# Patient Record
Sex: Female | Born: 1970 | Race: White | Hispanic: No | Marital: Single | Smoking: Never smoker
Health system: Southern US, Community
[De-identification: ages and names within clinical notes are randomized; demographics above are authoritative.]

## PROBLEM LIST (undated history)

## (undated) DIAGNOSIS — K5090 Crohn's disease, unspecified, without complications: Secondary | ICD-10-CM

## (undated) DIAGNOSIS — F329 Major depressive disorder, single episode, unspecified: Secondary | ICD-10-CM

## (undated) DIAGNOSIS — N179 Acute kidney failure, unspecified: Secondary | ICD-10-CM

## (undated) DIAGNOSIS — S8290XA Unspecified fracture of unspecified lower leg, initial encounter for closed fracture: Secondary | ICD-10-CM

## (undated) DIAGNOSIS — E039 Hypothyroidism, unspecified: Secondary | ICD-10-CM

## (undated) DIAGNOSIS — F209 Schizophrenia, unspecified: Secondary | ICD-10-CM

## (undated) DIAGNOSIS — S0101XA Laceration without foreign body of scalp, initial encounter: Secondary | ICD-10-CM

## (undated) DIAGNOSIS — I10 Essential (primary) hypertension: Secondary | ICD-10-CM

## (undated) DIAGNOSIS — E559 Vitamin D deficiency, unspecified: Secondary | ICD-10-CM

## (undated) DIAGNOSIS — E7220 Disorder of urea cycle metabolism, unspecified: Secondary | ICD-10-CM

## (undated) DIAGNOSIS — S069X9A Unspecified intracranial injury with loss of consciousness of unspecified duration, initial encounter: Secondary | ICD-10-CM

## (undated) DIAGNOSIS — E785 Hyperlipidemia, unspecified: Secondary | ICD-10-CM

## (undated) DIAGNOSIS — E28319 Asymptomatic premature menopause: Secondary | ICD-10-CM

## (undated) DIAGNOSIS — E079 Disorder of thyroid, unspecified: Secondary | ICD-10-CM

## (undated) DIAGNOSIS — Z1239 Encounter for other screening for malignant neoplasm of breast: Secondary | ICD-10-CM

## (undated) DIAGNOSIS — F203 Undifferentiated schizophrenia: Secondary | ICD-10-CM

## (undated) DIAGNOSIS — S2239XA Fracture of one rib, unspecified side, initial encounter for closed fracture: Secondary | ICD-10-CM

## (undated) DIAGNOSIS — F32A Depression, unspecified: Secondary | ICD-10-CM

## (undated) DIAGNOSIS — I959 Hypotension, unspecified: Secondary | ICD-10-CM

## (undated) DIAGNOSIS — G9340 Encephalopathy, unspecified: Secondary | ICD-10-CM

## (undated) DIAGNOSIS — L03119 Cellulitis of unspecified part of limb: Secondary | ICD-10-CM

## (undated) DIAGNOSIS — S069XAA Unspecified intracranial injury with loss of consciousness status unknown, initial encounter: Secondary | ICD-10-CM

## (undated) DIAGNOSIS — I9589 Other hypotension: Secondary | ICD-10-CM

## (undated) DIAGNOSIS — N6459 Other signs and symptoms in breast: Secondary | ICD-10-CM

## (undated) DIAGNOSIS — E669 Obesity, unspecified: Secondary | ICD-10-CM

## (undated) DIAGNOSIS — L0390 Cellulitis, unspecified: Secondary | ICD-10-CM

## (undated) DIAGNOSIS — H5789 Other specified disorders of eye and adnexa: Secondary | ICD-10-CM

## (undated) DIAGNOSIS — R928 Other abnormal and inconclusive findings on diagnostic imaging of breast: Secondary | ICD-10-CM

## (undated) DIAGNOSIS — R609 Edema, unspecified: Secondary | ICD-10-CM

## (undated) DIAGNOSIS — K219 Gastro-esophageal reflux disease without esophagitis: Secondary | ICD-10-CM

## (undated) DIAGNOSIS — K449 Diaphragmatic hernia without obstruction or gangrene: Secondary | ICD-10-CM

## (undated) DIAGNOSIS — E119 Type 2 diabetes mellitus without complications: Secondary | ICD-10-CM

## (undated) HISTORY — DX: Crohn's disease, unspecified, without complications: K50.90

## (undated) HISTORY — DX: Disorder of thyroid, unspecified: E07.9

## (undated) HISTORY — PX: TUMOR EXCISION: SHX421

## (undated) HISTORY — DX: Other abnormal and inconclusive findings on diagnostic imaging of breast: R92.8

## (undated) HISTORY — DX: Cellulitis, unspecified: L03.90

## (undated) HISTORY — DX: Depression, unspecified: F32.A

## (undated) HISTORY — DX: Encounter for other screening for malignant neoplasm of breast: Z12.39

## (undated) HISTORY — DX: Obesity, unspecified: E66.9

## (undated) HISTORY — DX: Acute kidney failure, unspecified: N17.9

## (undated) HISTORY — DX: Unspecified fracture of unspecified lower leg, initial encounter for closed fracture: S82.90XA

## (undated) HISTORY — DX: Encephalopathy, unspecified: G93.40

## (undated) HISTORY — DX: Gastro-esophageal reflux disease without esophagitis: K21.9

## (undated) HISTORY — DX: Diaphragmatic hernia without obstruction or gangrene: K44.9

## (undated) HISTORY — DX: Fracture of one rib, unspecified side, initial encounter for closed fracture: S22.39XA

## (undated) HISTORY — DX: Type 2 diabetes mellitus without complications: E11.9

## (undated) HISTORY — DX: Essential (primary) hypertension: I10

## (undated) HISTORY — DX: Edema, unspecified: R60.9

## (undated) HISTORY — DX: Disorder of urea cycle metabolism, unspecified: E72.20

## (undated) HISTORY — DX: Hyperlipidemia, unspecified: E78.5

## (undated) HISTORY — DX: Undifferentiated schizophrenia: F20.3

## (undated) HISTORY — DX: Laceration without foreign body of scalp, initial encounter: S01.01XA

## (undated) HISTORY — DX: Hypotension, unspecified: I95.9

## (undated) HISTORY — DX: Major depressive disorder, single episode, unspecified: F32.9

## (undated) HISTORY — DX: Other signs and symptoms in breast: N64.59

## (undated) HISTORY — DX: Other specified disorders of eye and adnexa: H57.89

## (undated) HISTORY — DX: Vitamin D deficiency, unspecified: E55.9

---
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
## 2011-05-11 ENCOUNTER — Ambulatory Visit: Payer: Medicare Other | Attending: Family Medicine | Admitting: Sleep Medicine

## 2011-05-11 DIAGNOSIS — Z6841 Body Mass Index (BMI) 40.0 and over, adult: Secondary | ICD-10-CM | POA: Insufficient documentation

## 2011-05-11 DIAGNOSIS — G4733 Obstructive sleep apnea (adult) (pediatric): Secondary | ICD-10-CM | POA: Insufficient documentation

## 2011-05-11 DIAGNOSIS — G4700 Insomnia, unspecified: Secondary | ICD-10-CM

## 2011-05-14 NOTE — Procedures (Signed)
NAMESHADELL, BRENN               ACCOUNT NO.:  [id number]  MEDICAL RECORD NO.:  [id number]          PATIENT TYPE:  OUT  LOCATION:  SLEEP LAB                     FACILITY:  [facility]  PHYSICIAN:  Anavey Coombes A. Gerilyn Pilgrim, M.D. DATE OF BIRTH:  [birth year]  DATE OF STUDY:  05/11/2011                           NOCTURNAL POLYSOMNOGRAM  REFERRING PHYSICIAN:  MARILYN WADE  CSN number [id number].  INDICATION FOR STUDY:  A 40 year old who presents with hypersomnia, obesity, and snoring.  Study has been done to evaluate for obstructive sleep apnea syndrome.  EPWORTH SLEEPINESS SCORE: 1. BMI 51.  MEDICATIONS:  Asacol, Celexa, Clozapine, Cymbalta, flax seed oil, levothyroxine, metformin, TriCor, simvastatin, spironolactone, Flagyl, potassium, vitamin B12 injections, furosemide, vitamin D, Atrovent, and ibuprofen.  SLEEP ARCHITECTURE:  The total recording time is 435 minutes, sleep efficiency 94% sleep latency 30  minutes.  REM latency 114 minutes. Stage N1 4.8%, N2 73.7%, N3 1.2%.  REM sleep 20%.  RESPIRATORY DATA:  Baseline oxygen saturation 96, lowest saturation 69 during REM sleep.  The diagnostic AH I is 12 and RDI 12 with the events occurring almost exclusively during REM sleep.  The REM AHI is 51.  CARDIAC DATA:  Average heart rate 84 with no significant dysrhythmias observed.  MOVEMENT-PARASOMNIA:  PLM index 3.2.  IMPRESSIONS-RECOMMENDATIONS:  Moderate obstructive sleep apnea syndrome.  Although the patient's index is in the mild to moderate range.  She has severe desaturations down to 69 and should be setup for formal CPAP titration study.  Thanks for this referral.     Jedediah Noda A. Gerilyn Pilgrim, M.D.    KAD/MEDQ  D:  05/13/2011 18:05:36  T:  05/14/2011 04:46:09  Job:  [id number]

## 2011-06-20 ENCOUNTER — Other Ambulatory Visit (HOSPITAL_COMMUNITY)
Admission: RE | Admit: 2011-06-20 | Discharge: 2011-06-20 | Disposition: A | Discharge status: Home / Self Care | Payer: Medicare Other | Source: Ambulatory Visit | Attending: Obstetrics & Gynecology | Admitting: Obstetrics & Gynecology

## 2011-06-20 DIAGNOSIS — Z124 Encounter for screening for malignant neoplasm of cervix: Secondary | ICD-10-CM | POA: Insufficient documentation

## 2011-11-01 DIAGNOSIS — E669 Obesity, unspecified: Secondary | ICD-10-CM

## 2011-11-01 DIAGNOSIS — R928 Other abnormal and inconclusive findings on diagnostic imaging of breast: Secondary | ICD-10-CM

## 2011-11-01 DIAGNOSIS — K5090 Crohn's disease, unspecified, without complications: Secondary | ICD-10-CM

## 2011-11-01 DIAGNOSIS — K449 Diaphragmatic hernia without obstruction or gangrene: Secondary | ICD-10-CM

## 2011-11-01 DIAGNOSIS — N6459 Other signs and symptoms in breast: Secondary | ICD-10-CM

## 2011-11-01 DIAGNOSIS — Z1239 Encounter for other screening for malignant neoplasm of breast: Secondary | ICD-10-CM

## 2011-11-01 HISTORY — DX: Crohn's disease, unspecified, without complications: K50.90

## 2011-11-01 HISTORY — PX: EYE SURGERY: SHX253

## 2011-11-01 HISTORY — DX: Diaphragmatic hernia without obstruction or gangrene: K44.9

## 2011-11-01 HISTORY — DX: Obesity, unspecified: E66.9

## 2011-11-01 HISTORY — DX: Other signs and symptoms in breast: N64.59

## 2011-11-01 HISTORY — DX: Encounter for other screening for malignant neoplasm of breast: Z12.39

## 2011-11-01 HISTORY — DX: Other abnormal and inconclusive findings on diagnostic imaging of breast: R92.8

## 2012-01-12 ENCOUNTER — Ambulatory Visit: Payer: Self-pay | Admitting: Ophthalmology

## 2012-01-12 LAB — HEMOGLOBIN: HGB: 12.6 g/dL (ref 12.0–16.0)

## 2012-01-23 ENCOUNTER — Ambulatory Visit: Payer: Self-pay | Admitting: Ophthalmology

## 2012-03-06 ENCOUNTER — Ambulatory Visit: Payer: Self-pay | Admitting: Geriatric Medicine

## 2012-03-07 ENCOUNTER — Other Ambulatory Visit: Payer: Self-pay | Admitting: Gastroenterology

## 2012-03-07 LAB — CLOSTRIDIUM DIFFICILE BY PCR

## 2012-03-09 ENCOUNTER — Ambulatory Visit: Payer: Self-pay | Admitting: Ophthalmology

## 2012-03-14 ENCOUNTER — Ambulatory Visit: Payer: Self-pay | Admitting: Gastroenterology

## 2012-03-15 ENCOUNTER — Inpatient Hospital Stay: Payer: Self-pay | Admitting: Internal Medicine

## 2012-03-15 LAB — CBC WITH DIFFERENTIAL/PLATELET
Basophil #: 0.2 10*3/uL — ABNORMAL HIGH (ref 0.0–0.1)
Basophil %: 0.8 %
Eosinophil #: 0 10*3/uL (ref 0.0–0.7)
HCT: 37.3 % (ref 35.0–47.0)
HGB: 11.2 g/dL — ABNORMAL LOW (ref 12.0–16.0)
Lymphocyte #: 6.1 10*3/uL — ABNORMAL HIGH (ref 1.0–3.6)
MCH: 26.9 pg (ref 26.0–34.0)
MCHC: 30.1 g/dL — ABNORMAL LOW (ref 32.0–36.0)
MCV: 89 fL (ref 80–100)
Monocyte #: 1.3 x10 3/mm — ABNORMAL HIGH (ref 0.2–0.9)
Monocyte %: 6.7 %
Neutrophil #: 11.8 10*3/uL — ABNORMAL HIGH (ref 1.4–6.5)
Platelet: 551 10*3/uL — ABNORMAL HIGH (ref 150–440)
RBC: 4.17 10*6/uL (ref 3.80–5.20)
RDW: 17 % — ABNORMAL HIGH (ref 11.5–14.5)
WBC: 19.3 10*3/uL — ABNORMAL HIGH (ref 3.6–11.0)

## 2012-03-15 LAB — COMPREHENSIVE METABOLIC PANEL
Albumin: 2.5 g/dL — ABNORMAL LOW (ref 3.4–5.0)
Anion Gap: 10 (ref 7–16)
BUN: 3 mg/dL — ABNORMAL LOW (ref 7–18)
EGFR (Non-African Amer.): 55 — ABNORMAL LOW
Glucose: 107 mg/dL — ABNORMAL HIGH (ref 65–99)
Osmolality: 276 (ref 275–301)
Potassium: 3.6 mmol/L (ref 3.5–5.1)
SGOT(AST): 35 U/L (ref 15–37)

## 2012-03-15 LAB — HEMOGLOBIN A1C: Hemoglobin A1C: 6.5 % — ABNORMAL HIGH (ref 4.2–6.3)

## 2012-03-15 LAB — TSH: Thyroid Stimulating Horm: 4.67 u[IU]/mL — ABNORMAL HIGH

## 2012-03-16 LAB — CBC WITH DIFFERENTIAL/PLATELET
Basophil %: 0.5 %
Eosinophil %: 0 %
HCT: 36.3 % (ref 35.0–47.0)
Lymphocyte #: 2.5 10*3/uL (ref 1.0–3.6)
MCH: 27.2 pg (ref 26.0–34.0)
MCHC: 30.4 g/dL — ABNORMAL LOW (ref 32.0–36.0)
Monocyte #: 0.2 x10 3/mm (ref 0.2–0.9)
Neutrophil #: 10.3 10*3/uL — ABNORMAL HIGH (ref 1.4–6.5)
Neutrophil %: 79 %
RBC: 4.04 10*6/uL (ref 3.80–5.20)
RDW: 16.8 % — ABNORMAL HIGH (ref 11.5–14.5)
WBC: 13 10*3/uL — ABNORMAL HIGH (ref 3.6–11.0)

## 2012-03-16 LAB — URINALYSIS, COMPLETE
Bilirubin,UR: NEGATIVE
Blood: NEGATIVE
Glucose,UR: NEGATIVE mg/dL (ref 0–75)
Ketone: NEGATIVE
Leukocyte Esterase: NEGATIVE
Nitrite: NEGATIVE
RBC,UR: NONE SEEN /HPF (ref 0–5)
Specific Gravity: 1.002 (ref 1.003–1.030)
Squamous Epithelial: 1
WBC UR: 1 /HPF (ref 0–5)

## 2012-03-16 LAB — BASIC METABOLIC PANEL
Anion Gap: 8 (ref 7–16)
BUN: 3 mg/dL — ABNORMAL LOW (ref 7–18)
Calcium, Total: 7.5 mg/dL — ABNORMAL LOW (ref 8.5–10.1)
Co2: 28 mmol/L (ref 21–32)
EGFR (African American): >60
Glucose: 136 mg/dL — ABNORMAL HIGH (ref 65–99)
Osmolality: 280 (ref 275–301)
Potassium: 3.7 mmol/L (ref 3.5–5.1)

## 2012-03-16 LAB — MAGNESIUM: Magnesium: 1.8 mg/dL

## 2012-03-17 LAB — BASIC METABOLIC PANEL
Anion Gap: 10 (ref 7–16)
Calcium, Total: 7.2 mg/dL — ABNORMAL LOW (ref 8.5–10.1)
Chloride: 112 mmol/L — ABNORMAL HIGH (ref 98–107)
Co2: 26 mmol/L (ref 21–32)
Creatinine: 0.86 mg/dL (ref 0.60–1.30)
EGFR (African American): >60
EGFR (Non-African Amer.): >60
Glucose: 154 mg/dL — ABNORMAL HIGH (ref 65–99)
Potassium: 3.5 mmol/L (ref 3.5–5.1)
Sodium: 148 mmol/L — ABNORMAL HIGH (ref 136–145)

## 2012-03-17 LAB — CBC WITH DIFFERENTIAL/PLATELET
Basophil %: 0.1 %
HCT: 32.6 % — ABNORMAL LOW (ref 35.0–47.0)
Lymphocyte #: 2.6 10*3/uL (ref 1.0–3.6)
Lymphocyte %: 18.5 %
MCH: 27.9 pg (ref 26.0–34.0)
MCHC: 31.5 g/dL — ABNORMAL LOW (ref 32.0–36.0)
MCV: 89 fL (ref 80–100)
Monocyte %: 3.7 %
Neutrophil #: 10.9 10*3/uL — ABNORMAL HIGH (ref 1.4–6.5)
Neutrophil %: 77.7 %
Platelet: 543 10*3/uL — ABNORMAL HIGH (ref 150–440)
RBC: 3.67 10*6/uL — ABNORMAL LOW (ref 3.80–5.20)
WBC: 14.1 10*3/uL — ABNORMAL HIGH (ref 3.6–11.0)

## 2012-03-18 LAB — CBC WITH DIFFERENTIAL/PLATELET
Basophil #: 0 10*3/uL (ref 0.0–0.1)
Basophil %: 0.1 %
Eosinophil #: 0 10*3/uL (ref 0.0–0.7)
Eosinophil %: 0 %
HGB: 10.8 g/dL — ABNORMAL LOW (ref 12.0–16.0)
Lymphocyte #: 3.4 10*3/uL (ref 1.0–3.6)
Lymphocyte %: 21.7 %
MCH: 28.5 pg (ref 26.0–34.0)
MCHC: 31.9 g/dL — ABNORMAL LOW (ref 32.0–36.0)
Monocyte #: 0.5 x10 3/mm (ref 0.2–0.9)
Monocyte %: 3.4 %
Neutrophil %: 74.8 %
RDW: 17 % — ABNORMAL HIGH (ref 11.5–14.5)
WBC: 15.5 10*3/uL — ABNORMAL HIGH (ref 3.6–11.0)

## 2012-03-19 LAB — BASIC METABOLIC PANEL
BUN: 13 mg/dL (ref 7–18)
Calcium, Total: 7.5 mg/dL — ABNORMAL LOW (ref 8.5–10.1)
Chloride: 112 mmol/L — ABNORMAL HIGH (ref 98–107)
Co2: 26 mmol/L (ref 21–32)
Creatinine: 0.99 mg/dL (ref 0.60–1.30)
EGFR (African American): >60
EGFR (Non-African Amer.): >60

## 2012-03-19 LAB — PATHOLOGY REPORT

## 2012-03-20 LAB — CBC WITH DIFFERENTIAL/PLATELET
Basophil %: 0 %
Eosinophil #: 0 10*3/uL (ref 0.0–0.7)
Eosinophil %: 0 %
HCT: 38 % (ref 35.0–47.0)
HGB: 11.7 g/dL — ABNORMAL LOW (ref 12.0–16.0)
Lymphocyte #: 5.2 10*3/uL — ABNORMAL HIGH (ref 1.0–3.6)
Lymphocyte %: 30.3 %
MCH: 27.5 pg (ref 26.0–34.0)
MCHC: 30.7 g/dL — ABNORMAL LOW (ref 32.0–36.0)
MCV: 90 fL (ref 80–100)
Monocyte %: 8.1 %
Neutrophil #: 10.5 10*3/uL — ABNORMAL HIGH (ref 1.4–6.5)
Platelet: 549 10*3/uL — ABNORMAL HIGH (ref 150–440)
RBC: 4.24 10*6/uL (ref 3.80–5.20)
WBC: 17 10*3/uL — ABNORMAL HIGH (ref 3.6–11.0)

## 2012-03-20 LAB — BASIC METABOLIC PANEL
BUN: 17 mg/dL (ref 7–18)
Chloride: 108 mmol/L — ABNORMAL HIGH (ref 98–107)
EGFR (Non-African Amer.): >60
Potassium: 3.9 mmol/L (ref 3.5–5.1)
Sodium: 144 mmol/L (ref 136–145)

## 2012-03-28 ENCOUNTER — Ambulatory Visit: Payer: Self-pay | Admitting: Geriatric Medicine

## 2012-05-24 ENCOUNTER — Ambulatory Visit: Payer: Self-pay | Admitting: Ophthalmology

## 2012-06-04 ENCOUNTER — Ambulatory Visit: Payer: Self-pay | Admitting: Ophthalmology

## 2012-07-27 ENCOUNTER — Inpatient Hospital Stay: Payer: Self-pay

## 2012-07-27 LAB — URINALYSIS, COMPLETE
Bilirubin,UR: NEGATIVE
Blood: NEGATIVE
Granular Cast: 179
Hyaline Cast: 39
Nitrite: NEGATIVE
Ph: 5 (ref 4.5–8.0)
Protein: 30
Specific Gravity: 1.025 (ref 1.003–1.030)
Squamous Epithelial: 8
WBC UR: 10 /HPF (ref 0–5)

## 2012-07-27 LAB — CBC
HCT: 42.8 % (ref 35.0–47.0)
HGB: 13.4 g/dL (ref 12.0–16.0)
MCV: 95 fL (ref 80–100)
RBC: 4.51 10*6/uL (ref 3.80–5.20)

## 2012-07-27 LAB — PRO B NATRIURETIC PEPTIDE: B-Type Natriuretic Peptide: 389 pg/mL — ABNORMAL HIGH (ref 0–125)

## 2012-07-27 LAB — COMPREHENSIVE METABOLIC PANEL
Anion Gap: 17 — ABNORMAL HIGH (ref 7–16)
Bilirubin,Total: 0.5 mg/dL (ref 0.2–1.0)
Calcium, Total: 9.5 mg/dL (ref 8.5–10.1)
Chloride: 104 mmol/L (ref 98–107)
Co2: 21 mmol/L (ref 21–32)
Creatinine: 1.63 mg/dL — ABNORMAL HIGH (ref 0.60–1.30)
EGFR (African American): 45 — ABNORMAL LOW
Osmolality: 282 (ref 275–301)
SGOT(AST): 34 U/L (ref 15–37)
SGPT (ALT): 33 U/L (ref 12–78)
Sodium: 142 mmol/L (ref 136–145)

## 2012-07-27 LAB — ACETAMINOPHEN LEVEL: Acetaminophen: <2 ug/mL

## 2012-07-27 LAB — DRUG SCREEN, URINE
Benzodiazepine, Ur Scrn: NEGATIVE (ref ?–200)
Cocaine Metabolite,Ur ~~LOC~~: NEGATIVE (ref ?–300)
Phencyclidine (PCP) Ur S: NEGATIVE (ref ?–25)
Tricyclic, Ur Screen: NEGATIVE (ref ?–1000)

## 2012-07-27 LAB — TROPONIN I: Troponin-I: <0.02 ng/mL

## 2012-07-27 LAB — ETHANOL: Ethanol: <3 mg/dL

## 2012-07-27 LAB — SALICYLATE LEVEL: Salicylates, Serum: <1.7 mg/dL

## 2012-07-28 LAB — COMPREHENSIVE METABOLIC PANEL
Alkaline Phosphatase: 56 U/L (ref 50–136)
Anion Gap: 13 (ref 7–16)
Calcium, Total: 8.5 mg/dL (ref 8.5–10.1)
Co2: 26 mmol/L (ref 21–32)
EGFR (Non-African Amer.): 47 — ABNORMAL LOW
Osmolality: 281 (ref 275–301)
Potassium: 3.6 mmol/L (ref 3.5–5.1)
SGPT (ALT): 25 U/L (ref 12–78)
Sodium: 142 mmol/L (ref 136–145)

## 2012-07-28 LAB — CBC WITH DIFFERENTIAL/PLATELET
Eosinophil #: 0.1 10*3/uL (ref 0.0–0.7)
Eosinophil %: 0.8 %
Lymphocyte #: 6.9 10*3/uL — ABNORMAL HIGH (ref 1.0–3.6)
Lymphocyte %: 47.2 %
MCH: 30.7 pg (ref 26.0–34.0)
MCV: 95 fL (ref 80–100)
Monocyte #: 1.4 x10 3/mm — ABNORMAL HIGH (ref 0.2–0.9)
Neutrophil #: 6.1 10*3/uL (ref 1.4–6.5)
Platelet: 416 10*3/uL (ref 150–440)
RDW: 14.4 % (ref 11.5–14.5)
WBC: 14.5 10*3/uL — ABNORMAL HIGH (ref 3.6–11.0)

## 2012-07-28 LAB — TROPONIN I: Troponin-I: <0.02 ng/mL

## 2012-07-29 LAB — BASIC METABOLIC PANEL
Chloride: 102 mmol/L (ref 98–107)
Co2: 27 mmol/L (ref 21–32)
Creatinine: 1.07 mg/dL (ref 0.60–1.30)
Potassium: 3.7 mmol/L (ref 3.5–5.1)
Sodium: 142 mmol/L (ref 136–145)

## 2012-07-29 LAB — CBC WITH DIFFERENTIAL/PLATELET
Eosinophil #: 0.1 10*3/uL (ref 0.0–0.7)
Eosinophil %: 0.6 %
MCH: 30.8 pg (ref 26.0–34.0)
MCHC: 32.4 g/dL (ref 32.0–36.0)
MCV: 95 fL (ref 80–100)
Neutrophil #: 6.1 10*3/uL (ref 1.4–6.5)
Neutrophil %: 51.2 %
Platelet: 403 10*3/uL (ref 150–440)
RDW: 14.3 % (ref 11.5–14.5)

## 2012-07-30 LAB — CBC WITH DIFFERENTIAL/PLATELET
Basophil #: 0.1 10*3/uL (ref 0.0–0.1)
Basophil %: 0.6 %
Eosinophil #: 0.1 10*3/uL (ref 0.0–0.7)
HCT: 36.6 % (ref 35.0–47.0)
HGB: 11.7 g/dL — ABNORMAL LOW (ref 12.0–16.0)
Lymphocyte #: 4.8 10*3/uL — ABNORMAL HIGH (ref 1.0–3.6)
Lymphocyte %: 45.5 %
MCH: 30.4 pg (ref 26.0–34.0)
MCHC: 32 g/dL (ref 32.0–36.0)
Neutrophil #: 4.6 10*3/uL (ref 1.4–6.5)
Neutrophil %: 43.7 %
RDW: 14.1 % (ref 11.5–14.5)

## 2012-07-30 LAB — BASIC METABOLIC PANEL
Anion Gap: 15 (ref 7–16)
BUN: 4 mg/dL — ABNORMAL LOW (ref 7–18)
Calcium, Total: 9 mg/dL (ref 8.5–10.1)
Co2: 26 mmol/L (ref 21–32)
Creatinine: 1.11 mg/dL (ref 0.60–1.30)
EGFR (African American): >60
Potassium: 3.6 mmol/L (ref 3.5–5.1)
Sodium: 140 mmol/L (ref 136–145)

## 2012-07-31 LAB — CBC WITH DIFFERENTIAL/PLATELET
Basophil #: 0.1 10*3/uL (ref 0.0–0.1)
Basophil %: 0.8 %
Eosinophil #: 0.1 10*3/uL (ref 0.0–0.7)
HCT: 34.8 % — ABNORMAL LOW (ref 35.0–47.0)
Lymphocyte #: 4.4 10*3/uL — ABNORMAL HIGH (ref 1.0–3.6)
MCH: 31.8 pg (ref 26.0–34.0)
MCHC: 33.6 g/dL (ref 32.0–36.0)
MCV: 95 fL (ref 80–100)
Monocyte #: 0.9 x10 3/mm (ref 0.2–0.9)
Monocyte %: 8.6 %
Neutrophil #: 5.3 10*3/uL (ref 1.4–6.5)
RDW: 13.6 % (ref 11.5–14.5)
WBC: 10.9 10*3/uL (ref 3.6–11.0)

## 2012-07-31 LAB — BASIC METABOLIC PANEL
BUN: 3 mg/dL — ABNORMAL LOW (ref 7–18)
Calcium, Total: 9.2 mg/dL (ref 8.5–10.1)
Chloride: 102 mmol/L (ref 98–107)
Creatinine: 0.98 mg/dL (ref 0.60–1.30)
EGFR (African American): >60
EGFR (Non-African Amer.): >60
Glucose: 73 mg/dL (ref 65–99)
Potassium: 3.5 mmol/L (ref 3.5–5.1)

## 2012-08-01 LAB — WOUND AEROBIC CULTURE

## 2012-08-02 LAB — CULTURE, BLOOD (SINGLE)

## 2012-08-04 LAB — BASIC METABOLIC PANEL
Anion Gap: 13 (ref 7–16)
BUN: 3 mg/dL — ABNORMAL LOW (ref 7–18)
Calcium, Total: 8.7 mg/dL (ref 8.5–10.1)
Chloride: 101 mmol/L (ref 98–107)
Co2: 28 mmol/L (ref 21–32)
Creatinine: 1.09 mg/dL (ref 0.60–1.30)
EGFR (African American): >60
EGFR (Non-African Amer.): >60
Glucose: 83 mg/dL (ref 65–99)
Osmolality: 279 (ref 275–301)
Potassium: 3.2 mmol/L — ABNORMAL LOW (ref 3.5–5.1)
Sodium: 142 mmol/L (ref 136–145)

## 2012-08-05 LAB — CREATININE, SERUM
Creatinine: 1.15 mg/dL (ref 0.60–1.30)
EGFR (African American): >60
EGFR (Non-African Amer.): 59 — ABNORMAL LOW

## 2012-08-06 LAB — BASIC METABOLIC PANEL
Anion Gap: 9 (ref 7–16)
BUN: 2 mg/dL — ABNORMAL LOW (ref 7–18)
Calcium, Total: 9.8 mg/dL (ref 8.5–10.1)
Chloride: 102 mmol/L (ref 98–107)
Co2: 31 mmol/L (ref 21–32)
EGFR (African American): >60
EGFR (Non-African Amer.): >60
Osmolality: 280 (ref 275–301)
Potassium: 3.3 mmol/L — ABNORMAL LOW (ref 3.5–5.1)

## 2012-08-07 LAB — DIFFERENTIAL
Lymphocytes: 33 %
Variant Lymphocyte - H1-Rlymph: 34 %

## 2012-08-07 LAB — WBC: WBC: 14.4 10*3/uL — ABNORMAL HIGH (ref 3.6–11.0)

## 2012-08-08 LAB — CBC WITH DIFFERENTIAL/PLATELET
Basophil #: 0.3 10*3/uL — ABNORMAL HIGH (ref 0.0–0.1)
Basophil %: 2.7 %
Eosinophil #: 0.4 10*3/uL (ref 0.0–0.7)
Eosinophil %: 3.1 %
HCT: 34.9 % — ABNORMAL LOW (ref 35.0–47.0)
HGB: 11.5 g/dL — ABNORMAL LOW (ref 12.0–16.0)
MCH: 30.8 pg (ref 26.0–34.0)
MCHC: 32.8 g/dL (ref 32.0–36.0)
MCV: 94 fL (ref 80–100)
Monocyte #: 1.1 x10 3/mm — ABNORMAL HIGH (ref 0.2–0.9)
Neutrophil %: 40.2 %
RBC: 3.73 10*6/uL — ABNORMAL LOW (ref 3.80–5.20)
RDW: 14.1 % (ref 11.5–14.5)

## 2012-08-08 LAB — BASIC METABOLIC PANEL
Anion Gap: 7 (ref 7–16)
Calcium, Total: 9.4 mg/dL (ref 8.5–10.1)
Chloride: 100 mmol/L (ref 98–107)
Co2: 34 mmol/L — ABNORMAL HIGH (ref 21–32)
Creatinine: 0.97 mg/dL (ref 0.60–1.30)
EGFR (African American): >60
EGFR (Non-African Amer.): >60
Glucose: 101 mg/dL — ABNORMAL HIGH (ref 65–99)
Osmolality: 278 (ref 275–301)
Potassium: 3.5 mmol/L (ref 3.5–5.1)
Sodium: 141 mmol/L (ref 136–145)

## 2012-08-12 LAB — CBC WITH DIFFERENTIAL/PLATELET
Eosinophil %: 1.5 %
HCT: 34.9 % — ABNORMAL LOW (ref 35.0–47.0)
HGB: 11.1 g/dL — ABNORMAL LOW (ref 12.0–16.0)
Lymphocyte #: 5.7 10*3/uL — ABNORMAL HIGH (ref 1.0–3.6)
Lymphocyte %: 45.7 %
MCH: 29.6 pg (ref 26.0–34.0)
MCV: 93 fL (ref 80–100)
Monocyte %: 11.2 %
Neutrophil #: 5 10*3/uL (ref 1.4–6.5)
Platelet: 489 10*3/uL — ABNORMAL HIGH (ref 150–440)
RBC: 3.75 10*6/uL — ABNORMAL LOW (ref 3.80–5.20)
WBC: 12.4 10*3/uL — ABNORMAL HIGH (ref 3.6–11.0)

## 2012-08-12 LAB — BASIC METABOLIC PANEL
Anion Gap: 10 (ref 7–16)
Calcium, Total: 9.3 mg/dL (ref 8.5–10.1)
Chloride: 98 mmol/L (ref 98–107)
Co2: 32 mmol/L (ref 21–32)
EGFR (African American): >60
Glucose: 92 mg/dL (ref 65–99)
Potassium: 3.1 mmol/L — ABNORMAL LOW (ref 3.5–5.1)
Sodium: 140 mmol/L (ref 136–145)

## 2012-08-13 LAB — BASIC METABOLIC PANEL
Anion Gap: 10 (ref 7–16)
BUN: 3 mg/dL — ABNORMAL LOW (ref 7–18)
Chloride: 102 mmol/L (ref 98–107)
Creatinine: 0.82 mg/dL (ref 0.60–1.30)
EGFR (African American): >60
Glucose: 109 mg/dL — ABNORMAL HIGH (ref 65–99)
Osmolality: 282 (ref 275–301)

## 2012-08-23 ENCOUNTER — Other Ambulatory Visit: Payer: Self-pay | Admitting: Geriatric Medicine

## 2012-08-23 LAB — CBC WITH DIFFERENTIAL/PLATELET
Basophil #: 0.2 10*3/uL — ABNORMAL HIGH (ref 0.0–0.1)
Basophil %: 1.2 %
Eosinophil %: 1.5 %
HGB: 11.3 g/dL — ABNORMAL LOW (ref 12.0–16.0)
Lymphocyte #: 6.4 10*3/uL — ABNORMAL HIGH (ref 1.0–3.6)
Lymphocyte %: 48.2 %
MCH: 29.5 pg (ref 26.0–34.0)
MCV: 92 fL (ref 80–100)
Monocyte #: 1.3 x10 3/mm — ABNORMAL HIGH (ref 0.2–0.9)
Neutrophil %: 39.6 %
RBC: 3.82 10*6/uL (ref 3.80–5.20)

## 2012-09-18 ENCOUNTER — Ambulatory Visit: Payer: Self-pay | Admitting: General Surgery

## 2012-10-25 ENCOUNTER — Other Ambulatory Visit: Payer: Self-pay | Admitting: Geriatric Medicine

## 2012-10-25 LAB — CBC WITH DIFFERENTIAL/PLATELET
Basophil #: 0.3 10*3/uL — ABNORMAL HIGH (ref 0.0–0.1)
Eosinophil #: 0.2 10*3/uL (ref 0.0–0.7)
HCT: 42.3 % (ref 35.0–47.0)
HGB: 13.4 g/dL (ref 12.0–16.0)
Lymphocyte #: 7.2 10*3/uL — ABNORMAL HIGH (ref 1.0–3.6)
Lymphocyte %: 52.7 %
MCV: 89 fL (ref 80–100)
Monocyte %: 6.8 %
Neutrophil #: 5 10*3/uL (ref 1.4–6.5)
Neutrophil %: 36.4 %
Platelet: 370 10*3/uL (ref 150–440)
RDW: 16.3 % — ABNORMAL HIGH (ref 11.5–14.5)

## 2012-11-22 ENCOUNTER — Other Ambulatory Visit: Payer: Self-pay | Admitting: Geriatric Medicine

## 2012-11-22 LAB — CBC WITH DIFFERENTIAL/PLATELET
Comment - H1-Com2: NORMAL
Eosinophil: 1 %
MCH: 29.9 pg (ref 26.0–34.0)
MCHC: 33.1 g/dL (ref 32.0–36.0)
Monocytes: 8 %
RBC: 4.7 10*6/uL (ref 3.80–5.20)
RDW: 16 % — ABNORMAL HIGH (ref 11.5–14.5)
Variant Lymphocyte - H1-Rlymph: 27 %

## 2012-11-23 ENCOUNTER — Ambulatory Visit: Payer: Self-pay | Admitting: Hematology and Oncology

## 2012-11-23 LAB — COMPREHENSIVE METABOLIC PANEL
Alkaline Phosphatase: 186 U/L — ABNORMAL HIGH (ref 50–136)
BUN: 14 mg/dL (ref 7–18)
Bilirubin,Total: 0.5 mg/dL (ref 0.2–1.0)
Co2: 33 mmol/L — ABNORMAL HIGH (ref 21–32)
Creatinine: 1.16 mg/dL (ref 0.60–1.30)
EGFR (Non-African Amer.): 58 — ABNORMAL LOW
Glucose: 138 mg/dL — ABNORMAL HIGH (ref 65–99)
SGOT(AST): 35 U/L (ref 15–37)
Sodium: 138 mmol/L (ref 136–145)

## 2012-11-23 LAB — CBC CANCER CENTER
Basophil %: 0.9 %
HCT: 42.4 % (ref 35.0–47.0)
MCH: 29.5 pg (ref 26.0–34.0)
MCHC: 33.1 g/dL (ref 32.0–36.0)
Platelet: 343 x10 3/mm (ref 150–440)
RDW: 16 % — ABNORMAL HIGH (ref 11.5–14.5)

## 2012-12-01 ENCOUNTER — Ambulatory Visit: Payer: Self-pay | Admitting: Hematology and Oncology

## 2012-12-11 ENCOUNTER — Ambulatory Visit: Payer: Self-pay | Admitting: Gastroenterology

## 2012-12-12 LAB — PATHOLOGY REPORT

## 2012-12-29 ENCOUNTER — Ambulatory Visit: Payer: Self-pay | Admitting: Hematology and Oncology

## 2013-01-23 ENCOUNTER — Ambulatory Visit: Payer: Self-pay | Admitting: Hematology and Oncology

## 2013-01-23 LAB — CBC CANCER CENTER
Basophil #: 0.2 x10 3/mm — ABNORMAL HIGH (ref 0.0–0.1)
Basophil %: 1.3 %
Eosinophil #: 0.2 x10 3/mm (ref 0.0–0.7)
HCT: 41.1 % (ref 35.0–47.0)
MCH: 30.5 pg (ref 26.0–34.0)
MCV: 91 fL (ref 80–100)
Monocyte %: 7.8 %
Neutrophil #: 6 x10 3/mm (ref 1.4–6.5)
Neutrophil %: 39.8 %
RBC: 4.5 10*6/uL (ref 3.80–5.20)

## 2013-01-24 ENCOUNTER — Encounter: Payer: Self-pay | Admitting: General Surgery

## 2013-01-29 ENCOUNTER — Ambulatory Visit: Payer: Self-pay | Admitting: Hematology and Oncology

## 2013-02-25 ENCOUNTER — Encounter: Payer: Self-pay | Admitting: *Deleted

## 2013-02-25 DIAGNOSIS — N6459 Other signs and symptoms in breast: Secondary | ICD-10-CM | POA: Insufficient documentation

## 2013-03-19 ENCOUNTER — Ambulatory Visit: Payer: Self-pay | Admitting: General Surgery

## 2013-03-20 ENCOUNTER — Encounter: Payer: Self-pay | Admitting: General Surgery

## 2013-03-28 ENCOUNTER — Ambulatory Visit: Payer: Self-pay | Admitting: General Surgery

## 2013-05-07 ENCOUNTER — Ambulatory Visit: Payer: Self-pay | Admitting: General Surgery

## 2013-05-23 ENCOUNTER — Ambulatory Visit (INDEPENDENT_AMBULATORY_CARE_PROVIDER_SITE_OTHER): Payer: Medicare Other | Admitting: General Surgery

## 2013-05-23 ENCOUNTER — Encounter: Payer: Self-pay | Admitting: General Surgery

## 2013-05-23 ENCOUNTER — Other Ambulatory Visit: Payer: Self-pay

## 2013-05-23 VITALS — BP 138/70 | HR 74 | Resp 14 | Ht 66.0 in | Wt 288.0 lb

## 2013-05-23 DIAGNOSIS — R928 Other abnormal and inconclusive findings on diagnostic imaging of breast: Secondary | ICD-10-CM | POA: Insufficient documentation

## 2013-05-23 NOTE — Patient Instructions (Addendum)
Patient to return in 6 months with right breast diagnostic mammogram.  

## 2013-05-23 NOTE — Progress Notes (Signed)
aPatient ID: [name], female   DOB: [birth year], 42 y.o.   MRN: [id number]  Chief Complaint  Patient presents with  . Follow-up    mammogram    HPI [name] is a 42 y.o. female here today following up from her mammogram 03/23/13 cat 4. The patient gets regular mammograms done and does self breast checks every now and then. She denies any new problems with her breasts at this time.  The patient was evaluated in 2013 in regards to an abnormal right breast mammogram. November 2013 films suggested a modest increase in size. HPI  Past Medical History  Diagnosis Date  . Crohn's disease 2013  . Depression   . Diabetes mellitus without complication     non insulin dependent  . Hiatal hernia 2013  . Thyroid disease     hypothyroid  . Broken leg     age 31  . Edema   . Abnormal mammogram, unspecified 2013    Prev. cytology,hypercellular smears without evidence of malignant cells. The cytopathologist questioned if samples truly representative. Care taken during sampling and is felt to be representative.  . Breast screening, unspecified 2013  . Other sign and symptom in breast 2013    Right bst US,lower outer quadrant,A single 0.3x0.4x0.6cm hypoechoic mass with slightly lobulated borders with adjacent 0.3x0.4x0.5cm mass was noted. The 1st was 5cm from nipple, 2nd at 8 cm from the nipple. The previous lesion aspirate was at 3 0'clock position. These lesions are thought to account for the mammographic abnormality. Minimal interval change on [place].   Marland Kitchen Obesity, unspecified 2013  . Rib fracture     age 72  . Hyperlipidemia   . GERD (gastroesophageal reflux disease)   . 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 Hypertension   . Vitamin D deficiency   . Regional enteritis   . Cellulitis     Past Surgical History  Procedure Laterality Date  . Tumor excision Right     age of 41, tumor removed from [place]  . Eye surgery Left 2013    cataract surgery    Family History   Problem Relation Age of Onset  . Cancer Mother 30    colon  . Cancer Paternal Aunt     Social History History  Substance Use Topics  . Smoking status: Never Smoker   . Smokeless tobacco: Not on file  . Alcohol Use: No    No Known Allergies  Current Outpatient Prescriptions  Medication Sig Dispense Refill  . albuterol (VENTOLIN HFA) 108 (90 BASE) MCG/ACT inhaler Inhale 1 puff into the lungs every 6 (six) hours as needed for wheezing.       . citalopram (CELEXA) 40 MG tablet Take 40 mg by mouth daily.      . clotrimazole (LOTRIMIN) 1 % cream Apply 1 application topically as directed.      . cloZAPine (CLOZARIL) 100 MG tablet Take 150 mg by mouth at bedtime. 1.5 tablets      . cloZAPine (CLOZARIL) 100 MG tablet Take 150 mg by mouth daily. Take each morning.      . diphenhydrAMINE (BENADRYL) 25 MG tablet Take 25 mg by mouth at bedtime.      . furosemide (LASIX) 20 MG tablet Take 40 mg by mouth daily.       . Mesalamine (ASACOL HD PO) Take 1 tablet by mouth 3 (three) times daily with meals.      . metFORMIN (GLUCOPHAGE) 1000  MG tablet Take 1,000 mg by mouth daily.       . pantoprazole (PROTONIX) 40 MG tablet Take 40 mg by mouth daily. Each morning.      Marland Kitchen POTASSIUM BICARBONATE PO Take 20 mEq by mouth 2 (two) times daily.       . risperiDONE (RISPERDAL) 1 MG tablet Take 1 mg by mouth 2 (two) times daily.      . simvastatin (ZOCOR) 40 MG tablet Take 40 mg by mouth at bedtime.      Marland Kitchen spironolactone (ALDACTONE) 50 MG tablet Take 50 mg by mouth daily.      . vitamin B-12 (CYANOCOBALAMIN) 1000 MCG tablet Take 1,000 mcg by mouth daily.      Marland Kitchen adalimumab (HUMIRA PEN) 40 MG/0.8ML injection Inject 40 mg into the skin as directed.      . ergocalciferol (DRISDOL) 50000 UNITS capsule Take 50,000 Units by mouth every 30 (thirty) days.      . fenofibrate (TRICOR) 145 MG tablet Take 145 mg by mouth daily.      Marland Kitchen ketoconazole (NIZORAL) 2 % cream Apply 1 application topically as directed.      Marland Kitchen  levothyroxine (SYNTHROID, LEVOTHROID) 75 MCG tablet Take 75 mcg by mouth daily before breakfast.      . medroxyPROGESTERone (PROVERA) 10 MG tablet Take 10 mg by mouth daily.      . Mesalamine (DELZICOL) 400 MG CPDR Take 400 mg by mouth 2 (two) times daily.      . Multiple Vitamins-Minerals (THEREMS M PO) Take 1 tablet by mouth daily.      . Omega-3 Fatty Acids (FISH OIL) 1000 MG CAPS Take 2 capsules by mouth daily.      . predniSONE (DELTASONE) 20 MG tablet Take 60 mg by mouth daily.      . simethicone (MYLICON) 80 MG chewable tablet Chew 80 mg by mouth as needed for flatulence.       No current facility-administered medications for this visit.    Review of Systems Review of Systems  Constitutional: Negative.   Respiratory: Negative.   Cardiovascular: Negative.     Blood pressure 138/70, pulse 74, resp. rate 14, height 5\' 6"  (1.676 m), weight 288 lb (130.636 kg).  Physical Exam Physical Exam  Constitutional: She is oriented to person, place, and time. She appears well-developed and well-nourished.  Neck: No thyromegaly present.  Cardiovascular: Normal rate, regular rhythm and normal heart sounds.   No murmur heard. Pulmonary/Chest: Effort normal and breath sounds normal. Right breast exhibits no inverted nipple, no mass, no nipple discharge, no skin change and no tenderness. Left breast exhibits no inverted nipple, no mass, no nipple discharge, no skin change and no tenderness.  Lymphadenopathy:    She has no cervical adenopathy.    She has axillary adenopathy.       Right axillary: Lateral adenopathy present.       Left axillary: Lateral adenopathy present.  Bilateral axillary adenopathy is appreciated with soft nodes without tenderness, fixation or suppuration appreciated in the lower aspect of the axilla bilaterally.  Neurological: She is alert and oriented to person, place, and time.  Skin: Skin is warm and dry.    Data Reviewed Bilateral mammograms dated Mar 19, 2013 again  showed a small mass in the right breast similar to prior exams, but indeterminant. BI-RAD-4.  Ultrasound examination of the axilla on the right showed several mildly enlarged nodes measuring up to 2.4 cm in diameter. Normal architecture was appreciated. No increased vascular  flow on duplex imaging.  Examination of the right breast in the o'clock position 5 cm from the nipple showed a small hypoechoic nodule measuring 0.4 x 0.5 x 0.6 cm with faint acoustic shadowing. At the 8:00 position of the right breast 8 cm from the nipple a small 0.4 x 0.44 x 0.5 cm nodule was appreciated. These are both stable from past exams.  Assessment    Stable nodularity in the right breast.  Bilateral axillary adenopathy, questionable clinical significance.     Plan    Bilateral diagnostic mammograms will be obtained in 6 months. The patient will followup with her primary care provider in regards to her lymphadenopathy.        Earline Mayotte 05/26/2013, 8:44 PM

## 2013-05-26 ENCOUNTER — Encounter: Payer: Self-pay | Admitting: General Surgery

## 2013-11-14 ENCOUNTER — Ambulatory Visit: Payer: Self-pay | Admitting: General Surgery

## 2013-11-15 ENCOUNTER — Encounter: Payer: Self-pay | Admitting: General Surgery

## 2013-11-25 ENCOUNTER — Ambulatory Visit (INDEPENDENT_AMBULATORY_CARE_PROVIDER_SITE_OTHER): Payer: Medicare Other | Admitting: General Surgery

## 2013-11-25 ENCOUNTER — Encounter: Payer: Self-pay | Admitting: General Surgery

## 2013-11-25 VITALS — BP 140/74 | HR 78 | Resp 14 | Ht 66.0 in | Wt 274.0 lb

## 2013-11-25 DIAGNOSIS — R928 Other abnormal and inconclusive findings on diagnostic imaging of breast: Secondary | ICD-10-CM

## 2013-11-25 NOTE — Patient Instructions (Signed)
Patient to return in six months bilateral screening mammogram.

## 2013-11-25 NOTE — Progress Notes (Signed)
Patient ID: [name], female   DOB: 07/30/1971, 43 y.o.   MRN: [id number]  Chief Complaint  Patient presents with  . Follow-up    mammogram    HPI [name] is a 43 y.o. female who presents for a breast evaluation. The most recent right breast  mammogram was done on 11/14/13.  Patient does perform regular self breast checks and gets regular mammograms done.    HPI  Past Medical History  Diagnosis Date  . Crohn's disease 2013  . Depression   . Diabetes mellitus without complication     non insulin dependent  . Hiatal hernia 2013  . Thyroid disease     hypothyroid  . Broken leg     age 32  . Edema   . Abnormal mammogram, unspecified 2013    Prev. cytology,hypercellular smears without evidence of malignant cells. The cytopathologist questioned if samples truly representative. Care taken during sampling and is felt to be representative.  . Breast screening, unspecified 2013  . Other sign and symptom in breast 2013    Right bst US,lower outer quadrant,A single 0.3x0.4x0.6cm hypoechoic mass with slightly lobulated borders with adjacent 0.3x0.4x0.5cm mass was noted. The 1st was 5cm from nipple, 2nd at 8 cm from the nipple. The previous lesion aspirate was at 3 0'clock position. These lesions are thought to account for the mammographic abnormality. Minimal interval change on [place].   Marland Kitchen Obesity, unspecified 2013  . Rib fracture     age 6  . Hyperlipidemia   . GERD (gastroesophageal reflux disease)   . 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 Hypertension   . Vitamin D deficiency   . Regional enteritis   . Cellulitis     Past Surgical History  Procedure Laterality Date  . Tumor excision Right     age of 34, tumor removed from [place]  . Eye surgery Left 2013    cataract surgery    Family History  Problem Relation Age of Onset  . Cancer Mother 54    colon  . Cancer Paternal Aunt     Social History History  Substance Use Topics  . Smoking status:  Never Smoker   . Smokeless tobacco: Not on file  . Alcohol Use: No    No Known Allergies  Current Outpatient Prescriptions  Medication Sig Dispense Refill  . adalimumab (HUMIRA PEN) 40 MG/0.8ML injection Inject 40 mg into the skin as directed.      Marland Kitchen albuterol (VENTOLIN HFA) 108 (90 BASE) MCG/ACT inhaler Inhale 1 puff into the lungs every 6 (six) hours as needed for wheezing.       . citalopram (CELEXA) 40 MG tablet Take 40 mg by mouth daily.      . clotrimazole (LOTRIMIN) 1 % cream Apply 1 application topically as directed.      . cloZAPine (CLOZARIL) 100 MG tablet Take 150 mg by mouth at bedtime. 1.5 tablets      . cloZAPine (CLOZARIL) 100 MG tablet Take 150 mg by mouth daily. Take each morning.      . diphenhydrAMINE (BENADRYL) 25 MG tablet Take 25 mg by mouth at bedtime.      . ergocalciferol (DRISDOL) 50000 UNITS capsule Take 50,000 Units by mouth every 30 (thirty) days.      . fenofibrate (TRICOR) 145 MG tablet Take 145 mg by mouth daily.      . furosemide (LASIX) 20 MG tablet Take 40 mg by mouth daily.       Marland Kitchen  ketoconazole (NIZORAL) 2 % cream Apply 1 application topically as directed.      Marland Kitchen levothyroxine (SYNTHROID, LEVOTHROID) 75 MCG tablet Take 75 mcg by mouth daily before breakfast.      . medroxyPROGESTERone (PROVERA) 10 MG tablet Take 10 mg by mouth daily.      . Mesalamine (ASACOL HD PO) Take 1 tablet by mouth 3 (three) times daily with meals.      . Mesalamine (DELZICOL) 400 MG CPDR Take 400 mg by mouth 2 (two) times daily.      . metFORMIN (GLUCOPHAGE) 1000 MG tablet Take 1,000 mg by mouth daily.       . Multiple Vitamins-Minerals (THEREMS M PO) Take 1 tablet by mouth daily.      . Omega-3 Fatty Acids (FISH OIL) 1000 MG CAPS Take 2 capsules by mouth daily.      . pantoprazole (PROTONIX) 40 MG tablet Take 40 mg by mouth daily. Each morning.      Marland Kitchen POTASSIUM BICARBONATE PO Take 20 mEq by mouth 2 (two) times daily.       . predniSONE (DELTASONE) 20 MG tablet Take 60 mg by  mouth daily.      . risperiDONE (RISPERDAL) 1 MG tablet Take 1 mg by mouth 2 (two) times daily.      . simethicone (MYLICON) 80 MG chewable tablet Chew 80 mg by mouth as needed for flatulence.      . simvastatin (ZOCOR) 40 MG tablet Take 40 mg by mouth at bedtime.      Marland Kitchen spironolactone (ALDACTONE) 50 MG tablet Take 50 mg by mouth daily.      . vitamin B-12 (CYANOCOBALAMIN) 1000 MCG tablet Take 1,000 mcg by mouth daily.       No current facility-administered medications for this visit.    Review of Systems Review of Systems  Constitutional: Negative.   Respiratory: Negative.   Cardiovascular: Negative.     Blood pressure 140/74, pulse 78, resp. rate 14, height 5\' 6"  (1.676 m), weight 274 lb (124.286 kg).  Physical Exam Physical Exam  Constitutional: She is oriented to person, place, and time. She appears well-developed and well-nourished.  Eyes: Conjunctivae are normal.  Neck: Neck supple.  Cardiovascular: Normal rate, regular rhythm and normal heart sounds.   Pulmonary/Chest: Effort normal and breath sounds normal. Right breast exhibits no inverted nipple, no mass, no nipple discharge, no skin change and no tenderness. Left breast exhibits no inverted nipple, no mass, no nipple discharge, no skin change and no tenderness.  Lymphadenopathy:    She has no cervical adenopathy.    She has no axillary adenopathy.  Neurological: She is alert and oriented to person, place, and time.  Skin: Skin is warm and dry.    Data Reviewed Right breast mammogram dated 11/14/2013 was reviewed and compared to previous studies. No significant interval change. BI-RAD-3.  Assessment    Benign breast exam. Multiple stable nodules of the right breast.    Plan    Will plan for a final exam in 6 months with bilateral diagnostic mammograms prior to that visit.       Robert Bellow 11/25/2013, 11:02 AM

## 2014-05-12 ENCOUNTER — Encounter: Payer: Self-pay | Admitting: General Surgery

## 2014-06-04 ENCOUNTER — Ambulatory Visit: Payer: Medicare Other | Admitting: General Surgery

## 2014-06-20 ENCOUNTER — Encounter: Payer: Self-pay | Admitting: General Surgery

## 2014-07-01 ENCOUNTER — Ambulatory Visit (INDEPENDENT_AMBULATORY_CARE_PROVIDER_SITE_OTHER): Payer: Medicare Other | Admitting: General Surgery

## 2014-07-01 ENCOUNTER — Encounter: Payer: Self-pay | Admitting: General Surgery

## 2014-07-01 VITALS — BP 126/88 | HR 80 | Resp 14 | Ht 66.0 in | Wt 262.0 lb

## 2014-07-01 DIAGNOSIS — Z1239 Encounter for other screening for malignant neoplasm of breast: Secondary | ICD-10-CM

## 2014-07-01 NOTE — Progress Notes (Signed)
Patient ID: [name], female   DOB: 11/07/1970, 43 y.o.   MRN: [id number]  Chief Complaint  Patient presents with  . Follow-up    mammogram    HPI [name] is a 43 y.o. female who presents for a breast evaluation. The most recent mammogram was done on 06/20/14. Patient does perform regular self breast checks and gets regular mammograms done. The patient denies any problems at tAllhis time.    HPI  Past Medical History  Diagnosis Date  . Crohn's disease 2013  . Depression   . Diabetes mellitus without complication     non insulin dependent  . Hiatal hernia 2013  . Thyroid disease     hypothyroid  . Broken leg     age 68  . Edema   . Abnormal mammogram, unspecified 2013    Prev. cytology,hypercellular smears without evidence of malignant cells. The cytopathologist questioned if samples truly representative. Care taken during sampling and is felt to be representative.  . Breast screening, unspecified 2013  . Other sign and symptom in breast 2013    Right bst US,lower outer quadrant,A single 0.3x0.4x0.6cm hypoechoic mass with slightly lobulated borders with adjacent 0.3x0.4x0.5cm mass was noted. The 1st was 5cm from nipple, 2nd at 8 cm from the nipple. The previous lesion aspirate was at 3 0'clock position. These lesions are thought to account for the mammographic abnormality. Minimal interval change on [place].   Marland Kitchen Obesity, unspecified 2013  . Rib fracture     age 32  . Hyperlipidemia   . GERD (gastroesophageal reflux disease)   . 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 Hypertension   . Vitamin D deficiency   . Regional enteritis   . Cellulitis     Past Surgical History  Procedure Laterality Date  . Tumor excision Right     age of 74, tumor removed from [place]  . Eye surgery Left 2013    cataract surgery    Family History  Problem Relation Age of Onset  . Cancer Mother 38    colon  . Cancer Paternal Aunt     Social History History   Substance Use Topics  . Smoking status: Never Smoker   . Smokeless tobacco: Not on file  . Alcohol Use: No    Allergies  Allergen Reactions  . Peanut Oil Other (See Comments)    Face turns red  . Risperidone And Related Cough    Current Outpatient Prescriptions  Medication Sig Dispense Refill  . adalimumab (HUMIRA PEN) 40 MG/0.8ML injection Inject 40 mg into the skin as directed.      Marland Kitchen albuterol (VENTOLIN HFA) 108 (90 BASE) MCG/ACT inhaler Inhale 1 puff into the lungs every 6 (six) hours as needed for wheezing.       . citalopram (CELEXA) 40 MG tablet Take 40 mg by mouth daily.      . clotrimazole (LOTRIMIN) 1 % cream Apply 1 application topically as directed.      . cloZAPine (CLOZARIL) 100 MG tablet Take 150 mg by mouth at bedtime. 1.5 tablets      . cloZAPine (CLOZARIL) 100 MG tablet Take 150 mg by mouth daily. Take each morning.      . diphenhydrAMINE (BENADRYL) 25 MG tablet Take 25 mg by mouth at bedtime.      . ergocalciferol (DRISDOL) 50000 UNITS capsule Take 50,000 Units by mouth every 30 (thirty) days.      . fenofibrate (  TRICOR) 145 MG tablet Take 145 mg by mouth daily.      . furosemide (LASIX) 20 MG tablet Take 40 mg by mouth daily.       Marland Kitchen ketoconazole (NIZORAL) 2 % cream Apply 1 application topically as directed.      Marland Kitchen levothyroxine (SYNTHROID, LEVOTHROID) 75 MCG tablet Take 75 mcg by mouth daily before breakfast.      . medroxyPROGESTERone (PROVERA) 10 MG tablet Take 10 mg by mouth daily.      . Mesalamine (ASACOL HD PO) Take 1 tablet by mouth 3 (three) times daily with meals.      . Mesalamine (DELZICOL) 400 MG CPDR Take 400 mg by mouth 2 (two) times daily.      . metFORMIN (GLUCOPHAGE) 1000 MG tablet Take 1,000 mg by mouth daily.       . Multiple Vitamins-Minerals (THEREMS M PO) Take 1 tablet by mouth daily.      . Omega-3 Fatty Acids (FISH OIL) 1000 MG CAPS Take 2 capsules by mouth daily.      . pantoprazole (PROTONIX) 40 MG tablet Take 40 mg by mouth daily.  Each morning.      Marland Kitchen POTASSIUM BICARBONATE PO Take 20 mEq by mouth 2 (two) times daily.       . predniSONE (DELTASONE) 20 MG tablet Take 60 mg by mouth daily.      . risperiDONE (RISPERDAL) 1 MG tablet Take 1 mg by mouth 2 (two) times daily.      . simethicone (MYLICON) 80 MG chewable tablet Chew 80 mg by mouth as needed for flatulence.      . simvastatin (ZOCOR) 40 MG tablet Take 40 mg by mouth at bedtime.      Marland Kitchen spironolactone (ALDACTONE) 50 MG tablet Take 50 mg by mouth daily.      . vitamin B-12 (CYANOCOBALAMIN) 1000 MCG tablet Take 1,000 mcg by mouth daily.       No current facility-administered medications for this visit.    Review of Systems Review of Systems  Constitutional: Negative.   Respiratory: Negative.   Cardiovascular: Negative.     Blood pressure 126/88, pulse 80, resp. rate 14, height 5\' 6"  (1.676 m), weight 262 lb (118.842 kg).  Physical Exam Physical Exam  Constitutional: She is oriented to person, place, and time. She appears well-developed and well-nourished.  Neck: Neck supple. No thyromegaly present.  Cardiovascular: Normal rate, regular rhythm and normal heart sounds.   No murmur heard. Pulmonary/Chest: Effort normal and breath sounds normal. Right breast exhibits no inverted nipple, no mass, no nipple discharge, no skin change and no tenderness. Left breast exhibits no inverted nipple, no mass, no nipple discharge, no skin change and no tenderness.  Lymphadenopathy:    She has no cervical adenopathy.    She has no axillary adenopathy.  Neurological: She is alert and oriented to person, place, and time.  Skin: Skin is warm and dry.    Data Reviewed Bilateral mammograms dated June 20, 2014 were reviewed. Stable intramammary lymph nodes. No interval change from 2013. BI-RAD-2.  Assessment    Benign breast exam and mammogram.     Plan    The patient will continue annual screening mammograms and clinical exams with her primary care provider. Follow  up here we have an as-needed basis.     PCP: [facility] Ref. Dr. Ivin Booty.   Robert Bellow 07/02/2014, 9:39 PM

## 2014-07-01 NOTE — Patient Instructions (Signed)
Patient to return as needed. The patient is aware to call back for any questions or concerns. 

## 2014-09-01 ENCOUNTER — Encounter: Payer: Self-pay | Admitting: General Surgery
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
## 2015-02-17 NOTE — Consult Note (Signed)
Details:    - Psychiatry: Patient seen. She is still only minimally responsive but I think a little better than yesterday. She was able to nod yes to several questions. She also was able to sip a little water from a straw. Not agitated or combative. Alert and awake. Good eye contact. Flat affect. Able to understand at least simple language - I asked her if she hurt anywhere and she raised her arm and looked at her IV site.   I'm going to go ahead and order the clozaril 100mg  at night. Her prior dose was reported as 100mg  tid so this is a reasonable -not too low or too high- dose to start with. I am going to cross my fingers that she will try to swollow pills tonight. She did nod yes to me when I asked if she would try to swallow her pills.   Continue iv haldol standing for now.  DC the seroquel - not really needed esp if she isn't taking po.  Will follow.   Electronic Signatures: Gonzella Lex (MD)  (Signed 01-Oct-13 17:19)  Authored: Details   Last Updated: 01-Oct-13 17:19 by Gonzella Lex (MD)

## 2015-02-17 NOTE — Consult Note (Signed)
Details:    - Psychiatry: PAtient seen. Chart reviewed. PAtient is awake and alert today and eating her supper. Tolerating medication well. Thought she was in [place], but was pleasant about it and didn't argue the point. I see she is off the haldol  iv now. Great. Will increase the clozapine to 100mg  qam and 150mg  qhs. Not sure how long she will be here but we may want to check a clozapine level before she goes. Will make sure she gets a WBC and ANC done tomorrow per clozaril rules. Will follow.   Electronic Signatures: Gonzella Lex (MD)  (Signed 07-Oct-13 17:31)  Authored: Details   Last Updated: 07-Oct-13 17:31 by Gonzella Lex (MD)
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
## 2015-02-17 NOTE — Consult Note (Signed)
Brief Consult Note: Diagnosis: delirium, hx of schizophrenia.   Patient was seen by consultant.   Consult note dictated.   Recommend further assessment or treatment.   Discussed with Attending MD.   Comments: Psychiatry: Patient seen. Chart reviewed. Spoke with Dr Benjaman Lobe. Patient presents from group home with acute change in mental state. Unclear to me what the baseline is. Patient is awake but very hard to engage. Can say her name but not answer other questions. Cant hold her attention. Looks diaphoretic. Looking through her notes I cant figure out if she is or is not on ay antipsychotic. Clozapine listed on FL2 but not on MARS. Pt has abnormal CXR, elevated WBC, looks uncomfortable. Not acting agressive or uncooperative. At this point looks like it may be more delirium than psychosis. Will defer medication change at this time. Will follow.  Electronic Signatures: Zubayr Bednarczyk, Madie Reno (MD)  (Signed 27-Sep-13 14:22)  Authored: Brief Consult Note   Last Updated: 27-Sep-13 14:22 by Gonzella Lex (MD)

## 2015-02-17 NOTE — Consult Note (Signed)
Details:    - Psychiatry: Patient seen. Chart reviewed including notes and MARS. PAtient is asleep now and hard to arouse. Will open eyes briefly but won't keep them open. Mutters name but nothing else. PCP found her to be less responsive and seemed paranoid. Patient not hostile and may be just sleeping but may be slipping back farther down in delirium.  I may have stopped the haldol prematurely. I will restart the haldol iv 3mg  at q4 schedule now while continuing the clozapine and planning to increase dose by 50mg  more tomorrow am for a total of 200mg  a day as of tomorrow. I won't be rounding this weekend but you can page me at [phone] over weekend or reach the psychiatrist on call, Dr. Franchot Mimes, if needed. Will continue to follow.   Electronic Signatures: Gonzella Lex (MD)  (Signed 04-Oct-13 11:33)  Authored: Details   Last Updated: 04-Oct-13 11:33 by Gonzella Lex (MD)

## 2015-02-17 NOTE — Consult Note (Signed)
Details:    - Psychiatry: PAtient seen. She is back to not taking po medication and being more sedated. Sleepy but arousable.Affect flat. Took her  a few minutes just to say her name. Not agitated or hostile.  Has returned to a delirium. I had hoped to get a clozapine level but cant if she isn't taking pills. Changed haldol IV order to suggest haldol be used not only for agitation but also for these spells of med refusal. Will follow.   Electronic Signatures: Vonna Brabson, Madie Reno (MD)  (Signed 10-Oct-13 12:26)  Authored: Details   Last Updated: 10-Oct-13 12:26 by Gonzella Lex (MD)

## 2015-02-17 NOTE — Op Note (Signed)
PATIENT NAMEDULCIE, [name] MR#:  [id number] DATE OF BIRTH:  May 02, 1971  DATE OF PROCEDURE:  06/04/2012  PREOPERATIVE DIAGNOSIS: Cataract, left eye.   POSTOPERATIVE DIAGNOSIS: Cataract, left eye.   PROCEDURE PERFORMED: Extracapsular cataract extraction using phacoemulsification with placement of Alcon [id number] 22.0-diopter posterior chamber lens, serial number [id number].   SURGEON: Loura Back. Mayline Dragon, MD    ANESTHESIA: 4% lidocaine and 0.75% Marcaine, 50-50 mixture with 10 units/mL of Vitrase added given as peribulbar.   ANESTHESIOLOGIST: Dr. Boston Service   COMPLICATIONS: None.   ESTIMATED BLOOD LOSS: Less than 1 mL.   DESCRIPTION OF PROCEDURE: The patient was brought to the operating room and given IV sedation and peribulbar block. She was prepped and draped in the usual fashion. Vertical rectus muscles were imbricated using 5-0 silk sutures, bridle sutures. A limbal peritomy was carried out for 1 clock hour at 12 o'clock. Hemostasis was obtained with cautery. Partial thickness scleral groove was made at the posterior surgical limbus. This was dissected anteriorly into clear cornea with an Alcon crescent knife and the anterior chamber was entered superotemporally through clear cornea with a paracentesis knife. It was also entered through the lamellar dissection with a 2.6 mm keratome. DisCoVisc was used to replace the aqueous and a continuous tear circular capsulorrhexis was carried out. Hydrodissection and hydrodelineation was carried out. Phacoemulsification was carried out for six tenths of a second. It was realized the lens was extremely soft and so the center part of the lens was essentially aspirated with very little phaco power. Irrigation-aspiration was used to remove the residual epinucleus and cortex. The capsular bag was inflated with DisCoVisc and the intraocular lens was inserted in the capsular bag using a [employer]. Irrigation/aspiration was used to remove the residual  DisCoVisc. The wound was then inflated with balanced salt and Miostat was injected in the paracentesis track. The wound was checked for leaks. None were found. The conjunctiva was closed with cautery. The bridle sutures were removed and two drops of Vigamox were placed in the eye. A shield was placed on the eye. The patient was discharged to the recovery room in good condition.   ____________________________ Loura Back Ehsan Corvin, MD sad:drc D: 06/04/2012 13:52:27 ET T: 06/04/2012 14:14:43 ET JOB#: [id number]  cc: Remo Lipps A. Leita Lindbloom, MD, <Dictator> Martie Lee MD ELECTRONICALLY SIGNED 06/11/2012 12:33

## 2015-02-17 NOTE — Consult Note (Signed)
Details:    - Psychiatry: Patient seen. She is alert and awake and more lucid and interactive than Ive seen her. Affect reactive. Asks appropriate questions. Smile. No med side effects. Seems at least for now to be doing better. I will order a clozapine blood level for tomorrow. It will take several days to come back, but it is useful information ultimately. No change to medication.   Electronic Signatures: Gonzella Lex (MD)  (Signed 11-Oct-13 17:42)  Authored: Details   Last Updated: 11-Oct-13 17:42 by Gonzella Lex (MD)
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
## 2015-02-17 NOTE — Consult Note (Signed)
Details:    - Psychiatry: PAtient seen. Also spoke with nurse. Patient more sedated today. Not as responsive. When I got here she was sound asleep and not wakable. Concern over whether this is return of mental illness, sedation from medicine or some other factor. Will not increase dose of clozapine and also not yet restart haldol. Will reevaluate will reevaluate in the morning.   Electronic Signatures: Gonzella Lex (MD)  (Signed 08-Oct-13 17:49)  Authored: Details   Last Updated: 08-Oct-13 17:49 by Gonzella Lex (MD)

## 2015-02-17 NOTE — Consult Note (Signed)
Brief Consult Note: Diagnosis: delirium/schizoaffective disorder.   Patient was seen by consultant.   Consult note dictated.   Orders entered.   Comments: Psychiatry: Patient seen. Chart reviewed. Also spoke with owner of group home. Hx very minimal but it appears she used to be on antipsychotics but wasn't at group home because they caouldn't yet get her an appointment. Currently not taking po and not conversant. Seems as much delirious as psychostic. I'm going to order haldol 2mg  q4 STANDING IV for now.  Electronic Signatures: Nyaja [place], Madie Reno (MD)  (Signed 30-Sep-13 12:49)  Authored: Brief Consult Note   Last Updated: 30-Sep-13 12:49 by Gonzella Lex (MD)

## 2015-02-17 NOTE — Consult Note (Signed)
PATIENT NAMEVICTORINA, [name] MR#:  [id number] DATE OF BIRTH:  17-Jun-1971  DATE OF CONSULTATION:  07/30/2012  REFERRING PHYSICIAN:   CONSULTING PHYSICIAN:  Gonzella Lex, MD  IDENTIFYING INFORMATION AND CHIEF COMPLAINT AND REASON FOR CONSULT: This is a 44 year old woman with a past diagnosis of schizoaffective disorder who was admitted to the medical floor because of acute medical problems and delirium. Consult for delirium and history of mental health problems and refusal of medication.   HISTORY OF PRESENT ILLNESS: Information obtained from the patient, which is very minimal, from the chart, and from a conversation with the owner of her current group home or family care home. The patient is currently not able to give any history at all. She was just barely able to say her name on two occasions but not able to make any other kind of lucid conversation. The chart indicates that the patient was brought to the Emergency Room on Friday for an acute decompensation with acute decline rapidly of mental status and physical care. She was seen in the Emergency Room by the Emergency Room doctor and thought to probably primarily have a medical problem causing her delirium and was admitted to the medical floor. In the last day the patient has refused to take any p.o. medicines and appears to be refusing to take any p.o. nutrition as well. Information from the family care home is that at her baseline the patient is described as "jolly." She is ambulatory, verbal, able to hold a conversation. She is normally completely compliant with her medication and has never been difficult, aggressive, or oppositional. The family care home manager had noted that the patient had a diagnosis of schizoaffective disorder and that Clozaril had been on a prior MAR or FL2 sheet, but that the patient's primary doctor did not prescribe that medicine and they had been unable to get the patient in to a psychiatry appointment yet. Therefore, the  patient has not been on any antipsychotic medication since she has been at her current group home.   PAST PSYCHIATRIC HISTORY: The patient appears to be new to this area. She was at [facility] prior to going to her family care home. We have secondhand information that she has had psychiatric hospitalizations in the past but the specific details are unclear. The diagnosis is schizoaffective disorder, but we do not have a description of active symptoms of that in the past. It was indicated on one of her FL2 forms that she had been on Clozaril 100 mg 3 times a day. Not known what the indication was for starting her on Clozaril. Not known whether she has a past history of violence, aggression, or self injury.   SOCIAL HISTORY: The patient has a guardian at [facility]. I tried to call that person and was not able to even get anyone to answer the phone at [facility]. Her  closest relative is an older aunt who lives in [place]. I have not called her at this point.   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 Apparently the most severe illness she has had in recent years has been her Crohn's disease.   FAMILY HISTORY: Unknown.   SUBSTANCE ABUSE HISTORY: Unknown but certainly nothing recently. Noncontributory.   REVIEW OF SYSTEMS: The patient  is not able to answer any of my questions or even nod or shake her head in answer to anything.   MENTAL STATUS EXAM: Currently the patient has her eyes open and appears to be awake. She will make eye contact for most of the time that I am in the room but then turns away after a while. She spoke spontaneously only one time. When I began to leave the room she shouted, "Hey." When I went back to  her bedside she would not say anything else to me. Her affect is flat. No speech of any significance. She was able to tell me her name but could not answer any other questions. She made two other statements when I asked her questions, but both of them were nonsense. The patient did not appear to be actively refusing things so much as unable to cooperate and completely delirious.   ASSESSMENT: This is a 44 year old woman about whom we know very little in the past except that it appears she has been diagnosed with schizoaffective disorder and has probably had psychotic symptoms in the past and has been treated with antipsychotics and appears to have most recently been on clozapine. We also know that she has not been on any antipsychotics for about the last month. Her current condition as it presents right now appears to be more delirious than psychotic, but could represent an extreme psychosis with near catatonia. The fact that she has a history of brain injury in the past obviously complicates any understanding of her illness. The patient right now is a danger to herself if she continues to refuse p.o. intake.  She needs some kind of antipsychotic treatment. Ideally we could get her back on the clozapine but we can only do that by mouth. Clozapine cannot be given IM or IV.   TREATMENT PLAN: There are a couple of options for injectable antipsychotics but I am going to go with IV Haldol 2 mg every four hours as a standing dose for now. That hopefully should have probably the lowest side effects compared to IM Geodon or Zyprexa. Hopefully, we can get some response in her mental state. Longer term she might need to have a tube placed, although right now she has only been refusing medicine for less than a day. Hopefully she will become more cooperative. We will continue to follow.   DIAGNOSIS PRINCIPLE AND PRIMARY:  AXIS I: Delirium secondary to multiple medical and psychiatric factors.   SECONDARY DIAGNOSES:   AXIS I: Schizoaffective disorder.   AXIS II: None.   AXIS III: History of brain injury and multiple medical problems including Crohn's disease, as noted above.   AXIS IV: Severe, chronic, from displacement and lack of family support.   AXIS V: Functioning at time of evaluation: 10.    ____________________________ Gonzella Lex, MD jtc:bjt D: 07/30/2012 12:59:34 ET T: 07/30/2012 13:08:09 ET JOB#: [id number]  cc: Gonzella Lex, MD, <Dictator> Gonzella Lex MD ELECTRONICALLY SIGNED 07/31/2012 10:05

## 2015-02-17 NOTE — Consult Note (Signed)
Details:    - Psychiatry: Patient seen. Chart reviewed. Patient with history of schizophrenia and also head injury. Baseline function reported to be happy, jolly, verbal, ambulatory and acooperative. Currently patient is awake and makes eye contact but speaks very little. Can say her name but nothing else now. With a lot of help she can drink from a straw but is not making an effort to eat on her own. Affect flat. Dx still delirium and also possibly schizophrenia symptoms as well. Hx of treatment with clozapine but it cant be given im or iv.  Plan to increase iv haldol to 3mg  q4, or 18 mg a day. Supportive and explanatory counceling done though I cant tell how well she is able to understand. Will follow. So far no sign of SE from haldol.   Electronic Signatures: Gonzella Lex (MD)  (Signed 02-Oct-13 17:36)  Authored: Details   Last Updated: 02-Oct-13 17:36 by Gonzella Lex (MD)

## 2015-02-22 NOTE — Consult Note (Signed)
PATIENT NAME:  [name], CULLITON MR#:  [id number] DATE OF BIRTH:  Nov 20, 1970  DATE OF CONSULTATION:  03/15/2012  REFERRING PHYSICIAN:   CONSULTING PHYSICIAN:  Manya Silvas, MD  HISTORY OF PRESENT ILLNESS: The patient is a 44 year old white female who lives at [facility]. She has a history of Crohn's disease for at least four years. She saw Dr. Gustavo Lah and his nurse practitioner, Stephens November, on 03/05/2012 and again on 03/12/2012 for flare of diarrhea. Colonoscopy was scheduled for today and I performed the procedure for Dr. Gustavo Lah.   The colonoscopy showed severe continuous Crohn's disease from about 25 cm to the hepatic flexure. I did not advance any further than the hepatic flexure because of concern about increased possibility of complications. The colon was biopsied. I decided that she needed to be admitted to the hospital to be given IV steroids and started on biologics, specifically Humira. The hospitalist was contacted and will be admitting the patient to internal medicine service. We will follow along with her.   I discussed with the hospitalist the medication and we favor would be Solu-Medrol 20 mg IV every 6 hours and IV Cipro and Flagyl and see how she does over the weekend with this and then start Humira on Monday.   The patient has not been in the hospital for four to five years now since the beginning of her illness. Currently she is managed on one Entocort a day and some Asacol. It is clear after looking at her colon that she is going to need much more advanced medication to get better and possibly to avoid a colectomy.   PAST MEDICAL HISTORY: She does have a history of multiple medical problems including diabetes, schizoaffective disorder, obesity, hypothyroidism, hypertension, and depression. Her office notes were provided for the hospitalist and Gastroenterology will follow.  FAMILY HISTORY: The patient's mother had colon cancer diagnosed at age 27 and deceased at 74.    HABITS: No alcohol, no tobacco.   PHYSICAL EXAMINATION:   GENERAL: White female in no acute distress, massively obese, weight recently recorded at 335 pounds.   HEENT: Sclera anicteric. Conjunctivae negative.   NECK: Negative.   CHEST: Clear.   HEART: Normal S1 and S2.   ABDOMEN: Very obese. Bowel sounds present. Bilateral lower abdominal tenderness, some mild upper abdominal tenderness.   SKIN: Warm and dry.   PSYCH: Mood and affect are appropriate and alert.   ASSESSMENT: Severe Crohn's disease in need of aggressive therapy.   PLAN: Hospitalist to admit to the hospital. Gastroenterology will follow. ____________________________ Manya Silvas, MD rte:slb D: 03/15/2012 17:30:28 ET T: 03/15/2012 17:40:49 ET JOB#: [id number]  cc: Manya Silvas, MD, <Dictator> Lollie Sails, MD Manya Silvas MD ELECTRONICALLY SIGNED 04/06/2012 17:48

## 2015-02-22 NOTE — Consult Note (Signed)
Chief Complaint:   Subjective/Chief Complaint Feels better. Only two liquid bowel movements today. No nausea or vomiting.   VITAL SIGNS/ANCILLARY NOTES: **Vital Signs.:   18-May-13 05:33   Vital Signs Type Routine   Temperature Temperature (F) 97.1   Celsius 36.1   Temperature Source axillary   Pulse Pulse 78   Pulse source per Dinamap   Respirations Respirations 17   Systolic BP Systolic BP 031   Diastolic BP (mmHg) Diastolic BP (mmHg) 80   Mean BP 94   BP Source Dinamap   Pulse Ox % Pulse Ox % 91   Pulse Ox Activity Level  At rest   Oxygen Delivery 2L    07:17   Vital Signs Type POCT   Nurse Fingerstick (mg/dL) FSBS (fasting range 65-99 mg/dL) 163   Comments/Interventions  Pre-Meal    11:30   Vital Signs Type POCT   Nurse Fingerstick (mg/dL) FSBS (fasting range 65-99 mg/dL) 187   Comments/Interventions  Pre-Meal    16:26   Vital Signs Type POCT   Nurse Fingerstick (mg/dL) FSBS (fasting range 65-99 mg/dL) 152   Comments/Interventions  Pre-Meal   Brief Assessment:   Additional Physical Exam Abdomen is soft. Somewhat distended. Bowel sounds are hyperactive.   Routine Hem:  18-May-13 05:58    WBC (CBC) 14.1   RBC (CBC) 3.67   Hemoglobin (CBC) 10.2   Hematocrit (CBC) 32.6   Platelet Count (CBC) 543   MCV 89   MCH 27.9   MCHC 31.5   RDW 16.9   Neutrophil % 77.7   Lymphocyte % 18.5   Monocyte % 3.7   Eosinophil % 0.0   Basophil % 0.1   Neutrophil # 10.9   Lymphocyte # 2.6   Monocyte # 0.5   Eosinophil # 0.0   Basophil # 0.0  Routine Chem:  18-May-13 05:58    Glucose, Serum 154   BUN 4   Creatinine (comp) 0.86   Sodium, Serum 148   Potassium, Serum 3.5   Chloride, Serum 112   CO2, Serum 26   Calcium (Total), Serum 7.2   Osmolality (calc) 294   eGFR (African American) >60   eGFR (Non-African American) >60   Anion Gap 10  Blood Glucose:  18-May-13 07:20    POCT Blood Glucose 163    11:29    POCT Blood Glucose 187    16:29    POCT Blood Glucose  152   Assessment/Plan:  Assessment/Plan:   Assessment Crohn's colitis. Seems to be improving on steroids and antibiotics.    Plan Continue current treatment. Dr. Donnella Sham will reume care on Monday.   Electronic Signatures: Jill Side (MD)  (Signed [phone] 18:59)  Authored: Chief Complaint, VITAL SIGNS/ANCILLARY NOTES, Brief Assessment, Lab Results, Assessment/Plan   Last Updated: 18-May-13 18:59 by Jill Side (MD)

## 2015-02-22 NOTE — Consult Note (Signed)
Chief Complaint:   Subjective/Chief Complaint feeling some better.  less nausea and less abdominal pain, tolerating clear liquids.   VITAL SIGNS/ANCILLARY NOTES: **Vital Signs.:   17-May-13 15:01   Vital Signs Type Routine   Temperature Temperature (F) 97.6   Celsius 36.4   Temperature Source axillary   Pulse Pulse 79   Pulse source per Dinamap   Respirations Respirations 18   Systolic BP Systolic BP 078   Diastolic BP (mmHg) Diastolic BP (mmHg) 70   Mean BP 89   BP Source Dinamap   Pulse Ox % Pulse Ox % 92   Pulse Ox Activity Level  At rest   Oxygen Delivery 1L   Brief Assessment:   Cardiac Regular    Respiratory clear BS    Gastrointestinal details normal Soft  Nondistended  No masses palpable  Bowel sounds normal  obese, unable to palpate internal organs, mild rlq tenderness.   Routine Hem:  17-May-13 04:43    WBC (CBC) 13.0   RBC (CBC) 4.04   Hemoglobin (CBC) 11.0   Hematocrit (CBC) 36.3   Platelet Count (CBC) 543   MCV 90   MCH 27.2   MCHC 30.4   RDW 16.8   Neutrophil % 79.0   Lymphocyte % 19.2   Monocyte % 1.3   Eosinophil % 0.0   Basophil % 0.5   Neutrophil # 10.3   Lymphocyte # 2.5   Monocyte # 0.2   Eosinophil # 0.0   Basophil # 0.1  Routine Chem:  17-May-13 04:43    Glucose, Serum 136   BUN 3   Creatinine (comp) 1.15   Sodium, Serum 141   Potassium, Serum 3.7   Chloride, Serum 105   CO2, Serum 28   Calcium (Total), Serum 7.5   Osmolality (calc) 280   eGFR (African American) >60   eGFR (Non-African American) 59   Anion Gap 8   Magnesium, Serum 1.8  Blood Glucose:  17-May-13 08:00    POCT Blood Glucose 144  Routine UA:  17-May-13 08:52    Color (UA) Straw   Clarity (UA) Clear   Glucose (UA) Negative   Bilirubin (UA) Negative   Ketones (UA) Negative   Specific Gravity (UA) 1.002   Blood (UA) Negative   pH (UA) 7.0   Protein (UA) Negative   Nitrite (UA) Negative   Leukocyte Esterase (UA) Negative   WBC (UA) 1 /HPF   Bacteria  (UA) 1+   Epithelial Cells (UA) 1 /HPF  Blood Glucose:  17-May-13 11:19    POCT Blood Glucose 161   Assessment/Plan:  Assessment/Plan:   Assessment 1) crohns disease, flare.  Not on previous treatment.  feeling some better on steroid and abx currently.    Plan 1) finish 10 day course of abx.  Awaiting biopsies from colonoscopy yesterday.  Testing has been ordered in anticipation of starting biologic next week.  Continue iv steroid for now, anticipating changing to po on monday.  Will need close GI fu as o/p.  Dr Dionne Milo covering this weekend.   Electronic Signatures: Loistine Simas (MD)  (Signed [phone] 16:03)  Authored: Chief Complaint, VITAL SIGNS/ANCILLARY NOTES, Brief Assessment, Lab Results, Assessment/Plan   Last Updated: 17-May-13 16:03 by Loistine Simas (MD)

## 2015-02-22 NOTE — Op Note (Signed)
PATIENT NAMETAJHA, [name] DATE OF BIRTH:  November 29, 1970  DATE OF PROCEDURE:  01/23/2012  PREOPERATIVE DIAGNOSIS: Cataract, right eye.   POSTOPERATIVE DIAGNOSIS: Cataract, left eye.   PROCEDURE PERFORMED: Extracapsular cataract extraction using irrigation/aspiration with placement of SN6CWS 22.0-diopter posterior chamber lens, serial number # [id number].   SURGEON: Loura Back. Inika Bellanger, M.D.   ANESTHESIA: 4% lidocaine and 0.75% Marcaine in a 50-50 mixture with 10 units/mL of Hylenex added given as peribulbar.   ANESTHESIOLOGIST: Dr. Marcello Moores.   COMPLICATIONS: None.   ESTIMATED BLOOD LOSS: Less than 1 mL.   DESCRIPTION OF PROCEDURE: The patient was brought to the operating room and given a peribulbar block. She was given IV sedation, prepped and draped in the usual fashion. The vertical rectus muscles were imbricated using 5-0 silk sutures, bridle sutures. A limbal peritomy was carried out at 12 o'clock for one clock hour. Hemostasis was obtained with cautery and a partial thickness scleral groove was made at the posterior surgical limbus and dissected anteriorly into clear cornea with an Alcon crescent knife. The anterior chamber was entered superonasally through clear cornea with a paracentesis knife and through the lamellar dissection with a 2.6 mm keratome. DisCoVisc was used to replace the aqueous and a continuous tear circular capsulorrhexis was carried out. Hydrodelineation was used to loosen the nucleus. The nucleus popped one edge in the anterior chamber and an attempt was made to hydrodelineate it further. It was noted the nucleus was fairly soft and so decision was made to use the eye handpiece to remove the lens. This was done successfully so there was no ultrasound time to record. Irrigation-aspiration then removed the residual cortex in the periphery and capsular bag was inflated with DisCoVisc. The intraocular lens was inserted in the capsular bag using an AcrySert  delivery system. Irrigation-aspiration was used to remove the residual DisCoVisc. The wound was inflated with balanced salt and Miostat was injected through the paracentesis track. The wound was checked for leaks. None were found. Conjunctiva was then closed with cautery. Bridle sutures were removed and three drops of Vigamox were placed on the eye. A shield was placed over the eye. The patient was discharged to the recovery room in good condition.   ____________________________ Loura Back Cystal Shannahan, MD sad:drc D: 01/23/2012 13:06:33 ET T: 01/23/2012 13:13:59 ET JOB#: [id number]  cc: Remo Lipps A. Angela Vazguez, MD, <Dictator> Martie Lee MD ELECTRONICALLY SIGNED 01/30/2012 13:15

## 2015-02-22 NOTE — Consult Note (Signed)
Chief Complaint:   Subjective/Chief Complaint feeling much better, no n/v or abd pain.  tolerating po.   VITAL SIGNS/ANCILLARY NOTES: **Vital Signs.:   20-May-13 06:12   Vital Signs Type Routine   Temperature Temperature (F) 98.8   Celsius 37.1   Temperature Source oral   Pulse Pulse 72   Pulse source per Dinamap   Respirations Respirations 17   Systolic BP Systolic BP 709   Diastolic BP (mmHg) Diastolic BP (mmHg) 72   Mean BP 87   BP Source Dinamap   Pulse Ox % Pulse Ox % 94   Pulse Ox Activity Level  At rest   Oxygen Delivery 2L    13:16   Vital Signs Type Routine   Temperature Temperature (F) 97.5   Celsius 36.3   Temperature Source oral   Pulse Pulse 94   Pulse source per Dinamap   Respirations Respirations 17   Systolic BP Systolic BP 628   Diastolic BP (mmHg) Diastolic BP (mmHg) 77   Mean BP 91   BP Source Dinamap   Pulse Ox % Pulse Ox % 95   Pulse Ox Activity Level  At rest   Oxygen Delivery 2L  *Intake and Output.:   20-May-13 11:19   Stool  had normal bowel movement per patient   Brief Assessment:   Cardiac Regular    Respiratory clear BS    Gastrointestinal details normal Soft  Nontender  Nondistended  No masses palpable  Bowel sounds normal  obese, unable to palpate internal organs.   Routine Chem:  20-May-13 04:40    Glucose, Serum 129   BUN 13   Creatinine (comp) 0.99   Sodium, Serum 146   Potassium, Serum 4.4   Chloride, Serum 112   CO2, Serum 26   Calcium (Total), Serum 7.5   Osmolality (calc) 292   eGFR (African American) >60   eGFR (Non-African American) >60   Anion Gap 8  Blood Glucose:  20-May-13 07:45    POCT Blood Glucose 130    11:44    POCT Blood Glucose 179   Assessment/Plan:  Assessment/Plan:   Assessment 1) crohns disease-on po steroid now.  improved overall.  was not on tx as o/p PTA.  hep b serology negative, awaiting TB test results in anticipation of starting remicade as o/p.  Continue prednisone 60 mg po daily, will  need to be seen in the o/p GI clinic within a week to arrange for infusion.    Plan 1) as above.   Electronic Signatures: Loistine Simas (MD)  (Signed 20-May-13 13:44)  Authored: Chief Complaint, VITAL SIGNS/ANCILLARY NOTES, Brief Assessment, Lab Results, Assessment/Plan   Last Updated: 20-May-13 13:44 by Loistine Simas (MD)
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
## 2015-02-22 NOTE — Consult Note (Signed)
Chief Complaint:   Subjective/Chief Complaint patient doing well, denies abdominal pain or nausea, tolerating po.  +bm, loose.   VITAL SIGNS/ANCILLARY NOTES: **Vital Signs.:   21-May-13 13:57   Vital Signs Type Routine   Temperature Temperature (F) 97.5   Celsius 36.3   Temperature Source oral   Pulse Pulse 90   Pulse source per Dinamap   Respirations Respirations 18   Systolic BP Systolic BP 759   Diastolic BP (mmHg) Diastolic BP (mmHg) 81   Mean BP 92   BP Source Dinamap   Pulse Ox % Pulse Ox % 95   Pulse Ox Activity Level  At rest   Oxygen Delivery Room Air/ 21 %   Brief Assessment:   Cardiac Regular    Respiratory clear BS    Gastrointestinal details normal Soft  Nontender  Nondistended  Bowel sounds normal  obese, unable to palpate internal organs   Assessment/Plan:  Assessment/Plan:   Assessment 1) crohns disease exacerbation-doing well on po prednisone. Colon biopsies c/w crohns disease.  Plans to arrange for remicade infusions as outpatient.  Humira, though an effective medicine, is likely not as good of an option as it is done by the patient at home, whereas the remicade is an infusion done at the office as o/p.    Plan continue current, will see patient within one week of d/c in o/p clinic   Electronic Signatures: Loistine Simas (MD)  (Signed 21-May-13 20:58)  Authored: Chief Complaint, VITAL SIGNS/ANCILLARY NOTES, Brief Assessment, Assessment/Plan   Last Updated: 21-May-13 20:58 by Loistine Simas (MD)
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
## 2016-02-16 ENCOUNTER — Ambulatory Visit: Payer: Medicare Other | Admitting: *Deleted

## 2016-02-16 ENCOUNTER — Encounter: Payer: Self-pay | Admitting: Anesthesiology

## 2016-02-16 ENCOUNTER — Ambulatory Visit
Admission: RE | Admit: 2016-02-16 | Discharge: 2016-02-16 | Disposition: A | Discharge status: Home / Self Care | Payer: Medicare Other | Source: Ambulatory Visit | Attending: Gastroenterology | Admitting: Gastroenterology

## 2016-02-16 ENCOUNTER — Encounter
Admission: RE | Disposition: A | Discharge status: Home / Self Care | Payer: Self-pay | Source: Ambulatory Visit | Attending: Gastroenterology

## 2016-02-16 DIAGNOSIS — Z79899 Other long term (current) drug therapy: Secondary | ICD-10-CM | POA: Insufficient documentation

## 2016-02-16 DIAGNOSIS — Z91010 Allergy to peanuts: Secondary | ICD-10-CM | POA: Diagnosis not present

## 2016-02-16 DIAGNOSIS — Z888 Allergy status to other drugs, medicaments and biological substances status: Secondary | ICD-10-CM | POA: Diagnosis not present

## 2016-02-16 DIAGNOSIS — K449 Diaphragmatic hernia without obstruction or gangrene: Secondary | ICD-10-CM | POA: Diagnosis not present

## 2016-02-16 DIAGNOSIS — Z7952 Long term (current) use of systemic steroids: Secondary | ICD-10-CM | POA: Insufficient documentation

## 2016-02-16 DIAGNOSIS — K5090 Crohn's disease, unspecified, without complications: Secondary | ICD-10-CM | POA: Diagnosis present

## 2016-02-16 DIAGNOSIS — K219 Gastro-esophageal reflux disease without esophagitis: Secondary | ICD-10-CM | POA: Insufficient documentation

## 2016-02-16 DIAGNOSIS — Z7984 Long term (current) use of oral hypoglycemic drugs: Secondary | ICD-10-CM | POA: Diagnosis not present

## 2016-02-16 DIAGNOSIS — I10 Essential (primary) hypertension: Secondary | ICD-10-CM | POA: Diagnosis not present

## 2016-02-16 DIAGNOSIS — E669 Obesity, unspecified: Secondary | ICD-10-CM | POA: Diagnosis not present

## 2016-02-16 DIAGNOSIS — E785 Hyperlipidemia, unspecified: Secondary | ICD-10-CM | POA: Insufficient documentation

## 2016-02-16 DIAGNOSIS — E559 Vitamin D deficiency, unspecified: Secondary | ICD-10-CM | POA: Diagnosis not present

## 2016-02-16 DIAGNOSIS — E039 Hypothyroidism, unspecified: Secondary | ICD-10-CM | POA: Insufficient documentation

## 2016-02-16 DIAGNOSIS — F329 Major depressive disorder, single episode, unspecified: Secondary | ICD-10-CM | POA: Diagnosis not present

## 2016-02-16 DIAGNOSIS — Z6841 Body Mass Index (BMI) 40.0 and over, adult: Secondary | ICD-10-CM | POA: Diagnosis not present

## 2016-02-16 DIAGNOSIS — E119 Type 2 diabetes mellitus without complications: Secondary | ICD-10-CM | POA: Diagnosis not present

## 2016-02-16 DIAGNOSIS — Z800 Family history of malignant neoplasm of digestive organs: Secondary | ICD-10-CM | POA: Insufficient documentation

## 2016-02-16 DIAGNOSIS — Z8719 Personal history of other diseases of the digestive system: Secondary | ICD-10-CM | POA: Diagnosis not present

## 2016-02-16 HISTORY — PX: COLONOSCOPY WITH PROPOFOL: SHX5780

## 2016-02-16 LAB — GLUCOSE, CAPILLARY: Glucose-Capillary: 94 mg/dL (ref 65–99)

## 2016-02-16 SURGERY — COLONOSCOPY WITH PROPOFOL
Anesthesia: General

## 2016-02-16 MED ORDER — SODIUM CHLORIDE 0.9 % IV SOLN
INTRAVENOUS | Status: DC
  Administered 2016-02-16: 1000 mL via INTRAVENOUS

## 2016-02-16 MED ORDER — PROPOFOL 500 MG/50ML IV EMUL
INTRAVENOUS | Status: DC | PRN
  Administered 2016-02-16: 100 ug/kg/min via INTRAVENOUS

## 2016-02-16 MED ORDER — SODIUM CHLORIDE 0.9 % IV SOLN: INTRAVENOUS | Status: DC

## 2016-02-16 NOTE — Anesthesia Postprocedure Evaluation (Signed)
Anesthesia Post Note  Patient: [name]  Procedure(s) Performed: Procedure(s) (LRB): COLONOSCOPY WITH PROPOFOL (N/A)  Patient location during evaluation: Endoscopy Anesthesia Type: General Level of consciousness: awake and alert Pain management: pain level controlled Vital Signs Assessment: post-procedure vital signs reviewed and stable Respiratory status: spontaneous breathing, nonlabored ventilation, respiratory function stable and patient connected to nasal cannula oxygen Cardiovascular status: blood pressure returned to baseline and stable Postop Assessment: no signs of nausea or vomiting Anesthetic complications: no    Last Vitals:  Filed Vitals:   02/16/16 0840 02/16/16 0850  BP: 101/76 111/72  Pulse:    Temp:    Resp:      Last Pain: There were no vitals filed for this visit.               Jader Desai S
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
## 2016-02-16 NOTE — Op Note (Signed)
[facility] Patient Name: [name] Procedure Date: 02/16/2016 7:44 AM MRN: [id number] Account #: [id number] Date of Birth: [birth year] Admit Type: Outpatient Age: 45 Room: [facility] ENDO ROOM 3 Gender: Female Note Status: Finalized Procedure:            Colonoscopy Indications:          Personal history of Crohn's disease Providers:            Lollie Sails, MD Referring MD:         Meindert A. Brunetta Genera, MD (Referring MD) Medicines:            Monitored Anesthesia Care Complications:        No immediate complications. Procedure:            Pre-Anesthesia Assessment:                       - ASA Grade Assessment: III - A patient with severe                        systemic disease.                       After obtaining informed consent, the colonoscope was                        passed under direct vision. Throughout the procedure,                        the patient's blood pressure, pulse, and oxygen                        saturations were monitored continuously. The                        Colonoscope was introduced through the anus with the                        intention of advancing to the surgical stoma. The scope                        was advanced to the transverse colon before the                        procedure was aborted. Medications were given. The                        colonoscopy was extremely difficult due to poor bowel                        prep with stool present. The patient tolerated the                        procedure. The quality of the bowel preparation was                        poor. Findings:      The scope was advanced to the transverse colon with difficulty. The prep       was poor with solid items that could not be moved or rinsed or suctioned       and there were  multiple obstructions of the suction channel. There is       the appearance of numerous pseudopolyps, however prep was not adequate       to ascertain  detail on more than 50% of the luminal surface. Impression:           - Preparation of the colon was poor.                       - No specimens collected. Recommendation:       - Put patient on a clear liquid diet, repat prep and                        reschedule. Procedure Code(s):    --- Professional ---                       [phone], 39, Colonoscopy, flexible; diagnostic, including                        collection of specimen(s) by brushing or washing, when                        performed (separate procedure) Diagnosis Code(s):    --- Professional ---                       Z87.19, Personal history of other diseases of the                        digestive system CPT copyright 2016 [employer]. All rights reserved. The codes documented in this report are preliminary and upon coder review may  be revised to meet current compliance requirements. Lollie Sails, MD 02/16/2016 8:14:28 AM This report has been signed electronically. Number of Addenda: 0 Note Initiated On: 02/16/2016 7:44 AM Total Procedure Duration: 0 hours 15 minutes 22 seconds       [facility]

## 2016-02-16 NOTE — OR Nursing (Signed)
Poor prep procedure not completed.

## 2016-02-16 NOTE — Transfer of Care (Signed)
Immediate Anesthesia Transfer of Care Note  Patient: [name]  Procedure(s) Performed: Procedure(s): COLONOSCOPY WITH PROPOFOL (N/A)  Patient Location: PACU  Anesthesia Type:General  Level of Consciousness: awake, alert  and oriented  Airway & Oxygen Therapy: Patient Spontanous Breathing and Patient connected to nasal cannula oxygen  Post-op Assessment: Report given to RN and Post -op Vital signs reviewed and stable  Post vital signs: Reviewed and stable  Last Vitals:  Filed Vitals:   02/16/16 0652 02/16/16 0816  BP: 117/78 97/64  Pulse: 72 65  Temp: 35.7 C 35.6 C  Resp: 16 13    Complications: No apparent anesthesia complications

## 2016-02-17 ENCOUNTER — Encounter: Payer: Self-pay | Admitting: Gastroenterology

## 2016-02-19 ENCOUNTER — Encounter: Payer: Self-pay | Admitting: *Deleted

## 2016-02-22 ENCOUNTER — Ambulatory Visit: Payer: Medicare Other | Admitting: Anesthesiology

## 2016-02-22 ENCOUNTER — Ambulatory Visit
Admission: RE | Admit: 2016-02-22 | Discharge: 2016-02-22 | Disposition: A | Discharge status: Home / Self Care | Payer: Medicare Other | Source: Ambulatory Visit | Attending: Gastroenterology | Admitting: Gastroenterology

## 2016-02-22 ENCOUNTER — Encounter
Admission: RE | Disposition: A | Discharge status: Home / Self Care | Payer: Self-pay | Source: Ambulatory Visit | Attending: Gastroenterology

## 2016-02-22 ENCOUNTER — Encounter: Payer: Self-pay | Admitting: *Deleted

## 2016-02-22 DIAGNOSIS — Z87820 Personal history of traumatic brain injury: Secondary | ICD-10-CM | POA: Diagnosis not present

## 2016-02-22 DIAGNOSIS — K5090 Crohn's disease, unspecified, without complications: Secondary | ICD-10-CM | POA: Diagnosis present

## 2016-02-22 DIAGNOSIS — Z7984 Long term (current) use of oral hypoglycemic drugs: Secondary | ICD-10-CM | POA: Insufficient documentation

## 2016-02-22 DIAGNOSIS — E119 Type 2 diabetes mellitus without complications: Secondary | ICD-10-CM | POA: Diagnosis not present

## 2016-02-22 DIAGNOSIS — K219 Gastro-esophageal reflux disease without esophagitis: Secondary | ICD-10-CM | POA: Diagnosis not present

## 2016-02-22 DIAGNOSIS — Z79899 Other long term (current) drug therapy: Secondary | ICD-10-CM | POA: Diagnosis not present

## 2016-02-22 DIAGNOSIS — F329 Major depressive disorder, single episode, unspecified: Secondary | ICD-10-CM | POA: Diagnosis not present

## 2016-02-22 DIAGNOSIS — E669 Obesity, unspecified: Secondary | ICD-10-CM | POA: Diagnosis not present

## 2016-02-22 DIAGNOSIS — K2950 Unspecified chronic gastritis without bleeding: Secondary | ICD-10-CM | POA: Insufficient documentation

## 2016-02-22 DIAGNOSIS — Z91010 Allergy to peanuts: Secondary | ICD-10-CM | POA: Diagnosis not present

## 2016-02-22 DIAGNOSIS — K529 Noninfective gastroenteritis and colitis, unspecified: Secondary | ICD-10-CM | POA: Diagnosis not present

## 2016-02-22 DIAGNOSIS — E039 Hypothyroidism, unspecified: Secondary | ICD-10-CM | POA: Diagnosis not present

## 2016-02-22 DIAGNOSIS — Z888 Allergy status to other drugs, medicaments and biological substances status: Secondary | ICD-10-CM | POA: Diagnosis not present

## 2016-02-22 DIAGNOSIS — Z800 Family history of malignant neoplasm of digestive organs: Secondary | ICD-10-CM | POA: Diagnosis present

## 2016-02-22 DIAGNOSIS — D120 Benign neoplasm of cecum: Secondary | ICD-10-CM | POA: Diagnosis not present

## 2016-02-22 DIAGNOSIS — F209 Schizophrenia, unspecified: Secondary | ICD-10-CM | POA: Diagnosis not present

## 2016-02-22 DIAGNOSIS — E785 Hyperlipidemia, unspecified: Secondary | ICD-10-CM | POA: Insufficient documentation

## 2016-02-22 DIAGNOSIS — Z7951 Long term (current) use of inhaled steroids: Secondary | ICD-10-CM | POA: Diagnosis not present

## 2016-02-22 DIAGNOSIS — Z6841 Body Mass Index (BMI) 40.0 and over, adult: Secondary | ICD-10-CM | POA: Diagnosis not present

## 2016-02-22 DIAGNOSIS — I10 Essential (primary) hypertension: Secondary | ICD-10-CM | POA: Diagnosis not present

## 2016-02-22 DIAGNOSIS — K449 Diaphragmatic hernia without obstruction or gangrene: Secondary | ICD-10-CM | POA: Insufficient documentation

## 2016-02-22 HISTORY — DX: Unspecified intracranial injury with loss of consciousness status unknown, initial encounter: S06.9XAA

## 2016-02-22 HISTORY — DX: Asymptomatic premature menopause: E28.319

## 2016-02-22 HISTORY — DX: Schizophrenia, unspecified: F20.9

## 2016-02-22 HISTORY — PX: COLONOSCOPY WITH PROPOFOL: SHX5780

## 2016-02-22 HISTORY — DX: Unspecified intracranial injury with loss of consciousness of unspecified duration, initial encounter: S06.9X9A

## 2016-02-22 HISTORY — DX: Hypothyroidism, unspecified: E03.9

## 2016-02-22 LAB — GLUCOSE, CAPILLARY: GLUCOSE-CAPILLARY: 88 mg/dL (ref 65–99)

## 2016-02-22 SURGERY — COLONOSCOPY WITH PROPOFOL
Anesthesia: General

## 2016-02-22 MED ORDER — SODIUM CHLORIDE 0.9 % IV SOLN
INTRAVENOUS | Status: DC
  Administered 2016-02-22 (×2): via INTRAVENOUS

## 2016-02-22 MED ORDER — SODIUM CHLORIDE 0.9 % IV SOLN: INTRAVENOUS | Status: DC

## 2016-02-22 MED ORDER — PROPOFOL 10 MG/ML IV BOLUS
INTRAVENOUS | Status: DC | PRN
  Administered 2016-02-22: 50 mg via INTRAVENOUS
  Administered 2016-02-22: 40 mg via INTRAVENOUS

## 2016-02-22 MED ORDER — PROPOFOL 500 MG/50ML IV EMUL
INTRAVENOUS | Status: DC | PRN
  Administered 2016-02-22: 140 ug/kg/min via INTRAVENOUS

## 2016-02-22 NOTE — Anesthesia Preprocedure Evaluation (Signed)
Anesthesia Evaluation  Patient identified by MRN, date of birth, ID band Patient awake    Reviewed: Allergy & Precautions, NPO status , Patient's Chart, lab work & pertinent test results, reviewed documented beta blocker date and time   Airway Mallampati: III  TM Distance: >3 FB     Dental  (+) Chipped   Pulmonary           Cardiovascular hypertension, Pt. on medications      Neuro/Psych PSYCHIATRIC DISORDERS Depression Schizophrenia  Neuromuscular disease    GI/Hepatic hiatal hernia, GERD  ,  Endo/Other  diabetes, Type 2Hypothyroidism   Renal/GU      Musculoskeletal   Abdominal   Peds  Hematology   Anesthesia Other Findings Crohns. TBI. Obese.  Reproductive/Obstetrics                             Anesthesia Physical Anesthesia Plan  ASA: III  Anesthesia Plan: General   Post-op Pain Management:    Induction:   Airway Management Planned: Nasal Cannula  Additional Equipment:   Intra-op Plan:   Post-operative Plan:   Informed Consent: I have reviewed the patients History and Physical, chart, labs and discussed the procedure including the risks, benefits and alternatives for the proposed anesthesia with the patient or authorized representative who has indicated his/her understanding and acceptance.     Plan Discussed with: CRNA  Anesthesia Plan Comments:         Anesthesia Quick Evaluation

## 2016-02-22 NOTE — Consult Note (Signed)
Outpatient short stay form Pre-procedure 02/22/2016 2:33 PM Kiara Sails MD  Primary Physician: [facility]  Reason for visit:  Colonoscopy  History of present illness:  Patient is a 45 year old female with a personal history of colonoscopy previously followed at [facility]. She has also been seen at times at unknown clinic. She was seen by Mrs. London on 12/29/2015 was recently and arrange to have this procedure. He is apparently not been seen by Dr. Coralyn Mark at [facility] since 2014. She is currently taking a apriso 1.5 g daily. She also takes pantoprazole for GERD symptoms.    Current facility-administered medications:  .  0.9 %  sodium chloride infusion, , Intravenous, Continuous, Kiara Sails, MD, Last Rate: 20 mL/hr at 02/22/16 1428 .  0.9 %  sodium chloride infusion, , Intravenous, Continuous, Kiara Sails, MD  Prescriptions prior to admission  Medication Sig Dispense Refill Last Dose  . citalopram (CELEXA) 40 MG tablet Take 40 mg by mouth daily.   02/22/2016 at 0800  . cloZAPine (CLOZARIL) 100 MG tablet Take 150 mg by mouth at bedtime. 1.5 tablets   02/22/2016 at 0800  . diphenhydrAMINE (BENADRYL) 25 MG tablet Take 25 mg by mouth at bedtime.   02/21/2016 at Unknown time  . fluticasone (FLONASE) 50 MCG/ACT nasal spray Place 2 sprays into both nostrils daily.   02/22/2016 at 0800  . furosemide (LASIX) 20 MG tablet Take 40 mg by mouth daily.    02/22/2016 at 0800  . levothyroxine (SYNTHROID, LEVOTHROID) 75 MCG tablet Take 75 mcg by mouth daily before breakfast.   02/22/2016 at 0800  . metFORMIN (GLUCOPHAGE) 1000 MG tablet Take 1,000 mg by mouth daily.    02/21/2016 at Unknown time  . Multiple Vitamins-Minerals (THEREMS M PO) Take 1 tablet by mouth daily.   02/22/2016 at 0800  . omega-3 acid ethyl esters (LOVAZA) 1 g capsule Take by mouth daily.   02/22/2016 at 0800  . Omega-3 Fatty Acids (FISH OIL) 1000 MG CAPS Take 2 capsules by mouth daily.   02/22/2016 at 0800  . pantoprazole  (PROTONIX) 40 MG tablet Take 40 mg by mouth daily. Each morning.   02/22/2016 at 0800  . POTASSIUM BICARBONATE PO Take 20 mEq by mouth 2 (two) times daily.    02/22/2016 at 0800  . risperiDONE (RISPERDAL) 1 MG tablet Take 1 mg by mouth 2 (two) times daily.   02/22/2016 at 0800  . simvastatin (ZOCOR) 40 MG tablet Take 40 mg by mouth at bedtime.   02/22/2016 at 0800  . spironolactone (ALDACTONE) 50 MG tablet Take 50 mg by mouth daily.   02/22/2016 at 0800  . vitamin B-12 (CYANOCOBALAMIN) 1000 MCG tablet Take 1,000 mcg by mouth daily.   02/22/2016 at 0800  . cloZAPine (CLOZARIL) 100 MG tablet Take 150 mg by mouth daily. Reported on 02/22/2016   Not Taking at Unknown time  . ergocalciferol (DRISDOL) 50000 UNITS capsule Take 50,000 Units by mouth every 30 (thirty) days. Reported on 02/22/2016   Not Taking at Unknown time  . fenofibrate (TRICOR) 145 MG tablet Take 145 mg by mouth daily. Reported on 02/22/2016   Not Taking at Unknown time  . ketoconazole (NIZORAL) 2 % cream Apply 1 application topically as directed. Reported on 02/22/2016   Not Taking at Unknown time  . Mesalamine (ASACOL HD PO) Take 1 tablet by mouth 3 (three) times daily with meals. Reported on 02/22/2016   Not Taking at Unknown time  . Mesalamine (DELZICOL) 400 MG CPDR  Take 400 mg by mouth 2 (two) times daily. Reported on 02/22/2016   Not Taking at Unknown time  . predniSONE (DELTASONE) 20 MG tablet Take 60 mg by mouth daily. Reported on 02/22/2016   Not Taking at Unknown time  . simethicone (MYLICON) 80 MG chewable tablet Chew 80 mg by mouth as needed for flatulence. Reported on 02/22/2016   Not Taking at Unknown time     Allergies  Allergen Reactions  . Peanut Oil Other (See Comments)    Face turns red  . Risperidone And Related Cough     Past Medical History  Diagnosis Date  . Crohn's disease ([place]) 2013  . Depression   . Diabetes mellitus without complication (HCC)     non insulin dependent  . Hiatal hernia 2013  . Thyroid disease      hypothyroid  . Broken leg     age 30  . Edema   . Abnormal mammogram, unspecified 2013    Prev. cytology,hypercellular smears without evidence of malignant cells. The cytopathologist questioned if samples truly representative. Care taken during sampling and is felt to be representative.  . Breast screening, unspecified 2013  . Other sign and symptom in breast 2013    Right bst US,lower outer quadrant,A single 0.3x0.4x0.6cm hypoechoic mass with slightly lobulated borders with adjacent 0.3x0.4x0.5cm mass was noted. The 1st was 5cm from nipple, 2nd at 8 cm from the nipple. The previous lesion aspirate was at 3 0'clock position. These lesions are thought to account for the mammographic abnormality. Minimal interval change on [place].   Mar[place] Kitchen Obesity, unspecified 2013  . Rib fracture     age 46  . Hyperlipidemia   . GERD (gastroesophageal reflux disease)   . 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 Hypertension   . Vitamin D deficiency   . Regional enteritis [facility])   . Cellulitis   . Early menopause   . Hypothyroidism   . Schizophrenia ([place])   . TBI (traumatic brain injury) ([place])     Review of systems:      Physical Exam    Heart and lungs: Regular rate and rhythm without rub or gallop, lungs are bilaterally clear.    HEENT: Normocephalic atraumatic eyes are anicteric    Other:     Pertinant exam for procedure: Soft nontender nondistended bowel sounds positive normoactive.    Planned proceedures: colonoscopy and indicated proceedures.  I have discussed the risks benefits and complications of procedures to include not limited to bleeding, infection, perforation and the risk of sedation and the patient wishes to proceed.    Kiara Sails, MD Gastroenterology 02/22/2016  2:33 PM

## 2016-02-22 NOTE — Op Note (Signed)
[facility] Gastroenterology Patient Name: [name] Procedure Date: 02/22/2016 2:33 PM MRN: [id number] Account #: [id number] Date of Birth: Apr 28, 1971 Admit Type: Outpatient Age: 44 Room: [facility] ENDO ROOM 3 Gender: Female Note Status: Finalized Procedure:            Colonoscopy Indications:          Personal history of Crohn's disease Providers:            Lollie Sails, MD Referring MD:         Meindert A. Brunetta Genera, MD (Referring MD) Medicines:            Monitored Anesthesia Care Complications:        No immediate complications. Procedure:            Pre-Anesthesia Assessment:                       - ASA Grade Assessment: III - A patient with severe                        systemic disease.                       After obtaining informed consent, the colonoscope was                        passed under direct vision. Throughout the procedure,                        the patient's blood pressure, pulse, and oxygen                        saturations were monitored continuously. The                        Colonoscope was introduced through the anus and                        advanced to the the terminal ileum. The colonoscopy was                        performed with moderate difficulty due to a tortuous                        colon. Successful completion of the procedure was aided                        by using manual pressure and lavage. The patient                        tolerated the procedure well. The quality of the bowel                        preparation was fair except the ascending colon was                        poor. Findings:      Inflammation characterized by congestion (edema), erythema, pseudopolyps       and shallow ulcerations was found in a continuous and circumferential       pattern from the rectum to the cecum. This was moderate in  severity.       Biopsies for histology were taken with a cold forceps from the cecum,       ascending  colon,transvers collon, right transverse colon, left       transverse colon, descending colon, sigmoid colon and rectum for       evaluation of microscopic colitis. Biopsies were taken with a cold       forceps for histology from the IC valce orifice. .      A 2 mm polyp was found in the cecum. The polyp was sessile.      The terminal ileum contained patchy pseudopolyps, erythema and atypical       mucosa.      No additional abnormalities were found on retroflexion.      The digital rectal exam was normal. Impression:           - Crohn's disease, with ileitis and colitis.                        Inflammation was found from the rectum to the cecum.                        This was moderate in severity. Biopsied.                       - One 2 mm polyp in the cecum.                       - Pseudopolyps in the terminal ileum. [Resected]. Recommendation:       - Await pathology results.                       - Continue present medications.                       - Return to GI clinic in 4 weeks. Procedure Code(s):    --- Professional ---                       [phone], Colonoscopy, flexible; with biopsy, single or                        multiple Diagnosis Code(s):    --- Professional ---                       K50.80, Crohn's disease of both small and large                        intestine without complications                       D12.0, Benign neoplasm of cecum                       K51.40, Inflammatory polyps of colon without                        complications                       Z87.19, Personal history of other diseases of the  digestive system CPT copyright 2016 [employer]. All rights reserved. The codes documented in this report are preliminary and upon coder review may  be revised to meet current compliance requirements. Lollie Sails, MD 02/22/2016 3:31:00 PM This report has been signed electronically. Number of Addenda: 0 Note Initiated  On: 02/22/2016 2:33 PM Scope Withdrawal Time: 0 hours 27 minutes 37 seconds  Total Procedure Duration: 0 hours 37 minutes 8 seconds       [facility]

## 2016-02-22 NOTE — Anesthesia Postprocedure Evaluation (Signed)
Anesthesia Post Note  Patient: [name]  Procedure(s) Performed: Procedure(s) (LRB): COLONOSCOPY WITH PROPOFOL (N/A)  Patient location during evaluation: Endoscopy Anesthesia Type: General Level of consciousness: awake and alert Pain management: pain level controlled Vital Signs Assessment: post-procedure vital signs reviewed and stable Respiratory status: spontaneous breathing, nonlabored ventilation, respiratory function stable and patient connected to nasal cannula oxygen Cardiovascular status: blood pressure returned to baseline and stable Postop Assessment: no signs of nausea or vomiting Anesthetic complications: no    Last Vitals:  Filed Vitals:   02/22/16 1547 02/22/16 1557  BP:  118/60  Pulse: 63 60  Temp:    Resp: 16 15    Last Pain:  Filed Vitals:   02/22/16 1559  PainSc: Asleep                 Concepcion Kirkpatrick S

## 2016-02-22 NOTE — Transfer of Care (Signed)
Immediate Anesthesia Transfer of Care Note  Patient: [name]  Procedure(s) Performed: Procedure(s): COLONOSCOPY WITH PROPOFOL (N/A)  Patient Location: PACU  Anesthesia Type:General  Level of Consciousness: sedated  Airway & Oxygen Therapy: Patient Spontanous Breathing and Patient connected to nasal cannula oxygen  Post-op Assessment: Report given to RN and Post -op Vital signs reviewed and stable  Post vital signs: Reviewed and stable  Last Vitals:  Filed Vitals:   02/22/16 1407  BP: 131/86  Pulse: 72  Temp: 36.3 C  Resp: 20    Complications: No apparent anesthesia complications

## 2016-02-24 ENCOUNTER — Encounter: Payer: Self-pay | Admitting: Gastroenterology

## 2016-02-25 LAB — SURGICAL PATHOLOGY

## 2016-09-27 ENCOUNTER — Inpatient Hospital Stay
Admission: EM | Admit: 2016-09-27 | Discharge: 2016-10-18 | DRG: 385 | Disposition: A | Discharge status: Skilled Nursing Facility | Payer: Medicare Other | Attending: Specialist | Admitting: Specialist

## 2016-09-27 ENCOUNTER — Emergency Department: Payer: Medicare Other

## 2016-09-27 ENCOUNTER — Inpatient Hospital Stay: Payer: Medicare Other

## 2016-09-27 ENCOUNTER — Encounter: Payer: Self-pay | Admitting: Emergency Medicine

## 2016-09-27 ENCOUNTER — Emergency Department (HOSPITAL_COMMUNITY): Payer: Medicare Other

## 2016-09-27 DIAGNOSIS — F329 Major depressive disorder, single episode, unspecified: Secondary | ICD-10-CM | POA: Diagnosis present

## 2016-09-27 DIAGNOSIS — R6521 Severe sepsis with septic shock: Secondary | ICD-10-CM

## 2016-09-27 DIAGNOSIS — R6520 Severe sepsis without septic shock: Secondary | ICD-10-CM

## 2016-09-27 DIAGNOSIS — Z9842 Cataract extraction status, left eye: Secondary | ICD-10-CM

## 2016-09-27 DIAGNOSIS — Z7984 Long term (current) use of oral hypoglycemic drugs: Secondary | ICD-10-CM

## 2016-09-27 DIAGNOSIS — N171 Acute kidney failure with acute cortical necrosis: Secondary | ICD-10-CM | POA: Diagnosis not present

## 2016-09-27 DIAGNOSIS — W06XXXA Fall from bed, initial encounter: Secondary | ICD-10-CM | POA: Diagnosis present

## 2016-09-27 DIAGNOSIS — K529 Noninfective gastroenteritis and colitis, unspecified: Secondary | ICD-10-CM | POA: Diagnosis not present

## 2016-09-27 DIAGNOSIS — Z6841 Body Mass Index (BMI) 40.0 and over, adult: Secondary | ICD-10-CM

## 2016-09-27 DIAGNOSIS — R000 Tachycardia, unspecified: Secondary | ICD-10-CM | POA: Diagnosis present

## 2016-09-27 DIAGNOSIS — E875 Hyperkalemia: Secondary | ICD-10-CM | POA: Diagnosis present

## 2016-09-27 DIAGNOSIS — E870 Hyperosmolality and hypernatremia: Secondary | ICD-10-CM | POA: Diagnosis present

## 2016-09-27 DIAGNOSIS — K625 Hemorrhage of anus and rectum: Secondary | ICD-10-CM | POA: Diagnosis present

## 2016-09-27 DIAGNOSIS — J9601 Acute respiratory failure with hypoxia: Secondary | ICD-10-CM | POA: Diagnosis present

## 2016-09-27 DIAGNOSIS — R4189 Other symptoms and signs involving cognitive functions and awareness: Secondary | ICD-10-CM

## 2016-09-27 DIAGNOSIS — E039 Hypothyroidism, unspecified: Secondary | ICD-10-CM | POA: Diagnosis present

## 2016-09-27 DIAGNOSIS — R402312 Coma scale, best motor response, none, at arrival to emergency department: Secondary | ICD-10-CM | POA: Diagnosis present

## 2016-09-27 DIAGNOSIS — N390 Urinary tract infection, site not specified: Secondary | ICD-10-CM | POA: Diagnosis present

## 2016-09-27 DIAGNOSIS — K633 Ulcer of intestine: Secondary | ICD-10-CM | POA: Diagnosis not present

## 2016-09-27 DIAGNOSIS — E785 Hyperlipidemia, unspecified: Secondary | ICD-10-CM | POA: Diagnosis present

## 2016-09-27 DIAGNOSIS — E876 Hypokalemia: Secondary | ICD-10-CM | POA: Diagnosis present

## 2016-09-27 DIAGNOSIS — Z79899 Other long term (current) drug therapy: Secondary | ICD-10-CM

## 2016-09-27 DIAGNOSIS — I452 Bifascicular block: Secondary | ICD-10-CM | POA: Diagnosis present

## 2016-09-27 DIAGNOSIS — A419 Sepsis, unspecified organism: Secondary | ICD-10-CM | POA: Diagnosis not present

## 2016-09-27 DIAGNOSIS — K429 Umbilical hernia without obstruction or gangrene: Secondary | ICD-10-CM | POA: Diagnosis present

## 2016-09-27 DIAGNOSIS — N186 End stage renal disease: Secondary | ICD-10-CM | POA: Diagnosis present

## 2016-09-27 DIAGNOSIS — R402212 Coma scale, best verbal response, none, at arrival to emergency department: Secondary | ICD-10-CM | POA: Diagnosis present

## 2016-09-27 DIAGNOSIS — I120 Hypertensive chronic kidney disease with stage 5 chronic kidney disease or end stage renal disease: Secondary | ICD-10-CM | POA: Diagnosis present

## 2016-09-27 DIAGNOSIS — Z87820 Personal history of traumatic brain injury: Secondary | ICD-10-CM | POA: Diagnosis not present

## 2016-09-27 DIAGNOSIS — E872 Acidosis, unspecified: Secondary | ICD-10-CM

## 2016-09-27 DIAGNOSIS — R197 Diarrhea, unspecified: Secondary | ICD-10-CM | POA: Diagnosis not present

## 2016-09-27 DIAGNOSIS — R402112 Coma scale, eyes open, never, at arrival to emergency department: Secondary | ICD-10-CM | POA: Diagnosis present

## 2016-09-27 DIAGNOSIS — R34 Anuria and oliguria: Secondary | ICD-10-CM | POA: Diagnosis present

## 2016-09-27 DIAGNOSIS — K5010 Crohn's disease of large intestine without complications: Secondary | ICD-10-CM | POA: Diagnosis present

## 2016-09-27 DIAGNOSIS — T380X5A Adverse effect of glucocorticoids and synthetic analogues, initial encounter: Secondary | ICD-10-CM | POA: Diagnosis not present

## 2016-09-27 DIAGNOSIS — N170 Acute kidney failure with tubular necrosis: Secondary | ICD-10-CM | POA: Diagnosis present

## 2016-09-27 DIAGNOSIS — R410 Disorientation, unspecified: Secondary | ICD-10-CM | POA: Diagnosis not present

## 2016-09-27 DIAGNOSIS — L8990 Pressure ulcer of unspecified site, unspecified stage: Secondary | ICD-10-CM | POA: Diagnosis present

## 2016-09-27 DIAGNOSIS — J9600 Acute respiratory failure, unspecified whether with hypoxia or hypercapnia: Secondary | ICD-10-CM

## 2016-09-27 DIAGNOSIS — Z992 Dependence on renal dialysis: Secondary | ICD-10-CM

## 2016-09-27 DIAGNOSIS — Z888 Allergy status to other drugs, medicaments and biological substances status: Secondary | ICD-10-CM

## 2016-09-27 DIAGNOSIS — S0101XA Laceration without foreign body of scalp, initial encounter: Secondary | ICD-10-CM | POA: Diagnosis present

## 2016-09-27 DIAGNOSIS — F203 Undifferentiated schizophrenia: Secondary | ICD-10-CM | POA: Diagnosis not present

## 2016-09-27 DIAGNOSIS — Z0189 Encounter for other specified special examinations: Secondary | ICD-10-CM

## 2016-09-27 DIAGNOSIS — J9690 Respiratory failure, unspecified, unspecified whether with hypoxia or hypercapnia: Secondary | ICD-10-CM | POA: Diagnosis present

## 2016-09-27 DIAGNOSIS — Z01818 Encounter for other preprocedural examination: Secondary | ICD-10-CM

## 2016-09-27 DIAGNOSIS — R571 Hypovolemic shock: Secondary | ICD-10-CM | POA: Diagnosis present

## 2016-09-27 DIAGNOSIS — E1122 Type 2 diabetes mellitus with diabetic chronic kidney disease: Secondary | ICD-10-CM | POA: Diagnosis present

## 2016-09-27 DIAGNOSIS — Y92193 Bedroom in other specified residential institution as the place of occurrence of the external cause: Secondary | ICD-10-CM | POA: Diagnosis not present

## 2016-09-27 DIAGNOSIS — G9340 Encephalopathy, unspecified: Secondary | ICD-10-CM | POA: Diagnosis present

## 2016-09-27 DIAGNOSIS — I4510 Unspecified right bundle-branch block: Secondary | ICD-10-CM | POA: Diagnosis present

## 2016-09-27 DIAGNOSIS — Z452 Encounter for adjustment and management of vascular access device: Secondary | ICD-10-CM

## 2016-09-27 DIAGNOSIS — K219 Gastro-esophageal reflux disease without esophagitis: Secondary | ICD-10-CM | POA: Diagnosis present

## 2016-09-27 DIAGNOSIS — Z91010 Allergy to peanuts: Secondary | ICD-10-CM

## 2016-09-27 DIAGNOSIS — N189 Chronic kidney disease, unspecified: Secondary | ICD-10-CM | POA: Diagnosis not present

## 2016-09-27 DIAGNOSIS — S062X9D Diffuse traumatic brain injury with loss of consciousness of unspecified duration, subsequent encounter: Secondary | ICD-10-CM | POA: Diagnosis not present

## 2016-09-27 DIAGNOSIS — Z23 Encounter for immunization: Secondary | ICD-10-CM | POA: Diagnosis present

## 2016-09-27 DIAGNOSIS — N179 Acute kidney failure, unspecified: Secondary | ICD-10-CM

## 2016-09-27 DIAGNOSIS — K449 Diaphragmatic hernia without obstruction or gangrene: Secondary | ICD-10-CM | POA: Diagnosis present

## 2016-09-27 DIAGNOSIS — K626 Ulcer of anus and rectum: Secondary | ICD-10-CM | POA: Diagnosis not present

## 2016-09-27 LAB — URINALYSIS COMPLETE WITH MICROSCOPIC (ARMC ONLY)
Bilirubin Urine: NEGATIVE
Glucose, UA: 50 mg/dL — AB
Hgb urine dipstick: NEGATIVE
KETONES UR: NEGATIVE mg/dL
Leukocytes, UA: NEGATIVE
NITRITE: NEGATIVE
PH: 5 (ref 5.0–8.0)
PROTEIN: 100 mg/dL — AB
SPECIFIC GRAVITY, URINE: 1.024 (ref 1.005–1.030)

## 2016-09-27 LAB — BLOOD GAS, ARTERIAL
ACID-BASE DEFICIT: 15.3 mmol/L — AB (ref 0.0–2.0)
ACID-BASE DEFICIT: 13.7 mmol/L — AB (ref 0.0–2.0)
BICARBONATE: 11.9 mmol/L — AB (ref 20.0–28.0)
BICARBONATE: 13.3 mmol/L — AB (ref 20.0–28.0)
FIO2: 1
FIO2: 0.35
LHR: 16 {breaths}/min
MECHVT: 500 mL
MECHVT: 500 mL
O2 Saturation: 99.8 %
O2 Saturation: 96.5 %
PATIENT TEMPERATURE: 37
PATIENT TEMPERATURE: 37
PCO2 ART: 34 mmHg (ref 32.0–48.0)
PEEP/CPAP: 5 cmH2O
PEEP/CPAP: 5 cmH2O
PH ART: 7.18 — AB (ref 7.350–7.450)
PH ART: 7.2 — AB (ref 7.350–7.450)
PO2 ART: 104 mmHg (ref 83.0–108.0)
RATE: 16 resp/min
pCO2 arterial: 32 mmHg (ref 32.0–48.0)
pO2, Arterial: 279 mmHg — ABNORMAL HIGH (ref 83.0–108.0)

## 2016-09-27 LAB — CBC WITH DIFFERENTIAL/PLATELET
BASOS PCT: 0 %
Basophils Absolute: 0 10*3/uL (ref 0–0.1)
Basophils Absolute: 0 10*3/uL (ref 0–0.1)
Basophils Relative: 0 %
EOS ABS: 0 10*3/uL (ref 0–0.7)
EOS ABS: 0 10*3/uL (ref 0–0.7)
EOS PCT: 0 %
EOS PCT: 0 %
HCT: 35.8 % (ref 35.0–47.0)
HCT: 35.6 % (ref 35.0–47.0)
Hemoglobin: 11.9 g/dL — ABNORMAL LOW (ref 12.0–16.0)
Hemoglobin: 11.8 g/dL — ABNORMAL LOW (ref 12.0–16.0)
LYMPHS ABS: 5.3 10*3/uL — AB (ref 1.0–3.6)
LYMPHS ABS: 2.6 10*3/uL (ref 1.0–3.6)
LYMPHS PCT: 16 %
Lymphocytes Relative: 21 %
MCH: 32.2 pg (ref 26.0–34.0)
MCH: 31.1 pg (ref 26.0–34.0)
MCHC: 33.3 g/dL (ref 32.0–36.0)
MCHC: 33.2 g/dL (ref 32.0–36.0)
MCV: 96.7 fL (ref 80.0–100.0)
MCV: 93.8 fL (ref 80.0–100.0)
MONO ABS: 1 10*3/uL — AB (ref 0.2–0.9)
MONOS PCT: 4 %
MONOS PCT: 3 %
Monocytes Absolute: 0.5 10*3/uL (ref 0.2–0.9)
Neutro Abs: 19.5 10*3/uL — ABNORMAL HIGH (ref 1.4–6.5)
Neutro Abs: 13.6 10*3/uL — ABNORMAL HIGH (ref 1.4–6.5)
Neutrophils Relative %: 75 %
Neutrophils Relative %: 81 %
PLATELETS: 283 10*3/uL (ref 150–440)
Platelets: 353 10*3/uL (ref 150–440)
RBC: 3.7 MIL/uL — ABNORMAL LOW (ref 3.80–5.20)
RBC: 3.8 MIL/uL (ref 3.80–5.20)
RDW: 15 % — AB (ref 11.5–14.5)
RDW: 14.8 % — ABNORMAL HIGH (ref 11.5–14.5)
WBC: 25.9 10*3/uL — ABNORMAL HIGH (ref 3.6–11.0)
WBC: 16.7 10*3/uL — ABNORMAL HIGH (ref 3.6–11.0)

## 2016-09-27 LAB — COMPREHENSIVE METABOLIC PANEL
ALBUMIN: 2.8 g/dL — AB (ref 3.5–5.0)
ALK PHOS: 77 U/L (ref 38–126)
ALK PHOS: 67 U/L (ref 38–126)
ALT: 19 U/L (ref 14–54)
ALT: 22 U/L (ref 14–54)
ANION GAP: 11 (ref 5–15)
AST: 48 U/L — AB (ref 15–41)
AST: 52 U/L — ABNORMAL HIGH (ref 15–41)
Albumin: 2.6 g/dL — ABNORMAL LOW (ref 3.5–5.0)
Anion gap: 17 — ABNORMAL HIGH (ref 5–15)
BUN: 47 mg/dL — AB (ref 6–20)
BUN: 50 mg/dL — ABNORMAL HIGH (ref 6–20)
CALCIUM: 8.9 mg/dL (ref 8.9–10.3)
CALCIUM: 7.2 mg/dL — AB (ref 8.9–10.3)
CHLORIDE: 103 mmol/L (ref 101–111)
CHLORIDE: 105 mmol/L (ref 101–111)
CO2: 16 mmol/L — AB (ref 22–32)
CO2: 16 mmol/L — AB (ref 22–32)
CREATININE: 4.29 mg/dL — AB (ref 0.44–1.00)
CREATININE: 3.98 mg/dL — AB (ref 0.44–1.00)
GFR calc non Af Amer: 12 mL/min — ABNORMAL LOW (ref >60)
GFR, EST AFRICAN AMERICAN: 13 mL/min — AB (ref >60)
GFR, EST AFRICAN AMERICAN: 15 mL/min — AB (ref >60)
GFR, EST NON AFRICAN AMERICAN: 13 mL/min — AB (ref >60)
GLUCOSE: 123 mg/dL — AB (ref 65–99)
Glucose, Bld: 161 mg/dL — ABNORMAL HIGH (ref 65–99)
Potassium: 5.3 mmol/L — ABNORMAL HIGH (ref 3.5–5.1)
Potassium: 5.9 mmol/L — ABNORMAL HIGH (ref 3.5–5.1)
SODIUM: 136 mmol/L (ref 135–145)
SODIUM: 132 mmol/L — AB (ref 135–145)
Total Bilirubin: 1 mg/dL (ref 0.3–1.2)
Total Bilirubin: 0.9 mg/dL (ref 0.3–1.2)
Total Protein: 6.3 g/dL — ABNORMAL LOW (ref 6.5–8.1)
Total Protein: 5.7 g/dL — ABNORMAL LOW (ref 6.5–8.1)

## 2016-09-27 LAB — PROTIME-INR
INR: 1.25
PROTHROMBIN TIME: 15.8 s — AB (ref 11.4–15.2)

## 2016-09-27 LAB — GLUCOSE, CAPILLARY
GLUCOSE-CAPILLARY: 82 mg/dL (ref 65–99)
Glucose-Capillary: 151 mg/dL — ABNORMAL HIGH (ref 65–99)
Glucose-Capillary: 164 mg/dL — ABNORMAL HIGH (ref 65–99)

## 2016-09-27 LAB — APTT: APTT: 40 s — AB (ref 24–36)

## 2016-09-27 LAB — LACTIC ACID, PLASMA
LACTIC ACID, VENOUS: 6.6 mmol/L — AB (ref 0.5–1.9)
LACTIC ACID, VENOUS: 4.5 mmol/L — AB (ref 0.5–1.9)
Lactic Acid, Venous: 2.1 mmol/L (ref 0.5–1.9)

## 2016-09-27 LAB — MAGNESIUM: Magnesium: 2.2 mg/dL (ref 1.7–2.4)

## 2016-09-27 LAB — MRSA PCR SCREENING: MRSA by PCR: NEGATIVE

## 2016-09-27 LAB — PHOSPHORUS: PHOSPHORUS: 3.2 mg/dL (ref 2.5–4.6)

## 2016-09-27 LAB — POTASSIUM
Potassium: 6.8 mmol/L (ref 3.5–5.1)
Potassium: 5.2 mmol/L — ABNORMAL HIGH (ref 3.5–5.1)

## 2016-09-27 LAB — PROCALCITONIN: Procalcitonin: 36.65 ng/mL

## 2016-09-27 LAB — TROPONIN I: Troponin I: 0.03 ng/mL (ref <0.03)

## 2016-09-27 MED ORDER — FENTANYL CITRATE (PF) 100 MCG/2ML IJ SOLN
50.0000 ug | Freq: Once | INTRAMUSCULAR | Status: AC
Start: 2016-09-27 — End: 2016-09-27
  Administered 2016-09-27: 50 ug via INTRAVENOUS

## 2016-09-27 MED ORDER — FAMOTIDINE IN NACL 20-0.9 MG/50ML-% IV SOLN: 20.0000 mg | INTRAVENOUS | Status: DC

## 2016-09-27 MED ORDER — FAMOTIDINE IN NACL 20-0.9 MG/50ML-% IV SOLN
20.0000 mg | Freq: Two times a day (BID) | INTRAVENOUS | Status: DC
  Filled 2016-09-27: qty 50

## 2016-09-27 MED ORDER — ORAL CARE MOUTH RINSE
15.0000 mL | Freq: Four times a day (QID) | OROMUCOSAL | Status: DC
  Administered 2016-09-27 – 2016-09-30 (×11): 15 mL via OROMUCOSAL

## 2016-09-27 MED ORDER — SODIUM BICARBONATE 8.4 % IV SOLN
150.0000 meq | Freq: Once | INTRAVENOUS | Status: AC
  Administered 2016-09-27: 150 meq via INTRAVENOUS

## 2016-09-27 MED ORDER — SODIUM BICARBONATE 8.4 % IV SOLN
INTRAVENOUS | Status: AC
  Administered 2016-09-27: 150 meq via INTRAVENOUS
  Filled 2016-09-27: qty 150

## 2016-09-27 MED ORDER — LIDOCAINE HCL 2 % IJ SOLN
5.0000 mL | Freq: Once | INTRAMUSCULAR | Status: AC
  Administered 2016-09-27: 100 mg
  Filled 2016-09-27: qty 10

## 2016-09-27 MED ORDER — FENTANYL CITRATE (PF) 100 MCG/2ML IJ SOLN
INTRAMUSCULAR | Status: AC
  Filled 2016-09-27: qty 2

## 2016-09-27 MED ORDER — SODIUM CHLORIDE 0.9 % IV BOLUS (SEPSIS)
250.0000 mL | Freq: Once | INTRAVENOUS | Status: AC
  Administered 2016-09-27: 250 mL via INTRAVENOUS

## 2016-09-27 MED ORDER — SODIUM CHLORIDE 0.9 % IV BOLUS (SEPSIS)
1000.0000 mL | Freq: Once | INTRAVENOUS | Status: AC
Start: 2016-09-27 — End: 2016-09-27
  Administered 2016-09-27: 1000 mL via INTRAVENOUS

## 2016-09-27 MED ORDER — VANCOMYCIN HCL IN DEXTROSE 1-5 GM/200ML-% IV SOLN
1000.0000 mg | Freq: Once | INTRAVENOUS | Status: AC
  Administered 2016-09-27: 1000 mg via INTRAVENOUS
  Filled 2016-09-27: qty 200

## 2016-09-27 MED ORDER — ONDANSETRON HCL 4 MG/2ML IJ SOLN
4.0000 mg | Freq: Four times a day (QID) | INTRAMUSCULAR | Status: DC | PRN
  Filled 2016-09-27: qty 2

## 2016-09-27 MED ORDER — FENTANYL CITRATE (PF) 100 MCG/2ML IJ SOLN
50.0000 ug | Freq: Once | INTRAMUSCULAR | Status: AC
  Administered 2016-09-27: 50 ug via INTRAVENOUS

## 2016-09-27 MED ORDER — PIPERACILLIN-TAZOBACTAM 3.375 G IVPB 30 MIN
3.3750 g | Freq: Once | INTRAVENOUS | Status: AC
  Administered 2016-09-27: 3.375 g via INTRAVENOUS
  Filled 2016-09-27: qty 50

## 2016-09-27 MED ORDER — INSULIN ASPART 100 UNIT/ML IV SOLN
10.0000 [IU] | Freq: Once | INTRAVENOUS | Status: AC
  Administered 2016-09-27: 10 [IU] via INTRAVENOUS
  Filled 2016-09-27: qty 0.1

## 2016-09-27 MED ORDER — EPINEPHRINE PF 1 MG/10ML IJ SOSY
1.0000 mg | PREFILLED_SYRINGE | Freq: Once | INTRAMUSCULAR | Status: AC
  Administered 2016-09-27: 1 mg via INTRAVENOUS

## 2016-09-27 MED ORDER — MIDAZOLAM HCL 5 MG/5ML IJ SOLN
1.0000 mg | Freq: Once | INTRAMUSCULAR | Status: AC
  Administered 2016-09-27: 1 mg via INTRAVENOUS

## 2016-09-27 MED ORDER — HEPARIN SODIUM (PORCINE) 5000 UNIT/ML IJ SOLN
5000.0000 [IU] | Freq: Three times a day (TID) | INTRAMUSCULAR | Status: DC
  Administered 2016-09-27 – 2016-10-18 (×59): 5000 [IU] via SUBCUTANEOUS
  Filled 2016-09-27 (×59): qty 1

## 2016-09-27 MED ORDER — SODIUM CHLORIDE 0.9 % IV BOLUS (SEPSIS)
1000.0000 mL | Freq: Once | INTRAVENOUS | Status: AC
  Administered 2016-09-27: 1000 mL via INTRAVENOUS

## 2016-09-27 MED ORDER — FAMOTIDINE IN NACL 20-0.9 MG/50ML-% IV SOLN
20.0000 mg | Freq: Two times a day (BID) | INTRAVENOUS | Status: DC
  Administered 2016-09-27: 20 mg via INTRAVENOUS

## 2016-09-27 MED ORDER — DEXTROSE 50 % IV SOLN
1.0000 | Freq: Once | INTRAVENOUS | Status: AC
  Administered 2016-09-27: 50 mL via INTRAVENOUS
  Filled 2016-09-27: qty 50

## 2016-09-27 MED ORDER — DOPAMINE-DEXTROSE 3.2-5 MG/ML-% IV SOLN
0.0000 ug/kg/min | Freq: Once | INTRAVENOUS | Status: AC
  Administered 2016-09-27: 5 ug/kg/min via INTRAVENOUS

## 2016-09-27 MED ORDER — PNEUMOCOCCAL VAC POLYVALENT 25 MCG/0.5ML IJ INJ: 0.5000 mL | INJECTION | INTRAMUSCULAR | Status: DC

## 2016-09-27 MED ORDER — MIDAZOLAM HCL 2 MG/2ML IJ SOLN
1.0000 mg | Freq: Once | INTRAMUSCULAR | Status: AC
  Administered 2016-09-27: 1 mg via INTRAVENOUS

## 2016-09-27 MED ORDER — EPINEPHRINE PF 1 MG/ML IJ SOLN: 1.0000 mg | Freq: Once | INTRAMUSCULAR | Status: DC

## 2016-09-27 MED ORDER — VASOPRESSIN 20 UNIT/ML IV SOLN
0.0300 [IU]/min | INTRAVENOUS | Status: DC
  Administered 2016-09-27: 0.03 [IU]/min via INTRAVENOUS
  Filled 2016-09-27: qty 2

## 2016-09-27 MED ORDER — SODIUM CHLORIDE 0.9 % IV SOLN
250.0000 mL | INTRAVENOUS | Status: DC | PRN
  Administered 2016-09-30 – 2016-10-02 (×2): 250 mL via INTRAVENOUS

## 2016-09-27 MED ORDER — PIPERACILLIN-TAZOBACTAM 3.375 G IVPB
3.3750 g | Freq: Three times a day (TID) | INTRAVENOUS | Status: DC
  Administered 2016-09-27 – 2016-10-04 (×23): 3.375 g via INTRAVENOUS
  Filled 2016-09-27 (×23): qty 50

## 2016-09-27 MED ORDER — MIDAZOLAM HCL 5 MG/5ML IJ SOLN
1.0000 mg | Freq: Once | INTRAMUSCULAR | Status: DC
  Filled 2016-09-27: qty 5

## 2016-09-27 MED ORDER — HYDROCORTISONE NA SUCCINATE PF 100 MG IJ SOLR
50.0000 mg | Freq: Four times a day (QID) | INTRAMUSCULAR | Status: DC
  Administered 2016-09-27 – 2016-10-03 (×24): 50 mg via INTRAVENOUS
  Filled 2016-09-27 (×24): qty 2

## 2016-09-27 MED ORDER — MIDAZOLAM HCL 5 MG/5ML IJ SOLN
INTRAMUSCULAR | Status: AC
Start: 2016-09-27 — End: 2016-09-27
  Filled 2016-09-27: qty 5

## 2016-09-27 MED ORDER — ACETAMINOPHEN 325 MG PO TABS
650.0000 mg | ORAL_TABLET | ORAL | Status: DC | PRN
  Administered 2016-09-27 – 2016-10-08 (×2): 650 mg via ORAL
  Filled 2016-09-27 (×2): qty 2

## 2016-09-27 MED ORDER — FENTANYL BOLUS VIA INFUSION
50.0000 ug | INTRAVENOUS | Status: DC | PRN
  Administered 2016-09-27 – 2016-09-29 (×3): 50 ug via INTRAVENOUS
  Filled 2016-09-27: qty 50

## 2016-09-27 MED ORDER — NOREPINEPHRINE BITARTRATE 1 MG/ML IV SOLN
0.0000 ug/min | INTRAVENOUS | Status: DC
  Administered 2016-09-27: 48 ug/min via INTRAVENOUS
  Administered 2016-09-27: 42 ug/min via INTRAVENOUS
  Administered 2016-09-27: 50 ug/min via INTRAVENOUS
  Administered 2016-09-28: 25 ug/min via INTRAVENOUS
  Administered 2016-09-28: 75 ug/min via INTRAVENOUS
  Administered 2016-09-28: 55 ug/min via INTRAVENOUS
  Administered 2016-09-28: 75 ug/min via INTRAVENOUS
  Administered 2016-09-29: 65 ug/min via INTRAVENOUS
  Administered 2016-09-29: 75 ug/min via INTRAVENOUS
  Administered 2016-09-29 (×2): 35 ug/min via INTRAVENOUS
  Administered 2016-09-29: 55 ug/min via INTRAVENOUS
  Administered 2016-09-30: 20 ug/min via INTRAVENOUS
  Filled 2016-09-27 (×14): qty 16

## 2016-09-27 MED ORDER — VANCOMYCIN HCL IN DEXTROSE 1-5 GM/200ML-% IV SOLN
1000.0000 mg | INTRAVENOUS | Status: DC
  Administered 2016-09-27: 1000 mg via INTRAVENOUS
  Filled 2016-09-27 (×2): qty 200

## 2016-09-27 MED ORDER — CHLORHEXIDINE GLUCONATE 0.12% ORAL RINSE (MEDLINE KIT)
15.0000 mL | Freq: Two times a day (BID) | OROMUCOSAL | Status: DC
  Administered 2016-09-27 – 2016-09-30 (×7): 15 mL via OROMUCOSAL

## 2016-09-27 MED ORDER — ETOMIDATE 2 MG/ML IV SOLN
30.0000 mg | Freq: Once | INTRAVENOUS | Status: AC
  Administered 2016-09-27: 30 mg via INTRAVENOUS

## 2016-09-27 MED ORDER — NOREPINEPHRINE 4 MG/250ML-% IV SOLN
INTRAVENOUS | Status: AC
  Filled 2016-09-27: qty 250

## 2016-09-27 MED ORDER — FENTANYL 2500MCG IN NS 250ML (10MCG/ML) PREMIX INFUSION
25.0000 ug/h | INTRAVENOUS | Status: DC
  Administered 2016-09-27: 50 ug/h via INTRAVENOUS
  Administered 2016-09-28: 175 ug/h via INTRAVENOUS
  Administered 2016-09-28 – 2016-09-29 (×2): 200 ug/h via INTRAVENOUS
  Administered 2016-09-29: 250 ug/h via INTRAVENOUS
  Filled 2016-09-27 (×5): qty 250

## 2016-09-27 MED ORDER — NOREPINEPHRINE 4 MG/250ML-% IV SOLN
0.0000 ug/min | INTRAVENOUS | Status: DC
  Administered 2016-09-27: 10 ug/min via INTRAVENOUS

## 2016-09-27 MED ORDER — SODIUM CHLORIDE 0.9% FLUSH: 3.0000 mL | INTRAVENOUS | Status: DC | PRN

## 2016-09-27 MED ORDER — SODIUM POLYSTYRENE SULFONATE 15 GM/60ML PO SUSP
30.0000 g | Freq: Once | ORAL | Status: AC
  Administered 2016-09-27: 30 g
  Filled 2016-09-27: qty 120

## 2016-09-27 MED ORDER — SODIUM CHLORIDE 0.9 % IV SOLN: 250.0000 mL | INTRAVENOUS | Status: DC | PRN

## 2016-09-27 MED ORDER — SODIUM CHLORIDE 0.9% FLUSH
3.0000 mL | Freq: Two times a day (BID) | INTRAVENOUS | Status: DC
  Administered 2016-09-27 – 2016-10-12 (×25): 3 mL via INTRAVENOUS

## 2016-09-27 MED ORDER — DEXTROSE 5 % IV SOLN: 0.0000 ug/min | Freq: Once | INTRAVENOUS | Status: DC

## 2016-09-27 MED ORDER — FENTANYL CITRATE (PF) 100 MCG/2ML IJ SOLN
50.0000 ug | Freq: Once | INTRAMUSCULAR | Status: DC
  Filled 2016-09-27: qty 2

## 2016-09-27 NOTE — ED Provider Notes (Signed)
[facility] Emergency Department Provider Note   ____________________________________________   First MD Initiated Contact with Patient 09/27/16 [phone]     (approximate)  I have reviewed the triage vital signs and the nursing notes.   HISTORY  Chief Complaint Unresponsive  History Limited by patient's unresponsiveness  HPI [name] is a 45 y.o. female who comes into the hospital today with unresponsiveness. According to EMS the patient had not felt well yesterday while she was at school. EMS was called out and she had some increased sleepiness but the patient refused to come to the emergency department. When she was at her group home she was doing okay. She did roll out of bed this evening around 2 AM and hit her head on a dresser. This morning around 5 the patient woke up to use the restroom and slumped over and became unresponsive. The patient has been urinating often and had an appointment with her doctor this morning to check out her urine. The patient is here for evaluation.   Past Medical History:  Diagnosis Date  . Abnormal mammogram, unspecified 2013   Prev. cytology,hypercellular smears without evidence of malignant cells. The cytopathologist questioned if samples truly representative. Care taken during sampling and is felt to be representative.  . Breast screening, unspecified 2013  . Broken leg    age 73  . Cellulitis   . Crohn's disease ([place]) 2013  . Depression   . Diabetes mellitus without complication (HCC)    non insulin dependent  . Early menopause   . Edema   . GERD (gastroesophageal reflux disease)   . Hiatal hernia 2013  . Hyperlipidemia   . Hypertension   . Hypothyroidism   . 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 Obesity, unspecified 2013  . Other sign and symptom in breast 2013   Right bst US,lower outer quadrant,A single 0.3x0.4x0.6cm hypoechoic mass with slightly lobulated borders with adjacent  0.3x0.4x0.5cm mass was noted. The 1st was 5cm from nipple, 2nd at 8 cm from the nipple. The previous lesion aspirate was at 3 0'clock position. These lesions are thought to account for the mammographic abnormality. Minimal interval change on [place].   [facility])   . Rib fracture    age 50  . Schizophrenia ([place])   . TBI (traumatic brain injury) ([place])   . Thyroid disease    hypothyroid  . Vitamin D deficiency     Patient Active Problem List   Diagnosis Date Noted  . Respiratory failure ([place]) 09/27/2016  . Other sign and symptom in breast     Past Surgical History:  Procedure Laterality Date  . COLONOSCOPY WITH PROPOFOL N/A 02/16/2016   Procedure: COLONOSCOPY WITH PROPOFOL;  Surgeon: Lollie Sails, MD;  Location: [facility] ENDOSCOPY;  Service: Endoscopy;  Laterality: N/A;  . COLONOSCOPY WITH PROPOFOL N/A 02/22/2016   Procedure: COLONOSCOPY WITH PROPOFOL;  Surgeon: Lollie Sails, MD;  Location: [facility] ENDOSCOPY;  Service: Endoscopy;  Laterality: N/A;  . EYE SURGERY Left 2013   cataract surgery  . TUMOR EXCISION Right    age of 28, tumor removed from [place]    Prior to Admission medications   Medication Sig Start Date End Date Taking? Authorizing Provider  acetaminophen (TYLENOL) 325 MG tablet Take 650 mg by mouth every 6 (six) hours as needed for mild pain. Every 4 to 6 hours PRN   Yes Historical Provider, MD  albuterol (PROVENTIL HFA;VENTOLIN HFA) 108 (  90 Base) MCG/ACT inhaler Inhale 1 puff into the lungs 4 (four) times daily as needed for wheezing or shortness of breath.   Yes Historical Provider, MD  calcium carbonate (OSCAL) 1500 (600 Ca) MG TABS tablet Take 600 mg of elemental calcium by mouth 2 (two) times daily with a meal.   Yes Historical Provider, MD  Cholecalciferol 5000 units TABS Take 5,000 Units by mouth daily.   Yes Historical Provider, MD  citalopram (CELEXA) 40 MG tablet Take 40 mg by mouth daily.   Yes Historical Provider, MD  cloZAPine (CLOZARIL) 100 MG tablet  Take 200 mg by mouth at bedtime.    Yes Historical Provider, MD  diphenhydrAMINE (BENADRYL) 50 MG capsule Take 50 mg by mouth at bedtime.   Yes Historical Provider, MD  furosemide (LASIX) 40 MG tablet Take 40 mg by mouth daily.   Yes Historical Provider, MD  gemfibrozil (LOPID) 600 MG tablet Take 600 mg by mouth 2 (two) times daily.   Yes Historical Provider, MD  levothyroxine (SYNTHROID, LEVOTHROID) 25 MCG tablet Take 25 mcg by mouth every morning.   Yes Historical Provider, MD  lisinopril (PRINIVIL,ZESTRIL) 5 MG tablet Take 5 mg by mouth daily.   Yes Historical Provider, MD  LORazepam (ATIVAN) 0.5 MG tablet Take 0.5 mg by mouth every 4 (four) hours as needed for anxiety.   Yes Historical Provider, MD  LORazepam (ATIVAN) 1 MG tablet Take 1 mg by mouth every morning.   Yes Historical Provider, MD  mesalamine (APRISO) 0.375 g 24 hr capsule Take 1,500 mg by mouth every morning.   Yes Historical Provider, MD  metFORMIN (GLUCOPHAGE) 500 MG tablet Take 500 mg by mouth daily.   Yes Historical Provider, MD  omega-3 acid ethyl esters (LOVAZA) 1 g capsule Take 2 g by mouth every morning.    Yes Historical Provider, MD  pantoprazole (PROTONIX) 40 MG tablet Take 40 mg by mouth daily.    Yes Historical Provider, MD  potassium chloride (K-DUR) 10 MEQ tablet Take 20 mEq by mouth 2 (two) times daily.   Yes Historical Provider, MD  pseudoephedrine-guaifenesin (MUCINEX D) 60-600 MG 12 hr tablet Take 2 tablets by mouth every 12 (twelve) hours as needed for congestion.    Yes Historical Provider, MD  risperiDONE (RISPERDAL M-TAB) 1 MG disintegrating tablet Take 1 mg by mouth 2 (two) times daily.   Yes Historical Provider, MD  simvastatin (ZOCOR) 20 MG tablet Take 20 mg by mouth at bedtime.   Yes Historical Provider, MD  spironolactone (ALDACTONE) 25 MG tablet Take 50 mg by mouth daily.   Yes Historical Provider, MD  vitamin B-12 (CYANOCOBALAMIN) 1000 MCG tablet Take 1,000 mcg by mouth daily.   Yes Historical Provider,  MD    Allergies Peanut oil and Risperidone and related  Family History  Problem Relation Age of Onset  . Cancer Mother 57    colon  . Cancer Paternal Aunt     Social History Social History  Substance Use Topics  . Smoking status: Never Smoker  . Smokeless tobacco: Never Used  . Alcohol use No    Review of Systems  Genitourinary: Urinary frequency Neurological: Increased somnolence Unable to fully assess due to patient's unresponsiveness   ____________________________________________   PHYSICAL EXAM:  VITAL SIGNS: ED Triage Vitals  Enc Vitals Group     BP 09/27/16 0557 (!) 74/43     Pulse Rate 09/27/16 0557 (!) 104     Resp 09/27/16 0557 14     Temp 09/27/16  0557 (!) 94.8 F (34.9 C)     Temp Source 09/27/16 0557 Core     SpO2 09/27/16 0557 100 %     Weight 09/27/16 0612 270 lb (122.5 kg)     Height 09/27/16 0612 5\' 6"  (1.676 m)     Head Circumference --      Peak Flow --      Pain Score --      Pain Loc --      Pain Edu? --      Excl. in [place]? --     Constitutional: Somnolent and unresponsive Eyes: Conjunctivae are normal. PERRL. EOMI. Head: Contusion to left posterior scalp Nose: No congestion/rhinnorhea. Mouth/Throat: Mucous membranes dry.  Oropharynx non-erythematous. Cardiovascular: Tachycardia with quiet heart tones and cool extremities difficult to palpate peripheral pulses Respiratory: Agonal respirations with wheezing in all lung fields Gastrointestinal: Soft and nontender. No distention. Positive bowel sounds. Musculoskeletal: No lower extremity tenderness nor edema.   Neurologic:  Patient unresponsive. She does have some agonal breathing. Her GCS is 3 Skin:  Skin is cool dry and intact. [facility]: Unresponsive  ____________________________________________   LABS (all labs ordered are listed, but only abnormal results are displayed)  Labs Reviewed  BLOOD GAS, ARTERIAL - Abnormal; Notable for the following:       Result Value   pH,  Arterial 7.18 (*)    pO2, Arterial 279 (*)    Bicarbonate 11.9 (*)    Acid-base deficit 15.3 (*)    All other components within normal limits  COMPREHENSIVE METABOLIC PANEL - Abnormal; Notable for the following:    Potassium 5.3 (*)    CO2 16 (*)    Glucose, Bld 123 (*)    BUN 47 (*)    Creatinine, Ser 4.29 (*)    Total Protein 6.3 (*)    Albumin 2.8 (*)    AST 48 (*)    GFR calc non Af Amer 12 (*)    GFR calc Af Amer 13 (*)    Anion gap 17 (*)    All other components within normal limits  CBC WITH DIFFERENTIAL/PLATELET - Abnormal; Notable for the following:    WBC 25.9 (*)    RBC 3.70 (*)    Hemoglobin 11.9 (*)    RDW 15.0 (*)    Neutro Abs 19.5 (*)    Lymphs Abs 5.3 (*)    Monocytes Absolute 1.0 (*)    All other components within normal limits  LACTIC ACID, PLASMA - Abnormal; Notable for the following:    Lactic Acid, Venous 6.6 (*)    All other components within normal limits  TROPONIN I - Abnormal; Notable for the following:    Troponin I 0.03 (*)    All other components within normal limits  CULTURE, BLOOD (ROUTINE X 2)  CULTURE, BLOOD (ROUTINE X 2)  URINE CULTURE  GLUCOSE, CAPILLARY  LACTIC ACID, PLASMA  URINALYSIS COMPLETEWITH MICROSCOPIC ([facility] ONLY)  CBC  CREATININE, SERUM   ____________________________________________  EKG  ED ECG REPORT I, Loney Hering, the attending physician, personally viewed and interpreted this ECG.   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  Procedure Name: Intubation Date/Time: 09/27/2016 6:00 AM Performed  by: Loney Hering Pre-anesthesia  Checklist: Patient identified, Emergency Drugs available, Suction available and Patient being monitored Oxygen Delivery Method: Ambu bag Preoxygenation: Pre-oxygenation with 100% oxygen Intubation Type: IV induction Ventilation: Mask ventilation without difficulty Laryngoscope Size: Glidescope and 4 Tube size: 7.5 mm Number of attempts: 1 Placement Confirmation: ETT inserted through vocal cords under direct vision,  Positive ETCO2,  CO2 detector and Breath sounds checked- equal and bilateral Secured at: 21 cm Tube secured with: ETT holder    .Central Line Date/Time: 09/27/2016 6:40 AM Performed by: Loney Hering Authorized by: Loney Hering   Consent:    Consent obtained:  Emergent situation Pre-procedure details:    Hand hygiene: Hand hygiene performed prior to insertion     Sterile barrier technique: All elements of maximal sterile technique followed     Skin preparation:  2% chlorhexidine   Skin preparation agent: Skin preparation agent completely dried prior to procedure   Anesthesia (see MAR for exact dosages):    Anesthesia method:  None Procedure details:    Location:  R internal jugular   Patient position:  Flat   Procedural supplies:  Triple lumen   Ultrasound guidance: yes     Sterile ultrasound techniques: Sterile gel and sterile probe covers were used     Number of attempts:  1   Successful placement: yes   Post-procedure details:    Post-procedure:  Dressing applied and line sutured   Assessment:  Blood return through all ports, no pneumothorax on x-ray, placement verified by x-ray and free fluid flow   Patient tolerance of procedure:  Tolerated well, no immediate complications    Critical Care performed: Yes, see critical care note(s)  CRITICAL CARE Performed by: Charlesetta Ivory P   Total critical care time: 90 minutes  Critical care time was exclusive of separately billable procedures and treating other patients.  Critical care was  necessary to treat or prevent imminent or life-threatening deterioration.  Critical care was time spent personally by me on the following activities: development of treatment plan with patient and/or surrogate as well as nursing, discussions with consultants, evaluation of patient's response to treatment, examination of patient, obtaining history from patient or surrogate, ordering and performing treatments and interventions, ordering and review of laboratory studies, ordering and review of radiographic studies, pulse oximetry and re-evaluation of patient's condition.  ____________________________________________   INITIAL IMPRESSION / ASSESSMENT AND PLAN / ED COURSE  Pertinent labs & imaging results that were available during my care of the patient were reviewed by me and considered in my medical decision making (see chart for details).  This is a 45 year old female who was brought into the hospital today with unresponsiveness. When the patient arrived she was intubated immediately. We had a difficult time finding a pulse although she was tachycardic. We did get the patient lidocaine in the event she had an intracranial injury as well as etomidate. When we noticed the patient was still very unresponsive I was able to intubate her without using a paralytic agent. The patient did not have a gag reflex at that time. Her initial temperature was found to be 94.8 which made me concerned that the patient had sepsis. A code sepsis was called and blood work was drawn on the patient. The bear hugger was placed on the patient and her temperature improved quickly. A Foley catheter was placed but the patient did not have much urine comes from her Foley catheter. The patient was also initially given 2 L  of normal saline and her blood pressure did have a momentary improvement. Once I received some of her results I ordered 2 more liters of normal saline to complete a 30 mL per kilogram bolus consistent with severe  sepsis. I also placed a central line in the patient's right internal jugular vein. The patient was placed on dopamine for a short amount of time and then on norepinephrine to help maintain her blood pressure. The patient did start pulling at her endotracheal tube so she was given some small doses of fentanyl and Versed to help with her sedation. It appears that the patient has an elevated white blood cell count as well as a significantly elevated lactic acid. She also appears to have some kidney failure with an elevated creatinine. I did send the patient for a CT scan of her head and she did receive some chest x-rays to confirm the position of her ET tube and her central line. We will admit the patient to the intensive care unit service once we received the results of her CT scan.  Clinical Course as of Sep 27 850  Tue Sep 27, 2016  [id number] No acute intracranial abnormality noted. CT Head Wo Contrast [AW]  [id number] Support devices as above, in expected position.  Very low lung volumes with bibasilar atelectasis   DG Chest Portable 1 View [AW]    Clinical Course User Index [AW] Loney Hering, MD   The patient's CT scan is unremarkable and the patient's chest x-ray is also negative. The patient will receive some Zosyn and vancomycin which is the appropriate antibiotics for an unknown sepsis. I did contact Dr. Mortimer Fries who will admit the patient to the intensive care unit service  ED Sepsis - Repeat Assessment   Performed at:    0840  Last Vitals:    Blood pressure 101/72, pulse 95, temperature 97.8 F (36.6 C), resp. rate (!) 28, height 5\' 6"  (1.676 m), weight 270 lb (122.5 kg), SpO2 95 %.  Heart:                 Regular rate and rhythm with no murmurs rubs or gallops  Lungs:     Clear to auscultation bilaterally  Capillary Refill:   Less than 2 second  Peripheral Pulse (include location): Femoral pulses 2+ unable to palpate dorsalis pedis and radial pulses.   Skin (include color):   Pale,  cool, dry   ____________________________________________   FINAL CLINICAL IMPRESSION(S) / ED DIAGNOSES  Final diagnoses:  Severe sepsis (HCC)  Acute renal failure, unspecified acute renal failure type (HCC)  Lactic acidosis  Unresponsive      NEW MEDICATIONS STARTED DURING THIS VISIT:  New Prescriptions   No medications on file     Note:  This document was prepared using Dragon voice recognition software and may include unintentional dictation errors.    Loney Hering, MD 09/27/16 [phone]

## 2016-09-27 NOTE — H&P (Signed)
PULMONARY / CRITICAL CARE MEDICINE   Name: [name] MRN: [id number] DOB: [birth year]    ADMISSION DATE:  09/27/2016    CHIEF COMPLAINT:  Acute resp failure  INITIAL PRESENTATION:HPI 45 y.o. female who comes into the hospital today with unresponsiveness. - According to EMS the patient had not felt well yesterday while she was at school.  -EMS was called out and she had some increased sleepiness but the patient refused to come to the emergency department.  -When she was at her group home she was doing okay. She did roll out of bed this evening around 2 AM and hit her head on a dresser.  -This morning around 5 the patient woke up to use the restroom and slumped over and became unresponsive. The patient has been urinating often and had an appointment with her doctor this morning to check out her urine.   Patient was unresponsive, obtunded hypoxic and hypotensive. Patient on full vent support with multiorgan failure     PAST MEDICAL HISTORY :   has a past medical history of Abnormal mammogram, unspecified (2013); Breast screening, unspecified (2013); Broken leg; Cellulitis; Crohn's disease ([place]) (2013); Depression; Diabetes mellitus without complication ([place]); Early menopause; Edema; GERD (gastroesophageal reflux disease); Hiatal hernia (2013); Hyperlipidemia; Hypertension; Hypothyroidism; Mass, eye (1990); Obesity, unspecified (2013); Other sign and symptom in breast (2013); Regional enteritis [facility]); Rib fracture; Schizophrenia ([place]); TBI (traumatic brain injury) ([place]); Thyroid disease; and Vitamin D deficiency.  has a past surgical history that includes Tumor excision (Right); Eye surgery (Left, 2013); Colonoscopy with propofol (N/A, 02/16/2016); and Colonoscopy with propofol (N/A, 02/22/2016). Prior to Admission medications   Medication Sig Start Date End Date Taking? Authorizing Provider  acetaminophen (TYLENOL) 325 MG tablet Take 650 mg by mouth every 6 (six) hours as needed for mild  pain. Every 4 to 6 hours PRN   Yes Historical Provider, MD  albuterol (PROVENTIL HFA;VENTOLIN HFA) 108 (90 Base) MCG/ACT inhaler Inhale 1 puff into the lungs 4 (four) times daily as needed for wheezing or shortness of breath.   Yes Historical Provider, MD  calcium carbonate (OSCAL) 1500 (600 Ca) MG TABS tablet Take 600 mg of elemental calcium by mouth 2 (two) times daily with a meal.   Yes Historical Provider, MD  Cholecalciferol 5000 units TABS Take 5,000 Units by mouth daily.   Yes Historical Provider, MD  citalopram (CELEXA) 40 MG tablet Take 40 mg by mouth daily.   Yes Historical Provider, MD  cloZAPine (CLOZARIL) 100 MG tablet Take 200 mg by mouth at bedtime.    Yes Historical Provider, MD  diphenhydrAMINE (BENADRYL) 50 MG capsule Take 50 mg by mouth at bedtime.   Yes Historical Provider, MD  furosemide (LASIX) 40 MG tablet Take 40 mg by mouth daily.   Yes Historical Provider, MD  gemfibrozil (LOPID) 600 MG tablet Take 600 mg by mouth 2 (two) times daily.   Yes Historical Provider, MD  levothyroxine (SYNTHROID, LEVOTHROID) 25 MCG tablet Take 25 mcg by mouth every morning.   Yes Historical Provider, MD  lisinopril (PRINIVIL,ZESTRIL) 5 MG tablet Take 5 mg by mouth daily.   Yes Historical Provider, MD  LORazepam (ATIVAN) 0.5 MG tablet Take 0.5 mg by mouth every 4 (four) hours as needed for anxiety.   Yes Historical Provider, MD  LORazepam (ATIVAN) 1 MG tablet Take 1 mg by mouth every morning.   Yes Historical Provider, MD  mesalamine (APRISO) 0.375 g 24 hr capsule Take 1,500 mg by mouth every morning.  Yes Historical Provider, MD  metFORMIN (GLUCOPHAGE) 500 MG tablet Take 500 mg by mouth daily.   Yes Historical Provider, MD  omega-3 acid ethyl esters (LOVAZA) 1 g capsule Take 2 g by mouth every morning.    Yes Historical Provider, MD  pantoprazole (PROTONIX) 40 MG tablet Take 40 mg by mouth daily.    Yes Historical Provider, MD  potassium chloride (K-DUR) 10 MEQ tablet Take 20 mEq by mouth 2  (two) times daily.   Yes Historical Provider, MD  pseudoephedrine-guaifenesin (MUCINEX D) 60-600 MG 12 hr tablet Take 2 tablets by mouth every 12 (twelve) hours as needed for congestion.    Yes Historical Provider, MD  risperiDONE (RISPERDAL M-TAB) 1 MG disintegrating tablet Take 1 mg by mouth 2 (two) times daily.   Yes Historical Provider, MD  simvastatin (ZOCOR) 20 MG tablet Take 20 mg by mouth at bedtime.   Yes Historical Provider, MD  spironolactone (ALDACTONE) 25 MG tablet Take 50 mg by mouth daily.   Yes Historical Provider, MD  vitamin B-12 (CYANOCOBALAMIN) 1000 MCG tablet Take 1,000 mcg by mouth daily.   Yes Historical Provider, MD   Allergies  Allergen Reactions  . Peanut Oil Other (See Comments)    Face turns red  . Risperidone And Related Cough    FAMILY HISTORY:  indicated that the status of her mother is unknown. She indicated that the status of her paternal aunt is unknown.   SOCIAL HISTORY:  reports that she has never smoked. She has never used smokeless tobacco. She reports that she does not drink alcohol or use drugs.  REVIEW OF SYSTEMS:   Can not provide due to critical illness    VITAL SIGNS: Temp:  [94.8 F (34.9 C)-99.5 F (37.5 C)] 98.1 F (36.7 C) (11/28 0915) Pulse Rate:  [92-105] 98 (11/28 0915) Resp:  [14-30] 30 (11/28 0915) BP: (57-101)/(26-73) 81/45 (11/28 0915) SpO2:  [91 %-100 %] 97 % (11/28 0915) FiO2 (%):  [60 %-100 %] 80 % (11/28 0854) Weight:  [270 lb (122.5 kg)] 270 lb (122.5 kg) (11/28 0612) HEMODYNAMICS:   VENTILATOR SETTINGS: Vent Mode: AC FiO2 (%):  [60 %-100 %] 80 % Set Rate:  [16 bmp] 16 bmp Vt Set:  [500 mL] 500 mL PEEP:  [5 cmH20] 5 cmH20 INTAKE / OUTPUT:  Intake/Output Summary (Last 24 hours) at 09/27/16 1113 Last data filed at 09/27/16 0732  Gross per 24 hour  Intake             2000 ml  Output                0 ml  Net             2000 ml     GENERAL:critically ill appearing, +resp distress HEAD: Normocephalic,  atraumatic.  EYES: Pupils equal, round, reactive to light.  No scleral icterus.  MOUTH: Moist mucosal membrane. NECK: Supple. No thyromegaly. No nodules. No JVD. c collar in place PULMONARY: Diffuse coarse rhonchi right sided +wheezes +rhonchi CARDIOVASCULAR: S1 and S2. Regular rate and rhythm. No murmurs, rubs, or gallops.  GASTROINTESTINAL: Soft, nontender, +distended. No masses. Positive bowel sounds. No hepatosplenomegaly.  MUSCULOSKELETAL: No swelling, clubbing, or edema.  NEUROLOGIC: GCS<8T SKIN:intact,warm,dry    LABS:  CBC  Recent Labs Lab 09/27/16 0603  WBC 25.9*  HGB 11.9*  HCT 35.8  PLT 353   Coag's No results for input(s): APTT, INR in the last 168 hours. BMET  Recent Labs Lab 09/27/16 0603  NA 136  K 5.3*  CL 103  CO2 16*  BUN 47*  CREATININE 4.29*  GLUCOSE 123*   Electrolytes  Recent Labs Lab 09/27/16 0603  CALCIUM 8.9   Sepsis Markers  Recent Labs Lab 09/27/16 0603 09/27/16 0908  LATICACIDVEN 6.6* 4.5*   ABG  Recent Labs Lab 09/27/16 0614  PHART 7.18*  PCO2ART 32  PO2ART 279*   Liver Enzymes  Recent Labs Lab 09/27/16 0603  AST 48*  ALT 19  ALKPHOS 77  BILITOT 1.0  ALBUMIN 2.8*   Cardiac Enzymes  Recent Labs Lab 09/27/16 0603  TROPONINI 0.03*   Glucose  Recent Labs Lab 09/27/16 0728 09/27/16 1038  GLUCAP 82 151*    Imaging Ct Head Wo Contrast  Result Date: 09/27/2016 CLINICAL DATA:  Recent fall with head injury, initial encounter EXAM: CT HEAD WITHOUT CONTRAST TECHNIQUE: Contiguous axial images were obtained from the base of the skull through the vertex without intravenous contrast. COMPARISON:  None. FINDINGS: Brain: No evidence of acute infarction, hemorrhage, hydrocephalus, extra-axial collection or mass lesion/mass effect. Vascular: No hyperdense vessel or unexpected calcification. Skull: Normal. Negative for fracture or focal lesion. Sinuses/Orbits: No acute finding. Other: Scattered subcutaneous  calcified lesions are noted bilaterally these have a benign appearance. IMPRESSION: No acute intracranial abnormality noted. Electronically Signed   By: Inez Catalina M.D.   On: 09/27/2016 08:25   Dg Chest Port 1 View  Result Date: 09/27/2016 CLINICAL DATA:  45 year old female status post intubation EXAM: PORTABLE CHEST 1 VIEW COMPARISON:  CT 07/29/2012. Note that this study is reported after the subsequent chest x-ray of the same date. FINDINGS: Endotracheal tube in position measuring approximately 4.2 cm above the carina. Overlying EKG leads. Low lung volumes with linear opacities at the lung bases. No pneumothorax. No large pleural effusion or confluent airspace disease. IMPRESSION: Endotracheal tube terminates suitably above the carina, approximately 4.2 cm. Low lung volumes with basilar atelectasis. Signed, Dulcy Fanny. Earleen Newport, DO [facility] Electronically Signed   By: Corrie Mckusick D.O.   On: 09/27/2016 08:47   Dg Chest Portable 1 View  Result Date: 09/27/2016 CLINICAL DATA:  Central line placement EXAM: PORTABLE CHEST 1 VIEW COMPARISON:  07/27/2012 FINDINGS: Endotracheal tube is 3.4 cm above the carina. Right central line is in place with the tip at the cavoatrial junction. No pneumothorax. Very low lung volumes with bibasilar atelectasis. Heart size is borderline enlarged. No visible effusions. IMPRESSION: Support devices as above, in expected position. Very low lung volumes with bibasilar atelectasis. Electronically Signed   By: Rolm Baptise M.D.   On: 09/27/2016 07:33     ASSESSMENT / PLAN:   45 yo obese white female admitted to ICU for acute septic shock from UTI with severe metabolic acidosis and acute encephalopathy with acute resp failure  PULMONARY 1.Respiratory Failure -continue Full MV support -continue Bronchodilator Therapy -Wean Fio2 and PEEP as tolerated   CARDIOVASCULAR Septic shock -use vasopressors to keep  MAP>65 -follow ABG and LA -follow up cultures -emperic ABX -consider stress dose steroids -add vasopressin    RENAL Renal Failure-most likely due to ATN -follow chem 7 -follow UO -continue Foley Catheter-assess need   GASTROINTESTINAL Place OG and start Feeds  HEMATOLOGIC Follow cbc  INFECTIOUS UTI -empiric abx -follow up cultures  ENDOCRINE - ICU hypoglycemic\Hyperglycemia protocol   NEUROLOGIC - intubated and sedated - minimal sedation to achieve a RASS goal: -1  I have personally obtained a history, examined the patient, evaluated Pertinent laboratory and [employer]/imaging results,  and  formulated the assessment and plan   The Patient requires high complexity decision making for assessment and support, frequent evaluation and titration of therapies, application of advanced monitoring technologies and extensive interpretation of multiple databases. Critical Care Time devoted to patient care services described in this note is 50 minutes.   Overall, patient is critically ill, prognosis is guarded.  Patient with Multiorgan failure and at high risk for cardiac arrest and death.  Repeat all labs pending  Nadiya Pieratt Patricia Pesa, M.D.  [facility]  Medical Director [place] Director [place] Department

## 2016-09-27 NOTE — Progress Notes (Signed)
   Attempted to place arterial line X2 but was unable to thread the guidewire in.  Aborted the procedure.   [facility]

## 2016-09-27 NOTE — Care Management (Addendum)
Patient is from [facility].  Owner is Janetta Hora [phone]. Spoke with [facility]- caregiver that usually assists patient on a daily basis. Patient usually ambulates with walker. Needs assistance with ADLs per Evette. Feeds self. Per Evette patient was complaining of her stomach hurting and frequent urination with burning sensation. O2 acute.  Patient attends [employer] classes every day.  PCP is with [facility]. Was having problems "pooping" per Evette. [name] is patient's aunt in [place] per [place] and she is patient's HCPOA- there is no contact number on file. There is also a Guardian listed at [place] which was not mentioned by Evette. They use [facility] for [facility] health services per [facility].  RNCM will continue to follow.

## 2016-09-27 NOTE — Therapy (Signed)
Patient transported to and from CT scan via Trilogy vent, remained on vent at ordered settings throughout without incident. MVB/mask available at all times. Upon return to room, patient suctioned for scant secretions, biltat breath sounds appreciated/clear. Patient with labile B/P on vasopressors, tolerated transport well.

## 2016-09-27 NOTE — ED Notes (Signed)
Returned from CT, RT at bedside for suction

## 2016-09-27 NOTE — ED Notes (Signed)
Group home member at bedside, awaiting RT for transport to CT

## 2016-09-27 NOTE — Progress Notes (Signed)
[place], NP at bedside at shift change at 1900 and RN made her aware of variable blood pressures recently and that RN has tried cuff pressures in the arms, wrists and legs.  Patient arouses to voice and follows commands. RN asked about A-line.  [place], NP stated to set up for arterial line. Report given to [facility], RN who is now taking over patient's care.

## 2016-09-27 NOTE — ED Triage Notes (Signed)
Pt presents to ED via [facility] from group home, per EMS pt went to school yesterday and CNA's reported pt had urinary symptoms, EMS was called out to school and pt refused transportation. Per EMS pt got up this morning to use bedside commode when pt fell and hit back of head on nightstand. Pt became unresponsive 30 min PTA from EMS. EMS reports baseline pt ambulates without assistance and speaks coherently.

## 2016-09-27 NOTE — Progress Notes (Signed)
RN called [facility] and spoke with Cherylann Parr, RN and made her aware that potassium rechecked and is 6.8 and CVP checked and is 5 mmHg.  Cherylann Parr, RN to make Dr. Ashby Dawes aware of potassium.

## 2016-09-27 NOTE — Progress Notes (Signed)
Pharmacy Antibiotic Note  [name] is a 45 y.o. female admitted on 09/27/2016 with sepsis likely 2/2 UTI with severe metabolic acidosis and acute encephalopathy. Pharmacy has been consulted for vancomycin and piperacillin/tazobactam dosing. Pt is currently on full vent support with multiorgan failure. Pt received vancomycin 1000 mg IV and piperacillin/tazobactam 3.375 mg IV x 1  Plan: Will initiate piperacillin/tazobactam 3.375 g IE q 8 hours  Will begin vancomycin 1000 mg IV q 36 hours (~6 hour stacked dosing). Pt currently experiencing acute kidney injury. Will monitor for improvement and dose adjustment as patient may need more frequent dosing with improved kidney function. BMP ordered with am labs. Will also f/u to order trough level with goal of 15-20 T1/2 = 29 Vd = 59.7 Ke = 0.024  Height: 5\' 6"  (167.6 cm) Weight: 274 lb 4 oz (124.4 kg) IBW/kg (Calculated) : 59.3  Temp (24hrs), Avg:98.2 F (36.8 C), Min:94.8 F (34.9 C), Max:99.5 F (37.5 C)   Recent Labs Lab 09/27/16 0603 09/27/16 0908 09/27/16 1109  WBC 25.9*  --  16.7*  CREATININE 4.29*  --  3.98*  LATICACIDVEN 6.6* 4.5* 2.1*    Estimated Creatinine Clearance: 24 mL/min (by C-G formula based on SCr of 3.98 mg/dL (H)).    Allergies  Allergen Reactions  . Peanut Oil Other (See Comments)    Face turns red  . Risperidone And Related Cough    Antimicrobials this admission: Vancomycin 11/28 >>  Piperacillin/tazo 11/28 >>   Dose adjustments this admission:   Microbiology results: 11/28 BCx: pending 11/28 UCx: pending  11/28 MRSA PCR: negative  Thank you for allowing pharmacy to be a part of this patient's care.  Darrow Bussing, PharmD Pharmacy Resident 09/27/2016 1:47 PM

## 2016-09-27 NOTE — Progress Notes (Signed)
RN spoke with Dr. Mortimer Fries and made MD aware of K+ 5.9 and MD stated he would talk with Dr. Alva Garnet and he would follow up.

## 2016-09-27 NOTE — Progress Notes (Signed)
[facility] Progress Note Patient Name: [name] DOB: Apr 15, 1971 MRN: [id number]   Date of Service  09/27/2016  HPI/Events of Note  Hyperkalemia with acidosis.   eICU Interventions  Administered insulin, D50, bicarb, kayexalate.      Intervention Category Major Interventions: Acid-Base disturbance - evaluation and management;Electrolyte abnormality - evaluation and management  Laverle Hobby 09/27/2016, 6:31 PM

## 2016-09-27 NOTE — Progress Notes (Signed)
[facility] responded to an OR for prayer in IC-01. Pt was intubated and non-responsive. RN said no family or visitors had been in today. [place] sat with the patient for a few minutes and offered a silent prayer at bedside. CH is available for follow up as needed.    09/27/16 1500  Clinical Encounter Type  Visited With Patient;Health care provider  Visit Type Initial;Spiritual support  Referral From Nurse  Spiritual Encounters  Spiritual Needs Prayer

## 2016-09-28 ENCOUNTER — Inpatient Hospital Stay: Payer: Medicare Other

## 2016-09-28 DIAGNOSIS — N170 Acute kidney failure with tubular necrosis: Secondary | ICD-10-CM

## 2016-09-28 LAB — GLUCOSE, CAPILLARY
GLUCOSE-CAPILLARY: 127 mg/dL — AB (ref 65–99)
GLUCOSE-CAPILLARY: 128 mg/dL — AB (ref 65–99)
GLUCOSE-CAPILLARY: 138 mg/dL — AB (ref 65–99)
Glucose-Capillary: 172 mg/dL — ABNORMAL HIGH (ref 65–99)

## 2016-09-28 LAB — CBC
HEMATOCRIT: 35.5 % (ref 35.0–47.0)
HEMOGLOBIN: 11.9 g/dL — AB (ref 12.0–16.0)
MCH: 31.2 pg (ref 26.0–34.0)
MCHC: 33.5 g/dL (ref 32.0–36.0)
MCV: 93 fL (ref 80.0–100.0)
Platelets: 300 10*3/uL (ref 150–440)
RBC: 3.82 MIL/uL (ref 3.80–5.20)
RDW: 15 % — ABNORMAL HIGH (ref 11.5–14.5)
WBC: 10.9 10*3/uL (ref 3.6–11.0)

## 2016-09-28 LAB — BASIC METABOLIC PANEL
ANION GAP: 12 (ref 5–15)
BUN: 57 mg/dL — ABNORMAL HIGH (ref 6–20)
CHLORIDE: 106 mmol/L (ref 101–111)
CO2: 17 mmol/L — AB (ref 22–32)
Calcium: 6.2 mg/dL — CL (ref 8.9–10.3)
Creatinine, Ser: 4.01 mg/dL — ABNORMAL HIGH (ref 0.44–1.00)
GFR calc non Af Amer: 12 mL/min — ABNORMAL LOW (ref >60)
GFR, EST AFRICAN AMERICAN: 14 mL/min — AB (ref >60)
GLUCOSE: 202 mg/dL — AB (ref 65–99)
POTASSIUM: 5.8 mmol/L — AB (ref 3.5–5.1)
Sodium: 135 mmol/L (ref 135–145)

## 2016-09-28 LAB — BLOOD GAS, ARTERIAL
ACID-BASE DEFICIT: 11.1 mmol/L — AB (ref 0.0–2.0)
BICARBONATE: 15.4 mmol/L — AB (ref 20.0–28.0)
FIO2: 0.35
MECHVT: 500 mL
Mechanical Rate: 16
O2 SAT: 96.4 %
PATIENT TEMPERATURE: 37
PCO2 ART: 36 mmHg (ref 32.0–48.0)
PEEP: 5 cmH2O
PH ART: 7.24 — AB (ref 7.350–7.450)
PO2 ART: 99 mmHg (ref 83.0–108.0)

## 2016-09-28 LAB — RENAL FUNCTION PANEL
ALBUMIN: 2.1 g/dL — AB (ref 3.5–5.0)
ALBUMIN: 2.1 g/dL — AB (ref 3.5–5.0)
ANION GAP: 12 (ref 5–15)
ANION GAP: 12 (ref 5–15)
ANION GAP: 11 (ref 5–15)
Albumin: 2 g/dL — ABNORMAL LOW (ref 3.5–5.0)
BUN: 64 mg/dL — AB (ref 6–20)
BUN: 62 mg/dL — ABNORMAL HIGH (ref 6–20)
BUN: 52 mg/dL — ABNORMAL HIGH (ref 6–20)
CALCIUM: 6.8 mg/dL — AB (ref 8.9–10.3)
CHLORIDE: 105 mmol/L (ref 101–111)
CO2: 17 mmol/L — AB (ref 22–32)
CO2: 16 mmol/L — AB (ref 22–32)
CO2: 19 mmol/L — ABNORMAL LOW (ref 22–32)
Calcium: 6.5 mg/dL — ABNORMAL LOW (ref 8.9–10.3)
Calcium: 6 mg/dL — CL (ref 8.9–10.3)
Chloride: 103 mmol/L (ref 101–111)
Chloride: 102 mmol/L (ref 101–111)
Creatinine, Ser: 4.45 mg/dL — ABNORMAL HIGH (ref 0.44–1.00)
Creatinine, Ser: 4.6 mg/dL — ABNORMAL HIGH (ref 0.44–1.00)
Creatinine, Ser: 3.84 mg/dL — ABNORMAL HIGH (ref 0.44–1.00)
GFR calc Af Amer: 13 mL/min — ABNORMAL LOW (ref >60)
GFR calc non Af Amer: 11 mL/min — ABNORMAL LOW (ref >60)
GFR, EST AFRICAN AMERICAN: 12 mL/min — AB (ref >60)
GFR, EST AFRICAN AMERICAN: 15 mL/min — AB (ref >60)
GFR, EST NON AFRICAN AMERICAN: 11 mL/min — AB (ref >60)
GFR, EST NON AFRICAN AMERICAN: 13 mL/min — AB (ref >60)
GLUCOSE: 157 mg/dL — AB (ref 65–99)
GLUCOSE: 174 mg/dL — AB (ref 65–99)
Glucose, Bld: 145 mg/dL — ABNORMAL HIGH (ref 65–99)
PHOSPHORUS: 5.5 mg/dL — AB (ref 2.5–4.6)
PHOSPHORUS: 5.1 mg/dL — AB (ref 2.5–4.6)
POTASSIUM: 5.5 mmol/L — AB (ref 3.5–5.1)
POTASSIUM: 5.3 mmol/L — AB (ref 3.5–5.1)
POTASSIUM: 4.7 mmol/L (ref 3.5–5.1)
Phosphorus: 5.7 mg/dL — ABNORMAL HIGH (ref 2.5–4.6)
SODIUM: 132 mmol/L — AB (ref 135–145)
Sodium: 132 mmol/L — ABNORMAL LOW (ref 135–145)
Sodium: 133 mmol/L — ABNORMAL LOW (ref 135–145)

## 2016-09-28 LAB — URINE CULTURE: Culture: NO GROWTH

## 2016-09-28 LAB — PROCALCITONIN: PROCALCITONIN: 54.54 ng/mL

## 2016-09-28 MED ORDER — SODIUM CHLORIDE 0.9 % IV SOLN
1.0000 g | Freq: Once | INTRAVENOUS | Status: AC
  Administered 2016-09-28: 1 g via INTRAVENOUS
  Filled 2016-09-28: qty 10

## 2016-09-28 MED ORDER — PNEUMOCOCCAL VAC POLYVALENT 25 MCG/0.5ML IJ INJ
0.5000 mL | INJECTION | INTRAMUSCULAR | Status: AC
  Administered 2016-09-30: 0.5 mL via INTRAMUSCULAR
  Filled 2016-09-28: qty 0.5

## 2016-09-28 MED ORDER — INSULIN REGULAR HUMAN 100 UNIT/ML IJ SOLN
10.0000 [IU] | Freq: Once | INTRAMUSCULAR | Status: AC
Start: 2016-09-28 — End: 2016-09-28
  Administered 2016-09-28: 10 [IU] via INTRAVENOUS
  Filled 2016-09-28: qty 0.1

## 2016-09-28 MED ORDER — FAMOTIDINE IN NACL 20-0.9 MG/50ML-% IV SOLN
20.0000 mg | INTRAVENOUS | Status: DC
  Administered 2016-09-29 – 2016-10-01 (×2): 20 mg via INTRAVENOUS
  Filled 2016-09-28 (×2): qty 50

## 2016-09-28 MED ORDER — PHENYLEPHRINE HCL 10 MG/ML IJ SOLN
30.0000 ug/min | INTRAVENOUS | Status: DC
  Administered 2016-09-28: 125 ug/min via INTRAVENOUS
  Administered 2016-09-28: 30 ug/min via INTRAVENOUS
  Filled 2016-09-28 (×2): qty 1

## 2016-09-28 MED ORDER — SODIUM CHLORIDE 0.9 % IV SOLN
0.0400 [IU]/min | INTRAVENOUS | Status: DC
  Administered 2016-09-28 – 2016-10-03 (×7): 0.04 [IU]/min via INTRAVENOUS
  Administered 2016-10-04 (×2): 0.03 [IU]/min via INTRAVENOUS
  Filled 2016-09-28 (×11): qty 2

## 2016-09-28 MED ORDER — DEXTROSE 5 % IV SOLN
30.0000 ug/min | INTRAVENOUS | Status: DC
  Administered 2016-09-28: 130 ug/min via INTRAVENOUS
  Administered 2016-09-28 (×2): 160 ug/min via INTRAVENOUS
  Administered 2016-09-28 (×2): 150 ug/min via INTRAVENOUS
  Administered 2016-09-29: 170 ug/min via INTRAVENOUS
  Administered 2016-09-29: 130 ug/min via INTRAVENOUS
  Administered 2016-09-29: 60 ug/min via INTRAVENOUS
  Administered 2016-09-29: 180 ug/min via INTRAVENOUS
  Filled 2016-09-28 (×10): qty 4

## 2016-09-28 MED ORDER — SODIUM CHLORIDE 0.9 % IV BOLUS (SEPSIS)
1000.0000 mL | Freq: Once | INTRAVENOUS | Status: AC
  Administered 2016-09-28: 1000 mL via INTRAVENOUS

## 2016-09-28 MED ORDER — HEPARIN SODIUM (PORCINE) 1000 UNIT/ML DIALYSIS
1000.0000 [IU] | INTRAMUSCULAR | Status: DC | PRN
  Administered 2016-10-01: 1000 [IU] via INTRAVENOUS_CENTRAL

## 2016-09-28 MED ORDER — INSULIN ASPART 100 UNIT/ML ~~LOC~~ SOLN
2.0000 [IU] | SUBCUTANEOUS | Status: DC
  Administered 2016-09-28 (×2): 2 [IU] via SUBCUTANEOUS
  Administered 2016-09-28: 6 [IU] via SUBCUTANEOUS
  Administered 2016-09-29 (×2): 2 [IU] via SUBCUTANEOUS
  Filled 2016-09-28 (×3): qty 2
  Filled 2016-09-28: qty 6
  Filled 2016-09-28: qty 2

## 2016-09-28 MED ORDER — PUREFLOW DIALYSIS SOLUTION
INTRAVENOUS | Status: DC
  Administered 2016-09-28 – 2016-09-29 (×3): via INTRAVENOUS_CENTRAL

## 2016-09-28 MED ORDER — SODIUM CHLORIDE 0.9 % IV BOLUS (SEPSIS)
500.0000 mL | Freq: Once | INTRAVENOUS | Status: AC
  Administered 2016-09-28: 500 mL via INTRAVENOUS

## 2016-09-28 MED ORDER — SODIUM CHLORIDE 0.9 % IV SOLN
INTRAVENOUS | Status: DC
  Administered 2016-09-28 – 2016-09-29 (×2): via INTRAVENOUS

## 2016-09-28 MED ORDER — VANCOMYCIN HCL IN DEXTROSE 1-5 GM/200ML-% IV SOLN
1000.0000 mg | INTRAVENOUS | Status: DC
  Administered 2016-09-28 – 2016-09-29 (×2): 1000 mg via INTRAVENOUS
  Filled 2016-09-28 (×3): qty 200

## 2016-09-28 MED ORDER — DEXTROSE 50 % IV SOLN
25.0000 mL | Freq: Once | INTRAVENOUS | Status: AC
  Administered 2016-09-28: 25 mL via INTRAVENOUS
  Filled 2016-09-28: qty 50

## 2016-09-28 MED FILL — Medication: Qty: 1 | Status: AC

## 2016-09-28 NOTE — Consult Note (Signed)
[facility] CONSULT NOTE    Date: 09/28/2016                  Patient Name:  [name]  MRN: [id number]  DOB: 08/14/1971  Age / Sex: 45 y.o., female         PCP: Lorelee Market, MD                 Service Requesting Consult: Pulmonary/critical care                 Reason for Consult: Acute renal failure, acidosis, hyperkalemia in the setting of septic shock            History of Present Illness: Patient is a 45 y.o. female with a PMHx of Crohn's disease, depression, diabetes mellitus type 2, GERD, hiatal hernia, hyperlipidemia, hypertension, hypothyroidism, obesity, schizophrenia, traumatic brain injury, vitamin D deficiency, who was admitted to [facility] on 09/27/2016 for evaluation of acute respiratory failure. Patient is currently intubated, sedated, and on the ventilator. She is unable to provide any history. She apparently came in yesterday with unresponsiveness. She resides at a group home. Yesterday at 5 AM she woke up to use the restroom and slump over and unfortunately became unresponsive. She is now on multiple pressors and requiring ventilatory support. We are asked to see her for acute renal failure. The patient's baseline creatinine appears to be 1.1. Upon presentation creatinine was 4.29. At the moment creatinine is 4.01. EGFR is low at 12. She also has an elevated pro-calcitonin level of 54. There is also evidence of acidosis with serum bicarbonate of 17 and pH of 7.24. Patient also has hyperkalemia with a serum potassium of 5.8. I discussed care of the patient with her healthcare power of attorney [name] who resides in [place].  She has consented for CRRT and dialysis catheter placement.    Medications: Outpatient medications: Prescriptions Prior to Admission  Medication Sig Dispense Refill Last Dose  . acetaminophen (TYLENOL) 325 MG tablet Take 650 mg by mouth every 6 (six) hours as needed for mild pain. Every 4 to 6 hours PRN   PRN at  PRN  . albuterol (PROVENTIL HFA;VENTOLIN HFA) 108 (90 Base) MCG/ACT inhaler Inhale 1 puff into the lungs 4 (four) times daily as needed for wheezing or shortness of breath.   PRN at PRN  . calcium carbonate (OSCAL) 1500 (600 Ca) MG TABS tablet Take 600 mg of elemental calcium by mouth 2 (two) times daily with a meal.   09/26/2016 at 1700  . Cholecalciferol 5000 units TABS Take 5,000 Units by mouth daily.   09/26/2016 at 0800  . citalopram (CELEXA) 40 MG tablet Take 40 mg by mouth daily.   09/26/2016 at 0800  . cloZAPine (CLOZARIL) 100 MG tablet Take 200 mg by mouth at bedtime.    09/25/2016 at 2000  . diphenhydrAMINE (BENADRYL) 50 MG capsule Take 50 mg by mouth at bedtime.   09/26/2016 at 2000  . furosemide (LASIX) 40 MG tablet Take 40 mg by mouth daily.   09/26/2016 at 0800  . gemfibrozil (LOPID) 600 MG tablet Take 600 mg by mouth 2 (two) times daily.   09/26/2016 at 2000  . levothyroxine (SYNTHROID, LEVOTHROID) 25 MCG tablet Take 25 mcg by mouth every morning.   09/26/2016 at 0700  . lisinopril (PRINIVIL,ZESTRIL) 5 MG tablet Take 5 mg by mouth daily.   09/26/2016 at 0800  . LORazepam (ATIVAN) 0.5 MG tablet Take 0.5 mg by mouth  every 4 (four) hours as needed for anxiety.   PRN at PRN  . LORazepam (ATIVAN) 1 MG tablet Take 1 mg by mouth every morning.   09/26/2016 at 0800  . mesalamine (APRISO) 0.375 g 24 hr capsule Take 1,500 mg by mouth every morning.   09/26/2016 at 0800  . metFORMIN (GLUCOPHAGE) 500 MG tablet Take 500 mg by mouth daily.   09/26/2016 at 0800  . omega-3 acid ethyl esters (LOVAZA) 1 g capsule Take 2 g by mouth every morning.    09/26/2016 at 0800  . pantoprazole (PROTONIX) 40 MG tablet Take 40 mg by mouth daily.    09/26/2016 at 0700  . potassium chloride (K-DUR) 10 MEQ tablet Take 20 mEq by mouth 2 (two) times daily.   09/26/2016 at 1700  . pseudoephedrine-guaifenesin (MUCINEX D) 60-600 MG 12 hr tablet Take 2 tablets by mouth every 12 (twelve) hours as needed for congestion.     PRN at PRN  . risperiDONE (RISPERDAL M-TAB) 1 MG disintegrating tablet Take 1 mg by mouth 2 (two) times daily.   09/26/2016 at 2000  . simvastatin (ZOCOR) 20 MG tablet Take 20 mg by mouth at bedtime.   09/26/2016 at 2000  . spironolactone (ALDACTONE) 25 MG tablet Take 50 mg by mouth daily.   09/26/2016 at 0800  . vitamin B-12 (CYANOCOBALAMIN) 1000 MCG tablet Take 1,000 mcg by mouth daily.   09/26/2016 at 0800    Current medications: Current Facility-Administered Medications  Medication Dose Route Frequency Provider Last Rate Last Dose  . 0.9 %  sodium chloride infusion  250 mL Intravenous PRN Flora Lipps, MD      . 0.9 %  sodium chloride infusion  250 mL Intravenous PRN Flora Lipps, MD      . 0.9 %  sodium chloride infusion   Intravenous Continuous Bincy S Varughese, NP 50 mL/hr at 09/28/16 0055    . acetaminophen (TYLENOL) tablet 650 mg  650 mg Oral Q4H PRN Flora Lipps, MD   650 mg at 09/27/16 1805  . chlorhexidine gluconate (MEDLINE KIT) (PERIDEX) 0.12 % solution 15 mL  15 mL Mouth Rinse BID Flora Lipps, MD   15 mL at 09/28/16 0911  . [START ON 09/29/2016] famotidine (PEPCID) IVPB 20 mg premix  20 mg Intravenous Q48H Merilyn Baba, RPH      . fentaNYL (SUBLIMAZE) bolus via infusion 50 mcg  50 mcg Intravenous Q1H PRN Flora Lipps, MD   50 mcg at 09/27/16 1543  . fentaNYL (SUBLIMAZE) injection 50 mcg  50 mcg Intravenous Once Flora Lipps, MD      . fentaNYL 2575mg in NS 2529m(1072mml) infusion-PREMIX  25-400 mcg/hr Intravenous Continuous KurFlora LippsD 7.5 mL/hr at 09/27/16 2235 75 mcg/hr at 09/27/16 2235  . heparin injection 5,000 Units  5,000 Units Subcutaneous Q8H KurFlora LippsD   5,000 Units at 09/28/16 0524  . hydrocortisone sodium succinate (SOLU-CORTEF) 100 MG injection 50 mg  50 mg Intravenous Q6H KurFlora LippsD   50 mg at 09/28/16 0522  . MEDLINE mouth rinse  15 mL Mouth Rinse QID KurFlora LippsD   15 mL at 09/28/16 0400  . midazolam (VERSED) 5 MG/5ML injection 1 mg  1 mg  Intravenous Once AllLoney HeringD      . norepinephrine (LEVOPHED) 16 mg in dextrose 5 % 250 mL (0.064 mg/mL) infusion  0-60 mcg/min Intravenous Titrated KurFlora LippsD 46.9 mL/hr at 09/28/16 0641 50 mcg/min at 09/28/16 0641  . ondansetron (ZOFRAN) injection  4 mg  4 mg Intravenous Q6H PRN Flora Lipps, MD      . phenylephrine (NEO-SYNEPHRINE) 40 mg in dextrose 5 % 250 mL (0.16 mg/mL) infusion  30-200 mcg/min Intravenous Continuous Bincy S Varughese, NP 56.3 mL/hr at 09/28/16 0908 150 mcg/min at 09/28/16 0908  . piperacillin-tazobactam (ZOSYN) IVPB 3.375 g  3.375 g Intravenous Q8H Merilyn Baba, RPH   3.375 g at 09/28/16 0908  . pneumococcal 23 valent vaccine (PNU-IMMUNE) injection 0.5 mL  0.5 mL Intramuscular Tomorrow-1000 Flora Lipps, MD      . sodium chloride flush (NS) 0.9 % injection 3 mL  3 mL Intravenous Q12H Flora Lipps, MD   3 mL at 09/28/16 0913  . sodium chloride flush (NS) 0.9 % injection 3 mL  3 mL Intravenous PRN Flora Lipps, MD      . vancomycin (VANCOCIN) IVPB 1000 mg/200 mL premix  1,000 mg Intravenous Q36H Merilyn Baba, RPH   1,000 mg at 09/27/16 1431  . vasopressin (PITRESSIN) 40 Units in sodium chloride 0.9 % 250 mL (0.16 Units/mL) infusion  0.04 Units/min Intravenous Continuous Bincy S Varughese, NP 15 mL/hr at 09/28/16 0640 0.04 Units/min at 09/28/16 0640      Allergies: Allergies  Allergen Reactions  . Peanut Oil Other (See Comments)    Face turns red  . Risperidone And Related Cough      Past Medical History: Past Medical History:  Diagnosis Date  . Abnormal mammogram, unspecified 2013   Prev. cytology,hypercellular smears without evidence of malignant cells. The cytopathologist questioned if samples truly representative. Care taken during sampling and is felt to be representative.  . Breast screening, unspecified 2013  . Broken leg    age 65  . Cellulitis   . Crohn's disease ([place]) 2013  . Depression   . Diabetes mellitus without complication (HCC)     non insulin dependent  . Early menopause   . Edema   . GERD (gastroesophageal reflux disease)   . Hiatal hernia 2013  . Hyperlipidemia   . Hypertension   . Hypothyroidism   . 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 Obesity, unspecified 2013  . Other sign and symptom in breast 2013   Right bst US,lower outer quadrant,A single 0.3x0.4x0.6cm hypoechoic mass with slightly lobulated borders with adjacent 0.3x0.4x0.5cm mass was noted. The 1st was 5cm from nipple, 2nd at 8 cm from the nipple. The previous lesion aspirate was at 3 0'clock position. These lesions are thought to account for the mammographic abnormality. Minimal interval change on [place].   [facility])   . Rib fracture    age 22  . Schizophrenia ([place])   . TBI (traumatic brain injury) ([place])   . Thyroid disease    hypothyroid  . Vitamin D deficiency      Past Surgical History: Past Surgical History:  Procedure Laterality Date  . COLONOSCOPY WITH PROPOFOL N/A 02/16/2016   Procedure: COLONOSCOPY WITH PROPOFOL;  Surgeon: Lollie Sails, MD;  Location: [facility] ENDOSCOPY;  Service: Endoscopy;  Laterality: N/A;  . COLONOSCOPY WITH PROPOFOL N/A 02/22/2016   Procedure: COLONOSCOPY WITH PROPOFOL;  Surgeon: Lollie Sails, MD;  Location: [facility] ENDOSCOPY;  Service: Endoscopy;  Laterality: N/A;  . EYE SURGERY Left 2013   cataract surgery  . TUMOR EXCISION Right    age of 29, tumor removed from RUE     Family History: Family History  Problem Relation Age of Onset  . Cancer Mother 51  colon  . Cancer Paternal Aunt      Social History: Social History   Social History  . Marital status: Single    Spouse name: N/A  . Number of children: N/A  . Years of education: N/A   Occupational History  . Not on file.   Social History Main Topics  . Smoking status: Never Smoker  . Smokeless tobacco: Never Used  . Alcohol use No  . Drug use: No  . Sexual activity: Not on file   Other Topics  Concern  . Not on file   Social History Narrative  . No narrative on file     Review of Systems: Patient unable to provide as she's intubated  Vital Signs: Blood pressure 104/68, pulse (!) 107, temperature (!) 100.4 F (38 C), temperature source Core (Comment), resp. rate 12, height _0  (1.676 m), weight 126 kg (277 lb 12.5 oz), SpO2 97 %.  Weight trends: Filed Weights   09/27/16 0612 09/27/16 1115 09/28/16 0400  Weight: 122.5 kg (270 lb) 124.4 kg (274 lb 4 oz) 126 kg (277 lb 12.5 oz)    Physical Exam: General: Critically ill appearing  Head: [place]/AT  Eyes: Anicteric, spontaneous EOMs noted  Nose: NG in place  Throat: ETT in place  Neck: Supple, trachea midline.  Lungs:  Bilateral rhonchi, vent assisted  Heart: S1S2, no rubs  Abdomen:  Obese, umbilical hernia noted, BS present  Extremities: trace pretibial edema.  Neurologic: Intubated/sedated  Skin: No visible rashes, scars.    Lab results: Basic Metabolic Panel:  Recent Labs Lab 09/27/16 0603 09/27/16 1109 09/27/16 1716 09/27/16 2145 09/28/16 0301  NA 136 132*  --   --  135  K 5.3* 5.9* 6.8* 5.2* 5.8*  CL 103 105  --   --  106  CO2 16* 16*  --   --  17*  GLUCOSE 123* 161*  --   --  202*  BUN 47* 50*  --   --  57*  CREATININE 4.29* 3.98*  --   --  4.01*  CALCIUM 8.9 7.2*  --   --  6.2*  MG  --  2.2  --   --   --   PHOS  --  3.2  --   --   --     Liver Function Tests:  Recent Labs Lab 09/27/16 0603 09/27/16 1109  AST 48* 52*  ALT 19 22  ALKPHOS 77 67  BILITOT 1.0 0.9  PROT 6.3* 5.7*  ALBUMIN 2.8* 2.6*   No results for input(s): LIPASE, AMYLASE in the last 168 hours. No results for input(s): AMMONIA in the last 168 hours.  CBC:  Recent Labs Lab 09/27/16 0603 09/27/16 1109 09/28/16 0301  WBC 25.9* 16.7* 10.9  NEUTROABS 19.5* 13.6*  --   HGB 11.9* 11.8* 11.9*  HCT 35.8 35.6 35.5  MCV 96.7 93.8 93.0  PLT 353 283 300    Cardiac Enzymes:  Recent Labs Lab 09/27/16 0603  TROPONINI  0.03*    BNP: Invalid input(s): POCBNP  CBG:  Recent Labs Lab 09/27/16 0728 09/27/16 1038 09/27/16 2014 09/28/16 0345  GLUCAP 82 151* 164* 172*    Microbiology: Results for orders placed or performed during the hospital encounter of 09/27/16  Blood Culture (routine x 2)     Status: None (Preliminary result)   Collection Time: 09/27/16  6:02 AM  Result Value Ref Range Status   Specimen Description BLOOD  R [facility]  Final   Special Requests  Final    BOTTLES DRAWN AEROBIC AND ANAEROBIC  AER 3 ML ANA 4 ML   Culture NO GROWTH 1 DAY  Final   Report Status PENDING  Incomplete  Blood Culture (routine x 2)     Status: None (Preliminary result)   Collection Time: 09/27/16  6:03 AM  Result Value Ref Range Status   Specimen Description BLOOD  R ARM  Final   Special Requests   Final    BOTTLES DRAWN AEROBIC AND ANAEROBIC  AER 6 ML ANA 10 ML   Culture NO GROWTH 1 DAY  Final   Report Status PENDING  Incomplete  MRSA PCR Screening     Status: None   Collection Time: 09/27/16 11:09 AM  Result Value Ref Range Status   MRSA by PCR NEGATIVE NEGATIVE Final    Comment:        The GeneXpert MRSA Assay (FDA approved for NASAL specimens only), is one component of a comprehensive MRSA colonization surveillance program. It is not intended to diagnose MRSA infection nor to guide or monitor treatment for MRSA infections.     Coagulation Studies:  Recent Labs  09/27/16 1109  LABPROT 15.8*  INR 1.25    Urinalysis:  Recent Labs  09/27/16 0930  COLORURINE AMBER*  LABSPEC 1.024  PHURINE 5.0  GLUCOSEU 50*  HGBUR NEGATIVE  BILIRUBINUR NEGATIVE  KETONESUR NEGATIVE  PROTEINUR 100*  NITRITE NEGATIVE  LEUKOCYTESUR NEGATIVE      Imaging: Ct Head Wo Contrast  Result Date: 09/27/2016 CLINICAL DATA:  Recent fall with head injury, initial encounter EXAM: CT HEAD WITHOUT CONTRAST TECHNIQUE: Contiguous axial images were obtained from the base of the skull through the vertex without  intravenous contrast. COMPARISON:  None. FINDINGS: Brain: No evidence of acute infarction, hemorrhage, hydrocephalus, extra-axial collection or mass lesion/mass effect. Vascular: No hyperdense vessel or unexpected calcification. Skull: Normal. Negative for fracture or focal lesion. Sinuses/Orbits: No acute finding. Other: Scattered subcutaneous calcified lesions are noted bilaterally these have a benign appearance. IMPRESSION: No acute intracranial abnormality noted. Electronically Signed   By: Inez Catalina M.D.   On: 09/27/2016 08:25   Dg Chest Port 1 View  Result Date: 09/28/2016 CLINICAL DATA:  Shortness of breath, acute onset. Subsequent encounter. EXAM: PORTABLE CHEST 1 VIEW COMPARISON:  Chest radiograph performed 09/27/2016 FINDINGS: The patient's endotracheal tube is seen ending 2-3 cm above the carina. A right IJ line is noted ending about the mid to distal SVC. An enteric tube is noted extending below the diaphragm. The lungs are hypoexpanded. Minimal bilateral atelectasis is noted. No pleural effusion or pneumothorax is seen. The cardiomediastinal silhouette is normal in size. No acute osseous abnormalities are identified. IMPRESSION: 1. Endotracheal tube seen ending 2-3 cm above the carina. 2. Right IJ line noted ending about the mid to distal SVC. 3. Lungs hypoexpanded, with minimal bilateral atelectasis. Electronically Signed   By: Garald Balding M.D.   On: 09/28/2016 05:48   Dg Chest Port 1 View  Result Date: 09/27/2016 CLINICAL DATA:  Central line placement. EXAM: PORTABLE CHEST 1 VIEW COMPARISON:  09/27/2016 at 0705 hours FINDINGS: Endotracheal tube terminates 3 cm above the carina. Right jugular catheter terminates over the mid SVC. Enteric tube courses into the left upper abdomen, more fully evaluated on separate abdominal radiograph. The cardiomediastinal silhouette is within normal limits. Lung volumes are improved with decreased atelectasis in the bases. No pleural effusion or  pneumothorax is identified. IMPRESSION: Support devices as above.  Improved lung aeration. Electronically Signed  By: Logan Bores M.D.   On: 09/27/2016 11:36   Dg Chest Port 1 View  Result Date: 09/27/2016 CLINICAL DATA:  45 year old female status post intubation EXAM: PORTABLE CHEST 1 VIEW COMPARISON:  CT 07/29/2012. Note that this study is reported after the subsequent chest x-ray of the same date. FINDINGS: Endotracheal tube in position measuring approximately 4.2 cm above the carina. Overlying EKG leads. Low lung volumes with linear opacities at the lung bases. No pneumothorax. No large pleural effusion or confluent airspace disease. IMPRESSION: Endotracheal tube terminates suitably above the carina, approximately 4.2 cm. Low lung volumes with basilar atelectasis. Signed, Dulcy Fanny. Earleen Newport, DO [facility] Electronically Signed   By: Corrie Mckusick D.O.   On: 09/27/2016 08:47   Dg Chest Portable 1 View  Result Date: 09/27/2016 CLINICAL DATA:  Central line placement EXAM: PORTABLE CHEST 1 VIEW COMPARISON:  07/27/2012 FINDINGS: Endotracheal tube is 3.4 cm above the carina. Right central line is in place with the tip at the cavoatrial junction. No pneumothorax. Very low lung volumes with bibasilar atelectasis. Heart size is borderline enlarged. No visible effusions. IMPRESSION: Support devices as above, in expected position. Very low lung volumes with bibasilar atelectasis. Electronically Signed   By: Rolm Baptise M.D.   On: 09/27/2016 07:33   Dg Abd Portable 1v  Result Date: 09/27/2016 CLINICAL DATA:  Nasogastric tube placement EXAM: PORTABLE ABDOMEN - 1 VIEW COMPARISON:  Chest from earlier the same day FINDINGS: Nasogastric has been advanced to the body of the decompressed stomach. A few gas distended bowel loops in the visualized upper and mid abdomen. The lower abdomen is excluded. No free air evident on this erect radiograph. IMPRESSION:  1. Nasogastric tube to the stomach. Electronically Signed   By: Lucrezia Europe M.D.   On: 09/27/2016 11:36      Assessment & Plan: Pt is a 45 y.o. female  with a PMHx of Crohn's disease, depression, diabetes mellitus type 2, GERD, hiatal hernia, hyperlipidemia, hypertension, hypothyroidism, obesity, schizophrenia, traumatic brain injury, vitamin D deficiency, who was admitted to [facility] on 09/27/2016 for evaluation of acute respiratory failure.  Pt with suspected septic shock though source not identified initially.  1.  Acute renal failure due to ATN.  2.  Acute respiratory failure. 3.  Hyperkalemia. 4.  Metabolic acidosis.  5.  Septic shock. 6.  Umbilical hernia.   Plan:  The patient is critically ill at this point in time. We suspect that she has acute tubular necrosis from underlying septic shock. Case discussed in depth with the patient's healthcare power of attorney as well as pulmonary critical care. We have decided to proceed with continuous renal replacement therapy at this time. Pulmonary critical care will place the temporary dialysis catheter. We will start the patient on a 2K bath without ultrafiltration at this time. Continue to monitor serum electrolytes. Source of sepsis is not entirely clear at the moment as well. We will proceed with CT scan of the abdomen and pelvis without contrast to further evaluate for sources of sepsis. Continue broad-spectrum anabolic so as being done at the moment. Patient with guarded prognosis at the moment. Thanks for consultation.

## 2016-09-28 NOTE — Progress Notes (Signed)
Initial Nutrition Assessment  DOCUMENTATION CODES:   Morbid obesity  INTERVENTION:  -Discussed nutritional poc during ICU rounds, received order from MD Kasa to start trophic feedings if CT abdomen negative. Per Roselyn Reef RN this afternoon, CT abdomen has been postponed. Will reassess for initiation of TF tomorrow on follow   NUTRITION DIAGNOSIS:   Inadequate oral intake related to acute illness as evidenced by NPO status.  GOAL:   Provide needs based on ASPEN/SCCM guidelines  MONITOR:   Vent status, Labs, Weight trends, TF tolerance  REASON FOR ASSESSMENT:   Ventilator    ASSESSMENT:   45 yo female admitted with septic shock with severe metabolic acidosis, acute encephalopathy and acute respiratory failure  Pt remains critically ill on vent, on 3 pressors, plan to start CRRT today  CT abdomen pending, OG to suction with almost 1 L out  Labs: sodium 132, potassium 5.5, phosphorus 5.5 Meds: reviewed  Diet Order:  Diet NPO time specified  Skin:  Reviewed, no issues  Last BM:  11/28  Height:   Ht Readings from Last 1 Encounters:  09/27/16 5\' 6"  (1.676 m)    Weight:   Wt Readings from Last 1 Encounters:  09/28/16 277 lb 12.5 oz (126 kg)    BMI:  Body mass index is 44.83 kg/m.  Estimated Nutritional Needs:   Kcal:  [id number] kcals   Protein:  118-148 g  Fluid:  >/= 1.7 L  EDUCATION NEEDS:   No education needs identified at this time  [place], [place], [place] [phone] Pager  [phone] Weekend/On-Call Pager

## 2016-09-28 NOTE — Progress Notes (Signed)
Pharmacy Antibiotic / CRRT Dose Adjustment Note  [name] is a 45 y.o. female admitted on 09/27/2016 with sepsis from unknown source with severe metabolic acidosis and acute encephalopathy. Pharmacy has been consulted for vancomycin and piperacillin/tazobactam dosing and to dose adjust medications based on CRRT. Pt is currently on full vent support with multiorgan failure beginning CRRT 11/29.  Plan: Antibiotics: Continue piperacillin/tazobactam 3.375 g IE q 8 hours  Vancomycin dosing interval will be adjusted based on CRRT from q36 hours to vancomycin 1000 mg IV q24 hours. Trough will be drawn prior to third dose (12/1 @ 1400) with a goal trough of 15-25.  CRRT scheduled non antibiotics: Famotidine transitioned from q24 hour dosing to q48 hour dosing  Height: 5\' 6"  (167.6 cm) Weight: 277 lb 12.5 oz (126 kg) IBW/kg (Calculated) : 59.3  Temp (24hrs), Avg:100.3 F (37.9 C), Min:95.7 F (35.4 C), Max:102 F (38.9 C)   Recent Labs Lab 09/27/16 0603 09/27/16 0908 09/27/16 1109 09/28/16 0301  WBC 25.9*  --  16.7* 10.9  CREATININE 4.29*  --  3.98* 4.01*  LATICACIDVEN 6.6* 4.5* 2.1*  --     Estimated Creatinine Clearance: 24.1 mL/min (by C-G formula based on SCr of 4.01 mg/dL (H)).    Allergies  Allergen Reactions  . Peanut Oil Other (See Comments)    Face turns red  . Risperidone And Related Cough    Antimicrobials this admission: Vancomycin 11/28 >>  Piperacillin/tazo 11/28 >>   Dose adjustments this admission:   Microbiology results: 11/28 BCx: NG x 1 day  11/28 UCx: No growth but collected after antibiotic administration 11/28 MRSA PCR: negative  Thank you for allowing pharmacy to be a part of this patient's care.  Darrow Bussing, PharmD Pharmacy Resident 09/28/2016 11:09 AM

## 2016-09-28 NOTE — Procedures (Signed)
Hemodialysis Catheter Insertion Procedure Note GLEN JOW SF:[id number] Apr 24, 1971  Procedure: Insertion of Hemodialysis Catheter Indications: Dialysis Access   Procedure Details Consent: Risks of procedure as well as the alternatives and risks of each were explained to the (patient/caregiver).  Consent for procedure obtained. Time Out: Verified patient identification, verified procedure, site/side was marked, verified correct patient position, special equipment/implants available, medications/allergies/relevent history reviewed, required imaging and test results available.  Performed  Maximum sterile technique was used including antiseptics, cap, gloves, gown, hand hygiene, mask and sheet. Skin prep: Chlorhexidine; local anesthetic administered Triple lumen hemodialysis catheter was inserted into left internal jugular vein using the Seldinger technique.  Evaluation Blood flow good Complications: No apparent complications Patient did tolerate procedure well. Chest X-ray ordered to verify placement.  CXR: pending.  Left internal jugular trialysis catheter placed via ultrasound guidance    Corrin Parker, M.D.  [facility]  Medical Director [place] Director [facility] Cardio-Pulmonary Department

## 2016-09-28 NOTE — Progress Notes (Signed)
Spoke with Intensive care doctor in [place] via [place]. Received new order for 1 liter bolus. Will continue to monitor and assess patient.

## 2016-09-28 NOTE — Progress Notes (Signed)
PULMONARY / CRITICAL CARE MEDICINE   Name: [name] MRN: [id number] DOB: [birth year]    ADMISSION DATE:  09/27/2016 CONSULTATION DATE: 09/27/16  REFERRING MD:  Dr. Loney Hering  CHIEF COMPLAINT: Acute Respiratory Failure  Discussion:  45 yo obese white female admitted to the ICU for septic shock?UTI with severe metabolic acidosis, acute encephalopathy and acute respiratory failure.  Intubated and on mechanical ventilation.  SUBJECTIVE:  Patient remains sedated.  On multiple pressors. CVP remains low, therefore she received several fluid bolus.  Afebrile.  Attempted to place A-line but without success. Multiorgan failure Remains critically ill  VITAL SIGNS: BP (!) 72/61   Pulse (!) 110   Temp 100.2 F (37.9 C)   Resp 14   Ht 5\' 6"  (1.676 m)   Wt 124.4 kg (274 lb 4 oz)   SpO2 97%   BMI 44.27 kg/m   HEMODYNAMICS: CVP:  [1 mmHg-65 mmHg] 3 mmHg  VENTILATOR SETTINGS: Vent Mode: PRVC FiO2 (%):  [35 %-100 %] 35 % Set Rate:  [16 bmp] 16 bmp Vt Set:  [500 mL] 500 mL PEEP:  [5 cmH20] 5 cmH20  INTAKE / OUTPUT: I/O last 3 completed shifts: In: 2987 [I.V.:617; Other:10; NG/GT:60; IV Piggyback:2300] Out: 1200 [Urine:900; Emesis/NG output:300]  PHYSICAL EXAMINATION: General:  Morbidly obese, sickly appearing white female, Intubated and on ventilator Neuro:  Opens eyes to voice, follow simple commands HEENT:  Atraumatic, normocephalic, no discharge, no JVD appreciated Cardiovascular: S1S2, Tachycadic,RRR, No MRG noted Lungs:coarse bilaterally, no wheezes, crackles, rhonchi noted Abdomen:  Firm, distended, active bowel sounds Musculoskeletal:  No inflammation/deformity noted Skin:  Grossly intact GCS<8T  LABS:  BMET  Recent Labs Lab 09/27/16 0603 09/27/16 1109 09/27/16 1716 09/27/16 2145 09/28/16 0301  NA 136 132*  --   --  135  K 5.3* 5.9* 6.8* 5.2* 5.8*  CL 103 105  --   --  106  CO2 16* 16*  --   --  17*  BUN 47* 50*  --   --  57*  CREATININE 4.29*  3.98*  --   --  4.01*  GLUCOSE 123* 161*  --   --  202*    Electrolytes  Recent Labs Lab 09/27/16 0603 09/27/16 1109 09/28/16 0301  CALCIUM 8.9 7.2* 6.2*  MG  --  2.2  --   PHOS  --  3.2  --     CBC  Recent Labs Lab 09/27/16 0603 09/27/16 1109 09/28/16 0301  WBC 25.9* 16.7* 10.9  HGB 11.9* 11.8* 11.9*  HCT 35.8 35.6 35.5  PLT 353 283 300    Coag's  Recent Labs Lab 09/27/16 1109  APTT 40*  INR 1.25    Sepsis Markers  Recent Labs Lab 09/27/16 0603 09/27/16 0908 09/27/16 1109 09/28/16 0301  LATICACIDVEN 6.6* 4.5* 2.1*  --   PROCALCITON  --   --  36.65 54.54    ABG  Recent Labs Lab 09/27/16 0614 09/27/16 1109  PHART 7.18* 7.20*  PCO2ART 32 34  PO2ART 279* 104    Liver Enzymes  Recent Labs Lab 09/27/16 0603 09/27/16 1109  AST 48* 52*  ALT 19 22  ALKPHOS 77 67  BILITOT 1.0 0.9  ALBUMIN 2.8* 2.6*    Cardiac Enzymes  Recent Labs Lab 09/27/16 0603  TROPONINI 0.03*    Glucose  Recent Labs Lab 09/27/16 0728 09/27/16 1038 09/27/16 2014 09/28/16 0345  GLUCAP 82 151* 164* 172*    Imaging Ct Head Wo Contrast  Result Date: 09/27/2016 CLINICAL DATA:  Recent fall with head injury, initial encounter EXAM: CT HEAD WITHOUT CONTRAST TECHNIQUE: Contiguous axial images were obtained from the base of the skull through the vertex without intravenous contrast. COMPARISON:  None. FINDINGS: Brain: No evidence of acute infarction, hemorrhage, hydrocephalus, extra-axial collection or mass lesion/mass effect. Vascular: No hyperdense vessel or unexpected calcification. Skull: Normal. Negative for fracture or focal lesion. Sinuses/Orbits: No acute finding. Other: Scattered subcutaneous calcified lesions are noted bilaterally these have a benign appearance. IMPRESSION: No acute intracranial abnormality noted. Electronically Signed   By: Inez Catalina M.D.   On: 09/27/2016 08:25   Dg Chest Port 1 View  Result Date: 09/27/2016 CLINICAL DATA:  Central  line placement. EXAM: PORTABLE CHEST 1 VIEW COMPARISON:  09/27/2016 at 0705 hours FINDINGS: Endotracheal tube terminates 3 cm above the carina. Right jugular catheter terminates over the mid SVC. Enteric tube courses into the left upper abdomen, more fully evaluated on separate abdominal radiograph. The cardiomediastinal silhouette is within normal limits. Lung volumes are improved with decreased atelectasis in the bases. No pleural effusion or pneumothorax is identified. IMPRESSION: Support devices as above.  Improved lung aeration. Electronically Signed   By: Logan Bores M.D.   On: 09/27/2016 11:36   Dg Chest Port 1 View  Result Date: 09/27/2016 CLINICAL DATA:  45 year old female status post intubation EXAM: PORTABLE CHEST 1 VIEW COMPARISON:  CT 07/29/2012. Note that this study is reported after the subsequent chest x-ray of the same date. FINDINGS: Endotracheal tube in position measuring approximately 4.2 cm above the carina. Overlying EKG leads. Low lung volumes with linear opacities at the lung bases. No pneumothorax. No large pleural effusion or confluent airspace disease. IMPRESSION: Endotracheal tube terminates suitably above the carina, approximately 4.2 cm. Low lung volumes with basilar atelectasis. Signed, Dulcy Fanny. Earleen Newport, DO [facility] Electronically Signed   By: Corrie Mckusick D.O.   On: 09/27/2016 08:47   Dg Chest Portable 1 View  Result Date: 09/27/2016 CLINICAL DATA:  Central line placement EXAM: PORTABLE CHEST 1 VIEW COMPARISON:  07/27/2012 FINDINGS: Endotracheal tube is 3.4 cm above the carina. Right central line is in place with the tip at the cavoatrial junction. No pneumothorax. Very low lung volumes with bibasilar atelectasis. Heart size is borderline enlarged. No visible effusions. IMPRESSION: Support devices as above, in expected position. Very low lung volumes with bibasilar atelectasis. Electronically Signed   By: Rolm Baptise M.D.   On: 09/27/2016 07:33   Dg Abd Portable 1v  Result Date: 09/27/2016 CLINICAL DATA:  Nasogastric tube placement EXAM: PORTABLE ABDOMEN - 1 VIEW COMPARISON:  Chest from earlier the same day FINDINGS: Nasogastric has been advanced to the body of the decompressed stomach. A few gas distended bowel loops in the visualized upper and mid abdomen. The lower abdomen is excluded. No free air evident on this erect radiograph. IMPRESSION: 1. Nasogastric tube to the stomach. Electronically Signed   By: Lucrezia Europe M.D.   On: 09/27/2016 11:36    ASSESSMENT / PLAN: 45 yo obese white female admitted to the ICU for septic shock?UTI with severe metabolic acidosis, acute encephalopathy and acute respiratory failure.  Intubated and on mechanical ventilation.  PULMONARY A: Acute respiratory failure P:   Continue full vent support, wean as tolerated Continue Bronchodilator Continue Steroids Continue fentanyl/versed for sedation  CARDIOVASCULAR A:  Severe septic shock P:  Continuous Telemetry On multiple pressors Will benefit from Arterial line Keep MAP goal>65 CVP remains low F/U ECHO   RENAL  A:   AKI possibly related to severe sepsis Hyperkalemia Hypocalcemia P:   Was given multiple bolus over night Worsening renal function Replace electrolytes per ICU protocol Replaced 1gm of calcium Will consult nephro, if renal function persistently declines Follow chemistry   GASTROINTESTINAL A:   No active issues P:   Consider Tf's Famotidine for GIP  HEMATOLOGIC A:   No active issues P:  Heparin for DVT prophylaxis Transfuse if Hgb<7 Follow cbc  INFECTIOUS A:   Severe septic shock P:   Continue broad spectrum antibiotics Follow cultures Monitor fever curve Follow CBC Procalcitonin-54.54  ENDOCRINE A:   No active issues  P:   Blood Glucose checks with BMP   NEUROLOGIC A:   Acute encephalopathy related to severe sepsis P:   RASS goal: 0 to -1 Minimize  sedation WUA daily   I have personally obtained a history, examined the patient, evaluated Pertinent laboratory and RadioGraphic/imaging results, and  formulated the assessment and plan   The Patient requires high complexity decision making for assessment and support, frequent evaluation and titration of therapies, application of advanced monitoring technologies and extensive interpretation of multiple databases. Critical Care Time devoted to patient care services described in this note is 40 minutes.   Overall, patient is critically ill, prognosis is guarded.  Patient with Multiorgan failure and at high risk for cardiac arrest and death.    Corrin Parker, M.D.  [facility]  Medical Director [place] Director [facility] Cardio-Pulmonary Department

## 2016-09-28 NOTE — Progress Notes (Signed)
Pt to be started on CRRT per order, pt currently sedated on vent, unable to sign consent. Pt HCPOA / [name] (pt aunt) contacted via phone by Dr Holley Raring received consent, this rn was second verification for consent.

## 2016-09-28 NOTE — Progress Notes (Signed)
[facility] Progress Note Patient Name: [name] DOB: September 14, 1971 MRN: [id number]   Date of Service  09/28/2016  HPI/Events of Note  Persistent hypotension despite NE/NEO/Vaso gtts.  CVP range 2 to 5.  Unable to place Aline  eICU Interventions  Plan: 1 liter of NS now for BP support Continue pressors     Intervention Category Major Interventions: Hypotension - evaluation and management  DETERDING,ELIZABETH 09/28/2016, 2:10 AM

## 2016-09-28 NOTE — Progress Notes (Signed)
Inpatient Diabetes Program Recommendations  AACE/ADA: New Consensus Statement on Inpatient Glycemic Control (2015)  Target Ranges:  Prepandial:   less than 140 mg/dL      Peak postprandial:   less than 180 mg/dL (1-2 hours)      Critically ill patients:  140 - 180 mg/dL   Results for TILA, KIJEK (MRN [id number]) as of 09/28/2016 09:51  Ref. Range 09/27/2016 10:38 09/27/2016 20:14 09/28/2016 03:45  Glucose-Capillary Latest Ref Range: 65 - 99 mg/dL 151 (H) 164 (H) 172 (H)   Review of Glycemic Control  Diabetes history: DM2 Outpatient Diabetes medications: Metformin 500 mg daily Current orders for Inpatient glycemic control: None  Inpatient Diabetes Program Recommendations: Correction (SSI): Please order ICU Glycemic Control order set (Phase 1).  Thanks, Barnie Alderman, RN, MSN, CDE Diabetes Coordinator Inpatient Diabetes Program [phone] (Team Pager from 8am to 5pm)

## 2016-09-28 NOTE — Clinical Social Work Note (Signed)
Clinical Social Work Assessment  Patient Details  Name: [name] MRN: [id number] Date of Birth: 02/26/1971  Date of referral:  09/28/16               Reason for consult:  Facility Placement                Permission sought to share information with:   (unable due to being on vent) Permission granted to share information::     Name::        Agency::     Relationship::     Contact Information:     Housing/Transportation Living arrangements for the past 2 months:  Group Home Source of Information:  Facility Patient Interpreter Needed:  None Criminal Activity/Legal Involvement Pertinent to Current Situation/Hospitalization:  No - Comment as needed Significant Relationships:  Other(Comment) (aunt) Lives with:  Facility Resident Do you feel safe going back to the place where you live?   (patient unable to answer as she is on vent) Need for family participation in patient care:  Yes (Comment)  Care giving concerns:  Patient resides at [facility]   Social Worker assessment / plan:  CSW informed that patient is a resident at [facility]. CSW contacted the owner of the group home: [name]: [phone] and she informed CSW that patient has been with her for 3 years. Patient's baseline is independent in ADL's and she goes to [facility] classes and communicates well and is alert and oriented X4. Ms. Primus Bravo stated that she is very worried about the patient and is hoping for a full recovery. Ms. Primus Bravo stated that patient's only contact and responsible party would be her aunt: [name]. Ms. Primus Bravo did not have her phone number handy but stated that she gave it to someone in ICU this morning. She states that [name] is on her way here from [place] and has informed ICU staff that she wants aggressive care. It is uncertain what level of care patient will require at this time as patient remains on the vent.  Employment status:  Disabled (Comment on whether or not currently  receiving Disability) Insurance information:  Medicare PT Recommendations:    Information / Referral to community resources:     Patient/Family's Response to care:  Patient on vent, only family member on way here from [place] and Ms. Primus Bravo actively concerned and involved.  Patient/Family's Understanding of and Emotional Response to Diagnosis, Current Treatment, and Prognosis:  Patient on vent, only family member on way here from [place].   Emotional Assessment Appearance:  Appears stated age Attitude/Demeanor/Rapport:   (on vent currently) Affect (typically observed):   (on vent currently) Orientation:   (on vent currently; basline is alert and oriented X4) Alcohol / Substance use:  Not Applicable Psych involvement (Current and /or in the community):  No (Comment)  Discharge Needs  Concerns to be addressed:  Care Coordination Readmission within the last 30 days:  No Current discharge risk:  None Barriers to Discharge:  No Barriers Identified   Shela Leff, LCSW 09/28/2016, 9:52 AM

## 2016-09-28 NOTE — Progress Notes (Signed)
Verbally notified NP of the critical value: calcium 6.2

## 2016-09-29 ENCOUNTER — Inpatient Hospital Stay
Admit: 2016-09-29 | Discharge: 2016-09-29 | Disposition: A | Discharge status: Home / Self Care | Payer: Medicare Other | Attending: Pulmonary Disease | Admitting: Pulmonary Disease

## 2016-09-29 DIAGNOSIS — N179 Acute kidney failure, unspecified: Secondary | ICD-10-CM

## 2016-09-29 LAB — BASIC METABOLIC PANEL
Anion gap: 9 (ref 5–15)
BUN: 40 mg/dL — AB (ref 6–20)
CHLORIDE: 102 mmol/L (ref 101–111)
CO2: 23 mmol/L (ref 22–32)
Calcium: 7.1 mg/dL — ABNORMAL LOW (ref 8.9–10.3)
Creatinine, Ser: 2.91 mg/dL — ABNORMAL HIGH (ref 0.44–1.00)
GFR calc Af Amer: 21 mL/min — ABNORMAL LOW (ref >60)
GFR calc non Af Amer: 18 mL/min — ABNORMAL LOW (ref >60)
Glucose, Bld: 146 mg/dL — ABNORMAL HIGH (ref 65–99)
POTASSIUM: 4.1 mmol/L (ref 3.5–5.1)
SODIUM: 134 mmol/L — AB (ref 135–145)

## 2016-09-29 LAB — CBC WITH DIFFERENTIAL/PLATELET
Basophils Absolute: 0 10*3/uL (ref 0–0.1)
Basophils Relative: 0 %
EOS ABS: 0 10*3/uL (ref 0–0.7)
Eosinophils Relative: 0 %
HEMATOCRIT: 32.7 % — AB (ref 35.0–47.0)
HEMOGLOBIN: 11.1 g/dL — AB (ref 12.0–16.0)
LYMPHS ABS: 1.5 10*3/uL (ref 1.0–3.6)
LYMPHS PCT: 12 %
MCH: 31.1 pg (ref 26.0–34.0)
MCHC: 33.9 g/dL (ref 32.0–36.0)
MCV: 91.8 fL (ref 80.0–100.0)
Monocytes Absolute: 0.3 10*3/uL (ref 0.2–0.9)
Monocytes Relative: 2 %
NEUTROS ABS: 11.1 10*3/uL — AB (ref 1.4–6.5)
NEUTROS PCT: 86 %
Platelets: 224 10*3/uL (ref 150–440)
RBC: 3.57 MIL/uL — AB (ref 3.80–5.20)
RDW: 15.1 % — ABNORMAL HIGH (ref 11.5–14.5)
WBC: 13 10*3/uL — AB (ref 3.6–11.0)

## 2016-09-29 LAB — RENAL FUNCTION PANEL
ALBUMIN: 2.1 g/dL — AB (ref 3.5–5.0)
ALBUMIN: 1.9 g/dL — AB (ref 3.5–5.0)
ALBUMIN: 1.9 g/dL — AB (ref 3.5–5.0)
ALBUMIN: 1.9 g/dL — AB (ref 3.5–5.0)
ANION GAP: 10 (ref 5–15)
ANION GAP: 9 (ref 5–15)
Albumin: 1.9 g/dL — ABNORMAL LOW (ref 3.5–5.0)
Albumin: 1.9 g/dL — ABNORMAL LOW (ref 3.5–5.0)
Anion gap: 10 (ref 5–15)
Anion gap: 8 (ref 5–15)
Anion gap: 6 (ref 5–15)
Anion gap: 8 (ref 5–15)
BUN: 45 mg/dL — ABNORMAL HIGH (ref 6–20)
BUN: 39 mg/dL — AB (ref 6–20)
BUN: 34 mg/dL — ABNORMAL HIGH (ref 6–20)
BUN: 29 mg/dL — AB (ref 6–20)
BUN: 26 mg/dL — AB (ref 6–20)
BUN: 25 mg/dL — ABNORMAL HIGH (ref 6–20)
CALCIUM: 7.1 mg/dL — AB (ref 8.9–10.3)
CALCIUM: 7.1 mg/dL — AB (ref 8.9–10.3)
CALCIUM: 7.2 mg/dL — AB (ref 8.9–10.3)
CALCIUM: 7.4 mg/dL — AB (ref 8.9–10.3)
CALCIUM: 7.5 mg/dL — AB (ref 8.9–10.3)
CO2: 21 mmol/L — AB (ref 22–32)
CO2: 22 mmol/L (ref 22–32)
CO2: 23 mmol/L (ref 22–32)
CO2: 24 mmol/L (ref 22–32)
CO2: 26 mmol/L (ref 22–32)
CO2: 25 mmol/L (ref 22–32)
CREATININE: 2.15 mg/dL — AB (ref 0.44–1.00)
CREATININE: 2.07 mg/dL — AB (ref 0.44–1.00)
CREATININE: 2.01 mg/dL — AB (ref 0.44–1.00)
Calcium: 7.5 mg/dL — ABNORMAL LOW (ref 8.9–10.3)
Chloride: 102 mmol/L (ref 101–111)
Chloride: 101 mmol/L (ref 101–111)
Chloride: 101 mmol/L (ref 101–111)
Chloride: 101 mmol/L (ref 101–111)
Chloride: 101 mmol/L (ref 101–111)
Chloride: 102 mmol/L (ref 101–111)
Creatinine, Ser: 3.41 mg/dL — ABNORMAL HIGH (ref 0.44–1.00)
Creatinine, Ser: 2.94 mg/dL — ABNORMAL HIGH (ref 0.44–1.00)
Creatinine, Ser: 2.49 mg/dL — ABNORMAL HIGH (ref 0.44–1.00)
GFR calc Af Amer: 21 mL/min — ABNORMAL LOW (ref >60)
GFR calc Af Amer: 31 mL/min — ABNORMAL LOW (ref >60)
GFR calc Af Amer: 32 mL/min — ABNORMAL LOW (ref >60)
GFR calc Af Amer: 33 mL/min — ABNORMAL LOW (ref >60)
GFR calc non Af Amer: 15 mL/min — ABNORMAL LOW (ref >60)
GFR calc non Af Amer: 22 mL/min — ABNORMAL LOW (ref >60)
GFR calc non Af Amer: 28 mL/min — ABNORMAL LOW (ref >60)
GFR calc non Af Amer: 29 mL/min — ABNORMAL LOW (ref >60)
GFR, EST AFRICAN AMERICAN: 18 mL/min — AB (ref >60)
GFR, EST AFRICAN AMERICAN: 26 mL/min — AB (ref >60)
GFR, EST NON AFRICAN AMERICAN: 18 mL/min — AB (ref >60)
GFR, EST NON AFRICAN AMERICAN: 27 mL/min — AB (ref >60)
GLUCOSE: 142 mg/dL — AB (ref 65–99)
GLUCOSE: 111 mg/dL — AB (ref 65–99)
GLUCOSE: 125 mg/dL — AB (ref 65–99)
Glucose, Bld: 151 mg/dL — ABNORMAL HIGH (ref 65–99)
Glucose, Bld: 139 mg/dL — ABNORMAL HIGH (ref 65–99)
Glucose, Bld: 142 mg/dL — ABNORMAL HIGH (ref 65–99)
PHOSPHORUS: 4.9 mg/dL — AB (ref 2.5–4.6)
PHOSPHORUS: 4.7 mg/dL — AB (ref 2.5–4.6)
PHOSPHORUS: 4.1 mg/dL (ref 2.5–4.6)
PHOSPHORUS: 3.8 mg/dL (ref 2.5–4.6)
POTASSIUM: 4.2 mmol/L (ref 3.5–5.1)
Phosphorus: 3.5 mg/dL (ref 2.5–4.6)
Phosphorus: 3.4 mg/dL (ref 2.5–4.6)
Potassium: 4.1 mmol/L (ref 3.5–5.1)
Potassium: 4 mmol/L (ref 3.5–5.1)
Potassium: 3.7 mmol/L (ref 3.5–5.1)
Potassium: 3.7 mmol/L (ref 3.5–5.1)
Potassium: 3.7 mmol/L (ref 3.5–5.1)
SODIUM: 133 mmol/L — AB (ref 135–145)
SODIUM: 133 mmol/L — AB (ref 135–145)
SODIUM: 133 mmol/L — AB (ref 135–145)
SODIUM: 133 mmol/L — AB (ref 135–145)
SODIUM: 135 mmol/L (ref 135–145)
Sodium: 133 mmol/L — ABNORMAL LOW (ref 135–145)

## 2016-09-29 LAB — GLUCOSE, CAPILLARY
GLUCOSE-CAPILLARY: 119 mg/dL — AB (ref 65–99)
Glucose-Capillary: 111 mg/dL — ABNORMAL HIGH (ref 65–99)
Glucose-Capillary: 118 mg/dL — ABNORMAL HIGH (ref 65–99)
Glucose-Capillary: 121 mg/dL — ABNORMAL HIGH (ref 65–99)
Glucose-Capillary: 126 mg/dL — ABNORMAL HIGH (ref 65–99)
Glucose-Capillary: 139 mg/dL — ABNORMAL HIGH (ref 65–99)

## 2016-09-29 LAB — ECHOCARDIOGRAM COMPLETE
Height: 66 in
WEIGHTICAEL: 4532.66 [oz_av]

## 2016-09-29 LAB — PROCALCITONIN: Procalcitonin: 26.25 ng/mL

## 2016-09-29 LAB — PHOSPHORUS: Phosphorus: 3.6 mg/dL (ref 2.5–4.6)

## 2016-09-29 MED ORDER — LORAZEPAM 2 MG/ML IJ SOLN
INTRAMUSCULAR | Status: AC
  Administered 2016-09-29: 4 mg
  Filled 2016-09-29: qty 2

## 2016-09-29 MED ORDER — MIDAZOLAM HCL 2 MG/2ML IJ SOLN
4.0000 mg | INTRAMUSCULAR | Status: DC | PRN
  Administered 2016-10-02: 4 mg via INTRAVENOUS
  Filled 2016-09-29: qty 4

## 2016-09-29 MED ORDER — SODIUM CHLORIDE 0.9% FLUSH
10.0000 mL | Freq: Two times a day (BID) | INTRAVENOUS | Status: DC
  Administered 2016-09-30 – 2016-10-10 (×16): 10 mL
  Administered 2016-10-11: 23 mL
  Administered 2016-10-12 – 2016-10-18 (×9): 10 mL

## 2016-09-29 MED ORDER — SODIUM CHLORIDE 0.9% FLUSH
10.0000 mL | INTRAVENOUS | Status: DC | PRN
  Administered 2016-09-30 (×2): 10 mL
  Filled 2016-09-29 (×2): qty 40

## 2016-09-29 MED ORDER — LORAZEPAM 0.5 MG PO TABS: 1.0000 mg | ORAL_TABLET | Freq: Every morning | ORAL | Status: DC

## 2016-09-29 MED ORDER — VITAL HIGH PROTEIN PO LIQD
1000.0000 mL | ORAL | Status: DC
  Administered 2016-09-29: 1000 mL

## 2016-09-29 MED ORDER — PUREFLOW DIALYSIS SOLUTION
INTRAVENOUS | Status: DC
  Administered 2016-09-29: 3 via INTRAVENOUS_CENTRAL
  Administered 2016-09-30 (×2): via INTRAVENOUS_CENTRAL
  Administered 2016-09-30 – 2016-10-01 (×3): 3 via INTRAVENOUS_CENTRAL

## 2016-09-29 NOTE — Progress Notes (Signed)
[facility]  ROUNDING NOTE   Subjective:  Patient remains critically ill at this point in time.  She remains on the ventilator at this time. She has tolerated continuous renal placement therapy well. Patient was a bit combative early this a.m.  Objective:  Vital signs in last 24 hours:  Temp:  [96.6 F (35.9 C)-100.4 F (38 C)] 97.2 F (36.2 C) (11/30 1000) Pulse Rate:  [91-103] 91 (11/30 0700) Resp:  [14-20] 18 (11/30 1000) BP: (68-124)/(40-77) 97/55 (11/30 1000) SpO2:  [95 %-100 %] 99 % (11/30 0956) FiO2 (%):  [35 %-100 %] 40 % (11/30 0956) Weight:  [128.5 kg (283 lb 4.7 oz)] 128.5 kg (283 lb 4.7 oz) (11/30 0121)  Weight change: 4.1 kg (9 lb 0.6 oz) Filed Weights   09/27/16 1115 09/28/16 0400 09/29/16 0121  Weight: 124.4 kg (274 lb 4 oz) 126 kg (277 lb 12.5 oz) 128.5 kg (283 lb 4.7 oz)    Intake/Output: I/O last 3 completed shifts: In: 8527.5 [I.V.:6777.5; IV Piggyback:1750] Out: 1955 [Urine:1250; Emesis/NG output:685; Stool:20]   Intake/Output this shift:  Total I/O In: 168.8 [I.V.:118.8; IV Piggyback:50] Out: 0   Physical Exam: General: Critically ill appearing  Head: Normocephalic, atraumatic. ETT/NG in palce  Eyes: Anicteric  Neck: Supple, trachea midline  Lungs:  Scattered rhonchi, normal effort   Heart: S1S2 Tachycardic   Abdomen:  Umbilical hernia noted, BS present   Extremities: trace peripheral edema.  Neurologic: Awake, a bit combative in bed  Skin: No lesions  Access: L IJ temporary dialysis catheter placed 82/99/37    Basic Metabolic Panel:  Recent Labs Lab 09/27/16 1109  09/28/16 1448 09/28/16 2130 09/29/16 0112 09/29/16 0508 09/29/16 0934  NA 132*  < > 133* 132* 133* 133*  134* 133*  K 5.9*  < > 5.3* 4.7 4.2 4.1  4.1 4.0  CL 105  < > 105 102 102 101  102 101  CO2 16*  < > 16* 19* 21* 22  23 23   GLUCOSE 161*  < > 145* 174* 151* 142*  146* 139*  BUN 50*  < > 62* 52* 45* 39*  40* 34*  CREATININE 3.98*  < > 4.60*  3.84* 3.41* 2.94*  2.91* 2.49*  CALCIUM 7.2*  < > 6.0* 6.8* 7.1* 7.1*  7.1* 7.2*  MG 2.2  --   --   --   --   --   --   PHOS 3.2  < > 5.7* 5.1* 4.9* 4.7* 4.1  < > = values in this interval not displayed.  Liver Function Tests:  Recent Labs Lab 09/27/16 0603 09/27/16 1109  09/28/16 1448 09/28/16 2130 09/29/16 0112 09/29/16 0508 09/29/16 0934  AST 48* 52*  --   --   --   --   --   --   ALT 19 22  --   --   --   --   --   --   ALKPHOS 77 67  --   --   --   --   --   --   BILITOT 1.0 0.9  --   --   --   --   --   --   PROT 6.3* 5.7*  --   --   --   --   --   --   ALBUMIN 2.8* 2.6*  < > 2.0* 2.1* 2.1* 1.9* 1.9*  < > = values in this interval not displayed. No results for input(s): LIPASE, AMYLASE in the  last 168 hours. No results for input(s): AMMONIA in the last 168 hours.  CBC:  Recent Labs Lab 09/27/16 0603 09/27/16 1109 09/28/16 0301 09/29/16 0508  WBC 25.9* 16.7* 10.9 13.0*  NEUTROABS 19.5* 13.6*  --  11.1*  HGB 11.9* 11.8* 11.9* 11.1*  HCT 35.8 35.6 35.5 32.7*  MCV 96.7 93.8 93.0 91.8  PLT 353 283 300 224    Cardiac Enzymes:  Recent Labs Lab 09/27/16 0603  TROPONINI 0.03*    BNP: Invalid input(s): POCBNP  CBG:  Recent Labs Lab 09/28/16 1730 09/28/16 1947 09/28/16 2350 09/29/16 0350 09/29/16 0725  GLUCAP 128* 138* 127* 139* 126*    Microbiology: Results for orders placed or performed during the hospital encounter of 09/27/16  Blood Culture (routine x 2)     Status: None (Preliminary result)   Collection Time: 09/27/16  6:02 AM  Result Value Ref Range Status   Specimen Description BLOOD  R AC  Final   Special Requests   Final    BOTTLES DRAWN AEROBIC AND ANAEROBIC  AER 3 ML ANA 4 ML   Culture NO GROWTH 2 DAYS  Final   Report Status PENDING  Incomplete  Blood Culture (routine x 2)     Status: None (Preliminary result)   Collection Time: 09/27/16  6:03 AM  Result Value Ref Range Status   Specimen Description BLOOD  R ARM  Final   Special  Requests   Final    BOTTLES DRAWN AEROBIC AND ANAEROBIC  AER 6 ML ANA 10 ML   Culture NO GROWTH 2 DAYS  Final   Report Status PENDING  Incomplete  Urine culture     Status: None   Collection Time: 09/27/16  9:30 AM  Result Value Ref Range Status   Specimen Description URINE, RANDOM  Final   Special Requests NONE  Final   Culture NO GROWTH Performed at [facility]   Final   Report Status 09/28/2016 FINAL  Final  MRSA PCR Screening     Status: None   Collection Time: 09/27/16 11:09 AM  Result Value Ref Range Status   MRSA by PCR NEGATIVE NEGATIVE Final    Comment:        The GeneXpert MRSA Assay (FDA approved for NASAL specimens only), is one component of a comprehensive MRSA colonization surveillance program. It is not intended to diagnose MRSA infection nor to guide or monitor treatment for MRSA infections.     Coagulation Studies:  Recent Labs  09/27/16 1109  LABPROT 15.8*  INR 1.25    Urinalysis:  Recent Labs  09/27/16 0930  COLORURINE AMBER*  LABSPEC 1.024  PHURINE 5.0  GLUCOSEU 50*  HGBUR NEGATIVE  BILIRUBINUR NEGATIVE  KETONESUR NEGATIVE  PROTEINUR 100*  NITRITE NEGATIVE  LEUKOCYTESUR NEGATIVE      Imaging: Dg Chest Port 1 View  Result Date: 09/28/2016 CLINICAL DATA:  Central line placement EXAM: PORTABLE CHEST 1 VIEW COMPARISON:  09/28/2016 FINDINGS: Cardiomediastinal silhouette is stable. Stable endotracheal and NG tube position. Stable right IJ central line. There is new left IJ central line with tip in SVC right atrium junction. No pneumothorax. IMPRESSION: New left IJ central line with tip in SVC right atrium junction. No pneumothorax. Electronically Signed   By: Lahoma Crocker M.D.   On: 09/28/2016 14:36   Dg Chest Port 1 View  Result Date: 09/28/2016 CLINICAL DATA:  Shortness of breath, acute onset. Subsequent encounter. EXAM: PORTABLE CHEST 1 VIEW COMPARISON:  Chest radiograph performed 09/27/2016 FINDINGS: The patient's endotracheal  tube is seen ending 2-3 cm above the carina. A right IJ line is noted ending about the mid to distal SVC. An enteric tube is noted extending below the diaphragm. The lungs are hypoexpanded. Minimal bilateral atelectasis is noted. No pleural effusion or pneumothorax is seen. The cardiomediastinal silhouette is normal in size. No acute osseous abnormalities are identified. IMPRESSION: 1. Endotracheal tube seen ending 2-3 cm above the carina. 2. Right IJ line noted ending about the mid to distal SVC. 3. Lungs hypoexpanded, with minimal bilateral atelectasis. Electronically Signed   By: Garald Balding M.D.   On: 09/28/2016 05:48   Dg Chest Port 1 View  Result Date: 09/27/2016 CLINICAL DATA:  Central line placement. EXAM: PORTABLE CHEST 1 VIEW COMPARISON:  09/27/2016 at 0705 hours FINDINGS: Endotracheal tube terminates 3 cm above the carina. Right jugular catheter terminates over the mid SVC. Enteric tube courses into the left upper abdomen, more fully evaluated on separate abdominal radiograph. The cardiomediastinal silhouette is within normal limits. Lung volumes are improved with decreased atelectasis in the bases. No pleural effusion or pneumothorax is identified. IMPRESSION: Support devices as above.  Improved lung aeration. Electronically Signed   By: Logan Bores M.D.   On: 09/27/2016 11:36   Dg Abd Portable 1v  Result Date: 09/27/2016 CLINICAL DATA:  Nasogastric tube placement EXAM: PORTABLE ABDOMEN - 1 VIEW COMPARISON:  Chest from earlier the same day FINDINGS: Nasogastric has been advanced to the body of the decompressed stomach. A few gas distended bowel loops in the visualized upper and mid abdomen. The lower abdomen is excluded. No free air evident on this erect radiograph. IMPRESSION: 1. Nasogastric tube to the stomach. Electronically Signed   By: Lucrezia Europe M.D.   On: 09/27/2016 11:36     Medications:   . sodium chloride 50 mL/hr at 09/29/16 0700  . fentaNYL infusion INTRAVENOUS 175 mcg/hr  (09/29/16 0721)  . norepinephrine (LEVOPHED) Adult infusion 55 mcg/min (09/29/16 0700)  . phenylephrine (NEO-SYNEPHRINE) Adult infusion 150 mcg/min (09/29/16 0845)  . pureflow 2,500 mL/hr at 09/29/16 0421  . vasopressin (PITRESSIN) infusion - *FOR SHOCK* 0.04 Units/min (09/29/16 0700)   . chlorhexidine gluconate (MEDLINE KIT)  15 mL Mouth Rinse BID  . famotidine (PEPCID) IV  20 mg Intravenous Q48H  . fentaNYL (SUBLIMAZE) injection  50 mcg Intravenous Once  . heparin  5,000 Units Subcutaneous Q8H  . hydrocortisone sod succinate (SOLU-CORTEF) inj  50 mg Intravenous Q6H  . insulin aspart  2-6 Units Subcutaneous Q4H  . mouth rinse  15 mL Mouth Rinse QID  . midazolam  1 mg Intravenous Once  . piperacillin-tazobactam (ZOSYN)  IV  3.375 g Intravenous Q8H  . [START ON 09/30/2016] pneumococcal 23 valent vaccine  0.5 mL Intramuscular Tomorrow-1000  . sodium chloride flush  3 mL Intravenous Q12H  . vancomycin  1,000 mg Intravenous Q24H   sodium chloride, sodium chloride, acetaminophen, fentaNYL, heparin, ondansetron (ZOFRAN) IV, sodium chloride flush  Assessment/ Plan:  44 y.o. female with a PMHx of Crohn's disease, depression, diabetes mellitus type 2, GERD, hiatal hernia, hyperlipidemia, hypertension, hypothyroidism, obesity, schizophrenia, traumatic brain injury, vitamin D deficiency, who was admitted to [facility] on 09/27/2016 for evaluation of acute respiratory failure.  Pt with suspected septic shock though source not identified initially.  1.  Acute renal failure due to ATN.  2.  Acute respiratory failure. 3.  Hyperkalemia. 4.  Metabolic acidosis.  5.  Septic shock. 6.  Umbilical hernia.   Plan:  At this point in time the  patient remains critically ill. We will maintain the patient on continuous renal replacement therapy. Case discussed with ICU nurse. Unfortunately we cannot add ultrafiltration at this time as she was a bit hypotensive.  The patient's electrolytes have improved. Potassium  down to 4.0. Serum bicarbonate currently 23. No new ABG from this a.m. Continue pressors to maintain a map of 65 or greater. Overall continues to have guarded prognosis.   LOS: 2 Francile Woolford 11/30/201710:36 AM

## 2016-09-29 NOTE — Progress Notes (Signed)
Chaplain rounded the unit to provide a compassionate presence and support to the patient. Patient appeared to be sleeping.  Silent prayer was said on behalf of the patient. Minerva Fester [phone]

## 2016-09-29 NOTE — Progress Notes (Signed)
*  PRELIMINARY RESULTS* Echocardiogram 2D Echocardiogram has been performed.  [name] 09/29/2016, 9:16 AM

## 2016-09-29 NOTE — Progress Notes (Signed)
Asked to place an arterial line by ICU staff Came up to place line and the ICU did not have any radial arterial line kits to use Radial line would be preferred to avoid infectious complications Asked them to obtain radial arterial line kits and I will return tomorrow to place line, or critical care team can attempt again if they desire.

## 2016-09-29 NOTE — Progress Notes (Signed)
Pharmacy Antibiotic / CRRT Dose Adjustment Note  [name] is a 45 y.o. female admitted on 09/27/2016 with sepsis from unknown source with severe metabolic acidosis and acute encephalopathy. Pharmacy has been consulted for vancomycin and piperacillin/tazobactam dosing and to dose adjust medications based on CRRT. Pt is currently on full vent support with multiorgan failure beginning CRRT 11/29.  Plan: Antibiotics: Continue piperacillin/tazobactam 3.375 g IE q 8 hours  Vancomycin dosing interval will be adjusted based on CRRT from q36 hours to vancomycin 1000 mg IV q24 hours. Trough will be drawn prior to third dose (12/1 @ 1400) with a goal trough of 15-25. Will discuss questionable need for vancomycin if suspected source of infection is UTI?  CRRT scheduled non antibiotics: No medications require adjustment at present. Will continue to monitor.   Height: 5\' 6"  (167.6 cm) Weight: 283 lb 4.7 oz (128.5 kg) IBW/kg (Calculated) : 59.3  Temp (24hrs), Avg:97.9 F (36.6 C), Min:96.6 F (35.9 C), Max:100.4 F (38 C)   Recent Labs Lab 09/27/16 0603 09/27/16 0908 09/27/16 1109 09/28/16 0301  09/28/16 1448 09/28/16 2130 09/29/16 0112 09/29/16 0508 09/29/16 0934  WBC 25.9*  --  16.7* 10.9  --   --   --   --  13.0*  --   CREATININE 4.29*  --  3.98* 4.01*  < > 4.60* 3.84* 3.41* 2.94*  2.91* 2.49*  LATICACIDVEN 6.6* 4.5* 2.1*  --   --   --   --   --   --   --   < > = values in this interval not displayed.  Estimated Creatinine Clearance: 39.2 mL/min (by C-G formula based on SCr of 2.49 mg/dL (H)).    Allergies  Allergen Reactions  . Peanut Oil Other (See Comments)    Face turns red  . Risperidone And Related Cough    Antimicrobials this admission: Vancomycin 11/28 >>  Piperacillin/tazo 11/28 >>   Dose adjustments this admission:   Microbiology results: 11/28 BCx: NG  11/28 UCx: No growth but collected after antibiotic administration 11/28 MRSA PCR: negative  Thank  you for allowing pharmacy to be a part of this patient's care.  Ulice Dash, PharmD Clinical Pharmacist  09/29/2016 11:59 AM

## 2016-09-29 NOTE — Progress Notes (Signed)
Nutrition Follow-up  DOCUMENTATION CODES:   Morbid obesity  INTERVENTION:  -Discussed nutritional poc during ICU rounds and received verbal order from MD Kasa to initiate TF -Recommend starting Vital High Protein at rate of 20 ml/hr with goal of 65 ml/hr providing 137 g protein, 1560 kcals, 1310 mL of free water. Continue to assess  NUTRITION DIAGNOSIS:   Inadequate oral intake related to acute illness as evidenced by NPO status.  Being addressed as starting TF today  GOAL:   Provide needs based on ASPEN/SCCM guidelines  MONITOR:   Vent status, Labs, Weight trends, TF tolerance  REASON FOR ASSESSMENT:   Ventilator    ASSESSMENT:   45 yo female admitted with septic shock with severe metabolic acidosis, acute encephalopathy and acute respiratory failure  Pt remains sedated on vent, multiorgan failure, on multiple pressors, on CRRT. OG tube output decreased, CT abdomen not performed  Labs: sodium 133, phosphorus 4.7, albumin 1.9 Meds: NS at 50 ml/hr, fentanyl, levophed, vasopressin, phenylephrine  Diet Order:  Diet NPO time specified  Skin:  Reviewed, no issues  Last BM:  11/28  Height:   Ht Readings from Last 1 Encounters:  09/27/16 5\' 6"  (1.676 m)    Weight:   Wt Readings from Last 1 Encounters:  09/29/16 283 lb 4.7 oz (128.5 kg)    Filed Weights   09/27/16 1115 09/28/16 0400 09/29/16 0121  Weight: 274 lb 4 oz (124.4 kg) 277 lb 12.5 oz (126 kg) 283 lb 4.7 oz (128.5 kg)    BMI:  Body mass index is 45.72 kg/m.  Estimated Nutritional Needs:   Kcal:  [id number] kcals   Protein:  118-148 g  Fluid:  >/= 1.7 L  EDUCATION NEEDS:   No education needs identified at this time  [place], [place], [place] [phone] Pager  [phone] Weekend/On-Call Pager

## 2016-09-29 NOTE — Progress Notes (Signed)
PULMONARY / CRITICAL CARE MEDICINE   Name: [name] MRN: [id number] DOB: 10/25/1971    ADMISSION DATE:  09/27/2016 CONSULTATION DATE: 09/27/16  REFERRING MD:  Dr. Loney Hering  CHIEF COMPLAINT: Acute Respiratory Failure  Discussion:  45 yo obese white female admitted to the ICU for septic shock?UTI with severe metabolic acidosis, acute encephalopathy and acute respiratory failure.  Intubated and on mechanical ventilation.  SUBJECTIVE:  Patient remains sedated.  On multiple pressors. CVP remains low, therefore she received several fluid bolus.  Afebrile.  Attempted to place A-line but without success. Multiorgan failure Remains critically ill  VITAL SIGNS: BP 107/64   Pulse 97   Temp 97.3 F (36.3 C)   Resp 16   Ht 5\' 6"  (1.676 m)   Wt 283 lb 4.7 oz (128.5 kg)   SpO2 100%   BMI 45.72 kg/m   HEMODYNAMICS: CVP:  [5 mmHg-22 mmHg] 15 mmHg  VENTILATOR SETTINGS: Vent Mode: PRVC FiO2 (%):  [35 %-100 %] 60 % Set Rate:  [16 bmp] 16 bmp Vt Set:  [500 mL] 500 mL PEEP:  [5 cmH20-12 cmH20] 12 cmH20 Plateau Pressure:  [15 cmH20] 15 cmH20  INTAKE / OUTPUT: I/O last 3 completed shifts: In: 8103.5 [I.V.:6353.5; IV Piggyback:1750] Out: [id number] [Urine:950; Emesis/NG output:685; Stool:20]  PHYSICAL EXAMINATION: General:  Morbidly obese, sickly appearing white female, Intubated and on ventilator Neuro:  Opens eyes to voice, follow simple commands HEENT:  Atraumatic, normocephalic, no discharge, no JVD appreciated Cardiovascular: S1S2, Tachycadic,RRR, No MRG noted Lungs:coarse bilaterally, no wheezes, crackles, rhonchi noted Abdomen:  Firm, distended, active bowel sounds Musculoskeletal:  No inflammation/deformity noted Skin:  Grossly intact GCS<8T  LABS:  BMET  Recent Labs Lab 09/28/16 2130 09/29/16 0112 09/29/16 0508  NA 132* 133* 133*  134*  K 4.7 4.2 4.1  4.1  CL 102 102 101  102  CO2 19* 21* 22  23  BUN 52* 45* 39*  40*  CREATININE 3.84* 3.41* 2.94*   2.91*  GLUCOSE 174* 151* 142*  146*    Electrolytes  Recent Labs Lab 09/27/16 1109  09/28/16 2130 09/29/16 0112 09/29/16 0508  CALCIUM 7.2*  < > 6.8* 7.1* 7.1*  7.1*  MG 2.2  --   --   --   --   PHOS 3.2  < > 5.1* 4.9* 4.7*  < > = values in this interval not displayed.  CBC  Recent Labs Lab 09/27/16 1109 09/28/16 0301 09/29/16 0508  WBC 16.7* 10.9 13.0*  HGB 11.8* 11.9* 11.1*  HCT 35.6 35.5 32.7*  PLT 283 300 224    Coag's  Recent Labs Lab 09/27/16 1109  APTT 40*  INR 1.25    Sepsis Markers  Recent Labs Lab 09/27/16 0603 09/27/16 0908 09/27/16 1109 09/28/16 0301 09/29/16 0508  LATICACIDVEN 6.6* 4.5* 2.1*  --   --   PROCALCITON  --   --  36.65 54.54 26.25    ABG  Recent Labs Lab 09/27/16 0614 09/27/16 1109 09/28/16 0459  PHART 7.18* 7.20* 7.24*  PCO2ART 32 34 36  PO2ART 279* 104 99    Liver Enzymes  Recent Labs Lab 09/27/16 0603 09/27/16 1109  09/28/16 2130 09/29/16 0112 09/29/16 0508  AST 48* 52*  --   --   --   --   ALT 19 22  --   --   --   --   ALKPHOS 77 67  --   --   --   --   BILITOT 1.0 0.9  --   --   --   --  ALBUMIN 2.8* 2.6*  < > 2.1* 2.1* 1.9*  < > = values in this interval not displayed.  Cardiac Enzymes  Recent Labs Lab 09/27/16 0603  TROPONINI 0.03*    Glucose  Recent Labs Lab 09/28/16 0345 09/28/16 1730 09/28/16 1947 09/28/16 2350 09/29/16 0350 09/29/16 0725  GLUCAP 172* 128* 138* 127* 139* 126*    Imaging Dg Chest Port 1 View  Result Date: 09/28/2016 CLINICAL DATA:  Central line placement EXAM: PORTABLE CHEST 1 VIEW COMPARISON:  09/28/2016 FINDINGS: Cardiomediastinal silhouette is stable. Stable endotracheal and NG tube position. Stable right IJ central line. There is new left IJ central line with tip in SVC right atrium junction. No pneumothorax. IMPRESSION: New left IJ central line with tip in SVC right atrium junction. No pneumothorax. Electronically Signed   By: Lahoma Crocker M.D.   On:  09/28/2016 14:36    ASSESSMENT / PLAN: 45 yo obese white female admitted to the ICU for septic shock?UTI with severe metabolic acidosis, acute encephalopathy and acute respiratory failure.  Intubated and on mechanical ventilation.  PULMONARY A: Acute respiratory failure P:   Continue full vent support, wean as tolerated Continue Bronchodilator Continue Steroids Continue fentanyl/versed for sedation  CARDIOVASCULAR A:  Severe septic shock P:  Continuous Telemetry On multiple pressors Will benefit from Arterial line-will consult [facility] Keep MAP goal>65 CVP remains low F/U ECHO   RENAL A:   AKI possibly related to severe sepsis Hyperkalemia Hypocalcemia P:   Was given multiple bolus over night Worsening renal function Replace electrolytes per ICU protocol Replaced 1gm of calcium Now on CRRT Follow chemistry   GASTROINTESTINAL A:   No active issues P:   Consider Tf's Famotidine for GIP  HEMATOLOGIC A:   No active issues P:  Heparin for DVT prophylaxis Transfuse if Hgb<7 Follow cbc  INFECTIOUS A:   Severe septic shock P:   Continue broad spectrum antibiotics Follow cultures Monitor fever curve Follow CBC Procalcitonin-54.54  ENDOCRINE A:   No active issues  P:   Blood Glucose checks with BMP   NEUROLOGIC A:   Acute encephalopathy related to severe sepsis P:   RASS goal: 0 to -1 Minimize sedation WUA daily   I have personally obtained a history, examined the patient, evaluated Pertinent laboratory and [employer]/imaging results, and  formulated the assessment and plan   The Patient requires high complexity decision making for assessment and support, frequent evaluation and titration of therapies, application of advanced monitoring technologies and extensive interpretation of multiple databases. Critical Care Time devoted to patient care services described in this note is 40 minutes.   Overall, patient is critically ill,  prognosis is guarded.  Patient with Multiorgan failure and at high risk for cardiac arrest and death.    Corrin Parker, M.D.  [facility]  Medical Director [place] Director [facility] Cardio-Pulmonary Department

## 2016-09-30 DIAGNOSIS — F203 Undifferentiated schizophrenia: Secondary | ICD-10-CM

## 2016-09-30 DIAGNOSIS — N171 Acute kidney failure with acute cortical necrosis: Secondary | ICD-10-CM

## 2016-09-30 LAB — CBC WITH DIFFERENTIAL/PLATELET
BASOS ABS: 0 10*3/uL (ref 0–0.1)
BASOS PCT: 0 %
Eosinophils Absolute: 0 10*3/uL (ref 0–0.7)
Eosinophils Relative: 0 %
HEMATOCRIT: 29.7 % — AB (ref 35.0–47.0)
Hemoglobin: 10.1 g/dL — ABNORMAL LOW (ref 12.0–16.0)
LYMPHS PCT: 19 %
Lymphs Abs: 1.7 10*3/uL (ref 1.0–3.6)
MCH: 31.2 pg (ref 26.0–34.0)
MCHC: 34.1 g/dL (ref 32.0–36.0)
MCV: 91.5 fL (ref 80.0–100.0)
Monocytes Absolute: 0.3 10*3/uL (ref 0.2–0.9)
Monocytes Relative: 3 %
NEUTROS ABS: 6.6 10*3/uL — AB (ref 1.4–6.5)
NEUTROS PCT: 78 %
PLATELETS: 138 10*3/uL — AB (ref 150–440)
RBC: 3.24 MIL/uL — AB (ref 3.80–5.20)
RDW: 14.3 % (ref 11.5–14.5)
WBC: 8.6 10*3/uL (ref 3.6–11.0)

## 2016-09-30 LAB — RENAL FUNCTION PANEL
ALBUMIN: 1.7 g/dL — AB (ref 3.5–5.0)
ALBUMIN: 1.9 g/dL — AB (ref 3.5–5.0)
ALBUMIN: 1.8 g/dL — AB (ref 3.5–5.0)
ALBUMIN: 1.9 g/dL — AB (ref 3.5–5.0)
ANION GAP: 6 (ref 5–15)
ANION GAP: 6 (ref 5–15)
ANION GAP: 6 (ref 5–15)
ANION GAP: 5 (ref 5–15)
ANION GAP: 6 (ref 5–15)
Albumin: 1.7 g/dL — ABNORMAL LOW (ref 3.5–5.0)
Albumin: 1.9 g/dL — ABNORMAL LOW (ref 3.5–5.0)
Anion gap: 6 (ref 5–15)
BUN: 26 mg/dL — AB (ref 6–20)
BUN: 24 mg/dL — ABNORMAL HIGH (ref 6–20)
BUN: 23 mg/dL — ABNORMAL HIGH (ref 6–20)
BUN: 22 mg/dL — AB (ref 6–20)
BUN: 21 mg/dL — ABNORMAL HIGH (ref 6–20)
BUN: 21 mg/dL — ABNORMAL HIGH (ref 6–20)
CALCIUM: 7.4 mg/dL — AB (ref 8.9–10.3)
CALCIUM: 7.8 mg/dL — AB (ref 8.9–10.3)
CALCIUM: 7.9 mg/dL — AB (ref 8.9–10.3)
CHLORIDE: 104 mmol/L (ref 101–111)
CHLORIDE: 105 mmol/L (ref 101–111)
CO2: 26 mmol/L (ref 22–32)
CO2: 27 mmol/L (ref 22–32)
CO2: 27 mmol/L (ref 22–32)
CO2: 28 mmol/L (ref 22–32)
CO2: 28 mmol/L (ref 22–32)
CO2: 27 mmol/L (ref 22–32)
CREATININE: 2.12 mg/dL — AB (ref 0.44–1.00)
CREATININE: 1.88 mg/dL — AB (ref 0.44–1.00)
CREATININE: 1.77 mg/dL — AB (ref 0.44–1.00)
Calcium: 7.6 mg/dL — ABNORMAL LOW (ref 8.9–10.3)
Calcium: 7.9 mg/dL — ABNORMAL LOW (ref 8.9–10.3)
Calcium: 7.8 mg/dL — ABNORMAL LOW (ref 8.9–10.3)
Chloride: 103 mmol/L (ref 101–111)
Chloride: 104 mmol/L (ref 101–111)
Chloride: 104 mmol/L (ref 101–111)
Chloride: 105 mmol/L (ref 101–111)
Creatinine, Ser: 1.73 mg/dL — ABNORMAL HIGH (ref 0.44–1.00)
Creatinine, Ser: 1.82 mg/dL — ABNORMAL HIGH (ref 0.44–1.00)
Creatinine, Ser: 1.61 mg/dL — ABNORMAL HIGH (ref 0.44–1.00)
GFR calc Af Amer: 31 mL/min — ABNORMAL LOW (ref >60)
GFR calc Af Amer: 36 mL/min — ABNORMAL LOW (ref >60)
GFR calc Af Amer: 40 mL/min — ABNORMAL LOW (ref >60)
GFR calc non Af Amer: 31 mL/min — ABNORMAL LOW (ref >60)
GFR calc non Af Amer: 34 mL/min — ABNORMAL LOW (ref >60)
GFR calc non Af Amer: 35 mL/min — ABNORMAL LOW (ref >60)
GFR, EST AFRICAN AMERICAN: 39 mL/min — AB (ref >60)
GFR, EST AFRICAN AMERICAN: 38 mL/min — AB (ref >60)
GFR, EST AFRICAN AMERICAN: 44 mL/min — AB (ref >60)
GFR, EST NON AFRICAN AMERICAN: 27 mL/min — AB (ref >60)
GFR, EST NON AFRICAN AMERICAN: 32 mL/min — AB (ref >60)
GFR, EST NON AFRICAN AMERICAN: 38 mL/min — AB (ref >60)
GLUCOSE: 115 mg/dL — AB (ref 65–99)
GLUCOSE: 104 mg/dL — AB (ref 65–99)
GLUCOSE: 96 mg/dL (ref 65–99)
Glucose, Bld: 127 mg/dL — ABNORMAL HIGH (ref 65–99)
Glucose, Bld: 133 mg/dL — ABNORMAL HIGH (ref 65–99)
Glucose, Bld: 95 mg/dL (ref 65–99)
PHOSPHORUS: 3.3 mg/dL (ref 2.5–4.6)
PHOSPHORUS: 2.7 mg/dL (ref 2.5–4.6)
PHOSPHORUS: 2.5 mg/dL (ref 2.5–4.6)
PHOSPHORUS: 2.1 mg/dL — AB (ref 2.5–4.6)
PHOSPHORUS: 1.6 mg/dL — AB (ref 2.5–4.6)
POTASSIUM: 3.8 mmol/L (ref 3.5–5.1)
POTASSIUM: 3.7 mmol/L (ref 3.5–5.1)
POTASSIUM: 3.6 mmol/L (ref 3.5–5.1)
Phosphorus: 2 mg/dL — ABNORMAL LOW (ref 2.5–4.6)
Potassium: 3.8 mmol/L (ref 3.5–5.1)
Potassium: 3.8 mmol/L (ref 3.5–5.1)
Potassium: 4 mmol/L (ref 3.5–5.1)
SODIUM: 136 mmol/L (ref 135–145)
SODIUM: 136 mmol/L (ref 135–145)
SODIUM: 138 mmol/L (ref 135–145)
Sodium: 137 mmol/L (ref 135–145)
Sodium: 138 mmol/L (ref 135–145)
Sodium: 138 mmol/L (ref 135–145)

## 2016-09-30 LAB — GLUCOSE, CAPILLARY
GLUCOSE-CAPILLARY: 100 mg/dL — AB (ref 65–99)
GLUCOSE-CAPILLARY: 107 mg/dL — AB (ref 65–99)
GLUCOSE-CAPILLARY: 87 mg/dL (ref 65–99)
Glucose-Capillary: 112 mg/dL — ABNORMAL HIGH (ref 65–99)
Glucose-Capillary: 85 mg/dL (ref 65–99)
Glucose-Capillary: 85 mg/dL (ref 65–99)

## 2016-09-30 LAB — PHOSPHORUS: Phosphorus: 2 mg/dL — ABNORMAL LOW (ref 2.5–4.6)

## 2016-09-30 MED ORDER — OLANZAPINE 5 MG PO TBDP
10.0000 mg | ORAL_TABLET | Freq: Every day | ORAL | Status: DC
  Administered 2016-09-30 – 2016-10-02 (×3): 10 mg via ORAL
  Filled 2016-09-30 (×4): qty 1

## 2016-09-30 MED ORDER — DEXTROSE 5 % IV SOLN
30.0000 mmol | Freq: Once | INTRAVENOUS | Status: AC
  Administered 2016-10-01: 30 mmol via INTRAVENOUS
  Filled 2016-09-30: qty 10

## 2016-09-30 MED ORDER — LORAZEPAM 0.5 MG PO TABS
1.0000 mg | ORAL_TABLET | Freq: Every morning | ORAL | Status: DC
  Administered 2016-10-04: 1 mg via ORAL
  Filled 2016-09-30 (×2): qty 2

## 2016-09-30 MED ORDER — LORAZEPAM 2 MG/ML IJ SOLN
1.0000 mg | Freq: Once | INTRAMUSCULAR | Status: AC
  Administered 2016-09-30: 1 mg via INTRAVENOUS

## 2016-09-30 MED ORDER — ORAL CARE MOUTH RINSE
15.0000 mL | Freq: Two times a day (BID) | OROMUCOSAL | Status: DC
  Administered 2016-10-01 – 2016-10-11 (×18): 15 mL via OROMUCOSAL

## 2016-09-30 MED ORDER — LORAZEPAM 2 MG/ML IJ SOLN
2.0000 mg | Freq: Four times a day (QID) | INTRAMUSCULAR | Status: DC | PRN
Start: 2016-09-30 — End: 2016-09-30

## 2016-09-30 MED ORDER — POTASSIUM PHOSPHATES 15 MMOLE/5ML IV SOLN
30.0000 mmol | Freq: Once | INTRAVENOUS | Status: DC
  Filled 2016-09-30: qty 10

## 2016-09-30 MED ORDER — HALOPERIDOL LACTATE 5 MG/ML IJ SOLN
2.0000 mg | INTRAMUSCULAR | Status: DC | PRN
Start: 2016-09-30 — End: 2016-10-01
  Administered 2016-09-30 – 2016-10-01 (×4): 2 mg via INTRAVENOUS
  Filled 2016-09-30 (×4): qty 1

## 2016-09-30 MED ORDER — DEXMEDETOMIDINE HCL IN NACL 400 MCG/100ML IV SOLN
0.2000 ug/kg/h | INTRAVENOUS | Status: DC
  Administered 2016-09-30: 0.4 ug/kg/h via INTRAVENOUS
  Administered 2016-10-01: 0.5 ug/kg/h via INTRAVENOUS
  Administered 2016-10-01: 0.8 ug/kg/h via INTRAVENOUS
  Administered 2016-10-02 (×2): 1 ug/kg/h via INTRAVENOUS
  Administered 2016-10-02: 0.2 ug/kg/h via INTRAVENOUS
  Administered 2016-10-03: 0.6 ug/kg/h via INTRAVENOUS
  Administered 2016-10-03: 0.4 ug/kg/h via INTRAVENOUS
  Administered 2016-10-03: 0.5 ug/kg/h via INTRAVENOUS
  Administered 2016-10-03: 1.2 ug/kg/h via INTRAVENOUS
  Administered 2016-10-04: 0.6 ug/kg/h via INTRAVENOUS
  Administered 2016-10-04: 0.8 ug/kg/h via INTRAVENOUS
  Administered 2016-10-04: 0.3 ug/kg/h via INTRAVENOUS
  Administered 2016-10-04: 0.6 ug/kg/h via INTRAVENOUS
  Administered 2016-10-05: 0.4 ug/kg/h via INTRAVENOUS
  Administered 2016-10-05: 0.5 ug/kg/h via INTRAVENOUS
  Filled 2016-09-30 (×17): qty 100

## 2016-09-30 MED ORDER — CHLORHEXIDINE GLUCONATE 0.12 % MT SOLN
15.0000 mL | Freq: Two times a day (BID) | OROMUCOSAL | Status: DC
  Administered 2016-09-30 – 2016-10-18 (×29): 15 mL via OROMUCOSAL
  Filled 2016-09-30 (×23): qty 15

## 2016-09-30 MED ORDER — HALOPERIDOL LACTATE 5 MG/ML IJ SOLN
2.0000 mg | INTRAMUSCULAR | Status: AC
  Administered 2016-09-30: 2 mg via INTRAVENOUS
  Filled 2016-09-30: qty 1

## 2016-09-30 MED ORDER — LORAZEPAM 2 MG/ML IJ SOLN
2.0000 mg | INTRAMUSCULAR | Status: DC | PRN
Start: 2016-09-30 — End: 2016-10-06
  Administered 2016-09-30 – 2016-10-05 (×21): 2 mg via INTRAVENOUS
  Filled 2016-09-30 (×23): qty 1

## 2016-09-30 MED ORDER — LORAZEPAM 2 MG/ML IJ SOLN
2.0000 mg | INTRAMUSCULAR | Status: AC
  Administered 2016-09-30: 2 mg via INTRAVENOUS
  Filled 2016-09-30: qty 1

## 2016-09-30 MED ORDER — LORAZEPAM 2 MG/ML IJ SOLN
INTRAMUSCULAR | Status: AC
  Administered 2016-09-30: 1 mg via INTRAVENOUS
  Filled 2016-09-30: qty 1

## 2016-09-30 NOTE — Progress Notes (Signed)
Pharmacy Antibiotic / CRRT Dose Adjustment Note  [name] is a 45 y.o. female admitted on 09/27/2016 with sepsis from unknown source with severe metabolic acidosis and acute encephalopathy. Pharmacy has been consulted for vancomycin and piperacillin/tazobactam dosing and to dose adjust medications based on CRRT. Pt is currently on full vent support with multiorgan failure beginning CRRT 11/29, adding ultrafiltration of 50 cc/hour on 12/1.  Plan: Antibiotics: Continue piperacillin/tazobactam 3.375 g IE q 8 hours  Per rounds, vancomycin was d/c  CRRT scheduled non antibiotics: No medications require adjustment at present. Will continue to monitor CRRT and for initiation of home medications. Per RN 12/1, pt still unable to swallow  Height: 5\' 6"  (167.6 cm) Weight: 294 lb 12.1 oz (133.7 kg) IBW/kg (Calculated) : 59.3  Temp (24hrs), Avg:98.2 F (36.8 C), Min:96.6 F (35.9 C), Max:99.1 F (37.3 C)   Recent Labs Lab 09/27/16 0603 09/27/16 0908 09/27/16 1109 09/28/16 0301  09/29/16 0508  09/29/16 1732 09/29/16 2215 09/30/16 0154 09/30/16 0607 09/30/16 1015  WBC 25.9*  --  16.7* 10.9  --  13.0*  --   --   --   --  8.6  --   CREATININE 4.29*  --  3.98* 4.01*  < > 2.94*  2.91*  < > 2.07* 2.01* 2.12* 1.88* 1.77*  LATICACIDVEN 6.6* 4.5* 2.1*  --   --   --   --   --   --   --   --   --   < > = values in this interval not displayed.  Estimated Creatinine Clearance: 56.5 mL/min (by C-G formula based on SCr of 1.77 mg/dL (H)).    Allergies  Allergen Reactions  . Peanut Oil Other (See Comments)    Face turns red  . Risperidone And Related Cough    Antimicrobials this admission: Vancomycin 11/28 >> 12/1 Piperacillin/tazo 11/28 >>   Dose adjustments this admission:   Microbiology results: 11/28 BCx: NG x 3 days 11/28 UCx: No growth but collected after antibiotic administration 11/28 MRSA PCR: negative  Thank you for allowing pharmacy to be a part of this patient's  care.  Darrow Bussing, PharmD Pharmacy Resident 09/30/2016 11:27 AM

## 2016-09-30 NOTE — Progress Notes (Signed)
Patient extubated at 0915, bilateral lung sounds rhonchus. Extubated to 2L [place], tolerating well. Patient did become agitated and paranoid after extubation, IV ativan given. Patient calm for about an hour. New agitation currently, Dr. Weber Cooks saw patient and instructed to given ativan and haldol together. Waiting on orders. Wilnette Kales

## 2016-09-30 NOTE — Progress Notes (Signed)
Brief nutrition follow-up:  Pt s/p extubation this AM, NG tube removed and TF discontinued with extubation. Currently on 2L [place]. Pt currently NPO. Remains on CRRT. Pt with extensive psych history, received ativan for agitation this AM, evaluated by psych today.  Await diet progression post extubation. Continue to assess. Recommend nutritional supplement once diet advanced; if unable to advance diet over the weekend, may need to consider re-initiation of nutrition support due to increased needs with CRRT  Kerman Passey MS, [place], LDN [phone] Pager  [phone] Weekend/On-Call Pager

## 2016-09-30 NOTE — Progress Notes (Addendum)
Haldol and ativan combination decreases patient's agitation and anxiety. Per Dr. Mortimer Fries change ativan order to q2h, and use as needed with haldol. Order changed. Speech consult also placed per Dr. Zoila Shutter order. Nursing swallow evaluation not completed d/t patient's mental status. Patient is able to follow commands, however attention/concentration is lacking as well as orientation (a&ox1 only) . Will continue to monitor.  Wilnette Kales

## 2016-09-30 NOTE — Consult Note (Signed)
[facility] Consult   Reason for Consult:  Consult for 45 year old woman with a history of schizophrenia currently in the intensive care unit with sepsis Referring Physician:  Mortimer Fries Patient Identification: [name] MRN:  [id number] Principal Diagnosis: Schizophrenia Diagnosis:   Patient Active Problem List   Diagnosis Date Noted  . Schizophrenia ([place]) [F20.9] 09/30/2016  . Acute renal failure (ARF) ([place]) [N17.9]   . Respiratory failure ([place]) [J96.90] 09/27/2016  . Other sign and symptom in breast [N64.59]     Total Time spent with patient: 1 hour  Subjective:   [name] is a 45 y.o. female patient admitted with patient was not able to give any history currently because of medical condition.  HPI:  Patient evaluated. Spoke briefly with the patient although she is not able to communicate well right now. Spoke with her aunt who is at bedside with her. I reviewed the chart and spoke to a worker at the patient's group home and look through the old chart. 45 year old woman with a long-standing history of schizophrenia that appears to of been relatively stable. Admitted to the hospital yesterday with what sounds like acute sepsis. Patient has been unable to take oral medication and has been off her medicine for at least a couple days. On interview and evaluation I found the patient to be awake and have some responsiveness. She was able to shake her head and nod slightly although I don't think it's consistently appropriate answers. She appears to be able to follow very simple one-step commands as long as it's not overly physically taxing. Patient is not reported to of been agitated or aggressive or violent. Not showing acute bizarre behavior. I was able to obtain a list of her medicines from her group home. Patient continues to be maintained on clozapine as her primary antipsychotic. She's been on this for years. Also takes small doses of lorazepam and citalopram and as  needed doses of Risperdal. No report that she had had any recent change in her behavior. There was a report that when she came into the hospital she had been attending school within the last day which would suggest that she's probably doing relatively well.  Medical history: Acute sepsis with multiorgan failure in the intensive care unit. Longer term the patient has a known history of Crohn's disease. Also high blood pressure and dyslipidemia  Substance abuse history: None  Social history: I spoke with her aunt from [place] who is at the bedside. That on reports that as far she knows she is the closest relative. Not clear whether the patient has a guardian although she suspects she might  Past Psychiatric History: Patient has a long-standing history of schizophrenia. Appears to of been able to stay stable in the community for a long period of time. The only previous psychiatric note I have on her is a consult from 2013 when I saw her when she was in the hospital acutely sick. I can't find any other record of any psychiatric hospitalizations. She goes to [facility] and gets her medicine prescribed by Dr. Jacqualine Code. Has been on clozapine a long time. There is at least one note in the chart from years ago mentioning that she was acting bizarre or making delusional statements while sick.  Risk to Self:  no known acute risk Risk to Others:  no known risk Prior Inpatient Therapy:  unknown. We don't have any record of it Prior Outpatient Therapy:  long-standing outpatient mental health treatment at [facility] or schizophrenia  Past Medical History:  Past Medical History:  Diagnosis Date  . Abnormal mammogram, unspecified 2013   Prev. cytology,hypercellular smears without evidence of malignant cells. The cytopathologist questioned if samples truly representative. Care taken during sampling and is felt to be representative.  . Breast screening, unspecified 2013  . Broken leg    age 25  . Cellulitis   . Crohn's  disease ([place]) 2013  . Depression   . Diabetes mellitus without complication (HCC)    non insulin dependent  . Early menopause   . Edema   . GERD (gastroesophageal reflux disease)   . Hiatal hernia 2013  . Hyperlipidemia   . Hypertension   . Hypothyroidism   . 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 Obesity, unspecified 2013  . Other sign and symptom in breast 2013   Right bst US,lower outer quadrant,A single 0.3x0.4x0.6cm hypoechoic mass with slightly lobulated borders with adjacent 0.3x0.4x0.5cm mass was noted. The 1st was 5cm from nipple, 2nd at 8 cm from the nipple. The previous lesion aspirate was at 3 0'clock position. These lesions are thought to account for the mammographic abnormality. Minimal interval change on [place].   [facility])   . Rib fracture    age 63  . Schizophrenia ([place])   . TBI (traumatic brain injury) ([place])   . Thyroid disease    hypothyroid  . Vitamin D deficiency     Past Surgical History:  Procedure Laterality Date  . COLONOSCOPY WITH PROPOFOL N/A 02/16/2016   Procedure: COLONOSCOPY WITH PROPOFOL;  Surgeon: Lollie Sails, MD;  Location[facility] ENDOSCOPY;  Service: Endoscopy;  Laterality: N/A;  . COLONOSCOPY WITH PROPOFOL N/A 02/22/2016   Procedure: COLONOSCOPY WITH PROPOFOL;  Surgeon: Lollie Sails, MD;  Location[facility] ENDOSCOPY;  Service: Endoscopy;  Laterality: N/A;  . EYE SURGERY Left 2013   cataract surgery  . TUMOR EXCISION Right    age of 71, tumor removed from RUE   Family History:  Family History  Problem Relation Age of Onset  . Cancer Mother 43    colon  . Cancer Paternal Aunt    Family Psychiatric  History: No information available from the patient or her family right now. Social History:  History  Alcohol Use No     History  Drug Use No    Social History   Social History  . Marital status: Single    Spouse name: N/A  . Number of children: N/A  . Years of education: N/A   Social History  Main Topics  . Smoking status: Never Smoker  . Smokeless tobacco: Never Used  . Alcohol use No  . Drug use: No  . Sexual activity: Not Asked   Other Topics Concern  . None   Social History Narrative  . None   Additional Social History:    Allergies:   Allergies  Allergen Reactions  . Peanut Oil Other (See Comments)    Face turns red  . Risperidone And Related Cough    Labs:  Results for orders placed or performed during the hospital encounter of 09/27/16 (from the past 48 hour(s))  Renal function     Status: Abnormal   Collection Time: 09/28/16  2:48 PM  Result Value Ref Range   Sodium 133 (L) 135 - 145 mmol/L   Potassium 5.3 (H) 3.5 - 5.1 mmol/L   Chloride 105 101 - 111 mmol/L   CO2 16 (L) 22 - 32 mmol/L  Glucose, Bld 145 (H) 65 - 99 mg/dL   BUN 62 (H) 6 - 20 mg/dL   Creatinine, Ser 4.60 (H) 0.44 - 1.00 mg/dL   Calcium 6.0 (LL) 8.9 - 10.3 mg/dL    Comment: CRITICAL RESULT CALLED TO, READ BACK BY AND VERIFIED WITH: JAMIE SMITH GREGORY AT 5449 09/28/2016 BY TFK.    Phosphorus 5.7 (H) 2.5 - 4.6 mg/dL   Albumin 2.0 (L) 3.5 - 5.0 g/dL   GFR calc non Af Amer 11 (L) >60 mL/min   GFR calc Af Amer 12 (L) >60 mL/min    Comment: (NOTE) The eGFR has been calculated using the CKD EPI equation. This calculation has not been validated in all clinical situations. eGFR's persistently <60 mL/min signify possible Chronic Kidney Disease.    Anion gap 12 5 - 15  Glucose, capillary     Status: Abnormal   Collection Time: 09/28/16  5:30 PM  Result Value Ref Range   Glucose-Capillary 128 (H) 65 - 99 mg/dL  Glucose, capillary     Status: Abnormal   Collection Time: 09/28/16  7:47 PM  Result Value Ref Range   Glucose-Capillary 138 (H) 65 - 99 mg/dL  Renal function     Status: Abnormal   Collection Time: 09/28/16  9:30 PM  Result Value Ref Range   Sodium 132 (L) 135 - 145 mmol/L   Potassium 4.7 3.5 - 5.1 mmol/L   Chloride 102 101 - 111 mmol/L   CO2 19 (L) 22 - 32 mmol/L    Glucose, Bld 174 (H) 65 - 99 mg/dL   BUN 52 (H) 6 - 20 mg/dL   Creatinine, Ser 3.84 (H) 0.44 - 1.00 mg/dL   Calcium 6.8 (L) 8.9 - 10.3 mg/dL   Phosphorus 5.1 (H) 2.5 - 4.6 mg/dL   Albumin 2.1 (L) 3.5 - 5.0 g/dL   GFR calc non Af Amer 13 (L) >60 mL/min   GFR calc Af Amer 15 (L) >60 mL/min    Comment: (NOTE) The eGFR has been calculated using the CKD EPI equation. This calculation has not been validated in all clinical situations. eGFR's persistently <60 mL/min signify possible Chronic Kidney Disease.    Anion gap 11 5 - 15  Glucose, capillary     Status: Abnormal   Collection Time: 09/28/16 11:50 PM  Result Value Ref Range   Glucose-Capillary 127 (H) 65 - 99 mg/dL  Renal function     Status: Abnormal   Collection Time: 09/29/16  1:12 AM  Result Value Ref Range   Sodium 133 (L) 135 - 145 mmol/L   Potassium 4.2 3.5 - 5.1 mmol/L   Chloride 102 101 - 111 mmol/L   CO2 21 (L) 22 - 32 mmol/L   Glucose, Bld 151 (H) 65 - 99 mg/dL   BUN 45 (H) 6 - 20 mg/dL   Creatinine, Ser 3.41 (H) 0.44 - 1.00 mg/dL   Calcium 7.1 (L) 8.9 - 10.3 mg/dL   Phosphorus 4.9 (H) 2.5 - 4.6 mg/dL   Albumin 2.1 (L) 3.5 - 5.0 g/dL   GFR calc non Af Amer 15 (L) >60 mL/min   GFR calc Af Amer 18 (L) >60 mL/min    Comment: (NOTE) The eGFR has been calculated using the CKD EPI equation. This calculation has not been validated in all clinical situations. eGFR's persistently <60 mL/min signify possible Chronic Kidney Disease.    Anion gap 10 5 - 15  Glucose, capillary     Status: Abnormal   Collection Time: 09/29/16  3:50 AM  Result Value Ref Range   Glucose-Capillary 139 (H) 65 - 99 mg/dL  Procalcitonin     Status: None   Collection Time: 09/29/16  5:08 AM  Result Value Ref Range   Procalcitonin 26.25 ng/mL    Comment:        Interpretation: PCT >= 10 ng/mL: Important systemic inflammatory response, almost exclusively due to severe bacterial sepsis or septic shock. (NOTE)         ICU PCT Algorithm                Non ICU PCT Algorithm    ----------------------------     ------------------------------         PCT < 0.25 ng/mL                 PCT < 0.1 ng/mL     Stopping of antibiotics            Stopping of antibiotics       strongly encouraged.               strongly encouraged.    ----------------------------     ------------------------------       PCT level decrease by               PCT < 0.25 ng/mL       >= 80% from peak PCT       OR PCT 0.25 - 0.5 ng/mL          Stopping of antibiotics                                             encouraged.     Stopping of antibiotics           encouraged.    ----------------------------     ------------------------------       PCT level decrease by              PCT >= 0.25 ng/mL       < 80% from peak PCT        AND PCT >= 0.5 ng/mL             Continuing antibiotics                                              encouraged.       Continuing antibiotics            encouraged.    ----------------------------     ------------------------------     PCT level increase compared          PCT > 0.5 ng/mL         with peak PCT AND          PCT >= 0.5 ng/mL             Escalation of antibiotics                                          strongly encouraged.      Escalation of antibiotics        strongly encouraged.   Renal function  Status: Abnormal   Collection Time: 09/29/16  5:08 AM  Result Value Ref Range   Sodium 133 (L) 135 - 145 mmol/L   Potassium 4.1 3.5 - 5.1 mmol/L   Chloride 101 101 - 111 mmol/L   CO2 22 22 - 32 mmol/L   Glucose, Bld 142 (H) 65 - 99 mg/dL   BUN 39 (H) 6 - 20 mg/dL   Creatinine, Ser 2.94 (H) 0.44 - 1.00 mg/dL   Calcium 7.1 (L) 8.9 - 10.3 mg/dL   Phosphorus 4.7 (H) 2.5 - 4.6 mg/dL   Albumin 1.9 (L) 3.5 - 5.0 g/dL   GFR calc non Af Amer 18 (L) >60 mL/min   GFR calc Af Amer 21 (L) >60 mL/min    Comment: (NOTE) The eGFR has been calculated using the CKD EPI equation. This calculation has not been validated in all clinical  situations. eGFR's persistently <60 mL/min signify possible Chronic Kidney Disease.    Anion gap 10 5 - 15  CBC with Differential/Platelet     Status: Abnormal   Collection Time: 09/29/16  5:08 AM  Result Value Ref Range   WBC 13.0 (H) 3.6 - 11.0 K/uL   RBC 3.57 (L) 3.80 - 5.20 MIL/uL   Hemoglobin 11.1 (L) 12.0 - 16.0 g/dL   HCT 32.7 (L) 35.0 - 47.0 %   MCV 91.8 80.0 - 100.0 fL   MCH 31.1 26.0 - 34.0 pg   MCHC 33.9 32.0 - 36.0 g/dL   RDW 15.1 (H) 11.5 - 14.5 %   Platelets 224 150 - 440 K/uL   Neutrophils Relative % 86 %   Neutro Abs 11.1 (H) 1.4 - 6.5 K/uL   Lymphocytes Relative 12 %   Lymphs Abs 1.5 1.0 - 3.6 K/uL   Monocytes Relative 2 %   Monocytes Absolute 0.3 0.2 - 0.9 K/uL   Eosinophils Relative 0 %   Eosinophils Absolute 0.0 0 - 0.7 K/uL   Basophils Relative 0 %   Basophils Absolute 0.0 0 - 0.1 K/uL  Basic metabolic panel     Status: Abnormal   Collection Time: 09/29/16  5:08 AM  Result Value Ref Range   Sodium 134 (L) 135 - 145 mmol/L   Potassium 4.1 3.5 - 5.1 mmol/L   Chloride 102 101 - 111 mmol/L   CO2 23 22 - 32 mmol/L   Glucose, Bld 146 (H) 65 - 99 mg/dL   BUN 40 (H) 6 - 20 mg/dL   Creatinine, Ser 2.91 (H) 0.44 - 1.00 mg/dL   Calcium 7.1 (L) 8.9 - 10.3 mg/dL   GFR calc non Af Amer 18 (L) >60 mL/min   GFR calc Af Amer 21 (L) >60 mL/min    Comment: (NOTE) The eGFR has been calculated using the CKD EPI equation. This calculation has not been validated in all clinical situations. eGFR's persistently <60 mL/min signify possible Chronic Kidney Disease.    Anion gap 9 5 - 15  Glucose, capillary     Status: Abnormal   Collection Time: 09/29/16  7:25 AM  Result Value Ref Range   Glucose-Capillary 126 (H) 65 - 99 mg/dL  Renal function     Status: Abnormal   Collection Time: 09/29/16  9:34 AM  Result Value Ref Range   Sodium 133 (L) 135 - 145 mmol/L   Potassium 4.0 3.5 - 5.1 mmol/L   Chloride 101 101 - 111 mmol/L   CO2 23 22 - 32 mmol/L   Glucose, Bld 139  (H) 65 - 99  mg/dL   BUN 34 (H) 6 - 20 mg/dL   Creatinine, Ser 2.49 (H) 0.44 - 1.00 mg/dL   Calcium 7.2 (L) 8.9 - 10.3 mg/dL   Phosphorus 4.1 2.5 - 4.6 mg/dL   Albumin 1.9 (L) 3.5 - 5.0 g/dL   GFR calc non Af Amer 22 (L) >60 mL/min   GFR calc Af Amer 26 (L) >60 mL/min    Comment: (NOTE) The eGFR has been calculated using the CKD EPI equation. This calculation has not been validated in all clinical situations. eGFR's persistently <60 mL/min signify possible Chronic Kidney Disease.    Anion gap 9 5 - 15  Glucose, capillary     Status: Abnormal   Collection Time: 09/29/16 11:04 AM  Result Value Ref Range   Glucose-Capillary 118 (H) 65 - 99 mg/dL  Renal function     Status: Abnormal   Collection Time: 09/29/16  2:20 PM  Result Value Ref Range   Sodium 133 (L) 135 - 145 mmol/L   Potassium 3.7 3.5 - 5.1 mmol/L   Chloride 101 101 - 111 mmol/L   CO2 24 22 - 32 mmol/L   Glucose, Bld 142 (H) 65 - 99 mg/dL   BUN 29 (H) 6 - 20 mg/dL   Creatinine, Ser 2.15 (H) 0.44 - 1.00 mg/dL   Calcium 7.5 (L) 8.9 - 10.3 mg/dL   Phosphorus 3.8 2.5 - 4.6 mg/dL   Albumin 1.9 (L) 3.5 - 5.0 g/dL   GFR calc non Af Amer 27 (L) >60 mL/min   GFR calc Af Amer 31 (L) >60 mL/min    Comment: (NOTE) The eGFR has been calculated using the CKD EPI equation. This calculation has not been validated in all clinical situations. eGFR's persistently <60 mL/min signify possible Chronic Kidney Disease.    Anion gap 8 5 - 15  Glucose, capillary     Status: Abnormal   Collection Time: 09/29/16  4:24 PM  Result Value Ref Range   Glucose-Capillary 121 (H) 65 - 99 mg/dL  Renal function     Status: Abnormal   Collection Time: 09/29/16  5:32 PM  Result Value Ref Range   Sodium 133 (L) 135 - 145 mmol/L   Potassium 3.7 3.5 - 5.1 mmol/L   Chloride 101 101 - 111 mmol/L   CO2 26 22 - 32 mmol/L   Glucose, Bld 111 (H) 65 - 99 mg/dL   BUN 26 (H) 6 - 20 mg/dL   Creatinine, Ser 2.07 (H) 0.44 - 1.00 mg/dL   Calcium 7.4 (L) 8.9 -  10.3 mg/dL   Phosphorus 3.5 2.5 - 4.6 mg/dL   Albumin 1.9 (L) 3.5 - 5.0 g/dL   GFR calc non Af Amer 28 (L) >60 mL/min   GFR calc Af Amer 32 (L) >60 mL/min    Comment: (NOTE) The eGFR has been calculated using the CKD EPI equation. This calculation has not been validated in all clinical situations. eGFR's persistently <60 mL/min signify possible Chronic Kidney Disease.    Anion gap 6 5 - 15  Phosphorus     Status: None   Collection Time: 09/29/16  5:38 PM  Result Value Ref Range   Phosphorus 3.6 2.5 - 4.6 mg/dL  Glucose, capillary     Status: Abnormal   Collection Time: 09/29/16  7:38 PM  Result Value Ref Range   Glucose-Capillary 119 (H) 65 - 99 mg/dL  Renal function     Status: Abnormal   Collection Time: 09/29/16 10:15 PM  Result Value Ref  Range   Sodium 135 135 - 145 mmol/L   Potassium 3.7 3.5 - 5.1 mmol/L   Chloride 102 101 - 111 mmol/L   CO2 25 22 - 32 mmol/L   Glucose, Bld 125 (H) 65 - 99 mg/dL   BUN 25 (H) 6 - 20 mg/dL   Creatinine, Ser 2.01 (H) 0.44 - 1.00 mg/dL   Calcium 7.5 (L) 8.9 - 10.3 mg/dL   Phosphorus 3.4 2.5 - 4.6 mg/dL   Albumin 1.9 (L) 3.5 - 5.0 g/dL   GFR calc non Af Amer 29 (L) >60 mL/min   GFR calc Af Amer 33 (L) >60 mL/min    Comment: (NOTE) The eGFR has been calculated using the CKD EPI equation. This calculation has not been validated in all clinical situations. eGFR's persistently <60 mL/min signify possible Chronic Kidney Disease.    Anion gap 8 5 - 15  Glucose, capillary     Status: Abnormal   Collection Time: 09/29/16 11:46 PM  Result Value Ref Range   Glucose-Capillary 111 (H) 65 - 99 mg/dL  Renal function     Status: Abnormal   Collection Time: 09/30/16  1:54 AM  Result Value Ref Range   Sodium 136 135 - 145 mmol/L   Potassium 3.8 3.5 - 5.1 mmol/L   Chloride 104 101 - 111 mmol/L   CO2 26 22 - 32 mmol/L   Glucose, Bld 127 (H) 65 - 99 mg/dL   BUN 26 (H) 6 - 20 mg/dL   Creatinine, Ser 2.12 (H) 0.44 - 1.00 mg/dL   Calcium 7.4 (L) 8.9  - 10.3 mg/dL   Phosphorus 3.3 2.5 - 4.6 mg/dL   Albumin 1.7 (L) 3.5 - 5.0 g/dL   GFR calc non Af Amer 27 (L) >60 mL/min   GFR calc Af Amer 31 (L) >60 mL/min    Comment: (NOTE) The eGFR has been calculated using the CKD EPI equation. This calculation has not been validated in all clinical situations. eGFR's persistently <60 mL/min signify possible Chronic Kidney Disease.    Anion gap 6 5 - 15  Glucose, capillary     Status: Abnormal   Collection Time: 09/30/16  4:34 AM  Result Value Ref Range   Glucose-Capillary 112 (H) 65 - 99 mg/dL  Renal function     Status: Abnormal   Collection Time: 09/30/16  6:07 AM  Result Value Ref Range   Sodium 136 135 - 145 mmol/L   Potassium 3.8 3.5 - 5.1 mmol/L   Chloride 103 101 - 111 mmol/L   CO2 27 22 - 32 mmol/L   Glucose, Bld 133 (H) 65 - 99 mg/dL   BUN 24 (H) 6 - 20 mg/dL   Creatinine, Ser 1.88 (H) 0.44 - 1.00 mg/dL   Calcium 7.6 (L) 8.9 - 10.3 mg/dL   Phosphorus 2.7 2.5 - 4.6 mg/dL   Albumin 1.7 (L) 3.5 - 5.0 g/dL   GFR calc non Af Amer 31 (L) >60 mL/min   GFR calc Af Amer 36 (L) >60 mL/min    Comment: (NOTE) The eGFR has been calculated using the CKD EPI equation. This calculation has not been validated in all clinical situations. eGFR's persistently <60 mL/min signify possible Chronic Kidney Disease.    Anion gap 6 5 - 15  CBC with Differential/Platelet     Status: Abnormal   Collection Time: 09/30/16  6:07 AM  Result Value Ref Range   WBC 8.6 3.6 - 11.0 K/uL   RBC 3.24 (L) 3.80 - 5.20 MIL/uL  Hemoglobin 10.1 (L) 12.0 - 16.0 g/dL   HCT 29.7 (L) 35.0 - 47.0 %   MCV 91.5 80.0 - 100.0 fL   MCH 31.2 26.0 - 34.0 pg   MCHC 34.1 32.0 - 36.0 g/dL   RDW 14.3 11.5 - 14.5 %   Platelets 138 (L) 150 - 440 K/uL   Neutrophils Relative % 78 %   Neutro Abs 6.6 (H) 1.4 - 6.5 K/uL   Lymphocytes Relative 19 %   Lymphs Abs 1.7 1.0 - 3.6 K/uL   Monocytes Relative 3 %   Monocytes Absolute 0.3 0.2 - 0.9 K/uL   Eosinophils Relative 0 %    Eosinophils Absolute 0.0 0 - 0.7 K/uL   Basophils Relative 0 %   Basophils Absolute 0.0 0 - 0.1 K/uL  Glucose, capillary     Status: Abnormal   Collection Time: 09/30/16  7:57 AM  Result Value Ref Range   Glucose-Capillary 107 (H) 65 - 99 mg/dL  Renal function     Status: Abnormal   Collection Time: 09/30/16 10:15 AM  Result Value Ref Range   Sodium 137 135 - 145 mmol/L   Potassium 4.0 3.5 - 5.1 mmol/L   Chloride 104 101 - 111 mmol/L   CO2 27 22 - 32 mmol/L   Glucose, Bld 115 (H) 65 - 99 mg/dL   BUN 23 (H) 6 - 20 mg/dL   Creatinine, Ser 1.77 (H) 0.44 - 1.00 mg/dL   Calcium 7.8 (L) 8.9 - 10.3 mg/dL   Phosphorus 2.5 2.5 - 4.6 mg/dL   Albumin 1.9 (L) 3.5 - 5.0 g/dL   GFR calc non Af Amer 34 (L) >60 mL/min   GFR calc Af Amer 39 (L) >60 mL/min    Comment: (NOTE) The eGFR has been calculated using the CKD EPI equation. This calculation has not been validated in all clinical situations. eGFR's persistently <60 mL/min signify possible Chronic Kidney Disease.    Anion gap 6 5 - 15  Glucose, capillary     Status: Abnormal   Collection Time: 09/30/16 11:56 AM  Result Value Ref Range   Glucose-Capillary 100 (H) 65 - 99 mg/dL    Current Facility-Administered Medications  Medication Dose Route Frequency Provider Last Rate Last Dose  . 0.9 %  sodium chloride infusion  250 mL Intravenous PRN Flora Lipps, MD      . 0.9 %  sodium chloride infusion  250 mL Intravenous PRN Flora Lipps, MD      . acetaminophen (TYLENOL) tablet 650 mg  650 mg Oral Q4H PRN Flora Lipps, MD   650 mg at 09/27/16 1805  . chlorhexidine gluconate (MEDLINE KIT) (PERIDEX) 0.12 % solution 15 mL  15 mL Mouth Rinse BID Flora Lipps, MD   15 mL at 09/30/16 0834  . famotidine (PEPCID) IVPB 20 mg premix  20 mg Intravenous Q48H Merilyn Baba, RPH   20 mg at 09/29/16 1052  . feeding supplement (VITAL HIGH PROTEIN) liquid 1,000 mL  1,000 mL Per Tube Continuous Flora Lipps, MD   Stopped at 09/30/16 0825  . fentaNYL (SUBLIMAZE)  bolus via infusion 50 mcg  50 mcg Intravenous Q1H PRN Flora Lipps, MD   50 mcg at 09/29/16 1015  . fentaNYL (SUBLIMAZE) injection 50 mcg  50 mcg Intravenous Once Flora Lipps, MD      . fentaNYL 2561mg in NS 2589m(1063mml) infusion-PREMIX  25-400 mcg/hr Intravenous Continuous KurFlora LippsD   Stopped at 09/30/16 0820  . heparin injection 1,000-6,000 Units  1,000-6,000  Units CRRT PRN Munsoor Lateef, MD      . heparin injection 5,000 Units  5,000 Units Subcutaneous Q8H Flora Lipps, MD   5,000 Units at 09/30/16 0605  . hydrocortisone sodium succinate (SOLU-CORTEF) 100 MG injection 50 mg  50 mg Intravenous Q6H Flora Lipps, MD   50 mg at 09/30/16 1216  . [START ON 10/01/2016] LORazepam (ATIVAN) tablet 1 mg  1 mg Oral q morning - 10a Merilyn Baba, RPH      . MEDLINE mouth rinse  15 mL Mouth Rinse QID Flora Lipps, MD   15 mL at 09/30/16 0400  . midazolam (VERSED) 5 MG/5ML injection 1 mg  1 mg Intravenous Once Loney Hering, MD      . midazolam (VERSED) injection 4 mg  4 mg Intravenous Q1H PRN Flora Lipps, MD      . norepinephrine (LEVOPHED) 16 mg in dextrose 5 % 250 mL (0.064 mg/mL) infusion  0-75 mcg/min Intravenous Titrated Awilda Bill, NP 20.6 mL/hr at 09/30/16 1100 21.973 mcg/min at 09/30/16 1100  . ondansetron (ZOFRAN) injection 4 mg  4 mg Intravenous Q6H PRN Flora Lipps, MD      . phenylephrine (NEO-SYNEPHRINE) 40 mg in dextrose 5 % 250 mL (0.16 mg/mL) infusion  30-200 mcg/min Intravenous Continuous Holley Raring, NP   Stopped at 09/30/16 0915  . piperacillin-tazobactam (ZOSYN) IVPB 3.375 g  3.375 g Intravenous Q8H Merilyn Baba, RPH   3.375 g at 09/30/16 0831  . pneumococcal 23 valent vaccine (PNU-IMMUNE) injection 0.5 mL  0.5 mL Intramuscular Tomorrow-1000 Flora Lipps, MD      . pureflow IV solution for Dialysis   CRRT Continuous Munsoor Lateef, MD 2,500 mL/hr at 09/30/16 0530 3 each at 09/30/16 0530  . sodium chloride flush (NS) 0.9 % injection 10-40 mL  10-40 mL Intracatheter  Q12H Flora Lipps, MD      . sodium chloride flush (NS) 0.9 % injection 10-40 mL  10-40 mL Intracatheter PRN Flora Lipps, MD      . sodium chloride flush (NS) 0.9 % injection 3 mL  3 mL Intravenous Q12H Flora Lipps, MD   3 mL at 09/29/16 2210  . sodium chloride flush (NS) 0.9 % injection 3 mL  3 mL Intravenous PRN Flora Lipps, MD      . vasopressin (PITRESSIN) 40 Units in sodium chloride 0.9 % 250 mL (0.16 Units/mL) infusion  0.04 Units/min Intravenous Continuous Bincy S Varughese, NP 15 mL/hr at 09/30/16 1100 0.04 Units/min at 09/30/16 1100    Musculoskeletal: Strength & Muscle Tone: decreased Gait & Station: unable to stand Patient leans: Backward  Psychiatric Specialty Exam: Physical Exam  Nursing note and vitals reviewed. Constitutional: She appears well-developed and well-nourished. She appears distressed.  HENT:  Head: Normocephalic and atraumatic.  Eyes: Conjunctivae are normal. Pupils are equal, round, and reactive to light.  Neck: Normal range of motion.  Cardiovascular: Normal heart sounds.   Respiratory: She is in respiratory distress.  GI: Soft. She exhibits distension.  Musculoskeletal: Normal range of motion.  Neurological: She is alert.  Skin: Skin is warm and dry.  Psychiatric: Her affect is blunt. Her speech is delayed. She is slowed and withdrawn. Cognition and memory are impaired.    Review of Systems  Unable to perform ROS: Medical condition    Blood pressure 107/70, pulse (!) 106, temperature 98.8 F (37.1 C), resp. rate (!) 25, height _0  (1.676 m), weight 133.7 kg (294 lb 12.1 oz), SpO2 97 %.Body mass index  is 47.57 kg/m.  General Appearance: Disheveled  Eye Contact:  Fair  Speech:  Negative  Volume:  Decreased  Mood:  Negative  Affect:  Blunt and Flat  Thought Process:  NA  Orientation:  Negative  Thought Content:  Negative  Suicidal Thoughts:  No  Homicidal Thoughts:  No  Memory:  Negative  Judgement:  Negative  Insight:  Negative  Psychomotor  Activity:  Negative  Concentration:  Concentration: Poor  Recall:  Poor  Fund of Knowledge:  Negative  Language:  Negative  Akathisia:  Negative  Handed:  Right  AIMS (if indicated):     Assets:  Financial Resources/Insurance Housing Social Support  ADL's:  Impaired  Cognition:  Impaired,  Moderate and Severe  Sleep:        Treatment Plan Summary: Daily contact with patient to assess and evaluate symptoms and progress in treatment, Medication management and Plan 45 year old woman with schizophrenia. Acutely medically ill. Currently her behavior is calm although her diagnosis does suggest that there is a risk of mental health decompensation which could lead to behavior problems if she stays off her medicine to long. Clozapine cannot be given by any route except poorly. I would probably hold off on restarting that right now. I will order continued low dose Ativan which can be given IV since she is on that normally at home. For the time being I am just going to provide as needed doses of IV Haldol or Ativan. I will sign this out to the psychiatrist over the weekend. I will follow up after the weekend.  Disposition: Patient does not meet criteria for psychiatric inpatient admission.  Alethia Berthold, MD 09/30/2016 12:17 PM

## 2016-09-30 NOTE — Consult Note (Signed)
  Psychiatry: I realized the patient is continuing to have some agitation today. I am going to add 10 mg of olanzapine orally dissolving tablet which can be administered even if she is not able to swallow pills. Continue with the when necessary Ativan and Haldol as ordered.

## 2016-09-30 NOTE — Progress Notes (Signed)
[facility] Progress Note Patient Name: [name] DOB: December 15, 1970 MRN: [id number]   Date of Service  09/30/2016  HPI/Events of Note  Patient was extubated earlier today. Patient seen, comfortable. Protecting her airway. No subjective complaints. Blood pressure [id number] systolic. Heart rate 108. Respiratory rate 18. Sats 97% on nasal cannula.   Patient has schizophrenia. She is nothing by mouth since she was just extubated. Unable to take her by mouth meds.   She had anxiety and paranoia earlier.   Still on pressors but lower dose. Nurse trying to cut down dose.   eICU Interventions  Will try precedex >> hold off if BP drops.      Intervention Category Intermediate Interventions: Other:  [place] 09/30/2016, 3:54 PM

## 2016-09-30 NOTE — Progress Notes (Signed)
[facility]  ROUNDING NOTE   Subjective:  Patient remains critically ill at this point in time. She continues on continuous renal replacement therapy. Urine output was increased over the preceding 24 hours. Blood pressure this a.m. was 96/74.  Objective:  Vital signs in last 24 hours:  Temp:  [96.6 F (35.9 C)-99.1 F (37.3 C)] 97.9 F (36.6 C) (12/01 0700) Pulse Rate:  [86-102] 88 (12/01 0700) Resp:  [14-26] 18 (12/01 0700) BP: (78-124)/(50-75) 96/74 (12/01 0700) SpO2:  [95 %-100 %] 99 % (12/01 0700) FiO2 (%):  [35 %-50 %] 35 % (12/01 0700) Weight:  [133.7 kg (294 lb 12.1 oz)] 133.7 kg (294 lb 12.1 oz) (12/01 0500)  Weight change: 5.2 kg (11 lb 7.4 oz) Filed Weights   09/28/16 0400 09/29/16 0121 09/30/16 0500  Weight: 126 kg (277 lb 12.5 oz) 128.5 kg (283 lb 4.7 oz) 133.7 kg (294 lb 12.1 oz)    Intake/Output: I/O last 3 completed shifts: In: 6445.8 [I.V.:5595.6; NG/GT:400.2; IV Piggyback:450] Out: 1025 [ENIDP:8242; Emesis/NG output:100; Stool:20]   Intake/Output this shift:  No intake/output data recorded.  Physical Exam: General: Critically ill appearing  Head: Normocephalic, atraumatic. ETT/NG in palce  Eyes: Anicteric  Neck: Supple, trachea midline  Lungs:  Scattered rhonchi, normal effort   Heart: S1S2 No rubs   Abdomen:  Umbilical hernia noted, BS present   Extremities: trace peripheral edema.  Neurologic: Sedated   Skin: No lesions  Access: L IJ temporary dialysis catheter placed 35/36/14    Basic Metabolic Panel:  Recent Labs Lab 09/27/16 1109  09/29/16 1420 09/29/16 1732 09/29/16 1738 09/29/16 2215 09/30/16 0154 09/30/16 0607  NA 132*  < > 133* 133*  --  135 136 136  K 5.9*  < > 3.7 3.7  --  3.7 3.8 3.8  CL 105  < > 101 101  --  102 104 103  CO2 16*  < > 24 26  --  _0 GLUCOSE 161*  < > 142* 111*  --  125* 127* 133*  BUN 50*  < > 29* 26*  --  25* 26* 24*  CREATININE 3.98*  < > 2.15* 2.07*  --  2.01* 2.12* 1.88*  CALCIUM  7.2*  < > 7.5* 7.4*  --  7.5* 7.4* 7.6*  MG 2.2  --   --   --   --   --   --   --   PHOS 3.2  < > 3.8 3.5 3.6 3.4 3.3 2.7  < > = values in this interval not displayed.  Liver Function Tests:  Recent Labs Lab 09/27/16 0603 09/27/16 1109  09/29/16 1420 09/29/16 1732 09/29/16 2215 09/30/16 0154 09/30/16 0607  AST 48* 52*  --   --   --   --   --   --   ALT 19 22  --   --   --   --   --   --   ALKPHOS 77 67  --   --   --   --   --   --   BILITOT 1.0 0.9  --   --   --   --   --   --   PROT 6.3* 5.7*  --   --   --   --   --   --   ALBUMIN 2.8* 2.6*  < > 1.9* 1.9* 1.9* 1.7* 1.7*  < > = values in this interval not displayed. No results for input(s): LIPASE,  AMYLASE in the last 168 hours. No results for input(s): AMMONIA in the last 168 hours.  CBC:  Recent Labs Lab 09/27/16 0603 09/27/16 1109 09/28/16 0301 09/29/16 0508 09/30/16 0607  WBC 25.9* 16.7* 10.9 13.0* 8.6  NEUTROABS 19.5* 13.6*  --  11.1* 6.6*  HGB 11.9* 11.8* 11.9* 11.1* 10.1*  HCT 35.8 35.6 35.5 32.7* 29.7*  MCV 96.7 93.8 93.0 91.8 91.5  PLT 353 283 300 224 138*    Cardiac Enzymes:  Recent Labs Lab 09/27/16 0603  TROPONINI 0.03*    BNP: Invalid input(s): POCBNP  CBG:  Recent Labs Lab 09/29/16 1104 09/29/16 1624 09/29/16 1938 09/29/16 2346 09/30/16 0434  GLUCAP 118* 121* 119* 111* 112*    Microbiology: Results for orders placed or performed during the hospital encounter of 09/27/16  Blood Culture (routine x 2)     Status: None (Preliminary result)   Collection Time: 09/27/16  6:02 AM  Result Value Ref Range Status   Specimen Description BLOOD  R AC  Final   Special Requests   Final    BOTTLES DRAWN AEROBIC AND ANAEROBIC  AER 3 ML ANA 4 ML   Culture NO GROWTH 2 DAYS  Final   Report Status PENDING  Incomplete  Blood Culture (routine x 2)     Status: None (Preliminary result)   Collection Time: 09/27/16  6:03 AM  Result Value Ref Range Status   Specimen Description BLOOD  R ARM  Final    Special Requests   Final    BOTTLES DRAWN AEROBIC AND ANAEROBIC  AER 6 ML ANA 10 ML   Culture NO GROWTH 2 DAYS  Final   Report Status PENDING  Incomplete  Urine culture     Status: None   Collection Time: 09/27/16  9:30 AM  Result Value Ref Range Status   Specimen Description URINE, RANDOM  Final   Special Requests NONE  Final   Culture NO GROWTH Performed at [facility]   Final   Report Status 09/28/2016 FINAL  Final  MRSA PCR Screening     Status: None   Collection Time: 09/27/16 11:09 AM  Result Value Ref Range Status   MRSA by PCR NEGATIVE NEGATIVE Final    Comment:        The GeneXpert MRSA Assay (FDA approved for NASAL specimens only), is one component of a comprehensive MRSA colonization surveillance program. It is not intended to diagnose MRSA infection nor to guide or monitor treatment for MRSA infections.     Coagulation Studies:  Recent Labs  09/27/16 1109  LABPROT 15.8*  INR 1.25    Urinalysis:  Recent Labs  09/27/16 0930  COLORURINE AMBER*  LABSPEC 1.024  PHURINE 5.0  GLUCOSEU 50*  HGBUR NEGATIVE  BILIRUBINUR NEGATIVE  KETONESUR NEGATIVE  PROTEINUR 100*  NITRITE NEGATIVE  LEUKOCYTESUR NEGATIVE      Imaging: Dg Chest Port 1 View  Result Date: 09/28/2016 CLINICAL DATA:  Central line placement EXAM: PORTABLE CHEST 1 VIEW COMPARISON:  09/28/2016 FINDINGS: Cardiomediastinal silhouette is stable. Stable endotracheal and NG tube position. Stable right IJ central line. There is new left IJ central line with tip in SVC right atrium junction. No pneumothorax. IMPRESSION: New left IJ central line with tip in SVC right atrium junction. No pneumothorax. Electronically Signed   By: Lahoma Crocker M.D.   On: 09/28/2016 14:36     Medications:   . feeding supplement (VITAL HIGH PROTEIN) 1,000 mL (09/30/16 0700)  . fentaNYL infusion INTRAVENOUS 200 mcg/hr (09/30/16  0700)  . norepinephrine (LEVOPHED) Adult infusion 20.053 mcg/min (09/30/16 0700)  .  phenylephrine (NEO-SYNEPHRINE) Adult infusion 30.133 mcg/min (09/30/16 0700)  . pureflow 3 each (09/30/16 0530)  . vasopressin (PITRESSIN) infusion - *FOR SHOCK* 0.04 Units/min (09/30/16 0700)   . chlorhexidine gluconate (MEDLINE KIT)  15 mL Mouth Rinse BID  . famotidine (PEPCID) IV  20 mg Intravenous Q48H  . fentaNYL (SUBLIMAZE) injection  50 mcg Intravenous Once  . heparin  5,000 Units Subcutaneous Q8H  . hydrocortisone sod succinate (SOLU-CORTEF) inj  50 mg Intravenous Q6H  . LORazepam  1 mg Oral q morning - 10a  . mouth rinse  15 mL Mouth Rinse QID  . midazolam  1 mg Intravenous Once  . piperacillin-tazobactam (ZOSYN)  IV  3.375 g Intravenous Q8H  . pneumococcal 23 valent vaccine  0.5 mL Intramuscular Tomorrow-1000  . sodium chloride flush  10-40 mL Intracatheter Q12H  . sodium chloride flush  3 mL Intravenous Q12H  . vancomycin  1,000 mg Intravenous Q24H   sodium chloride, sodium chloride, acetaminophen, fentaNYL, heparin, midazolam, ondansetron (ZOFRAN) IV, sodium chloride flush, sodium chloride flush  Assessment/ Plan:  45 y.o. female with a PMHx of Crohn's disease, depression, diabetes mellitus type 2, GERD, hiatal hernia, hyperlipidemia, hypertension, hypothyroidism, obesity, schizophrenia, traumatic brain injury, vitamin D deficiency, who was admitted to [facility] on 09/27/2016 for evaluation of acute respiratory failure.  Pt with suspected septic shock though source not identified initially.  1.  Acute renal failure due to ATN.  2.  Acute respiratory failure. 3.  Hyperkalemia. 4.  Metabolic acidosis.  5.  Septic shock. 6.  Umbilical hernia.   Plan:  It appears that the patient's urine output is increasing. Over the preceding 24 hours urine output was 1.2 L. However the patient remains in that positive for the admission. We will attempt to add ultrafiltration of 50 cc per hour. Continue to monitor blood pressure closely and maintain a map of 65 or greater.  Patient remains on  norepinephrine, phenylephrine, as well as vasopressin.  Overall however patient means critically ill at this point in time.   LOS: 3 Jonne Rote 12/1/20177:18 AM

## 2016-09-30 NOTE — Progress Notes (Signed)
Pt. Was suctioned prior to extubation for a small amount of thin secretions. Per Dr. Zoila Shutter order, she was extubated. She was placed on a 2 L nasal cannula. She is voicing and has no stridor.

## 2016-09-30 NOTE — Progress Notes (Signed)
PULMONARY / CRITICAL CARE MEDICINE   Name: [name] MRN: [id number] DOB: 1971-02-03    ADMISSION DATE:  09/27/2016 CONSULTATION DATE: 09/27/16  REFERRING MD:  Dr. Loney Hering  CHIEF COMPLAINT: Acute Respiratory Failure  Discussion:  45 yo obese white female admitted to the ICU for septic shock?UTI with severe metabolic acidosis, acute encephalopathy and acute respiratory failure.  Intubated and on mechanical ventilation.  SUBJECTIVE:  Patient remains sedated.  On multiple pressors. CVP remains low, therefore she received several fluid bolus.  Afebrile.  Attempted to place A-line but without success. Multiorgan failure Remains critically ill Plan to wean vasopressors, fio2 at 35% Plan for aggressive wean and attempt extubation    VITAL SIGNS: BP (!) 81/72   Pulse 83   Temp 98.1 F (36.7 C) (Core (Comment)) Comment (Src): tsf  Resp 18   Ht 5\' 6"  (1.676 m)   Wt 294 lb 12.1 oz (133.7 kg)   SpO2 99%   BMI 47.57 kg/m   HEMODYNAMICS: CVP:  [8 mmHg-15 mmHg] 9 mmHg  VENTILATOR SETTINGS: Vent Mode: PRVC FiO2 (%):  [35 %-50 %] 35 % Set Rate:  [16 bmp] 16 bmp Vt Set:  [500 mL] 500 mL PEEP:  [12 cmH20] 12 cmH20 Plateau Pressure:  [17 cmH20] 17 cmH20  INTAKE / OUTPUT: I/O last 3 completed shifts: In: 6445.8 [I.V.:5595.6; NG/GT:400.2; IV Piggyback:450] Out: [id number] [Urine:1375; Emesis/NG output:100; Stool:20]  PHYSICAL EXAMINATION: General:  Morbidly obese, sickly appearing white female, Intubated and on ventilator Neuro:  Opens eyes to voice, follow simple commands HEENT:  Atraumatic, normocephalic, no discharge, no JVD appreciated Cardiovascular: S1S2, Tachycadic,RRR, No MRG noted Lungs:coarse bilaterally, no wheezes, crackles, rhonchi noted Abdomen:  Firm, distended, active bowel sounds Musculoskeletal:  No inflammation/deformity noted Skin:  Grossly intact GCS<8T  LABS:  BMET  Recent Labs Lab 09/29/16 2215 09/30/16 0154 09/30/16 0607  NA 135 136  136  K 3.7 3.8 3.8  CL 102 104 103  CO2 25 26 27   BUN 25* 26* 24*  CREATININE 2.01* 2.12* 1.88*  GLUCOSE 125* 127* 133*    Electrolytes  Recent Labs Lab 09/27/16 1109  09/29/16 2215 09/30/16 0154 09/30/16 0607  CALCIUM 7.2*  < > 7.5* 7.4* 7.6*  MG 2.2  --   --   --   --   PHOS 3.2  < > 3.4 3.3 2.7  < > = values in this interval not displayed.  CBC  Recent Labs Lab 09/28/16 0301 09/29/16 0508 09/30/16 0607  WBC 10.9 13.0* 8.6  HGB 11.9* 11.1* 10.1*  HCT 35.5 32.7* 29.7*  PLT 300 224 138*    Coag's  Recent Labs Lab 09/27/16 1109  APTT 40*  INR 1.25    Sepsis Markers  Recent Labs Lab 09/27/16 0603 09/27/16 0908 09/27/16 1109 09/28/16 0301 09/29/16 0508  LATICACIDVEN 6.6* 4.5* 2.1*  --   --   PROCALCITON  --   --  36.65 54.54 26.25    ABG  Recent Labs Lab 09/27/16 0614 09/27/16 1109 09/28/16 0459  PHART 7.18* 7.20* 7.24*  PCO2ART 32 34 36  PO2ART 279* 104 99    Liver Enzymes  Recent Labs Lab 09/27/16 0603 09/27/16 1109  09/29/16 2215 09/30/16 0154 09/30/16 0607  AST 48* 52*  --   --   --   --   ALT 19 22  --   --   --   --   ALKPHOS 77 67  --   --   --   --  BILITOT 1.0 0.9  --   --   --   --   ALBUMIN 2.8* 2.6*  < > 1.9* 1.7* 1.7*  < > = values in this interval not displayed.  Cardiac Enzymes  Recent Labs Lab 09/27/16 0603  TROPONINI 0.03*    Glucose  Recent Labs Lab 09/29/16 1104 09/29/16 1624 09/29/16 1938 09/29/16 2346 09/30/16 0434 09/30/16 0757  GLUCAP 118* 121* 119* 111* 112* 107*    Imaging No results found.  ASSESSMENT / PLAN:  45 yo obese white female admitted to the ICU for septic shock?UTI with severe metabolic acidosis, acute encephalopathy and acute respiratory failure.  Intubated and on mechanical ventilation.  PULMONARY A: Acute respiratory failure P:   Continue full vent support, wean as tolerated Continue Bronchodilator Continue Steroids Continue fentanyl/versed for sedation Plan  for aggressive wean and extubation when she is agitated  CARDIOVASCULAR A:  Severe septic shock P:  Continuous Telemetry On multiple pressors Will benefit from Arterial line-will consult [facility] Keep MAP goal>65 CVP remains low F/U ECHO   RENAL A:   AKI possibly related to severe sepsis Hyperkalemia Hypocalcemia P:   Was given multiple bolus over night Worsening renal function Replace electrolytes per ICU protocol Replaced 1gm of calcium Now on CRRT Follow chemistry   GASTROINTESTINAL A:   No active issues P:   Consider Tf's Famotidine for GIP  HEMATOLOGIC A:   No active issues P:  Heparin for DVT prophylaxis Transfuse if Hgb<7 Follow cbc  INFECTIOUS A:   Severe septic shock P:   Continue broad spectrum antibiotics Follow cultures Monitor fever curve Follow CBC Procalcitonin-54.54  ENDOCRINE A:   No active issues  P:   Blood Glucose checks with BMP   NEUROLOGIC A:   Acute encephalopathy related to severe sepsis P:   RASS goal: 0 to -1 Minimize sedation WUA daily   I have personally obtained a history, examined the patient, evaluated Pertinent laboratory and RadioGraphic/imaging results, and  formulated the assessment and plan   The Patient requires high complexity decision making for assessment and support, frequent evaluation and titration of therapies, application of advanced monitoring technologies and extensive interpretation of multiple databases. Critical Care Time devoted to patient care services described in this note is 40 minutes.   Overall, patient is critically ill, prognosis is guarded.  Patient with Multiorgan failure and at high risk for cardiac arrest and death.    Corrin Parker, M.D.  [facility]  Medical Director [place] Director [facility] Cardio-Pulmonary Department

## 2016-09-30 NOTE — Progress Notes (Addendum)
Access pressure pod reset d/t low access pressure. Pressures resolved.

## 2016-10-01 ENCOUNTER — Inpatient Hospital Stay: Payer: Medicare Other

## 2016-10-01 LAB — GLUCOSE, CAPILLARY
GLUCOSE-CAPILLARY: 109 mg/dL — AB (ref 65–99)
GLUCOSE-CAPILLARY: 91 mg/dL (ref 65–99)
GLUCOSE-CAPILLARY: 90 mg/dL (ref 65–99)
GLUCOSE-CAPILLARY: 84 mg/dL (ref 65–99)
GLUCOSE-CAPILLARY: 80 mg/dL (ref 65–99)
Glucose-Capillary: 80 mg/dL (ref 65–99)

## 2016-10-01 LAB — RENAL FUNCTION PANEL
ALBUMIN: 1.9 g/dL — AB (ref 3.5–5.0)
ALBUMIN: 2 g/dL — AB (ref 3.5–5.0)
ALBUMIN: 2.1 g/dL — AB (ref 3.5–5.0)
ANION GAP: 6 (ref 5–15)
ANION GAP: 8 (ref 5–15)
ANION GAP: 7 (ref 5–15)
ANION GAP: 8 (ref 5–15)
ANION GAP: 8 (ref 5–15)
Albumin: 1.7 g/dL — ABNORMAL LOW (ref 3.5–5.0)
Albumin: 1.9 g/dL — ABNORMAL LOW (ref 3.5–5.0)
Albumin: 2.1 g/dL — ABNORMAL LOW (ref 3.5–5.0)
Anion gap: 5 (ref 5–15)
BUN: 21 mg/dL — AB (ref 6–20)
BUN: 19 mg/dL (ref 6–20)
BUN: 20 mg/dL (ref 6–20)
BUN: 19 mg/dL (ref 6–20)
BUN: 20 mg/dL (ref 6–20)
BUN: 20 mg/dL (ref 6–20)
CALCIUM: 7.9 mg/dL — AB (ref 8.9–10.3)
CALCIUM: 7.7 mg/dL — AB (ref 8.9–10.3)
CALCIUM: 7.9 mg/dL — AB (ref 8.9–10.3)
CALCIUM: 8.3 mg/dL — AB (ref 8.9–10.3)
CHLORIDE: 105 mmol/L (ref 101–111)
CHLORIDE: 104 mmol/L (ref 101–111)
CHLORIDE: 102 mmol/L (ref 101–111)
CO2: 28 mmol/L (ref 22–32)
CO2: 27 mmol/L (ref 22–32)
CO2: 28 mmol/L (ref 22–32)
CO2: 28 mmol/L (ref 22–32)
CO2: 26 mmol/L (ref 22–32)
CO2: 26 mmol/L (ref 22–32)
CREATININE: 1.33 mg/dL — AB (ref 0.44–1.00)
CREATININE: 1.44 mg/dL — AB (ref 0.44–1.00)
CREATININE: 1.43 mg/dL — AB (ref 0.44–1.00)
Calcium: 8.2 mg/dL — ABNORMAL LOW (ref 8.9–10.3)
Calcium: 8.1 mg/dL — ABNORMAL LOW (ref 8.9–10.3)
Chloride: 106 mmol/L (ref 101–111)
Chloride: 103 mmol/L (ref 101–111)
Chloride: 106 mmol/L (ref 101–111)
Creatinine, Ser: 1.55 mg/dL — ABNORMAL HIGH (ref 0.44–1.00)
Creatinine, Ser: 1.44 mg/dL — ABNORMAL HIGH (ref 0.44–1.00)
Creatinine, Ser: 1.46 mg/dL — ABNORMAL HIGH (ref 0.44–1.00)
GFR calc Af Amer: 46 mL/min — ABNORMAL LOW (ref >60)
GFR calc Af Amer: 55 mL/min — ABNORMAL LOW (ref >60)
GFR calc Af Amer: 50 mL/min — ABNORMAL LOW (ref >60)
GFR calc Af Amer: 49 mL/min — ABNORMAL LOW (ref >60)
GFR calc non Af Amer: 39 mL/min — ABNORMAL LOW (ref >60)
GFR calc non Af Amer: 47 mL/min — ABNORMAL LOW (ref >60)
GFR calc non Af Amer: 43 mL/min — ABNORMAL LOW (ref >60)
GFR, EST AFRICAN AMERICAN: 50 mL/min — AB (ref >60)
GFR, EST AFRICAN AMERICAN: 50 mL/min — AB (ref >60)
GFR, EST NON AFRICAN AMERICAN: 43 mL/min — AB (ref >60)
GFR, EST NON AFRICAN AMERICAN: 43 mL/min — AB (ref >60)
GFR, EST NON AFRICAN AMERICAN: 42 mL/min — AB (ref >60)
GLUCOSE: 111 mg/dL — AB (ref 65–99)
GLUCOSE: 105 mg/dL — AB (ref 65–99)
GLUCOSE: 92 mg/dL (ref 65–99)
GLUCOSE: 84 mg/dL (ref 65–99)
Glucose, Bld: 95 mg/dL (ref 65–99)
Glucose, Bld: 83 mg/dL (ref 65–99)
PHOSPHORUS: 2 mg/dL — AB (ref 2.5–4.6)
PHOSPHORUS: 1.7 mg/dL — AB (ref 2.5–4.6)
POTASSIUM: 4.1 mmol/L (ref 3.5–5.1)
POTASSIUM: 3.8 mmol/L (ref 3.5–5.1)
POTASSIUM: 3.9 mmol/L (ref 3.5–5.1)
Phosphorus: 2.7 mg/dL (ref 2.5–4.6)
Phosphorus: 2.8 mg/dL (ref 2.5–4.6)
Phosphorus: 2.2 mg/dL — ABNORMAL LOW (ref 2.5–4.6)
Phosphorus: 1.9 mg/dL — ABNORMAL LOW (ref 2.5–4.6)
Potassium: 3.6 mmol/L (ref 3.5–5.1)
Potassium: 3.4 mmol/L — ABNORMAL LOW (ref 3.5–5.1)
Potassium: 3.8 mmol/L (ref 3.5–5.1)
SODIUM: 139 mmol/L (ref 135–145)
SODIUM: 138 mmol/L (ref 135–145)
SODIUM: 139 mmol/L (ref 135–145)
SODIUM: 140 mmol/L (ref 135–145)
Sodium: 139 mmol/L (ref 135–145)
Sodium: 136 mmol/L (ref 135–145)

## 2016-10-01 LAB — CBC
HCT: 29.1 % — ABNORMAL LOW (ref 35.0–47.0)
Hemoglobin: 9.7 g/dL — ABNORMAL LOW (ref 12.0–16.0)
MCH: 30.5 pg (ref 26.0–34.0)
MCHC: 33.3 g/dL (ref 32.0–36.0)
MCV: 91.7 fL (ref 80.0–100.0)
PLATELETS: 128 10*3/uL — AB (ref 150–440)
RBC: 3.17 MIL/uL — ABNORMAL LOW (ref 3.80–5.20)
RDW: 14.5 % (ref 11.5–14.5)
WBC: 7.6 10*3/uL (ref 3.6–11.0)

## 2016-10-01 LAB — PHOSPHORUS: PHOSPHORUS: 2.8 mg/dL (ref 2.5–4.6)

## 2016-10-01 LAB — MAGNESIUM: MAGNESIUM: 1.7 mg/dL (ref 1.7–2.4)

## 2016-10-01 NOTE — Progress Notes (Signed)
[facility]  ROUNDING NOTE   Subjective:  The patient remains on continuous renal replacement therapy. She has now been extubated. She will follow simple commands. Remains on 2 pressors this a.m.   Objective:  Vital signs in last 24 hours:  Temp:  [96.6 F (35.9 C)-99.1 F (37.3 C)] 97.4 F (36.3 C) (12/02 0700) Pulse Rate:  [67-129] 67 (12/02 0400) Resp:  [9-34] 16 (12/02 0700) BP: (78-128)/(43-109) 95/71 (12/02 0700) SpO2:  [91 %-100 %] 100 % (12/02 0500) Weight:  [130.4 kg (287 lb 7.7 oz)] 130.4 kg (287 lb 7.7 oz) (12/02 0500)  Weight change: -3.3 kg (-7 lb 4.4 oz) Filed Weights   09/29/16 0121 09/30/16 0500 10/01/16 0500  Weight: 128.5 kg (283 lb 4.7 oz) 133.7 kg (294 lb 12.1 oz) 130.4 kg (287 lb 7.7 oz)    Intake/Output: I/O last 3 completed shifts: In: 2862.7 [I.V.:2016.9; NG/GT:385.8; IV Piggyback:460] Out: [id number]; Other:572]   Intake/Output this shift:  Total I/O In: -  Out: 55 [Other:49]  Physical Exam: General: No acute distress  Head: Normocephalic, atraumatic. OM moist  Eyes: Anicteric  Neck: Supple, trachea midline  Lungs:  Scattered rhonchi, normal effort   Heart: S1S2 No rubs   Abdomen:  Umbilical hernia noted, BS present   Extremities: trace peripheral edema.  Neurologic: Awake, follows simple commands  Skin: No lesions  Access: L IJ temporary dialysis catheter placed [id number]    Basic Metabolic Panel:  Recent Labs Lab 09/27/16 1109  09/30/16 1015 09/30/16 1415 09/30/16 1745 09/30/16 2152 10/01/16 0201 10/01/16 0500  NA 132*  < > 137 138 138 138 139  --   K 5.9*  < > 4.0 3.8 3.7 3.6 3.6  --   CL 105  < > 104 104 105 105 106  --   CO2 16*  < > 27 28 28 27 28   --   GLUCOSE 161*  < > 115* 104* 95 96 111*  --   BUN 50*  < > 23* 22* 21* 21* 21*  --   CREATININE 3.98*  < > 1.77* 1.73* 1.82* 1.61* 1.55*  --   CALCIUM 7.2*  < > 7.8* 7.9* 7.8* 7.9* 7.9*  --   MG 2.2  --   --   --   --   --   --  1.7  PHOS 3.2  < >  2.5 2.1* 2.0*  2.0* 1.6* 2.0* 2.8  < > = values in this interval not displayed.  Liver Function Tests:  Recent Labs Lab 09/27/16 0603 09/27/16 1109  09/30/16 1015 09/30/16 1415 09/30/16 1745 09/30/16 2152 10/01/16 0201  AST 48* 52*  --   --   --   --   --   --   ALT 19 22  --   --   --   --   --   --   ALKPHOS 77 67  --   --   --   --   --   --   BILITOT 1.0 0.9  --   --   --   --   --   --   PROT 6.3* 5.7*  --   --   --   --   --   --   ALBUMIN 2.8* 2.6*  < > 1.9* 1.8* 1.9* 1.9* 1.9*  < > = values in this interval not displayed. No results for input(s): LIPASE, AMYLASE in the last 168 hours. No results for input(s): AMMONIA in the  last 168 hours.  CBC:  Recent Labs Lab 09/27/16 0603 09/27/16 1109 09/28/16 0301 09/29/16 0508 09/30/16 0607 10/01/16 0500  WBC 25.9* 16.7* 10.9 13.0* 8.6 7.6  NEUTROABS 19.5* 13.6*  --  11.1* 6.6*  --   HGB 11.9* 11.8* 11.9* 11.1* 10.1* 9.7*  HCT 35.8 35.6 35.5 32.7* 29.7* 29.1*  MCV 96.7 93.8 93.0 91.8 91.5 91.7  PLT 353 283 300 224 138* 128*    Cardiac Enzymes:  Recent Labs Lab 09/27/16 0603  TROPONINI 0.03*    BNP: Invalid input(s): POCBNP  CBG:  Recent Labs Lab 09/30/16 1636 09/30/16 2002 09/30/16 2333 10/01/16 0321 10/01/16 0747  GLUCAP 85 85 87 109* 61    Microbiology: Results for orders placed or performed during the hospital encounter of 09/27/16  Blood Culture (routine x 2)     Status: None (Preliminary result)   Collection Time: 09/27/16  6:02 AM  Result Value Ref Range Status   Specimen Description BLOOD  R AC  Final   Special Requests   Final    BOTTLES DRAWN AEROBIC AND ANAEROBIC  AER 3 ML ANA 4 ML   Culture NO GROWTH 3 DAYS  Final   Report Status PENDING  Incomplete  Blood Culture (routine x 2)     Status: None (Preliminary result)   Collection Time: 09/27/16  6:03 AM  Result Value Ref Range Status   Specimen Description BLOOD  R ARM  Final   Special Requests   Final    BOTTLES DRAWN AEROBIC  AND ANAEROBIC  AER 6 ML ANA 10 ML   Culture NO GROWTH 3 DAYS  Final   Report Status PENDING  Incomplete  Urine culture     Status: None   Collection Time: 09/27/16  9:30 AM  Result Value Ref Range Status   Specimen Description URINE, RANDOM  Final   Special Requests NONE  Final   Culture NO GROWTH Performed at [facility]   Final   Report Status 09/28/2016 FINAL  Final  MRSA PCR Screening     Status: None   Collection Time: 09/27/16 11:09 AM  Result Value Ref Range Status   MRSA by PCR NEGATIVE NEGATIVE Final    Comment:        The GeneXpert MRSA Assay (FDA approved for NASAL specimens only), is one component of a comprehensive MRSA colonization surveillance program. It is not intended to diagnose MRSA infection nor to guide or monitor treatment for MRSA infections.     Coagulation Studies: No results for input(s): LABPROT, INR in the last 72 hours.  Urinalysis: No results for input(s): COLORURINE, LABSPEC, PHURINE, GLUCOSEU, HGBUR, BILIRUBINUR, KETONESUR, PROTEINUR, UROBILINOGEN, NITRITE, LEUKOCYTESUR in the last 72 hours.  Invalid input(s): APPERANCEUR    Imaging: Dg Chest Port 1 View  Result Date: 10/01/2016 CLINICAL DATA:  Acute respiratory failure EXAM: PORTABLE CHEST 1 VIEW COMPARISON:  September 28, 2016 FINDINGS: A new right PICC line terminates near the caval atrial junction. The ET and NG tubes have been removed. The left central line is stable. No pneumothorax. Probable layering effusion and underlying atelectasis on the left. New right perihilar opacity may represent vascular crowding given decreased volumes on the right, atelectasis, or infiltrate. Recommend attention on follow-up. IMPRESSION: 1. Support apparatus as above. 2. Probable layering effusion and underlying atelectasis on the left, new in the interval. 3. New opacity in the right perihilar region described above. Recommend attention on follow-up. Electronically Signed   By: Dorise Bullion III  M.D  On: 10/01/2016 07:54     Medications:   . dexmedetomidine 0.006 mcg/kg/hr (10/01/16 0700)  . fentaNYL infusion INTRAVENOUS Stopped (09/30/16 0820)  . norepinephrine (LEVOPHED) Adult infusion 4.48 mcg/min (10/01/16 0700)  . phenylephrine (NEO-SYNEPHRINE) Adult infusion Stopped (09/30/16 0915)  . pureflow 3 each (09/30/16 2353)  . vasopressin (PITRESSIN) infusion - *FOR SHOCK* 0.04 Units/min (10/01/16 0717)   . chlorhexidine  15 mL Mouth Rinse BID  . famotidine (PEPCID) IV  20 mg Intravenous Q48H  . fentaNYL (SUBLIMAZE) injection  50 mcg Intravenous Once  . heparin  5,000 Units Subcutaneous Q8H  . hydrocortisone sod succinate (SOLU-CORTEF) inj  50 mg Intravenous Q6H  . LORazepam  1 mg Oral q morning - 10a  . mouth rinse  15 mL Mouth Rinse q12n4p  . midazolam  1 mg Intravenous Once  . OLANZapine zydis  10 mg Oral QHS  . piperacillin-tazobactam (ZOSYN)  IV  3.375 g Intravenous Q8H  . sodium chloride flush  10-40 mL Intracatheter Q12H  . sodium chloride flush  3 mL Intravenous Q12H   sodium chloride, sodium chloride, acetaminophen, fentaNYL, haloperidol lactate, heparin, LORazepam, midazolam, ondansetron (ZOFRAN) IV  Assessment/ Plan:  45 y.o. female with a PMHx of Crohn's disease, depression, diabetes mellitus type 2, GERD, hiatal hernia, hyperlipidemia, hypertension, hypothyroidism, obesity, schizophrenia, traumatic brain injury, vitamin D deficiency, who was admitted to [facility] on 09/27/2016 for evaluation of acute respiratory failure.  Pt with suspected septic shock though source not identified initially.  1.  Acute renal failure due to ATN.  2.  Acute respiratory failure. 3.  Hyperkalemia. 4.  Metabolic acidosis.  5.  Septic shock. 6.  Umbilical hernia.   Plan:  Urine output was only 773 cc over the preceding 24 hours. Therefore we will continue the patient on continuous renal replacement therapy at this time. Hopefully her kidney function will begin to improve and we will  be able to take her off of continuous renal placement therapy within the next several days. For now continue to monitor O lites closely. Respiratory failure has improved and the patient is now extubated.   LOS: 4 Dominion Kathan 12/2/201710:02 AM

## 2016-10-01 NOTE — Progress Notes (Signed)
PULMONARY / CRITICAL CARE MEDICINE   Name: [name] MRN: [id number] DOB: [birth year]    ADMISSION DATE:  09/27/2016 CONSULTATION DATE: 09/27/16  REFERRING MD:  Dr. Loney Hering  CHIEF COMPLAINT: Acute Respiratory Failure  HPI:  45 yo obese white female admitted to the ICU for septic shock?UTI with severe metabolic acidosis, acute encephalopathy and acute respiratory failure.  Intubated and on mechanical ventilation. Developed acute renal failure necessitating continuous renal replacement therapy.  SUBJECTIVE:  Extubated 09/30/2016. Awakens to voice and touch, and follow some basi ccommands. Episodic agitation with tachycardia and tachypnea. Worsening pulmonary congestion status post extubation. Pressors weaned down to norepinephrine only at 6 mcg. Zyprexa, Haldol, and lorazepam given early on in the shift without significant improvement in agitation. Started on Precedex with much improvement. Heart rate down to the 60s, SPO2 on the high 90s and mean arterial blood pressures between 70 and 80  VITAL SIGNS: BP (!) 97/58   Pulse 67   Temp 98.2 F (36.8 C) (Axillary)   Resp 10   Ht 5\' 6"  (1.676 m)   Wt 287 lb 7.7 oz (130.4 kg)   SpO2 100%   BMI 46.40 kg/m   HEMODYNAMICS: CVP:  [3 mmHg-27 mmHg] 14 mmHg  VENTILATOR SETTINGS: Vent Mode: Spontaneous FiO2 (%):  [35 %] 35 % Set Rate:  [16 bmp] 16 bmp Vt Set:  [500 mL] 500 mL PEEP:  [5 cmH20-10 cmH20] 5 cmH20 Pressure Support:  [5 cmH20] 5 cmH20  INTAKE / OUTPUT: I/O last 3 completed shifts: In: 4336.9 [I.V.:3380.1; NG/GT:456.8; IV Piggyback:500] Out: 2088.3 [Urine:1490; Emesis/NG output:100; Other:498.3]  PHYSICAL EXAMINATION: General:  Morbidly obese, sickly appearing white female Neuro:  Opens eyes to voice, follow simple commands, moves extremities HEENT:  Atraumatic, normocephalic, no discharge, no JVD appreciated Cardiovascular: Normal sinus rhythm, S1S2, RRR, No MRG noted Lungs: Normal work of breathing,  bilateral airflow with coarse bilaterally and rhonchi bilaterally, no wheezes Abdomen:  Firm, distended, active bowel sounds Musculoskeletal:  No inflammation/deformity noted Skin:  Grossly intact  LABS:  BMET  Recent Labs Lab 09/30/16 1745 09/30/16 2152 10/01/16 0201  NA 138 138 139  K 3.7 3.6 3.6  CL 105 105 106  CO2 28 27 28   BUN 21* 21* 21*  CREATININE 1.82* 1.61* 1.55*  GLUCOSE 95 96 111*    Electrolytes  Recent Labs Lab 09/27/16 1109  09/30/16 1745 09/30/16 2152 10/01/16 0201  CALCIUM 7.2*  < > 7.8* 7.9* 7.9*  MG 2.2  --   --   --   --   PHOS 3.2  < > 2.0*  2.0* 1.6* 2.0*  < > = values in this interval not displayed.  CBC  Recent Labs Lab 09/28/16 0301 09/29/16 0508 09/30/16 0607  WBC 10.9 13.0* 8.6  HGB 11.9* 11.1* 10.1*  HCT 35.5 32.7* 29.7*  PLT 300 224 138*    Coag's  Recent Labs Lab 09/27/16 1109  APTT 40*  INR 1.25    Sepsis Markers  Recent Labs Lab 09/27/16 0603 09/27/16 0908 09/27/16 1109 09/28/16 0301 09/29/16 0508  LATICACIDVEN 6.6* 4.5* 2.1*  --   --   PROCALCITON  --   --  36.65 54.54 26.25    ABG  Recent Labs Lab 09/27/16 0614 09/27/16 1109 09/28/16 0459  PHART 7.18* 7.20* 7.24*  PCO2ART 32 34 36  PO2ART 279* 104 99    Liver Enzymes  Recent Labs Lab 09/27/16 0603 09/27/16 1109  09/30/16 1745 09/30/16 2152 10/01/16 0201  AST 48* 52*  --   --   --   --  ALT 19 22  --   --   --   --   ALKPHOS 77 67  --   --   --   --   BILITOT 1.0 0.9  --   --   --   --   ALBUMIN 2.8* 2.6*  < > 1.9* 1.9* 1.9*  < > = values in this interval not displayed.  Cardiac Enzymes  Recent Labs Lab 09/27/16 0603  TROPONINI 0.03*    Glucose  Recent Labs Lab 09/30/16 0757 09/30/16 1156 09/30/16 1636 09/30/16 2002 09/30/16 2333 10/01/16 0321  GLUCAP 107* 100* 85 85 87 109*    Imaging No results found.  ASSESSMENT / PLAN:  45 yo obese white female admitted to the ICU for septic shock?UTI with severe  metabolic acidosis, acute encephalopathy and acute respiratory failure;  Status post extubation  PULMONARY A: Acute respiratory failure P:   Supplemental oxygen as needed to maintain SPO2 greater than 90 Continue Bronchodilator Continue Steroids Continue optimal pulmonary toilet and monitor respiratory status closely Chest PT every 2 hours Patient is at high risk for reintubation due to severe agitation and persistent encephalopathy.  CARDIOVASCULAR A:  Severe septic shock-blood pressure improved, pressors were weaned down to norepinephrine only P:  Continuous Telemetry Continue norepinephrine to maintain mean arterial blood pressure greater than 65 Keep MAP goal>65 CVP monitoring per ICU protocol  ECHO with normal LVEF of approximately 60%  RENAL A:   AKI possibly related to severe sepsis-creatinine improving on CRRT Hyperkalemia-resolved Hypocalcemia Hypophosphatemia P:   Nephrology following Replace electrolytes per ICU protocol Replaced 1gm of calcium CRRT per nephrology Sodium phosphate 30 mmol ordered 2. Follow up repeat labs  GASTROINTESTINAL A:   No active issues P:   OG tube discontinued, but patient is still very drowsy to undergo a bedside swallow evaluation Consider starting low-dose dextrose infusion Famotidine for GIP  HEMATOLOGIC A:   No active issues P:  Heparin for DVT prophylaxis Transfuse if Hgb<7 Follow cbc  INFECTIOUS A:   Severe septic shock P:   Continue broad spectrum antibiotics Follow cultures Monitor fever curve Follow CBC Procalcitonin-54.54>26.3  ENDOCRINE A:   No active issues  P:   Blood Glucose checks with BMP Will need low-dose dextrose to avoid hypoglycemic episodes if she is unable to resume normal oral intake today  NEUROLOGIC A:   Acute encephalopathy related to severe sepsis Severe agitation P:   RASS goal: N/A Minimize sedation Precedex infusion for agitation. Continue Zyprexa, Haldol and lorazepam  as needed   I have personally obtained a history, examined the patient, evaluated Pertinent laboratory and [employer]/imaging results, and  formulated the assessment and plan   The Patient requires high complexity decision making for assessment and support, frequent evaluation and titration of therapies, application of advanced monitoring technologies and extensive interpretation of multiple databases. Critical Care Time devoted to patient care services described in this note is 35 minutes.   Overall, patient is critically ill, prognosis is guarded.  Patient with Multiorgan failure and at high risk for cardiac arrest and death.    Corrin Parker, M.D.  [facility]  Medical Director [place] Director [facility] Cardio-Pulmonary Department

## 2016-10-01 NOTE — Progress Notes (Signed)
Patient SR/ST on cardiac monitor, 2L n/c, SATs WNL. Confused, but able to re-orient and follows commands. Discontinued Haldol IV today secondary to prolonged qTC. Precedex off since 12:00 noon, Ativan 2mg  IV x 1. Patient sister at bedside most of day, updated on patient status and POC, updated also by MDs. 200 ml UOP this shift, CRRT continues. Swallow evaluation performed today by Belenda Cruise, Speech, at this time patient not able to be cleared to take PO medications. At this time will use least sedating medications, with reduced renal clearance, in hopes patient will continue to have improved cognitive clearance.  Continues on 0.04 Vasopressin and 6 mcg Levophed, very sensitive to titration of vasopressors.

## 2016-10-01 NOTE — Progress Notes (Signed)
[facility] Progress Note Patient Name: [name] DOB: 09-21-1971 MRN: [id number]   Date of Service  10/01/2016  HPI/Events of Note  Camera check on patient postextubation at 9:45 AM approximately on 12/1. Patient sleeping on her right side. Saturating 95% on room air. Normal work of breathing.   eICU Interventions  Continuing close ICU monitoring.      Intervention Category Major Interventions: Respiratory failure - evaluation and management  Tera Partridge 10/01/2016, 12:17 AM

## 2016-10-01 NOTE — Evaluation (Signed)
Clinical/Bedside Swallow Evaluation Patient Details  Name: [name] MRN: [id number] Date of Birth: Jul 23, 1971  Today's Date: 10/01/2016 Time: SLP Start Time (ACUTE ONLY): 1555 SLP Stop Time (ACUTE ONLY): 1655 SLP Time Calculation (min) (ACUTE ONLY): 60 min  Past Medical History:  Past Medical History:  Diagnosis Date  . Abnormal mammogram, unspecified 2013   Prev. cytology,hypercellular smears without evidence of malignant cells. The cytopathologist questioned if samples truly representative. Care taken during sampling and is felt to be representative.  . Breast screening, unspecified 2013  . Broken leg    age 45  . Cellulitis   . Crohn's disease ([place]) 2013  . Depression   . Diabetes mellitus without complication (HCC)    non insulin dependent  . Early menopause   . Edema   . GERD (gastroesophageal reflux disease)   . Hiatal hernia 2013  . Hyperlipidemia   . Hypertension   . Hypothyroidism   . 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 Obesity, unspecified 2013  . Other sign and symptom in breast 2013   Right bst US,lower outer quadrant,A single 0.3x0.4x0.6cm hypoechoic mass with slightly lobulated borders with adjacent 0.3x0.4x0.5cm mass was noted. The 1st was 5cm from nipple, 2nd at 8 cm from the nipple. The previous lesion aspirate was at 3 0'clock position. These lesions are thought to account for the mammographic abnormality. Minimal interval change on [place].   [facility])   . Rib fracture    age 45  . Schizophrenia ([place])   . TBI (traumatic brain injury) ([place])   . Thyroid disease    hypothyroid  . Vitamin D deficiency    Past Surgical History:  Past Surgical History:  Procedure Laterality Date  . COLONOSCOPY WITH PROPOFOL N/A 02/16/2016   Procedure: COLONOSCOPY WITH PROPOFOL;  Surgeon: Lollie Sails, MD;  Location[facility] ENDOSCOPY;  Service: Endoscopy;  Laterality: N/A;  . COLONOSCOPY WITH PROPOFOL N/A 02/22/2016   Procedure:  COLONOSCOPY WITH PROPOFOL;  Surgeon: Lollie Sails, MD;  Location: [facility] ENDOSCOPY;  Service: Endoscopy;  Laterality: N/A;  . EYE SURGERY Left 2013   cataract surgery  . TUMOR EXCISION Right    age of 45, tumor removed from RUE   HPI:      Assessment / Plan / Recommendation Clinical Impression  Pt appears at increased risk for aspiration at this time secondary to her reduced level of alertness, increased level of confusion including oral confusion and min apraxic-like oral behavior, and her declined ability to follow instructions - pt required max verbal/tactile cues to follow along w/ the task of taking po trials. Pt would be at increased risk for an aspiration event at this time. NSG is reducing pt's sedating medications which should allow for pt to be more awake/alert to more safely participate in taking po intake. Recommend continue w/ frequent oral care while NPO; ST services will continue to f/u w/ ongoing assessment of readiness for oral intake.     Aspiration Risk  Moderate aspiration risk    Diet Recommendation  NPO w/ frequent oral care by NSG for hygiene and oral stimulation; ongoing assessment  Medication Administration: Via alternative means    Other  Recommendations Recommended Consults:  (TBD) Oral Care Recommendations: Oral care QID;Staff/trained caregiver to provide oral care   Follow up Recommendations  (TBD)      Frequency and Duration min 3x week  2 weeks       Prognosis Prognosis for Safe  Diet Advancement: Fair Barriers to Reach Goals: Cognitive deficits;Behavior;Medication      Swallow Study   General Date of Onset: 09/27/16 Type of Study: Bedside Swallow Evaluation Previous Swallow Assessment: none indicated Diet Prior to this Study: Regular;Thin liquids (per report) Temperature Spikes Noted: No (wbc 7.6) Respiratory Status: Nasal cannula (2 liters) History of Recent Intubation: Yes Length of Intubations (days): 4 days Date extubated:  09/30/16 Behavior/Cognition: Lethargic/Drowsy;Requires cueing;Doesn't follow directions (declined cognitive status/confusion; schizophrenia) Oral Cavity Assessment:  (unable to fully assess secondary to cognitive status) Oral Care Completed by SLP: Recent completion by staff Oral Cavity - Dentition: Adequate natural dentition Vision:  (n/a) Self-Feeding Abilities: Total assist Patient Positioning: Upright in bed;Postural control adequate for testing Baseline Vocal Quality: Low vocal intensity (mumbled at times) Volitional Cough: Congested (nonvolitional) Volitional Swallow: Unable to elicit    Oral/Motor/Sensory Function Overall Oral Motor/Sensory Function:  (unable to fully assess secondary to cognitive status)   Ice Chips Ice chips: Not tested   Thin Liquid Thin Liquid: Not tested    Nectar Thick Nectar Thick Liquid: Impaired Presentation: Spoon (fed; 6 trials) Oral Phase Impairments: Reduced labial seal;Reduced lingual movement/coordination;Poor awareness of bolus (appeared cognitive in nature) Oral phase functional implications: Prolonged oral transit (confusion; blowing; biting spoon) Pharyngeal Phase Impairments: Suspected delayed Swallow (no immediate, overt s/s of aspiration noted) Other Comments: pt exhibited a mild congested cough(upper airway, chesty) x2 but this appeared similar to the congested cough she had during positioning her upright in the bed for the assessment. Family stated pt had recently coughed and cleared thick phlegm earlier.    Honey Thick Honey Thick Liquid: Not tested   Puree Puree: Not tested   Solid   GO   Solid: Not tested         Orinda Kenner, MS, CCC-SLP [name] 10/01/2016,4:56 PM

## 2016-10-01 NOTE — Progress Notes (Signed)
SLP Cancellation Note  Patient Details Name: [name] MRN: [id number] DOB: [birth year]   Cancelled treatment:       Reason Eval/Treat Not Completed: Patient's level of consciousness (Scheduled another time today w/ NSG to see pt ). Pt is lethargic d/t sedating medications.    Orinda Kenner, MS, CCC-SLP [name] 10/01/2016, 11:34 AM

## 2016-10-01 NOTE — Progress Notes (Signed)
Patient has prolonged qTC at 529 per telemetry monitoring. Patient has PRN Haldol IV, which I have administered once this shift for agitation, with 2 other previous administrations. Potassium, magnesium, and Phosphorus are WNL. Per Dr. Mortimer Fries, will discontinue IV Haldol, and continue to monitor qTC.

## 2016-10-02 ENCOUNTER — Inpatient Hospital Stay: Payer: Medicare Other

## 2016-10-02 DIAGNOSIS — L8990 Pressure ulcer of unspecified site, unspecified stage: Secondary | ICD-10-CM | POA: Insufficient documentation

## 2016-10-02 LAB — RENAL FUNCTION PANEL
ALBUMIN: 1.8 g/dL — AB (ref 3.5–5.0)
ALBUMIN: 1.8 g/dL — AB (ref 3.5–5.0)
ANION GAP: 7 (ref 5–15)
Albumin: 1.8 g/dL — ABNORMAL LOW (ref 3.5–5.0)
Anion gap: 8 (ref 5–15)
Anion gap: 7 (ref 5–15)
BUN: 33 mg/dL — AB (ref 6–20)
BUN: 29 mg/dL — AB (ref 6–20)
BUN: 29 mg/dL — AB (ref 6–20)
CALCIUM: 7.7 mg/dL — AB (ref 8.9–10.3)
CALCIUM: 8.1 mg/dL — AB (ref 8.9–10.3)
CHLORIDE: 108 mmol/L (ref 101–111)
CO2: 25 mmol/L (ref 22–32)
CO2: 25 mmol/L (ref 22–32)
CO2: 26 mmol/L (ref 22–32)
CREATININE: 2.09 mg/dL — AB (ref 0.44–1.00)
CREATININE: 1.56 mg/dL — AB (ref 0.44–1.00)
Calcium: 8.1 mg/dL — ABNORMAL LOW (ref 8.9–10.3)
Chloride: 108 mmol/L (ref 101–111)
Chloride: 108 mmol/L (ref 101–111)
Creatinine, Ser: 1.77 mg/dL — ABNORMAL HIGH (ref 0.44–1.00)
GFR calc Af Amer: 32 mL/min — ABNORMAL LOW (ref >60)
GFR calc Af Amer: 39 mL/min — ABNORMAL LOW (ref >60)
GFR calc Af Amer: 45 mL/min — ABNORMAL LOW (ref >60)
GFR calc non Af Amer: 27 mL/min — ABNORMAL LOW (ref >60)
GFR, EST NON AFRICAN AMERICAN: 34 mL/min — AB (ref >60)
GFR, EST NON AFRICAN AMERICAN: 39 mL/min — AB (ref >60)
GLUCOSE: 92 mg/dL (ref 65–99)
GLUCOSE: 104 mg/dL — AB (ref 65–99)
Glucose, Bld: 114 mg/dL — ABNORMAL HIGH (ref 65–99)
PHOSPHORUS: 2.7 mg/dL (ref 2.5–4.6)
PHOSPHORUS: 2.5 mg/dL (ref 2.5–4.6)
POTASSIUM: 3.8 mmol/L (ref 3.5–5.1)
Phosphorus: 2.8 mg/dL (ref 2.5–4.6)
Potassium: 3.4 mmol/L — ABNORMAL LOW (ref 3.5–5.1)
Potassium: 3.6 mmol/L (ref 3.5–5.1)
SODIUM: 141 mmol/L (ref 135–145)
SODIUM: 140 mmol/L (ref 135–145)
Sodium: 141 mmol/L (ref 135–145)

## 2016-10-02 LAB — BASIC METABOLIC PANEL
Anion gap: 7 (ref 5–15)
BUN: 27 mg/dL — AB (ref 6–20)
CALCIUM: 7.9 mg/dL — AB (ref 8.9–10.3)
CO2: 25 mmol/L (ref 22–32)
CREATININE: 1.88 mg/dL — AB (ref 0.44–1.00)
Chloride: 108 mmol/L (ref 101–111)
GFR calc Af Amer: 36 mL/min — ABNORMAL LOW (ref >60)
GFR, EST NON AFRICAN AMERICAN: 31 mL/min — AB (ref >60)
GLUCOSE: 83 mg/dL (ref 65–99)
Potassium: 3.7 mmol/L (ref 3.5–5.1)
SODIUM: 140 mmol/L (ref 135–145)

## 2016-10-02 LAB — CBC
HEMATOCRIT: 23 % — AB (ref 35.0–47.0)
Hemoglobin: 7.8 g/dL — ABNORMAL LOW (ref 12.0–16.0)
MCH: 31.1 pg (ref 26.0–34.0)
MCHC: 33.9 g/dL (ref 32.0–36.0)
MCV: 91.7 fL (ref 80.0–100.0)
PLATELETS: 131 10*3/uL — AB (ref 150–440)
RBC: 2.5 MIL/uL — ABNORMAL LOW (ref 3.80–5.20)
RDW: 14.6 % — AB (ref 11.5–14.5)
WBC: 13.7 10*3/uL — AB (ref 3.6–11.0)

## 2016-10-02 LAB — MAGNESIUM
MAGNESIUM: 1.8 mg/dL (ref 1.7–2.4)
Magnesium: 1.7 mg/dL (ref 1.7–2.4)
Magnesium: 1.8 mg/dL (ref 1.7–2.4)
Magnesium: 1.9 mg/dL (ref 1.7–2.4)

## 2016-10-02 LAB — CULTURE, BLOOD (ROUTINE X 2)
CULTURE: NO GROWTH
Culture: NO GROWTH

## 2016-10-02 LAB — GLUCOSE, CAPILLARY
GLUCOSE-CAPILLARY: 84 mg/dL (ref 65–99)
GLUCOSE-CAPILLARY: 89 mg/dL (ref 65–99)
Glucose-Capillary: 84 mg/dL (ref 65–99)
Glucose-Capillary: 97 mg/dL (ref 65–99)
Glucose-Capillary: 108 mg/dL — ABNORMAL HIGH (ref 65–99)

## 2016-10-02 LAB — PHOSPHORUS: PHOSPHORUS: 2.5 mg/dL (ref 2.5–4.6)

## 2016-10-02 MED ORDER — FAMOTIDINE IN NACL 20-0.9 MG/50ML-% IV SOLN
20.0000 mg | INTRAVENOUS | Status: DC
  Administered 2016-10-02 – 2016-10-06 (×5): 20 mg via INTRAVENOUS
  Filled 2016-10-02 (×6): qty 50

## 2016-10-02 MED ORDER — PUREFLOW DIALYSIS SOLUTION
INTRAVENOUS | Status: DC
  Administered 2016-10-02 – 2016-10-03 (×4): 3 via INTRAVENOUS_CENTRAL
  Administered 2016-10-04 (×2): via INTRAVENOUS_CENTRAL

## 2016-10-02 MED ORDER — HEPARIN SODIUM (PORCINE) 1000 UNIT/ML DIALYSIS: 1000.0000 [IU] | INTRAMUSCULAR | Status: DC | PRN

## 2016-10-02 MED ORDER — HEPARIN SODIUM (PORCINE) 1000 UNIT/ML DIALYSIS
1000.0000 [IU] | INTRAMUSCULAR | Status: DC | PRN
  Administered 2016-10-02: 1000 [IU] via INTRAVENOUS_CENTRAL
  Administered 2016-10-04: 2800 [IU] via INTRAVENOUS_CENTRAL

## 2016-10-02 NOTE — Progress Notes (Signed)
Pharmacy Antibiotic / CRRT Dose Adjustment Note  [name] is a 45 y.o. female admitted on 09/27/2016 with sepsis from unknown source with severe metabolic acidosis and acute encephalopathy. Pharmacy has been consulted for piperacillin/tazobactam dosing and to dose adjust medications based on CRRT. CRRT began 11/29, Was stopped 12/2, and then restarted 12/3.   Plan: Antibiotics: Continue piperacillin/tazobactam 3.375 g IE q 8 hours  CRRT scheduled non antibiotics: Will adjust famotidine dose from every 48 hours to to famotidine every 24 hours while on CRRT.  Will continue to monitor CRRT and for initiation of home medications.   Height: 5\' 6"  (167.6 cm) Weight: 287 lb 7.7 oz (130.4 kg) IBW/kg (Calculated) : 59.3  Temp (24hrs), Avg:97.9 F (36.6 C), Min:97.5 F (36.4 C), Max:98.4 F (36.9 C)   Recent Labs Lab 09/27/16 0603 09/27/16 0908 09/27/16 1109 09/28/16 0301  09/29/16 0508  09/30/16 0607  10/01/16 0500 10/01/16 1002 10/01/16 1443 10/01/16 1753 10/01/16 2159 10/02/16 0506  WBC 25.9*  --  16.7* 10.9  --  13.0*  --  8.6  --  7.6  --   --   --   --  13.7*  CREATININE 4.29*  --  3.98* 4.01*  < > 2.94*  2.91*  < > 1.88*  < > 1.33* 1.44* 1.44* 1.43* 1.46* 1.88*  LATICACIDVEN 6.6* 4.5* 2.1*  --   --   --   --   --   --   --   --   --   --   --   --   < > = values in this interval not displayed.  Estimated Creatinine Clearance: 52.3 mL/min (by C-G formula based on SCr of 1.88 mg/dL (H)).    Allergies  Allergen Reactions  . Peanut Oil Other (See Comments)    Face turns red  . Risperidone And Related Cough    Antimicrobials this admission: Vancomycin 11/28 >> 12/1 Piperacillin/tazo 11/28 >>   Dose adjustments this admission:   Microbiology results: 11/28 BCx: NG x 3 days 11/28 UCx: No growth but collected after antibiotic administration 11/28 MRSA PCR: negative  Thank you for allowing pharmacy to be a part of this patient's care.  Pernell Dupre,  PharmD, BCPS Clinical Pharmacist 10/02/2016 11:47 AM

## 2016-10-02 NOTE — Progress Notes (Signed)
CRRT restarted

## 2016-10-02 NOTE — Progress Notes (Signed)
Pt. Taken off CRRT per Dr. Holley Raring and Ms. Patria Mane NP after discussion. Pt.'s BUN and Cr. Continue to trend down, UOP continues to be 10-20 mL/h. Will continue to monitor pt. Closely.

## 2016-10-02 NOTE — Progress Notes (Signed)
PULMONARY / CRITICAL CARE MEDICINE   Name: [name] MRN: [id number] DOB: Apr 23, 1971    ADMISSION DATE:  09/27/2016 CONSULTATION DATE: 09/27/16  REFERRING MD:  Dr. Loney Hering  CHIEF COMPLAINT: Acute Respiratory Failure  HPI:  45 yo obese white female admitted to the ICU for septic shock?UTI with severe metabolic acidosis, acute encephalopathy and acute respiratory failure.  Intubated and on mechanical ventilation. Developed acute renal failure necessitating continuous renal replacement therapy.  SUBJECTIVE:  Extubated 09/30/2016. Awakens to voice and touch, and follow some basi ccommands. Episodic agitation with tachycardia and tachypnea. Worsening pulmonary congestion status post extubation. Pressors weaned down to norepinephrine only at 6 mcg. Zyprexa, Haldol, and lorazepam given early on in the shift without significant improvement in agitation. Started on Precedex with much improvement. Heart rate down to the 60s, SPO2 on the high 90s and mean arterial blood pressures between 70 and 80  VITAL SIGNS: BP 113/80   Pulse (!) 109   Temp 98.2 F (36.8 C) (Axillary)   Resp (!) 22   Ht 5\' 6"  (1.676 m)   Wt 287 lb 7.7 oz (130.4 kg)   SpO2 95%   BMI 46.40 kg/m   HEMODYNAMICS: CVP:  [6 mmHg-14 mmHg] 12 mmHg  VENTILATOR SETTINGS:    INTAKE / OUTPUT: I/O last 3 completed shifts: In: 1633.8 [I.V.:1173.8; IV Piggyback:460] Out: 1882 [Urine:572; Other:1310]  PHYSICAL EXAMINATION: General:  Morbidly obese, sickly appearing white female Neuro:  Opens eyes to voice, follow simple commands, moves extremities HEENT:  Atraumatic, normocephalic, no discharge, no JVD appreciated Cardiovascular: Normal sinus rhythm, S1S2, RRR, No MRG noted Lungs: Normal work of breathing, bilateral airflow with coarse bilaterally and rhonchi bilaterally, no wheezes Abdomen:  Firm, distended, active bowel sounds Musculoskeletal:  No inflammation/deformity noted Skin:  Grossly  intact  LABS:  BMET  Recent Labs Lab 10/01/16 1753 10/01/16 2159 10/02/16 0506  NA 136 140 140  K 3.8 3.9 3.7  CL 102 106 108  CO2 26 26 25   BUN 20 20 27*  CREATININE 1.43* 1.46* 1.88*  GLUCOSE 83 84 83    Electrolytes  Recent Labs Lab 09/27/16 1109  10/01/16 0500  10/01/16 1753 10/01/16 2159 10/02/16 0506  CALCIUM 7.2*  < > 7.7*  < > 8.1* 8.3* 7.9*  MG 2.2  --  1.7  --   --   --  1.7  PHOS 3.2  < > 2.7  2.8  < > 1.9* 1.7* 2.5  < > = values in this interval not displayed.  CBC  Recent Labs Lab 09/30/16 0607 10/01/16 0500 10/02/16 0506  WBC 8.6 7.6 13.7*  HGB 10.1* 9.7* 7.8*  HCT 29.7* 29.1* 23.0*  PLT 138* 128* 131*    Coag's  Recent Labs Lab 09/27/16 1109  APTT 40*  INR 1.25    Sepsis Markers  Recent Labs Lab 09/27/16 0603 09/27/16 0908 09/27/16 1109 09/28/16 0301 09/29/16 0508  LATICACIDVEN 6.6* 4.5* 2.1*  --   --   PROCALCITON  --   --  36.65 54.54 26.25    ABG  Recent Labs Lab 09/27/16 0614 09/27/16 1109 09/28/16 0459  PHART 7.18* 7.20* 7.24*  PCO2ART 32 34 36  PO2ART 279* 104 99    Liver Enzymes  Recent Labs Lab 09/27/16 0603 09/27/16 1109  10/01/16 1443 10/01/16 1753 10/01/16 2159  AST 48* 52*  --   --   --   --   ALT 19 22  --   --   --   --  ALKPHOS 77 67  --   --   --   --   BILITOT 1.0 0.9  --   --   --   --   ALBUMIN 2.8* 2.6*  < > 2.0* 2.1* 2.1*  < > = values in this interval not displayed.  Cardiac Enzymes  Recent Labs Lab 09/27/16 0603  TROPONINI 0.03*    Glucose  Recent Labs Lab 10/01/16 1148 10/01/16 1622 10/01/16 1923 10/01/16 2228 10/02/16 0330 10/02/16 0715  GLUCAP 90 80 84 80 84 84    Imaging Dg Chest Port 1 View  Result Date: 10/02/2016 CLINICAL DATA:  45 year old female with history of acute respiratory failure. EXAM: PORTABLE CHEST 1 VIEW COMPARISON:  Chest x-ray 10/01/2016. FINDINGS: There is a right-sided internal jugular central venous catheter with tip terminating in  the distal superior vena cava. Left internal jugular Vas-Cath with tip terminating in the distal superior vena cava. Lung volumes are low. Elevated right hemidiaphragm. Bibasilar opacities are favored to reflect areas of subsegmental atelectasis. Perihilar linear densities are also most compatible with areas of subsegmental atelectasis. Possible small left pleural effusion. No evidence of pulmonary edema. Heart size is normal. Upper mediastinal contours are within normal limits. IMPRESSION: 1. Support apparatus, as above. 2. Low lung volumes with bibasilar subsegmental atelectasis and probable small left pleural effusion. Electronically Signed   By: Vinnie Langton M.D.   On: 10/02/2016 07:54    ASSESSMENT / PLAN:  45 yo obese white female admitted to the ICU for septic shock?UTI with severe metabolic acidosis, acute encephalopathy and acute respiratory failure;  Status post extubation  PULMONARY A: Acute respiratory failure P:   Supplemental oxygen as needed to maintain SPO2 greater than 90 Continue Bronchodilator Continue Steroids Continue optimal pulmonary toilet and monitor respiratory status closely Chest PT every 2 hours Patient is at high risk for reintubation due to severe agitation and persistent encephalopathy.  CARDIOVASCULAR A:  Severe septic shock-blood pressure improved, pressors were weaned down to vasopressin  only P:  Continuous Telemetry Continue norepinephrine to maintain mean arterial blood pressure greater than 65 Keep MAP goal>65 CVP monitoring per ICU protocol  ECHO with normal LVEF of approximately 60%  RENAL A:   AKI possibly related to severe sepsis-creatinine improving on CRRT Hyperkalemia-resolved Hypocalcemia Hypophosphatemia P:   Nephrology following Replace electrolytes per ICU protocol Replaced 1gm of calcium CRRT stopped, watch UO, consider HD if needed Sodium phosphate 30 mmol ordered 2. Follow up repeat labs  GASTROINTESTINAL A:   No  active issues P:   OG tube discontinued, but patient is still very drowsy to undergo a bedside swallow evaluation Consider starting low-dose dextrose infusion Famotidine for GIP  HEMATOLOGIC A:   No active issues P:  Heparin for DVT prophylaxis Transfuse if Hgb<7 Follow cbc  INFECTIOUS A:   Severe septic shock P:   Continue broad spectrum antibiotics Follow cultures Monitor fever curve Follow CBC Procalcitonin-54.54>26.3  ENDOCRINE A:   No active issues  P:   Blood Glucose checks with BMP Will need low-dose dextrose to avoid hypoglycemic episodes if she is unable to resume normal oral intake today  NEUROLOGIC A:   Acute encephalopathy related to severe sepsis Severe agitation P:   RASS goal: N/A Minimize sedation Precedex infusion for agitation. Continue Zyprexa, Haldol and lorazepam as needed   I have personally obtained a history, examined the patient, evaluated Pertinent laboratory and [employer]/imaging results, and  formulated the assessment and plan   The Patient requires high complexity decision making  for assessment and support, frequent evaluation and titration of therapies, application of advanced monitoring technologies and extensive interpretation of multiple databases. Critical Care Time devoted to patient care services described in this note is 35 minutes.   Overall, patient is critically ill, prognosis is guarded.     Corrin Parker, M.D.  [facility]  Medical Director [place] Director [facility] Cardio-Pulmonary Department

## 2016-10-02 NOTE — Progress Notes (Signed)
[facility]  ROUNDING NOTE   Subjective:  Patient was taken off of continuous renal replacement therapy yesterday. We plan to restart this today. Urine output was only 364 mL over the preceding 24 hours.   Objective:  Vital signs in last 24 hours:  Temp:  [97.5 F (36.4 C)-98.4 F (36.9 C)] 98.4 F (36.9 C) (12/03 0800) Pulse Rate:  [84-116] 109 (12/03 0800) Resp:  [11-32] 22 (12/03 0800) BP: (78-123)/(53-104) 113/80 (12/03 0800) SpO2:  [89 %-98 %] 95 % (12/03 0800) Weight:  [130.4 kg (287 lb 7.7 oz)] 130.4 kg (287 lb 7.7 oz) (12/03 0500)  Weight change: 0 kg (0 lb) Filed Weights   09/30/16 0500 10/01/16 0500 10/02/16 0500  Weight: 133.7 kg (294 lb 12.1 oz) 130.4 kg (287 lb 7.7 oz) 130.4 kg (287 lb 7.7 oz)    Intake/Output: I/O last 3 completed shifts: In: 1633.8 [I.V.:1173.8; IV Piggyback:460] Out: 1882 [Urine:572; Other:1310]   Intake/Output this shift:  Total I/O In: 43.4 [I.V.:43.4] Out: 35 [Urine:35]  Physical Exam: General: No acute distress  Head: Normocephalic, atraumatic. OM moist  Eyes: Anicteric  Neck: Supple, trachea midline  Lungs:  Scattered rhonchi, normal effort   Heart: S1S2 No rubs   Abdomen:  Umbilical hernia noted, BS present   Extremities: trace peripheral edema.  Neurologic: Awake, follows simple commands  Skin: No lesions  Access: L IJ temporary dialysis catheter placed [id number]    Basic Metabolic Panel:  Recent Labs Lab 09/27/16 1109  10/01/16 0500 10/01/16 1002 10/01/16 1443 10/01/16 1753 10/01/16 2159 10/02/16 0506  NA 132*  < > 138 139 139 136 140 140  K 5.9*  < > 3.4* 3.8 4.1 3.8 3.9 3.7  CL 105  < > 105 103 104 102 106 108  CO2 16*  < > 27 28 28 26 26 25   GLUCOSE 161*  < > 105* 95 92 83 84 83  BUN 50*  < > 19 20 19 20 20  27*  CREATININE 3.98*  < > 1.33* 1.44* 1.44* 1.43* 1.46* 1.88*  CALCIUM 7.2*  < > 7.7* 7.9* 8.2* 8.1* 8.3* 7.9*  MG 2.2  --  1.7  --   --   --   --  1.7  PHOS 3.2  < > 2.7  2.8 2.8 2.2*  1.9* 1.7* 2.5  < > = values in this interval not displayed.  Liver Function Tests:  Recent Labs Lab 09/27/16 0603 09/27/16 1109  10/01/16 0500 10/01/16 1002 10/01/16 1443 10/01/16 1753 10/01/16 2159  AST 48* 52*  --   --   --   --   --   --   ALT 19 22  --   --   --   --   --   --   ALKPHOS 77 67  --   --   --   --   --   --   BILITOT 1.0 0.9  --   --   --   --   --   --   PROT 6.3* 5.7*  --   --   --   --   --   --   ALBUMIN 2.8* 2.6*  < > 1.7* 1.9* 2.0* 2.1* 2.1*  < > = values in this interval not displayed. No results for input(s): LIPASE, AMYLASE in the last 168 hours. No results for input(s): AMMONIA in the last 168 hours.  CBC:  Recent Labs Lab 09/27/16 0603 09/27/16 1109 09/28/16 0301 09/29/16 0508 09/30/16  [id number] 10/01/16 0500 10/02/16 0506  WBC 25.9* 16.7* 10.9 13.0* 8.6 7.6 13.7*  NEUTROABS 19.5* 13.6*  --  11.1* 6.6*  --   --   HGB 11.9* 11.8* 11.9* 11.1* 10.1* 9.7* 7.8*  HCT 35.8 35.6 35.5 32.7* 29.7* 29.1* 23.0*  MCV 96.7 93.8 93.0 91.8 91.5 91.7 91.7  PLT 353 283 300 224 138* 128* 131*    Cardiac Enzymes:  Recent Labs Lab 09/27/16 0603  TROPONINI 0.03*    BNP: Invalid input(s): POCBNP  CBG:  Recent Labs Lab 10/01/16 1622 10/01/16 1923 10/01/16 2228 10/02/16 0330 10/02/16 0715  GLUCAP 80 84 80 84 84    Microbiology: Results for orders placed or performed during the hospital encounter of 09/27/16  Blood Culture (routine x 2)     Status: None   Collection Time: 09/27/16  6:02 AM  Result Value Ref Range Status   Specimen Description BLOOD  R AC  Final   Special Requests   Final    BOTTLES DRAWN AEROBIC AND ANAEROBIC  AER 3 ML ANA 4 ML   Culture NO GROWTH 5 DAYS  Final   Report Status 10/02/2016 FINAL  Final  Blood Culture (routine x 2)     Status: None   Collection Time: 09/27/16  6:03 AM  Result Value Ref Range Status   Specimen Description BLOOD  R ARM  Final   Special Requests   Final    BOTTLES DRAWN AEROBIC AND ANAEROBIC   AER 6 ML ANA 10 ML   Culture NO GROWTH 5 DAYS  Final   Report Status 10/02/2016 FINAL  Final  Urine culture     Status: None   Collection Time: 09/27/16  9:30 AM  Result Value Ref Range Status   Specimen Description URINE, RANDOM  Final   Special Requests NONE  Final   Culture NO GROWTH Performed at [facility]   Final   Report Status 09/28/2016 FINAL  Final  MRSA PCR Screening     Status: None   Collection Time: 09/27/16 11:09 AM  Result Value Ref Range Status   MRSA by PCR NEGATIVE NEGATIVE Final    Comment:        The GeneXpert MRSA Assay (FDA approved for NASAL specimens only), is one component of a comprehensive MRSA colonization surveillance program. It is not intended to diagnose MRSA infection nor to guide or monitor treatment for MRSA infections.     Coagulation Studies: No results for input(s): LABPROT, INR in the last 72 hours.  Urinalysis: No results for input(s): COLORURINE, LABSPEC, PHURINE, GLUCOSEU, HGBUR, BILIRUBINUR, KETONESUR, PROTEINUR, UROBILINOGEN, NITRITE, LEUKOCYTESUR in the last 72 hours.  Invalid input(s): APPERANCEUR    Imaging: Dg Chest Port 1 View  Result Date: 10/02/2016 CLINICAL DATA:  45 year old female with history of acute respiratory failure. EXAM: PORTABLE CHEST 1 VIEW COMPARISON:  Chest x-ray 10/01/2016. FINDINGS: There is a right-sided internal jugular central venous catheter with tip terminating in the distal superior vena cava. Left internal jugular Vas-Cath with tip terminating in the distal superior vena cava. Lung volumes are low. Elevated right hemidiaphragm. Bibasilar opacities are favored to reflect areas of subsegmental atelectasis. Perihilar linear densities are also most compatible with areas of subsegmental atelectasis. Possible small left pleural effusion. No evidence of pulmonary edema. Heart size is normal. Upper mediastinal contours are within normal limits. IMPRESSION: 1. Support apparatus, as above. 2. Low lung  volumes with bibasilar subsegmental atelectasis and probable small left pleural effusion. Electronically Signed   By: Quillian Quince  Entrikin M.D.   On: 10/02/2016 07:54   Dg Chest Port 1 View  Result Date: 10/01/2016 CLINICAL DATA:  Acute respiratory failure EXAM: PORTABLE CHEST 1 VIEW COMPARISON:  September 28, 2016 FINDINGS: A new right PICC line terminates near the caval atrial junction. The ET and NG tubes have been removed. The left central line is stable. No pneumothorax. Probable layering effusion and underlying atelectasis on the left. New right perihilar opacity may represent vascular crowding given decreased volumes on the right, atelectasis, or infiltrate. Recommend attention on follow-up. IMPRESSION: 1. Support apparatus as above. 2. Probable layering effusion and underlying atelectasis on the left, new in the interval. 3. New opacity in the right perihilar region described above. Recommend attention on follow-up. Electronically Signed   By: Dorise Bullion III M.D   On: 10/01/2016 07:54     Medications:   . dexmedetomidine 0.2 mcg/kg/hr (10/02/16 0800)  . fentaNYL infusion INTRAVENOUS Stopped (09/30/16 0820)  . norepinephrine (LEVOPHED) Adult infusion Stopped (10/02/16 0030)  . phenylephrine (NEO-SYNEPHRINE) Adult infusion Stopped (09/30/16 0915)  . pureflow Stopped (10/01/16 2231)  . vasopressin (PITRESSIN) infusion - *FOR SHOCK* Stopped (10/02/16 [id number])   . chlorhexidine  15 mL Mouth Rinse BID  . famotidine (PEPCID) IV  20 mg Intravenous Q48H  . fentaNYL (SUBLIMAZE) injection  50 mcg Intravenous Once  . heparin  5,000 Units Subcutaneous Q8H  . hydrocortisone sod succinate (SOLU-CORTEF) inj  50 mg Intravenous Q6H  . LORazepam  1 mg Oral q morning - 10a  . mouth rinse  15 mL Mouth Rinse q12n4p  . midazolam  1 mg Intravenous Once  . OLANZapine zydis  10 mg Oral QHS  . piperacillin-tazobactam (ZOSYN)  IV  3.375 g Intravenous Q8H  . sodium chloride flush  10-40 mL Intracatheter Q12H  .  sodium chloride flush  3 mL Intravenous Q12H   sodium chloride, sodium chloride, acetaminophen, fentaNYL, heparin, LORazepam, midazolam, ondansetron (ZOFRAN) IV  Assessment/ Plan:  45 y.o. female with a PMHx of Crohn's disease, depression, diabetes mellitus type 2, GERD, hiatal hernia, hyperlipidemia, hypertension, hypothyroidism, obesity, schizophrenia, traumatic brain injury, vitamin D deficiency, who was admitted to [facility] on 09/27/2016 for evaluation of acute respiratory failure.  Pt with suspected septic shock though source not identified initially.  1.  Acute renal failure due to ATN.  2.  Acute respiratory failure. 3.  Hyperkalemia. 4.  Metabolic acidosis.  5.  Septic shock. 6.  Umbilical hernia.   Plan:  CRRT was stopped late yesterday temporarily as there wasn't enough nursing support to run 3 patients on CRRT.  Enough nursing support available now.  We will restart CRRT this AM.  Consider transitioning patient to intermittent hemodialysis tomorrow as pressors are being weaned. She appears to be doing better overall as compared to admission as she has now been extubated. Overall still quite weak. Electrolytes acceptable. Acidosis has also improved. Continue to monitor.   LOS: 5 Mahmood Boehringer 12/3/201710:07 AM

## 2016-10-03 DIAGNOSIS — R410 Disorientation, unspecified: Secondary | ICD-10-CM

## 2016-10-03 LAB — RENAL FUNCTION PANEL
ALBUMIN: 1.9 g/dL — AB (ref 3.5–5.0)
ALBUMIN: 2 g/dL — AB (ref 3.5–5.0)
ANION GAP: 6 (ref 5–15)
ANION GAP: 8 (ref 5–15)
ANION GAP: 8 (ref 5–15)
Albumin: 2 g/dL — ABNORMAL LOW (ref 3.5–5.0)
Albumin: 1.9 g/dL — ABNORMAL LOW (ref 3.5–5.0)
Albumin: 2.1 g/dL — ABNORMAL LOW (ref 3.5–5.0)
Albumin: 2.1 g/dL — ABNORMAL LOW (ref 3.5–5.0)
Anion gap: 7 (ref 5–15)
Anion gap: 8 (ref 5–15)
Anion gap: 7 (ref 5–15)
BUN: 21 mg/dL — AB (ref 6–20)
BUN: 25 mg/dL — ABNORMAL HIGH (ref 6–20)
BUN: 25 mg/dL — ABNORMAL HIGH (ref 6–20)
BUN: 27 mg/dL — ABNORMAL HIGH (ref 6–20)
BUN: 27 mg/dL — ABNORMAL HIGH (ref 6–20)
BUN: 30 mg/dL — AB (ref 6–20)
CALCIUM: 7.9 mg/dL — AB (ref 8.9–10.3)
CALCIUM: 8.3 mg/dL — AB (ref 8.9–10.3)
CALCIUM: 8.3 mg/dL — AB (ref 8.9–10.3)
CHLORIDE: 109 mmol/L (ref 101–111)
CHLORIDE: 108 mmol/L (ref 101–111)
CO2: 25 mmol/L (ref 22–32)
CO2: 25 mmol/L (ref 22–32)
CO2: 26 mmol/L (ref 22–32)
CO2: 26 mmol/L (ref 22–32)
CO2: 26 mmol/L (ref 22–32)
CO2: 27 mmol/L (ref 22–32)
CREATININE: 1.56 mg/dL — AB (ref 0.44–1.00)
Calcium: 7.9 mg/dL — ABNORMAL LOW (ref 8.9–10.3)
Calcium: 8 mg/dL — ABNORMAL LOW (ref 8.9–10.3)
Calcium: 8.4 mg/dL — ABNORMAL LOW (ref 8.9–10.3)
Chloride: 108 mmol/L (ref 101–111)
Chloride: 109 mmol/L (ref 101–111)
Chloride: 107 mmol/L (ref 101–111)
Chloride: 108 mmol/L (ref 101–111)
Creatinine, Ser: 1.81 mg/dL — ABNORMAL HIGH (ref 0.44–1.00)
Creatinine, Ser: 1.58 mg/dL — ABNORMAL HIGH (ref 0.44–1.00)
Creatinine, Ser: 1.45 mg/dL — ABNORMAL HIGH (ref 0.44–1.00)
Creatinine, Ser: 1.4 mg/dL — ABNORMAL HIGH (ref 0.44–1.00)
Creatinine, Ser: 1.22 mg/dL — ABNORMAL HIGH (ref 0.44–1.00)
GFR calc Af Amer: 38 mL/min — ABNORMAL LOW (ref >60)
GFR calc Af Amer: 52 mL/min — ABNORMAL LOW (ref >60)
GFR calc Af Amer: 45 mL/min — ABNORMAL LOW (ref >60)
GFR calc Af Amer: >60 mL/min (ref >60)
GFR calc non Af Amer: 33 mL/min — ABNORMAL LOW (ref >60)
GFR calc non Af Amer: 45 mL/min — ABNORMAL LOW (ref >60)
GFR, EST AFRICAN AMERICAN: 45 mL/min — AB (ref >60)
GFR, EST AFRICAN AMERICAN: 50 mL/min — AB (ref >60)
GFR, EST NON AFRICAN AMERICAN: 39 mL/min — AB (ref >60)
GFR, EST NON AFRICAN AMERICAN: 43 mL/min — AB (ref >60)
GFR, EST NON AFRICAN AMERICAN: 39 mL/min — AB (ref >60)
GFR, EST NON AFRICAN AMERICAN: 53 mL/min — AB (ref >60)
GLUCOSE: 106 mg/dL — AB (ref 65–99)
GLUCOSE: 98 mg/dL (ref 65–99)
Glucose, Bld: 105 mg/dL — ABNORMAL HIGH (ref 65–99)
Glucose, Bld: 98 mg/dL (ref 65–99)
Glucose, Bld: 83 mg/dL (ref 65–99)
Glucose, Bld: 104 mg/dL — ABNORMAL HIGH (ref 65–99)
PHOSPHORUS: 2.6 mg/dL (ref 2.5–4.6)
PHOSPHORUS: 2 mg/dL — AB (ref 2.5–4.6)
POTASSIUM: 3.5 mmol/L (ref 3.5–5.1)
POTASSIUM: 3.6 mmol/L (ref 3.5–5.1)
POTASSIUM: 3.6 mmol/L (ref 3.5–5.1)
Phosphorus: 2.5 mg/dL (ref 2.5–4.6)
Phosphorus: 2.7 mg/dL (ref 2.5–4.6)
Phosphorus: 1.4 mg/dL — ABNORMAL LOW (ref 2.5–4.6)
Phosphorus: 1.8 mg/dL — ABNORMAL LOW (ref 2.5–4.6)
Potassium: 3.8 mmol/L (ref 3.5–5.1)
Potassium: 3.9 mmol/L (ref 3.5–5.1)
Potassium: 3.5 mmol/L (ref 3.5–5.1)
SODIUM: 141 mmol/L (ref 135–145)
SODIUM: 141 mmol/L (ref 135–145)
SODIUM: 141 mmol/L (ref 135–145)
SODIUM: 142 mmol/L (ref 135–145)
Sodium: 141 mmol/L (ref 135–145)
Sodium: 142 mmol/L (ref 135–145)

## 2016-10-03 LAB — GLUCOSE, CAPILLARY
GLUCOSE-CAPILLARY: 102 mg/dL — AB (ref 65–99)
GLUCOSE-CAPILLARY: 106 mg/dL — AB (ref 65–99)
GLUCOSE-CAPILLARY: 133 mg/dL — AB (ref 65–99)
GLUCOSE-CAPILLARY: 77 mg/dL (ref 65–99)
GLUCOSE-CAPILLARY: 91 mg/dL (ref 65–99)
GLUCOSE-CAPILLARY: 98 mg/dL (ref 65–99)
Glucose-Capillary: 99 mg/dL (ref 65–99)

## 2016-10-03 LAB — CBC
HCT: 27.4 % — ABNORMAL LOW (ref 35.0–47.0)
HEMOGLOBIN: 9.1 g/dL — AB (ref 12.0–16.0)
MCH: 30.5 pg (ref 26.0–34.0)
MCHC: 33.2 g/dL (ref 32.0–36.0)
MCV: 91.8 fL (ref 80.0–100.0)
Platelets: 149 10*3/uL — ABNORMAL LOW (ref 150–440)
RBC: 2.98 MIL/uL — ABNORMAL LOW (ref 3.80–5.20)
RDW: 14.6 % — ABNORMAL HIGH (ref 11.5–14.5)
WBC: 25.1 10*3/uL — AB (ref 3.6–11.0)

## 2016-10-03 LAB — MAGNESIUM
MAGNESIUM: 1.7 mg/dL (ref 1.7–2.4)
MAGNESIUM: 1.8 mg/dL (ref 1.7–2.4)
MAGNESIUM: 1.8 mg/dL (ref 1.7–2.4)
Magnesium: 1.8 mg/dL (ref 1.7–2.4)
Magnesium: 1.7 mg/dL (ref 1.7–2.4)
Magnesium: 1.8 mg/dL (ref 1.7–2.4)

## 2016-10-03 LAB — APTT: aPTT: 38 seconds — ABNORMAL HIGH (ref 24–36)

## 2016-10-03 MED ORDER — CLOZAPINE 100 MG PO TABS
200.0000 mg | ORAL_TABLET | Freq: Every day | ORAL | Status: DC
  Filled 2016-10-03: qty 2

## 2016-10-03 MED ORDER — HYDROCORTISONE NA SUCCINATE PF 100 MG IJ SOLR
25.0000 mg | Freq: Three times a day (TID) | INTRAMUSCULAR | Status: DC
  Administered 2016-10-03 – 2016-10-04 (×3): 25 mg via INTRAVENOUS
  Filled 2016-10-03 (×3): qty 2

## 2016-10-03 MED ORDER — ALBUMIN HUMAN 25 % IV SOLN
12.5000 g | Freq: Three times a day (TID) | INTRAVENOUS | Status: AC
  Administered 2016-10-03 – 2016-10-06 (×9): 12.5 g via INTRAVENOUS
  Filled 2016-10-03 (×9): qty 50

## 2016-10-03 MED ORDER — CITALOPRAM HYDROBROMIDE 20 MG PO TABS
40.0000 mg | ORAL_TABLET | Freq: Every day | ORAL | Status: DC
  Administered 2016-10-04 – 2016-10-18 (×15): 40 mg via ORAL
  Filled 2016-10-03 (×15): qty 2

## 2016-10-03 NOTE — Care Management (Addendum)
Extubated 12/1. Chest PT q 2 h.  Albumin IV starting today.  Weaning vasopressin.  Crrt. Precedex had to be restarted.  She is at high risk for reintubation due to her agitation

## 2016-10-03 NOTE — Progress Notes (Signed)
Pt very agitated this morning. Pt rolling around in the bed stating "get me out of the car!". Pt's movement is causing concerns for the integrity of lines in place. Precidex gtt increased and pt given PRN ativan. Pt appears calm at this time, will continue to monitor and assess needs for further PRN's.

## 2016-10-03 NOTE — Progress Notes (Signed)
Pt. Became very agitated around 0000, requiring ativan PRN & precedex gtt restarted- able to wean down to 0.4 with pt. Calm and redirectable, alert to self. Pt. CRRT stopped between 2340 and 0027 due to clots in filter, no issues since 0030. Pt. BP stable attempted x 2 to wean off vaso gtt but BP dropped below MAP of 65 with SBP in high 70's. CVP's stable btwn 8-10, UOP 10-15/hr.  Pt. Awoke this AM asking for food, main goal to optimize pt.'s chances of passing swallow eval. today.

## 2016-10-03 NOTE — Progress Notes (Signed)
0000: Pt. Became agitated, disoriented loudly stating, "don't touch me!" and began removing clothing, monitoring lines and attempting to remove peripheral IV's.  Redirected pt. Calmly, increased precedex gtt and gave ativan PRN (see EMR for details). Alerted Ms. Tukov NP to status. 0100: Pt. Still having active hallucinations, but no longer attempting to remove clothing and equipment. Will continue to monitor pt. Closely.

## 2016-10-03 NOTE — Progress Notes (Signed)
CRRT restarted, will continue to monitor pt. closely

## 2016-10-03 NOTE — Progress Notes (Signed)
PULMONARY / CRITICAL CARE MEDICINE   Name: TALAYIA GUNION MRN: [id number] DOB: 01-Jun-1971    ADMISSION DATE:  09/27/2016   ASSESSMENT / PLAN:  45 yo obese white female admitted to the ICU for septic shock?UTI with severe metabolic acidosis, acute encephalopathy and acute respiratory failure;  Status post extubation  PULMONARY A: Acute respiratory failure--improved. Extubated 12/1 P:   Supplemental oxygen as needed to maintain SPO2 greater than 90 Continue Bronchodilator Decrease Steroids Continue optimal pulmonary toilet and monitor respiratory status closely Chest PT every 2 hours Patient is at high risk for reintubation due to severe agitation and persistent encephalopathy.  CARDIOVASCULAR A:  Severe septic shock-blood pressure improved, pressors were weaned down to vasopressin  only P:  Continuous Telemetry Continue to wean pressors. Keep MAP goal>65 CVP monitoring per ICU protocol  ECHO with normal LVEF of approximately 60%  RENAL A:   AKI possibly related to severe sepsis-creatinine improving on CRRT Hyperkalemia-resolved Hypocalcemia Hypophosphatemia P:   Nephrology following Replace electrolytes per ICU protocol CRRT per nephro.  Follow up repeat labs  GASTROINTESTINAL A:   No active issues P:   OG tube discontinued, but patient is still very drowsy to undergo a bedside swallow evaluation Famotidine for GIP  HEMATOLOGIC A:   No active issues P:  Heparin for DVT prophylaxis Transfuse if Hgb<7 Follow cbc  INFECTIOUS A:   Severe septic shock P:   Continue broad spectrum antibiotics Follow cultures Monitor fever curve Follow CBC Procalcitonin-54.54>26.3  MICRO 11/28 MRSA negative.  11/28 Urine Cx; negative.  11/28 Blood Cx; negative.  ANTIBIOTICS: Vancomycin 11/28 >> 12/1 Piperacillin/tazo 11/28 >>   ENDOCRINE A:   No active issues, glucose controlled.  P:   Blood Glucose checks with BMP Will need low-dose dextrose to avoid  hypoglycemic episodes if she is unable to resume normal oral intake today  NEUROLOGIC A:   Acute encephalopathy related to severe sepsis Severe agitation P:   RASS goal: N/A Minimize sedation Precedex infusion for agitation. Continue Zyprexa, Haldol and lorazepam as needed  SUBJECTIVE:  Extubated 09/30/2016. Awakens to voice and touch, and follow some basi commands. Current on precedex.   VITAL SIGNS: BP 105/74   Pulse (!) 57   Temp (!) 96.4 F (35.8 C) (Axillary)   Resp 13   Ht 5\' 6"  (1.676 m)   Wt 295 lb 6.7 oz (134 kg)   SpO2 97%   BMI 47.68 kg/m   HEMODYNAMICS: CVP:  [7 mmHg-15 mmHg] 8 mmHg      INTAKE / OUTPUT: I/O last 3 completed shifts: In: 1516.3 [I.V.:1166.3; IV Piggyback:350] Out: 2102 [Urine:437; [id number]  PHYSICAL EXAMINATION: General:  Morbidly obese, sickly appearing white female Neuro:  Opens eyes to voice, follow simple commands, moves extremities HEENT:  Atraumatic, normocephalic, no discharge, no JVD appreciated Cardiovascular: Normal sinus rhythm, S1S2, RRR, No MRG noted Lungs: Normal work of breathing, bilateral airflow with coarse bilaterally and rhonchi bilaterally, no wheezes Abdomen:  Firm, distended, active bowel sounds Musculoskeletal:  No inflammation/deformity noted Skin:  Grossly intact  LABS:  BMET  Recent Labs Lab 10/02/16 1932 10/03/16 0001 10/03/16 0406  NA 141 141 141  K 3.8 3.6 3.5  CL 108 108 109  CO2 26 25 25   BUN 29* 30* 27*  CREATININE 1.56* 1.81* 1.58*  GLUCOSE 114* 98 105*    Electrolytes  Recent Labs Lab 10/02/16 1932 10/03/16 0001 10/03/16 0406  CALCIUM 8.1* 7.9* 8.0*  MG 1.9 1.8 1.8  PHOS 2.8 2.5 2.7  CBC  Recent Labs Lab 10/01/16 0500 10/02/16 0506 10/03/16 0406  WBC 7.6 13.7* 25.1*  HGB 9.7* 7.8* 9.1*  HCT 29.1* 23.0* 27.4*  PLT 128* 131* 149*    Coag's  Recent Labs Lab 09/27/16 1109 10/03/16 0406  APTT 40* 38*  INR 1.25  --     Sepsis Markers  Recent Labs Lab  09/27/16 0603 09/27/16 0908 09/27/16 1109 09/28/16 0301 09/29/16 0508  LATICACIDVEN 6.6* 4.5* 2.1*  --   --   PROCALCITON  --   --  36.65 54.54 26.25    ABG  Recent Labs Lab 09/27/16 0614 09/27/16 1109 09/28/16 0459  PHART 7.18* 7.20* 7.24*  PCO2ART 32 34 36  PO2ART 279* 104 99    Liver Enzymes  Recent Labs Lab 09/27/16 0603 09/27/16 1109  10/02/16 1932 10/03/16 0001 10/03/16 0406  AST 48* 52*  --   --   --   --   ALT 19 22  --   --   --   --   ALKPHOS 77 67  --   --   --   --   BILITOT 1.0 0.9  --   --   --   --   ALBUMIN 2.8* 2.6*  < > 1.8* 2.0* 1.9*  < > = values in this interval not displayed.  Cardiac Enzymes  Recent Labs Lab 09/27/16 0603  TROPONINI 0.03*    Glucose  Recent Labs Lab 10/02/16 0715 10/02/16 1159 10/02/16 1542 10/02/16 2011 10/03/16 0026 10/03/16 0416  GLUCAP 84 89 97 108* 106* 102*    Imaging No results found.  -Marda Stalker, M.D.   Critical Care Attestation.  I have personally obtained a history, examined the patient, evaluated laboratory and imaging results, formulated the assessment and plan and placed orders. The Patient requires high complexity decision making for assessment and support, frequent evaluation and titration of therapies, application of advanced monitoring technologies and extensive interpretation of multiple databases. The patient has critical illness that could lead imminently to failure of 1 or more organ systems and requires the highest level of physician preparedness to intervene.  Critical Care Time devoted to patient care services described in this note is 35 minutes and is exclusive of time spent in procedures.

## 2016-10-03 NOTE — Progress Notes (Signed)
Pharmacy Antibiotic / CRRT Dose Adjustment Note  [name] is a 45 y.o. female admitted on 09/27/2016 with sepsis from unknown source with severe metabolic acidosis and acute encephalopathy. Pharmacy has been consulted for piperacillin/tazobactam dosing and to dose adjust medications based on CRRT. CRRT began 11/29, Was stopped 12/2, and then restarted 12/3.   Plan: Antibiotics: Continue piperacillin/tazobactam 3.375 g IE q 8 hours  CRRT scheduled non antibiotics: Will continue famotidine every 24 hours while on CRRT.  Will continue to monitor CRRT and for initiation of home medications.   Height: 5\' 6"  (167.6 cm) Weight: 295 lb 6.7 oz (134 kg) IBW/kg (Calculated) : 59.3  Temp (24hrs), Avg:97.2 F (36.2 C), Min:96.1 F (35.6 C), Max:98.4 F (36.9 C)   Recent Labs Lab 09/27/16 0603 09/27/16 0908 09/27/16 1109  09/29/16 0508  09/30/16 0607  10/01/16 0500  10/02/16 0506 10/02/16 1250 10/02/16 1600 10/02/16 1932 10/03/16 0001 10/03/16 0406  WBC 25.9*  --  16.7*  < > 13.0*  --  8.6  --  7.6  --  13.7*  --   --   --   --  25.1*  CREATININE 4.29*  --  3.98*  < > 2.94*  2.91*  < > 1.88*  < > 1.33*  < > 1.88* 2.09* 1.77* 1.56* 1.81* 1.58*  LATICACIDVEN 6.6* 4.5* 2.1*  --   --   --   --   --   --   --   --   --   --   --   --   --   < > = values in this interval not displayed.  Estimated Creatinine Clearance: 63.3 mL/min (by C-G formula based on SCr of 1.58 mg/dL (H)).    Allergies  Allergen Reactions  . Peanut Oil Other (See Comments)    Face turns red  . Risperidone And Related Cough    Antimicrobials this admission: Vancomycin 11/28 >> 12/1 Piperacillin/tazo 11/28 >>   Dose adjustments this admission:   Microbiology results: 11/28 BCx: NG x 5 days 11/28 UCx: No growth but collected after antibiotic administration 11/28 MRSA PCR: negative  Thank you for allowing pharmacy to be a part of this patient's care.  Larene Beach, PharmD  Clinical  Pharmacist 10/03/2016 7:51 AM

## 2016-10-03 NOTE — Progress Notes (Signed)
Chaplain rounded the unit to provide a compassionate presence and support to the patient. Patient appeared to be sleeping.  No visitors were at the bedside. Chaplain provided silent prayer. Minerva Fester [phone]

## 2016-10-03 NOTE — Progress Notes (Signed)
Speech Language Pathology Treatment: Dysphagia  Patient Details Name: [name] MRN: [id number] DOB: 1970/11/29 Today's Date: 10/03/2016 Time: 1200-1300 SLP Time Calculation (min) (ACUTE ONLY): 60 min  Assessment / Plan / Recommendation Clinical Impression  Pt appeared to adequately tolerate TSP trials of Honey consistency liquids and Purees w/ no immediate, overt s/s of aspiration noted - no decline in Pulmonary status or change in vocal quality between/post trials. Oral phase c/b quick, rapid swallowing and rest breaks given b/t trials(noted pt's lingual searching behavior for more however).  Due to pt's declined Cognitive status and increased agitated behavior requiring sedating medications, pt is at increased risk for aspiration. MD agreed w/ attempt to initiate and Dysphagia I w/ Honey liquids diet w/ strict aspiration precautions when pt is calm and able to be off of sedating medications to participate in eating/drinking tasks. NSG present during session and agreed. NSG to address oral medication w/ MD. ST services will continue to f/u w/ pt's status and upgrade diet consistency when appropriate. Dietician following.    HPI        SLP Plan  Continue with current plan of care;Goals updated     Recommendations  Diet recommendations: Dysphagia 1 (puree);Honey-thick liquid Liquids provided via: Teaspoon;No straw Medication Administration: Crushed with puree (as able) Supervision: Full supervision/cueing for compensatory strategies;Trained caregiver to feed patient Compensations: Minimize environmental distractions;Slow rate;Small sips/bites;Lingual sweep for clearance of pocketing;Multiple dry swallows after each bite/sip;Follow solids with liquid Postural Changes and/or Swallow Maneuvers: Seated upright 90 degrees;Upright 30-60 min after meal                General recommendations: Rehab consult Oral Care Recommendations: Oral care BID;Staff/trained caregiver to provide oral  care Follow up Recommendations: Skilled Nursing facility (TBD) Plan: Continue with current plan of care;Goals updated       [place], [place], CCC-SLP Watson,Katherine 10/03/2016, 3:02 PM

## 2016-10-03 NOTE — Progress Notes (Signed)
Pt became extremely agitated and began to pull at lines, remove equipment, and roll around in the bed. Precidex gtt re-started and pt given PRN ativan. Will continue to monitor for signs of agitation and treat as needed.

## 2016-10-03 NOTE — Progress Notes (Signed)
Nutrition Follow-up  DOCUMENTATION CODES:   Morbid obesity  INTERVENTION:  -Supplement diet with Magic Cup and Honey Thick Mighty Shakes -Feeding assistance at meal times as needed  NUTRITION DIAGNOSIS:   Inadequate oral intake related to acute illness as evidenced by NPO status.  Continues but being addressed as plan in place for nutirition  GOAL:   Patient will meet greater than or equal to 90% of their needs  MONITOR:   Labs, Weight trends, I & O's (Nutrition poc)  REASON FOR ASSESSMENT:   Ventilator    ASSESSMENT:   45 yo female admitted with septic shock with severe metabolic acidosis, acute encephalopathy and acute respiratory failure  Pt did not pass swallow evaluation on Saturday. No nutrition since 12/1. Dysphagia I, Honey Thick Diet ordered after SLP eval today Pt remains on CRRT Pt gets agitated easily, on precedex infusion, receiving ativan  Labs: albumin 1.9, corrected calcium 9.6 Meds: reviewed  Diet Order:  Diet NPO time specified  Skin:   (stage I pressure ulcers)  Last BM:  12/3  Height:   Ht Readings from Last 1 Encounters:  09/27/16 5\' 6"  (1.676 m)    Weight:   Wt Readings from Last 1 Encounters:  10/03/16 295 lb 6.7 oz (134 kg)    Filed Weights   10/01/16 0500 10/02/16 0500 10/03/16 0420  Weight: 287 lb 7.7 oz (130.4 kg) 287 lb 7.7 oz (130.4 kg) 295 lb 6.7 oz (134 kg)    BMI:  Body mass index is 47.68 kg/m.  Estimated Nutritional Needs:   Kcal:  2200-2500 kcals  Protein:  >/= 120 g  Fluid:  >/= 1.7 L  EDUCATION NEEDS:   No education needs identified at this time  [place], [place], [place] [phone] Pager  [phone] Weekend/On-Call Pager

## 2016-10-03 NOTE — Progress Notes (Addendum)
Pt.'s CRRT clotted, attempted emergency rinse back/when that didn't work attempted manual rinse back. Unable to rinse back entire amount of blood.

## 2016-10-03 NOTE — Consult Note (Signed)
[facility] Consult   Reason for Consult:  Consult for 45 year old woman with a history of schizophrenia currently in the intensive care unit with sepsis Referring Physician:  Mortimer Fries Patient Identification: [name] MRN:  [id number] Principal Diagnosis: Schizophrenia Diagnosis:   Patient Active Problem List   Diagnosis Date Noted  . Pressure injury of skin [L89.90] 10/02/2016  . Schizophrenia ([place]) [F20.9] 09/30/2016  . Acute renal failure (ARF) ([place]) [N17.9]   . Respiratory failure ([place]) [J96.90] 09/27/2016  . Other sign and symptom in breast [N64.59]     Total Time spent with patient: 30 minutes  Subjective:   [name] is a 45 y.o. female patient admitted with patient was not able to give any history currently because of medical condition.  Follow-up for this 45 year old woman with a history of schizophrenia recovering from sepsis. Spoke with speech therapy and nursing. Patient seen. Patient is still having intermittent bouts of agitation which are partially controlled with Precedex. Staff continues to have very reasonable concerns about safety given the multiple catheters required an ongoing dialysis. Patient is still not able to communicate in a lucid way. She did however have a swallowing study today that demonstrated that she was safely able to swallow thick liquids.  HPI:  Patient evaluated. Spoke briefly with the patient although she is not able to communicate well right now. Spoke with her aunt who is at bedside with her. I reviewed the chart and spoke to a worker at the patient's group home and look through the old chart. 45 year old woman with a long-standing history of schizophrenia that appears to of been relatively stable. Admitted to the hospital yesterday with what sounds like acute sepsis. Patient has been unable to take oral medication and has been off her medicine for at least a couple days. On interview and evaluation I found the patient to be  awake and have some responsiveness. She was able to shake her head and nod slightly although I don't think it's consistently appropriate answers. She appears to be able to follow very simple one-step commands as long as it's not overly physically taxing. Patient is not reported to of been agitated or aggressive or violent. Not showing acute bizarre behavior. I was able to obtain a list of her medicines from her group home. Patient continues to be maintained on clozapine as her primary antipsychotic. She's been on this for years. Also takes small doses of lorazepam and citalopram and as needed doses of Risperdal. No report that she had had any recent change in her behavior. There was a report that when she came into the hospital she had been attending school within the last day which would suggest that she's probably doing relatively well.  Medical history: Acute sepsis with multiorgan failure in the intensive care unit. Longer term the patient has a known history of Crohn's disease. Also high blood pressure and dyslipidemia  Substance abuse history: None  Social history: I spoke with her aunt from [place] who is at the bedside. That on reports that as far she knows she is the closest relative. Not clear whether the patient has a guardian although she suspects she might  Past Psychiatric History: Patient has a long-standing history of schizophrenia. Appears to of been able to stay stable in the community for a long period of time. The only previous psychiatric note I have on her is a consult from 2013 when I saw her when she was in the hospital acutely sick. I can't find  any other record of any psychiatric hospitalizations. She goes to [facility] and gets her medicine prescribed by Dr. Jacqualine Code. Has been on clozapine a long time. There is at least one note in the chart from years ago mentioning that she was acting bizarre or making delusional statements while sick.  Risk to Self:  no known acute risk Risk to  Others:  no known risk Prior Inpatient Therapy:  unknown. We don't have any record of it Prior Outpatient Therapy:  long-standing outpatient mental health treatment at [facility] or schizophrenia  Past Medical History:  Past Medical History:  Diagnosis Date  . Abnormal mammogram, unspecified 2013   Prev. cytology,hypercellular smears without evidence of malignant cells. The cytopathologist questioned if samples truly representative. Care taken during sampling and is felt to be representative.  . Breast screening, unspecified 2013  . Broken leg    age 20  . Cellulitis   . Crohn's disease ([place]) 2013  . Depression   . Diabetes mellitus without complication (HCC)    non insulin dependent  . Early menopause   . Edema   . GERD (gastroesophageal reflux disease)   . Hiatal hernia 2013  . Hyperlipidemia   . Hypertension   . Hypothyroidism   . 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 Obesity, unspecified 2013  . Other sign and symptom in breast 2013   Right bst US,lower outer quadrant,A single 0.3x0.4x0.6cm hypoechoic mass with slightly lobulated borders with adjacent 0.3x0.4x0.5cm mass was noted. The 1st was 5cm from nipple, 2nd at 8 cm from the nipple. The previous lesion aspirate was at 3 0'clock position. These lesions are thought to account for the mammographic abnormality. Minimal interval change on [place].   [facility])   . Rib fracture    age 33  . Schizophrenia ([place])   . TBI (traumatic brain injury) ([place])   . Thyroid disease    hypothyroid  . Vitamin D deficiency     Past Surgical History:  Procedure Laterality Date  . COLONOSCOPY WITH PROPOFOL N/A 02/16/2016   Procedure: COLONOSCOPY WITH PROPOFOL;  Surgeon: Lollie Sails, MD;  Location: [facility] ENDOSCOPY;  Service: Endoscopy;  Laterality: N/A;  . COLONOSCOPY WITH PROPOFOL N/A 02/22/2016   Procedure: COLONOSCOPY WITH PROPOFOL;  Surgeon: Lollie Sails, MD;  Location: [facility] ENDOSCOPY;  Service:  Endoscopy;  Laterality: N/A;  . EYE SURGERY Left 2013   cataract surgery  . TUMOR EXCISION Right    age of 56, tumor removed from RUE   Family History:  Family History  Problem Relation Age of Onset  . Cancer Mother 42    colon  . Cancer Paternal Aunt    Family Psychiatric  History: No information available from the patient or her family right now. Social History:  History  Alcohol Use No     History  Drug Use No    Social History   Social History  . Marital status: Single    Spouse name: N/A  . Number of children: N/A  . Years of education: N/A   Social History Main Topics  . Smoking status: Never Smoker  . Smokeless tobacco: Never Used  . Alcohol use No  . Drug use: No  . Sexual activity: Not Asked   Other Topics Concern  . None   Social History Narrative  . None   Additional Social History:    Allergies:   Allergies  Allergen Reactions  . Peanut Oil Other (See Comments)  Face turns red  . Risperidone And Related Cough    Labs:  Results for orders placed or performed during the hospital encounter of 09/27/16 (from the past 48 hour(s))  Glucose, capillary     Status: None   Collection Time: 10/01/16  7:23 PM  Result Value Ref Range   Glucose-Capillary 84 65 - 99 mg/dL  Renal function     Status: Abnormal   Collection Time: 10/01/16  9:59 PM  Result Value Ref Range   Sodium 140 135 - 145 mmol/L   Potassium 3.9 3.5 - 5.1 mmol/L   Chloride 106 101 - 111 mmol/L   CO2 26 22 - 32 mmol/L   Glucose, Bld 84 65 - 99 mg/dL   BUN 20 6 - 20 mg/dL   Creatinine, Ser 1.46 (H) 0.44 - 1.00 mg/dL   Calcium 8.3 (L) 8.9 - 10.3 mg/dL   Phosphorus 1.7 (L) 2.5 - 4.6 mg/dL   Albumin 2.1 (L) 3.5 - 5.0 g/dL   GFR calc non Af Amer 42 (L) >60 mL/min   GFR calc Af Amer 49 (L) >60 mL/min    Comment: (NOTE) The eGFR has been calculated using the CKD EPI equation. This calculation has not been validated in all clinical situations. eGFR's persistently <60 mL/min signify  possible Chronic Kidney Disease.    Anion gap 8 5 - 15  Glucose, capillary     Status: None   Collection Time: 10/01/16 10:28 PM  Result Value Ref Range   Glucose-Capillary 80 65 - 99 mg/dL   Comment 1 Notify RN   Glucose, capillary     Status: None   Collection Time: 10/02/16  3:30 AM  Result Value Ref Range   Glucose-Capillary 84 65 - 99 mg/dL   Comment 1 Notify RN   Basic metabolic panel     Status: Abnormal   Collection Time: 10/02/16  5:06 AM  Result Value Ref Range   Sodium 140 135 - 145 mmol/L   Potassium 3.7 3.5 - 5.1 mmol/L   Chloride 108 101 - 111 mmol/L   CO2 25 22 - 32 mmol/L   Glucose, Bld 83 65 - 99 mg/dL   BUN 27 (H) 6 - 20 mg/dL   Creatinine, Ser 1.88 (H) 0.44 - 1.00 mg/dL   Calcium 7.9 (L) 8.9 - 10.3 mg/dL   GFR calc non Af Amer 31 (L) >60 mL/min   GFR calc Af Amer 36 (L) >60 mL/min    Comment: (NOTE) The eGFR has been calculated using the CKD EPI equation. This calculation has not been validated in all clinical situations. eGFR's persistently <60 mL/min signify possible Chronic Kidney Disease.    Anion gap 7 5 - 15  CBC     Status: Abnormal   Collection Time: 10/02/16  5:06 AM  Result Value Ref Range   WBC 13.7 (H) 3.6 - 11.0 K/uL   RBC 2.50 (L) 3.80 - 5.20 MIL/uL   Hemoglobin 7.8 (L) 12.0 - 16.0 g/dL   HCT 23.0 (L) 35.0 - 47.0 %   MCV 91.7 80.0 - 100.0 fL   MCH 31.1 26.0 - 34.0 pg   MCHC 33.9 32.0 - 36.0 g/dL   RDW 14.6 (H) 11.5 - 14.5 %   Platelets 131 (L) 150 - 440 K/uL  Magnesium     Status: None   Collection Time: 10/02/16  5:06 AM  Result Value Ref Range   Magnesium 1.7 1.7 - 2.4 mg/dL  Phosphorus     Status: None  Collection Time: 10/02/16  5:06 AM  Result Value Ref Range   Phosphorus 2.5 2.5 - 4.6 mg/dL  Glucose, capillary     Status: None   Collection Time: 10/02/16  7:15 AM  Result Value Ref Range   Glucose-Capillary 84 65 - 99 mg/dL  Glucose, capillary     Status: None   Collection Time: 10/02/16 11:59 AM  Result Value Ref  Range   Glucose-Capillary 89 65 - 99 mg/dL  Renal function     Status: Abnormal   Collection Time: 10/02/16 12:50 PM  Result Value Ref Range   Sodium 141 135 - 145 mmol/L   Potassium 3.4 (L) 3.5 - 5.1 mmol/L   Chloride 108 101 - 111 mmol/L   CO2 25 22 - 32 mmol/L   Glucose, Bld 92 65 - 99 mg/dL   BUN 33 (H) 6 - 20 mg/dL   Creatinine, Ser 2.09 (H) 0.44 - 1.00 mg/dL   Calcium 7.7 (L) 8.9 - 10.3 mg/dL   Phosphorus 2.7 2.5 - 4.6 mg/dL   Albumin 1.8 (L) 3.5 - 5.0 g/dL   GFR calc non Af Amer 27 (L) >60 mL/min   GFR calc Af Amer 32 (L) >60 mL/min    Comment: (NOTE) The eGFR has been calculated using the CKD EPI equation. This calculation has not been validated in all clinical situations. eGFR's persistently <60 mL/min signify possible Chronic Kidney Disease.    Anion gap 8 5 - 15  Magnesium     Status: None   Collection Time: 10/02/16 12:50 PM  Result Value Ref Range   Magnesium 1.8 1.7 - 2.4 mg/dL  Glucose, capillary     Status: None   Collection Time: 10/02/16  3:42 PM  Result Value Ref Range   Glucose-Capillary 97 65 - 99 mg/dL  Magnesium     Status: None   Collection Time: 10/02/16  4:00 PM  Result Value Ref Range   Magnesium 1.8 1.7 - 2.4 mg/dL  Renal function panel (daily at 1600)     Status: Abnormal   Collection Time: 10/02/16  4:00 PM  Result Value Ref Range   Sodium 140 135 - 145 mmol/L   Potassium 3.6 3.5 - 5.1 mmol/L   Chloride 108 101 - 111 mmol/L   CO2 25 22 - 32 mmol/L   Glucose, Bld 104 (H) 65 - 99 mg/dL   BUN 29 (H) 6 - 20 mg/dL   Creatinine, Ser 1.77 (H) 0.44 - 1.00 mg/dL   Calcium 8.1 (L) 8.9 - 10.3 mg/dL   Phosphorus 2.5 2.5 - 4.6 mg/dL   Albumin 1.8 (L) 3.5 - 5.0 g/dL   GFR calc non Af Amer 34 (L) >60 mL/min   GFR calc Af Amer 39 (L) >60 mL/min    Comment: (NOTE) The eGFR has been calculated using the CKD EPI equation. This calculation has not been validated in all clinical situations. eGFR's persistently <60 mL/min signify possible Chronic  Kidney Disease.    Anion gap 7 5 - 15  Renal function     Status: Abnormal   Collection Time: 10/02/16  7:32 PM  Result Value Ref Range   Sodium 141 135 - 145 mmol/L   Potassium 3.8 3.5 - 5.1 mmol/L   Chloride 108 101 - 111 mmol/L   CO2 26 22 - 32 mmol/L   Glucose, Bld 114 (H) 65 - 99 mg/dL   BUN 29 (H) 6 - 20 mg/dL   Creatinine, Ser 1.56 (H) 0.44 - 1.00 mg/dL  Calcium 8.1 (L) 8.9 - 10.3 mg/dL   Phosphorus 2.8 2.5 - 4.6 mg/dL   Albumin 1.8 (L) 3.5 - 5.0 g/dL   GFR calc non Af Amer 39 (L) >60 mL/min   GFR calc Af Amer 45 (L) >60 mL/min    Comment: (NOTE) The eGFR has been calculated using the CKD EPI equation. This calculation has not been validated in all clinical situations. eGFR's persistently <60 mL/min signify possible Chronic Kidney Disease.    Anion gap 7 5 - 15  Magnesium     Status: None   Collection Time: 10/02/16  7:32 PM  Result Value Ref Range   Magnesium 1.9 1.7 - 2.4 mg/dL  Glucose, capillary     Status: Abnormal   Collection Time: 10/02/16  8:11 PM  Result Value Ref Range   Glucose-Capillary 108 (H) 65 - 99 mg/dL  Magnesium     Status: None   Collection Time: 10/03/16 12:01 AM  Result Value Ref Range   Magnesium 1.8 1.7 - 2.4 mg/dL  Renal function     Status: Abnormal   Collection Time: 10/03/16 12:01 AM  Result Value Ref Range   Sodium 141 135 - 145 mmol/L   Potassium 3.6 3.5 - 5.1 mmol/L   Chloride 108 101 - 111 mmol/L   CO2 25 22 - 32 mmol/L   Glucose, Bld 98 65 - 99 mg/dL   BUN 30 (H) 6 - 20 mg/dL   Creatinine, Ser 1.81 (H) 0.44 - 1.00 mg/dL   Calcium 7.9 (L) 8.9 - 10.3 mg/dL   Phosphorus 2.5 2.5 - 4.6 mg/dL   Albumin 2.0 (L) 3.5 - 5.0 g/dL   GFR calc non Af Amer 33 (L) >60 mL/min   GFR calc Af Amer 38 (L) >60 mL/min    Comment: (NOTE) The eGFR has been calculated using the CKD EPI equation. This calculation has not been validated in all clinical situations. eGFR's persistently <60 mL/min signify possible Chronic Kidney Disease.    Anion  gap 8 5 - 15  Glucose, capillary     Status: Abnormal   Collection Time: 10/03/16 12:26 AM  Result Value Ref Range   Glucose-Capillary 106 (H) 65 - 99 mg/dL  Magnesium     Status: None   Collection Time: 10/03/16  4:06 AM  Result Value Ref Range   Magnesium 1.8 1.7 - 2.4 mg/dL  Renal function     Status: Abnormal   Collection Time: 10/03/16  4:06 AM  Result Value Ref Range   Sodium 141 135 - 145 mmol/L   Potassium 3.5 3.5 - 5.1 mmol/L   Chloride 109 101 - 111 mmol/L   CO2 25 22 - 32 mmol/L   Glucose, Bld 105 (H) 65 - 99 mg/dL   BUN 27 (H) 6 - 20 mg/dL   Creatinine, Ser 1.58 (H) 0.44 - 1.00 mg/dL   Calcium 8.0 (L) 8.9 - 10.3 mg/dL   Phosphorus 2.7 2.5 - 4.6 mg/dL   Albumin 1.9 (L) 3.5 - 5.0 g/dL   GFR calc non Af Amer 39 (L) >60 mL/min   GFR calc Af Amer 45 (L) >60 mL/min    Comment: (NOTE) The eGFR has been calculated using the CKD EPI equation. This calculation has not been validated in all clinical situations. eGFR's persistently <60 mL/min signify possible Chronic Kidney Disease.    Anion gap 7 5 - 15  CBC     Status: Abnormal   Collection Time: 10/03/16  4:06 AM  Result Value Ref  Range   WBC 25.1 (H) 3.6 - 11.0 K/uL   RBC 2.98 (L) 3.80 - 5.20 MIL/uL   Hemoglobin 9.1 (L) 12.0 - 16.0 g/dL   HCT 27.4 (L) 35.0 - 47.0 %   MCV 91.8 80.0 - 100.0 fL   MCH 30.5 26.0 - 34.0 pg   MCHC 33.2 32.0 - 36.0 g/dL   RDW 14.6 (H) 11.5 - 14.5 %   Platelets 149 (L) 150 - 440 K/uL  APTT     Status: Abnormal   Collection Time: 10/03/16  4:06 AM  Result Value Ref Range   aPTT 38 (H) 24 - 36 seconds    Comment:        IF BASELINE aPTT IS ELEVATED, SUGGEST PATIENT RISK ASSESSMENT BE USED TO DETERMINE APPROPRIATE ANTICOAGULANT THERAPY.   Glucose, capillary     Status: Abnormal   Collection Time: 10/03/16  4:16 AM  Result Value Ref Range   Glucose-Capillary 102 (H) 65 - 99 mg/dL  Magnesium     Status: None   Collection Time: 10/03/16  7:38 AM  Result Value Ref Range   Magnesium 1.7  1.7 - 2.4 mg/dL  Renal function     Status: Abnormal   Collection Time: 10/03/16  7:38 AM  Result Value Ref Range   Sodium 141 135 - 145 mmol/L   Potassium 3.8 3.5 - 5.1 mmol/L   Chloride 109 101 - 111 mmol/L   CO2 26 22 - 32 mmol/L   Glucose, Bld 98 65 - 99 mg/dL   BUN 25 (H) 6 - 20 mg/dL   Creatinine, Ser 1.45 (H) 0.44 - 1.00 mg/dL   Calcium 7.9 (L) 8.9 - 10.3 mg/dL   Phosphorus 2.6 2.5 - 4.6 mg/dL   Albumin 1.9 (L) 3.5 - 5.0 g/dL   GFR calc non Af Amer 43 (L) >60 mL/min   GFR calc Af Amer 50 (L) >60 mL/min    Comment: (NOTE) The eGFR has been calculated using the CKD EPI equation. This calculation has not been validated in all clinical situations. eGFR's persistently <60 mL/min signify possible Chronic Kidney Disease.    Anion gap 6 5 - 15  Glucose, capillary     Status: None   Collection Time: 10/03/16  7:58 AM  Result Value Ref Range   Glucose-Capillary 91 65 - 99 mg/dL  Magnesium     Status: None   Collection Time: 10/03/16 12:50 PM  Result Value Ref Range   Magnesium 1.8 1.7 - 2.4 mg/dL  Glucose, capillary     Status: None   Collection Time: 10/03/16  1:01 PM  Result Value Ref Range   Glucose-Capillary 77 65 - 99 mg/dL  Magnesium     Status: None   Collection Time: 10/03/16  4:00 PM  Result Value Ref Range   Magnesium 1.7 1.7 - 2.4 mg/dL  Renal function     Status: Abnormal   Collection Time: 10/03/16  4:00 PM  Result Value Ref Range   Sodium 141 135 - 145 mmol/L   Potassium 3.5 3.5 - 5.1 mmol/L   Chloride 107 101 - 111 mmol/L   CO2 26 22 - 32 mmol/L   Glucose, Bld 106 (H) 65 - 99 mg/dL   BUN 25 (H) 6 - 20 mg/dL   Creatinine, Ser 1.40 (H) 0.44 - 1.00 mg/dL   Calcium 8.3 (L) 8.9 - 10.3 mg/dL   Phosphorus 1.4 (L) 2.5 - 4.6 mg/dL   Albumin 2.1 (L) 3.5 - 5.0 g/dL   GFR  calc non Af Amer 45 (L) >60 mL/min   GFR calc Af Amer 52 (L) >60 mL/min    Comment: (NOTE) The eGFR has been calculated using the CKD EPI equation. This calculation has not been validated in  all clinical situations. eGFR's persistently <60 mL/min signify possible Chronic Kidney Disease.    Anion gap 8 5 - 15  Glucose, capillary     Status: None   Collection Time: 10/03/16  4:14 PM  Result Value Ref Range   Glucose-Capillary 98 65 - 99 mg/dL    Current Facility-Administered Medications  Medication Dose Route Frequency Provider Last Rate Last Dose  . 0.9 %  sodium chloride infusion  250 mL Intravenous PRN Flora Lipps, MD      . 0.9 %  sodium chloride infusion  250 mL Intravenous PRN Flora Lipps, MD 5 mL/hr at 10/03/16 1600 250 mL at 10/03/16 1600  . acetaminophen (TYLENOL) tablet 650 mg  650 mg Oral Q4H PRN Flora Lipps, MD   650 mg at 09/27/16 1805  . albumin human 25 % solution 12.5 g  12.5 g Intravenous Q8H Sarath Kolluru, MD   12.5 g at 10/03/16 1500  . chlorhexidine (PERIDEX) 0.12 % solution 15 mL  15 mL Mouth Rinse BID Flora Lipps, MD   15 mL at 10/03/16 0922  . citalopram (CELEXA) tablet 40 mg  40 mg Oral Daily Gonzella Lex, MD      . cloZAPine (CLOZARIL) tablet 200 mg  200 mg Oral QHS Gonzella Lex, MD      . dexmedetomidine (PRECEDEX) 400 MCG/100ML (4 mcg/mL) infusion  0.2-1.2 mcg/kg/hr Intravenous Titrated Mikael Spray, NP 20.1 mL/hr at 10/03/16 1614 0.6 mcg/kg/hr at 10/03/16 1614  . famotidine (PEPCID) IVPB 20 mg premix  20 mg Intravenous Q24H Sheema M Hallaji, RPH   20 mg at 10/03/16 1259  . heparin injection 1,000-6,000 Units  1,000-6,000 Units CRRT PRN Munsoor Lateef, MD   1,000 Units at 10/02/16 2340  . heparin injection 5,000 Units  5,000 Units Subcutaneous Q8H Flora Lipps, MD   5,000 Units at 10/03/16 1350  . hydrocortisone sodium succinate (SOLU-CORTEF) 100 MG injection 25 mg  25 mg Intravenous Q8H Laverle Hobby, MD   25 mg at 10/03/16 1350  . LORazepam (ATIVAN) injection 2 mg  2 mg Intravenous Q2H PRN Flora Lipps, MD   2 mg at 10/03/16 1635  . LORazepam (ATIVAN) tablet 1 mg  1 mg Oral q morning - 10a Merilyn Baba, RPH      . MEDLINE mouth  rinse  15 mL Mouth Rinse q12n4p Flora Lipps, MD   15 mL at 10/03/16 1605  . norepinephrine (LEVOPHED) 16 mg in dextrose 5 % 250 mL (0.064 mg/mL) infusion  0-75 mcg/min Intravenous Titrated Awilda Bill, NP   Stopped at 10/02/16 0030  . ondansetron (ZOFRAN) injection 4 mg  4 mg Intravenous Q6H PRN Flora Lipps, MD      . phenylephrine (NEO-SYNEPHRINE) 40 mg in dextrose 5 % 250 mL (0.16 mg/mL) infusion  30-200 mcg/min Intravenous Continuous Holley Raring, NP   Stopped at 09/30/16 0915  . piperacillin-tazobactam (ZOSYN) IVPB 3.375 g  3.375 g Intravenous Q8H Merilyn Baba, RPH   3.375 g at 10/03/16 1614  . pureflow IV solution for Dialysis   CRRT Continuous Munsoor Lateef, MD 2,000 mL/hr at 10/03/16 1215 3 each at 10/03/16 1215  . sodium chloride flush (NS) 0.9 % injection 10-40 mL  10-40 mL Intracatheter Q12H Flora Lipps, MD  10 mL at 10/03/16 0921  . sodium chloride flush (NS) 0.9 % injection 3 mL  3 mL Intravenous Q12H Flora Lipps, MD   3 mL at 10/03/16 0922  . vasopressin (PITRESSIN) 40 Units in sodium chloride 0.9 % 250 mL (0.16 Units/mL) infusion  0.04 Units/min Intravenous Continuous Bincy S Varughese, NP 15 mL/hr at 10/03/16 1600 0.04 Units/min at 10/03/16 1600    Musculoskeletal: Strength & Muscle Tone: decreased Gait & Station: unable to stand Patient leans: Backward  Psychiatric Specialty Exam: Physical Exam  Nursing note and vitals reviewed. Constitutional: She appears well-developed and well-nourished. She appears distressed.  HENT:  Head: Normocephalic and atraumatic.  Eyes: Conjunctivae are normal. Pupils are equal, round, and reactive to light.  Neck: Normal range of motion.  Cardiovascular: Normal heart sounds.   Respiratory: She is in respiratory distress.  GI: Soft. She exhibits distension.  Musculoskeletal: Normal range of motion.  Neurological: She is alert.  Skin: Skin is warm and dry.  Psychiatric: Her affect is blunt. Her speech is delayed. She is slowed and  withdrawn. Cognition and memory are impaired.    Review of Systems  Unable to perform ROS: Medical condition    Blood pressure (!) 113/102, pulse (!) 135, temperature 98.5 F (36.9 C), temperature source Axillary, resp. rate 17, height 5' 6"  (1.676 m), weight 134 kg (295 lb 6.7 oz), SpO2 96 %.Body mass index is 47.68 kg/m.  General Appearance: Disheveled  Eye Contact:  Fair  Speech:  Negative  Volume:  Decreased  Mood:  Negative  Affect:  Blunt and Flat  Thought Process:  NA  Orientation:  Negative  Thought Content:  Negative  Suicidal Thoughts:  No  Homicidal Thoughts:  No  Memory:  Negative  Judgement:  Negative  Insight:  Negative  Psychomotor Activity:  Negative  Concentration:  Concentration: Poor  Recall:  Poor  Fund of Knowledge:  Negative  Language:  Negative  Akathisia:  Negative  Handed:  Right  AIMS (if indicated):     Assets:  Financial Resources/Insurance Housing Social Support  ADL's:  Impaired  Cognition:  Impaired,  Moderate and Severe  Sleep:        Treatment Plan Summary: Daily contact with patient to assess and evaluate symptoms and progress in treatment, Medication management and Plan Reviewed situation with nursing. Patient for now is okay controlled with the Precedex drip. I agreed that if she is able to swallow we should restart her usual outpatient medicines. I have discontinue the olanzapine and restarted clozapine 200 mg at night as well as her Celexa 40 mg a day. Continue when necessary medicines as needed to keep her safe. I will continue to follow-up.  Disposition: Patient does not meet criteria for psychiatric inpatient admission.  Alethia Berthold, MD 10/03/2016 6:29 PM

## 2016-10-03 NOTE — Progress Notes (Signed)
[facility]  ROUNDING NOTE   Subjective:   Restarted on CRRT yesterday. UOP 293 UF 1511 On vaopressin only   Objective:  Vital signs in last 24 hours:  Temp:  [96.1 F (35.6 C)-98.3 F (36.8 C)] 98.3 F (36.8 C) (12/04 0800) Pulse Rate:  [48-102] 64 (12/04 1000) Resp:  [11-25] 16 (12/04 1000) BP: (78-122)/(48-94) 98/69 (12/04 1000) SpO2:  [91 %-97 %] 94 % (12/04 1000) Weight:  [134 kg (295 lb 6.7 oz)] 134 kg (295 lb 6.7 oz) (12/04 0420)  Weight change: 3.6 kg (7 lb 15 oz) Filed Weights   10/01/16 0500 10/02/16 0500 10/03/16 0420  Weight: 130.4 kg (287 lb 7.7 oz) 130.4 kg (287 lb 7.7 oz) 134 kg (295 lb 6.7 oz)    Intake/Output: I/O last 3 completed shifts: In: 1516.3 [I.V.:1166.3; IV Piggyback:350] Out: 2199 [Urine:437; Other:1762]   Intake/Output this shift:  Total I/O In: 150.6 [I.V.:150.6] Out: 289 [Other:289]  Physical Exam: General: No acute distress  Head: Normocephalic, atraumatic. OM moist  Eyes: Anicteric  Neck: Supple, trachea midline  Lungs:  Scattered rhonchi, normal effort   Heart: S1S2 No rubs   Abdomen:  Umbilical hernia +, BS present   Extremities: 1+ peripheral edema.  Neurologic: sedated  Skin: No lesions  Access: L IJ temporary dialysis catheter placed [id number]    Basic Metabolic Panel:  Recent Labs Lab 10/02/16 1600 10/02/16 1932 10/03/16 0001 10/03/16 0406 10/03/16 0738  NA 140 141 141 141 141  K 3.6 3.8 3.6 3.5 3.8  CL 108 108 108 109 109  CO2 25 26 25 25 26   GLUCOSE 104* 114* 98 105* 98  BUN 29* 29* 30* 27* 25*  CREATININE 1.77* 1.56* 1.81* 1.58* 1.45*  CALCIUM 8.1* 8.1* 7.9* 8.0* 7.9*  MG 1.8 1.9 1.8 1.8 1.7  PHOS 2.5 2.8 2.5 2.7 2.6    Liver Function Tests:  Recent Labs Lab 09/27/16 0603 09/27/16 1109  10/02/16 1600 10/02/16 1932 10/03/16 0001 10/03/16 0406 10/03/16 0738  AST 48* 52*  --   --   --   --   --   --   ALT 19 22  --   --   --   --   --   --   ALKPHOS 77 67  --   --   --   --   --    --   BILITOT 1.0 0.9  --   --   --   --   --   --   PROT 6.3* 5.7*  --   --   --   --   --   --   ALBUMIN 2.8* 2.6*  < > 1.8* 1.8* 2.0* 1.9* 1.9*  < > = values in this interval not displayed. No results for input(s): LIPASE, AMYLASE in the last 168 hours. No results for input(s): AMMONIA in the last 168 hours.  CBC:  Recent Labs Lab 09/27/16 0603 09/27/16 1109  09/29/16 0508 09/30/16 0607 10/01/16 0500 10/02/16 0506 10/03/16 0406  WBC 25.9* 16.7*  < > 13.0* 8.6 7.6 13.7* 25.1*  NEUTROABS 19.5* 13.6*  --  11.1* 6.6*  --   --   --   HGB 11.9* 11.8*  < > 11.1* 10.1* 9.7* 7.8* 9.1*  HCT 35.8 35.6  < > 32.7* 29.7* 29.1* 23.0* 27.4*  MCV 96.7 93.8  < > 91.8 91.5 91.7 91.7 91.8  PLT 353 283  < > 224 138* 128* 131* 149*  < > = values  in this interval not displayed.  Cardiac Enzymes:  Recent Labs Lab 09/27/16 0603  TROPONINI 0.03*    BNP: Invalid input(s): POCBNP  CBG:  Recent Labs Lab 10/02/16 1542 10/02/16 2011 10/03/16 0026 10/03/16 0416 10/03/16 0758  GLUCAP 97 108* 106* 102* 91    Microbiology: Results for orders placed or performed during the hospital encounter of 09/27/16  Blood Culture (routine x 2)     Status: None   Collection Time: 09/27/16  6:02 AM  Result Value Ref Range Status   Specimen Description BLOOD  R AC  Final   Special Requests   Final    BOTTLES DRAWN AEROBIC AND ANAEROBIC  AER 3 ML ANA 4 ML   Culture NO GROWTH 5 DAYS  Final   Report Status 10/02/2016 FINAL  Final  Blood Culture (routine x 2)     Status: None   Collection Time: 09/27/16  6:03 AM  Result Value Ref Range Status   Specimen Description BLOOD  R ARM  Final   Special Requests   Final    BOTTLES DRAWN AEROBIC AND ANAEROBIC  AER 6 ML ANA 10 ML   Culture NO GROWTH 5 DAYS  Final   Report Status 10/02/2016 FINAL  Final  Urine culture     Status: None   Collection Time: 09/27/16  9:30 AM  Result Value Ref Range Status   Specimen Description URINE, RANDOM  Final   Special  Requests NONE  Final   Culture NO GROWTH Performed at [facility]   Final   Report Status 09/28/2016 FINAL  Final  MRSA PCR Screening     Status: None   Collection Time: 09/27/16 11:09 AM  Result Value Ref Range Status   MRSA by PCR NEGATIVE NEGATIVE Final    Comment:        The GeneXpert MRSA Assay (FDA approved for NASAL specimens only), is one component of a comprehensive MRSA colonization surveillance program. It is not intended to diagnose MRSA infection nor to guide or monitor treatment for MRSA infections.     Coagulation Studies: No results for input(s): LABPROT, INR in the last 72 hours.  Urinalysis: No results for input(s): COLORURINE, LABSPEC, PHURINE, GLUCOSEU, HGBUR, BILIRUBINUR, KETONESUR, PROTEINUR, UROBILINOGEN, NITRITE, LEUKOCYTESUR in the last 72 hours.  Invalid input(s): APPERANCEUR    Imaging: Dg Chest Port 1 View  Result Date: 10/02/2016 CLINICAL DATA:  45 year old female with history of acute respiratory failure. EXAM: PORTABLE CHEST 1 VIEW COMPARISON:  Chest x-ray 10/01/2016. FINDINGS: There is a right-sided internal jugular central venous catheter with tip terminating in the distal superior vena cava. Left internal jugular Vas-Cath with tip terminating in the distal superior vena cava. Lung volumes are low. Elevated right hemidiaphragm. Bibasilar opacities are favored to reflect areas of subsegmental atelectasis. Perihilar linear densities are also most compatible with areas of subsegmental atelectasis. Possible small left pleural effusion. No evidence of pulmonary edema. Heart size is normal. Upper mediastinal contours are within normal limits. IMPRESSION: 1. Support apparatus, as above. 2. Low lung volumes with bibasilar subsegmental atelectasis and probable small left pleural effusion. Electronically Signed   By: Vinnie Langton M.D.   On: 10/02/2016 07:54     Medications:   . dexmedetomidine 0.601 mcg/kg/hr (10/03/16 1000)  .  norepinephrine (LEVOPHED) Adult infusion Stopped (10/02/16 0030)  . phenylephrine (NEO-SYNEPHRINE) Adult infusion Stopped (09/30/16 0915)  . pureflow 3 each (10/03/16 0039)  . vasopressin (PITRESSIN) infusion - *FOR SHOCK* 0.04 Units/min (10/03/16 1000)   . chlorhexidine  15 mL Mouth Rinse BID  . famotidine (PEPCID) IV  20 mg Intravenous Q24H  . heparin  5,000 Units Subcutaneous Q8H  . hydrocortisone sod succinate (SOLU-CORTEF) inj  25 mg Intravenous Q8H  . LORazepam  1 mg Oral q morning - 10a  . mouth rinse  15 mL Mouth Rinse q12n4p  . OLANZapine zydis  10 mg Oral QHS  . piperacillin-tazobactam (ZOSYN)  IV  3.375 g Intravenous Q8H  . sodium chloride flush  10-40 mL Intracatheter Q12H  . sodium chloride flush  3 mL Intravenous Q12H   sodium chloride, sodium chloride, acetaminophen, heparin, LORazepam, ondansetron (ZOFRAN) IV  Assessment/ Plan:  45 y.o.white female with a PMHx of Crohn's disease, depression, diabetes mellitus type 2, GERD, hiatal hernia, hyperlipidemia, hypertension, hypothyroidism, obesity, schizophrenia, traumatic brain injury, vitamin D deficiency, who was admitted to [facility] on 09/27/2016 for evaluation of acute respiratory failure.     1.  Acute renal failure with hyperkalemia and metabolic acidosis: anuric. No known baseline creatinine in last 3 years.  Acute renal failure sepsis, hypotension, and hypoxemia - Continue CRRT  2.  Acute respiratory failure: vent dependent - solucortef  3. Sepsis: cultures negative - on vasopressin - pip/tazo  4. Nutrition - start IV albumin    LOS: [place], [place] 12/4/201710:26 AM

## 2016-10-04 LAB — RENAL FUNCTION PANEL
ALBUMIN: 2.4 g/dL — AB (ref 3.5–5.0)
ALBUMIN: 2.4 g/dL — AB (ref 3.5–5.0)
ANION GAP: 6 (ref 5–15)
ANION GAP: 5 (ref 5–15)
Albumin: 2.3 g/dL — ABNORMAL LOW (ref 3.5–5.0)
Anion gap: 5 (ref 5–15)
BUN: 20 mg/dL (ref 6–20)
BUN: 19 mg/dL (ref 6–20)
BUN: 18 mg/dL (ref 6–20)
CALCIUM: 8.5 mg/dL — AB (ref 8.9–10.3)
CHLORIDE: 107 mmol/L (ref 101–111)
CO2: 28 mmol/L (ref 22–32)
CO2: 29 mmol/L (ref 22–32)
CO2: 27 mmol/L (ref 22–32)
CREATININE: 1.11 mg/dL — AB (ref 0.44–1.00)
CREATININE: 1.16 mg/dL — AB (ref 0.44–1.00)
Calcium: 8.4 mg/dL — ABNORMAL LOW (ref 8.9–10.3)
Calcium: 8.4 mg/dL — ABNORMAL LOW (ref 8.9–10.3)
Chloride: 107 mmol/L (ref 101–111)
Chloride: 110 mmol/L (ref 101–111)
Creatinine, Ser: 1.24 mg/dL — ABNORMAL HIGH (ref 0.44–1.00)
GFR calc Af Amer: 60 mL/min — ABNORMAL LOW (ref >60)
GFR calc Af Amer: >60 mL/min (ref >60)
GFR calc non Af Amer: 52 mL/min — ABNORMAL LOW (ref >60)
GFR calc non Af Amer: 56 mL/min — ABNORMAL LOW (ref >60)
GFR, EST NON AFRICAN AMERICAN: 59 mL/min — AB (ref >60)
GLUCOSE: 107 mg/dL — AB (ref 65–99)
GLUCOSE: 92 mg/dL (ref 65–99)
Glucose, Bld: 99 mg/dL (ref 65–99)
PHOSPHORUS: 2.2 mg/dL — AB (ref 2.5–4.6)
PHOSPHORUS: 1.9 mg/dL — AB (ref 2.5–4.6)
POTASSIUM: 3.8 mmol/L (ref 3.5–5.1)
POTASSIUM: 3.9 mmol/L (ref 3.5–5.1)
Phosphorus: 1.9 mg/dL — ABNORMAL LOW (ref 2.5–4.6)
Potassium: 4 mmol/L (ref 3.5–5.1)
SODIUM: 142 mmol/L (ref 135–145)
Sodium: 141 mmol/L (ref 135–145)
Sodium: 141 mmol/L (ref 135–145)

## 2016-10-04 LAB — GLUCOSE, CAPILLARY
GLUCOSE-CAPILLARY: 89 mg/dL (ref 65–99)
GLUCOSE-CAPILLARY: 97 mg/dL (ref 65–99)
Glucose-Capillary: 106 mg/dL — ABNORMAL HIGH (ref 65–99)
Glucose-Capillary: 108 mg/dL — ABNORMAL HIGH (ref 65–99)
Glucose-Capillary: 88 mg/dL (ref 65–99)
Glucose-Capillary: 97 mg/dL (ref 65–99)

## 2016-10-04 LAB — CBC
HEMATOCRIT: 26.5 % — AB (ref 35.0–47.0)
Hemoglobin: 8.8 g/dL — ABNORMAL LOW (ref 12.0–16.0)
MCH: 31.1 pg (ref 26.0–34.0)
MCHC: 33.3 g/dL (ref 32.0–36.0)
MCV: 93.4 fL (ref 80.0–100.0)
Platelets: 146 10*3/uL — ABNORMAL LOW (ref 150–440)
RBC: 2.84 MIL/uL — ABNORMAL LOW (ref 3.80–5.20)
RDW: 14.1 % (ref 11.5–14.5)
WBC: 24.8 10*3/uL — AB (ref 3.6–11.0)

## 2016-10-04 LAB — MAGNESIUM
MAGNESIUM: 1.7 mg/dL (ref 1.7–2.4)
MAGNESIUM: 1.7 mg/dL (ref 1.7–2.4)
Magnesium: 1.8 mg/dL (ref 1.7–2.4)

## 2016-10-04 LAB — C DIFFICILE QUICK SCREEN W PCR REFLEX
C DIFFICILE (CDIFF) INTERP: NOT DETECTED
C DIFFICILE (CDIFF) TOXIN: NEGATIVE
C Diff antigen: NEGATIVE

## 2016-10-04 LAB — APTT: APTT: 40 s — AB (ref 24–36)

## 2016-10-04 MED ORDER — DEXTROSE 5 % IV SOLN
30.0000 mmol | Freq: Once | INTRAVENOUS | Status: AC
  Administered 2016-10-04: 30 mmol via INTRAVENOUS
  Filled 2016-10-04: qty 10

## 2016-10-04 MED ORDER — HYDROCORTISONE NA SUCCINATE PF 100 MG IJ SOLR
25.0000 mg | Freq: Every day | INTRAMUSCULAR | Status: DC
  Administered 2016-10-05 – 2016-10-12 (×8): 25 mg via INTRAVENOUS
  Filled 2016-10-04 (×8): qty 2

## 2016-10-04 MED ORDER — CLOZAPINE 100 MG PO TABS
200.0000 mg | ORAL_TABLET | Freq: Every day | ORAL | Status: DC
  Administered 2016-10-04 – 2016-10-18 (×14): 200 mg via ORAL
  Filled 2016-10-04 (×16): qty 2

## 2016-10-04 NOTE — Progress Notes (Signed)
Chaplain rounded the unit to provide a compassionate presence and support to the patient. Patient appeared to be sleeping. No visitors were at the bedside. Chaplain provided silent prayer.  Minerva Fester [phone]

## 2016-10-04 NOTE — Consult Note (Signed)
[facility] Consult   Reason for Consult:  Consult for 45 year old woman with a history of schizophrenia currently in the intensive care unit with sepsis Referring Physician:  Mortimer Fries Patient Identification: [name] MRN:  [id number] Principal Diagnosis: Schizophrenia Diagnosis:   Patient Active Problem List   Diagnosis Date Noted  . Pressure injury of skin [L89.90] 10/02/2016  . Schizophrenia ([place]) [F20.9] 09/30/2016  . Acute renal failure (ARF) ([place]) [N17.9]   . Respiratory failure ([place]) [J96.90] 09/27/2016  . Other sign and symptom in breast [N64.59]     Total Time spent with patient: 30 minutes  Subjective:   [name] is a 45 y.o. female patient admitted with patient was not able to give any history currently because of medical condition.  Follow-up for this patient was schizophrenia continuing to show a little bit of improvement from sepsis. Patient seen. Spoke with nursing. Patient did not get her clozapine last night but will be getting it tonight. Still occasionally a little bit agitated but has not been violent and has not harmed herself. She is still able to take soft foods in which should mean she will be able to get back on her medicine without difficulty  HPI:  Patient evaluated. Spoke briefly with the patient although she is not able to communicate well right now. Spoke with her aunt who is at bedside with her. I reviewed the chart and spoke to a worker at the patient's group home and look through the old chart. 45 year old woman with a long-standing history of schizophrenia that appears to of been relatively stable. Admitted to the hospital yesterday with what sounds like acute sepsis. Patient has been unable to take oral medication and has been off her medicine for at least a couple days. On interview and evaluation I found the patient to be awake and have some responsiveness. She was able to shake her head and nod slightly although I don't think it's  consistently appropriate answers. She appears to be able to follow very simple one-step commands as long as it's not overly physically taxing. Patient is not reported to of been agitated or aggressive or violent. Not showing acute bizarre behavior. I was able to obtain a list of her medicines from her group home. Patient continues to be maintained on clozapine as her primary antipsychotic. She's been on this for years. Also takes small doses of lorazepam and citalopram and as needed doses of Risperdal. No report that she had had any recent change in her behavior. There was a report that when she came into the hospital she had been attending school within the last day which would suggest that she's probably doing relatively well.  Medical history: Acute sepsis with multiorgan failure in the intensive care unit. Longer term the patient has a known history of Crohn's disease. Also high blood pressure and dyslipidemia  Substance abuse history: None  Social history: I spoke with her aunt from [place] who is at the bedside. That on reports that as far she knows she is the closest relative. Not clear whether the patient has a guardian although she suspects she might  Past Psychiatric History: Patient has a long-standing history of schizophrenia. Appears to of been able to stay stable in the community for a long period of time. The only previous psychiatric note I have on her is a consult from 2013 when I saw her when she was in the hospital acutely sick. I can't find any other record of any psychiatric hospitalizations.  She goes to [facility] and gets her medicine prescribed by Dr. Jacqualine Code. Has been on clozapine a long time. There is at least one note in the chart from years ago mentioning that she was acting bizarre or making delusional statements while sick.  Risk to Self:  no known acute risk Risk to Others:  no known risk Prior Inpatient Therapy:  unknown. We don't have any record of it Prior Outpatient Therapy:   long-standing outpatient mental health treatment at [facility] or schizophrenia  Past Medical History:  Past Medical History:  Diagnosis Date  . Abnormal mammogram, unspecified 2013   Prev. cytology,hypercellular smears without evidence of malignant cells. The cytopathologist questioned if samples truly representative. Care taken during sampling and is felt to be representative.  . Breast screening, unspecified 2013  . Broken leg    age 78  . Cellulitis   . Crohn's disease ([place]) 2013  . Depression   . Diabetes mellitus without complication (HCC)    non insulin dependent  . Early menopause   . Edema   . GERD (gastroesophageal reflux disease)   . Hiatal hernia 2013  . Hyperlipidemia   . Hypertension   . Hypothyroidism   . 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 Obesity, unspecified 2013  . Other sign and symptom in breast 2013   Right bst US,lower outer quadrant,A single 0.3x0.4x0.6cm hypoechoic mass with slightly lobulated borders with adjacent 0.3x0.4x0.5cm mass was noted. The 1st was 5cm from nipple, 2nd at 8 cm from the nipple. The previous lesion aspirate was at 3 0'clock position. These lesions are thought to account for the mammographic abnormality. Minimal interval change on [place].   [facility])   . Rib fracture    age 5  . Schizophrenia ([place])   . TBI (traumatic brain injury) ([place])   . Thyroid disease    hypothyroid  . Vitamin D deficiency     Past Surgical History:  Procedure Laterality Date  . COLONOSCOPY WITH PROPOFOL N/A 02/16/2016   Procedure: COLONOSCOPY WITH PROPOFOL;  Surgeon: Lollie Sails, MD;  [facility];  Service: Endoscopy;  Laterality: N/A;  . COLONOSCOPY WITH PROPOFOL N/A 02/22/2016   Procedure: COLONOSCOPY WITH PROPOFOL;  Surgeon: Lollie Sails, MD;  Location: [facility] ENDOSCOPY;  Service: Endoscopy;  Laterality: N/A;  . EYE SURGERY Left 2013   cataract surgery  . TUMOR EXCISION Right    age of 47, tumor  removed from RUE   Family History:  Family History  Problem Relation Age of Onset  . Cancer Mother 16    colon  . Cancer Paternal Aunt    Family Psychiatric  History: No information available from the patient or her family right now. Social History:  History  Alcohol Use No     History  Drug Use No    Social History   Social History  . Marital status: Single    Spouse name: N/A  . Number of children: N/A  . Years of education: N/A   Social History Main Topics  . Smoking status: Never Smoker  . Smokeless tobacco: Never Used  . Alcohol use No  . Drug use: No  . Sexual activity: Not Asked   Other Topics Concern  . None   Social History Narrative  . None   Additional Social History:    Allergies:   Allergies  Allergen Reactions  . Peanut Oil Other (See Comments)    Face turns red  .  Risperidone And Related Cough    Labs:  Results for orders placed or performed during the hospital encounter of 09/27/16 (from the past 48 hour(s))  Renal function     Status: Abnormal   Collection Time: 10/02/16  7:32 PM  Result Value Ref Range   Sodium 141 135 - 145 mmol/L   Potassium 3.8 3.5 - 5.1 mmol/L   Chloride 108 101 - 111 mmol/L   CO2 26 22 - 32 mmol/L   Glucose, Bld 114 (H) 65 - 99 mg/dL   BUN 29 (H) 6 - 20 mg/dL   Creatinine, Ser 1.56 (H) 0.44 - 1.00 mg/dL   Calcium 8.1 (L) 8.9 - 10.3 mg/dL   Phosphorus 2.8 2.5 - 4.6 mg/dL   Albumin 1.8 (L) 3.5 - 5.0 g/dL   GFR calc non Af Amer 39 (L) >60 mL/min   GFR calc Af Amer 45 (L) >60 mL/min    Comment: (NOTE) The eGFR has been calculated using the CKD EPI equation. This calculation has not been validated in all clinical situations. eGFR's persistently <60 mL/min signify possible Chronic Kidney Disease.    Anion gap 7 5 - 15  Magnesium     Status: None   Collection Time: 10/02/16  7:32 PM  Result Value Ref Range   Magnesium 1.9 1.7 - 2.4 mg/dL  Glucose, capillary     Status: Abnormal   Collection Time: 10/02/16   8:11 PM  Result Value Ref Range   Glucose-Capillary 108 (H) 65 - 99 mg/dL  Magnesium     Status: None   Collection Time: 10/03/16 12:01 AM  Result Value Ref Range   Magnesium 1.8 1.7 - 2.4 mg/dL  Renal function     Status: Abnormal   Collection Time: 10/03/16 12:01 AM  Result Value Ref Range   Sodium 141 135 - 145 mmol/L   Potassium 3.6 3.5 - 5.1 mmol/L   Chloride 108 101 - 111 mmol/L   CO2 25 22 - 32 mmol/L   Glucose, Bld 98 65 - 99 mg/dL   BUN 30 (H) 6 - 20 mg/dL   Creatinine, Ser 1.81 (H) 0.44 - 1.00 mg/dL   Calcium 7.9 (L) 8.9 - 10.3 mg/dL   Phosphorus 2.5 2.5 - 4.6 mg/dL   Albumin 2.0 (L) 3.5 - 5.0 g/dL   GFR calc non Af Amer 33 (L) >60 mL/min   GFR calc Af Amer 38 (L) >60 mL/min    Comment: (NOTE) The eGFR has been calculated using the CKD EPI equation. This calculation has not been validated in all clinical situations. eGFR's persistently <60 mL/min signify possible Chronic Kidney Disease.    Anion gap 8 5 - 15  Glucose, capillary     Status: Abnormal   Collection Time: 10/03/16 12:26 AM  Result Value Ref Range   Glucose-Capillary 106 (H) 65 - 99 mg/dL  Magnesium     Status: None   Collection Time: 10/03/16  4:06 AM  Result Value Ref Range   Magnesium 1.8 1.7 - 2.4 mg/dL  Renal function     Status: Abnormal   Collection Time: 10/03/16  4:06 AM  Result Value Ref Range   Sodium 141 135 - 145 mmol/L   Potassium 3.5 3.5 - 5.1 mmol/L   Chloride 109 101 - 111 mmol/L   CO2 25 22 - 32 mmol/L   Glucose, Bld 105 (H) 65 - 99 mg/dL   BUN 27 (H) 6 - 20 mg/dL   Creatinine, Ser 1.58 (H) 0.44 - 1.00  mg/dL   Calcium 8.0 (L) 8.9 - 10.3 mg/dL   Phosphorus 2.7 2.5 - 4.6 mg/dL   Albumin 1.9 (L) 3.5 - 5.0 g/dL   GFR calc non Af Amer 39 (L) >60 mL/min   GFR calc Af Amer 45 (L) >60 mL/min    Comment: (NOTE) The eGFR has been calculated using the CKD EPI equation. This calculation has not been validated in all clinical situations. eGFR's persistently <60 mL/min signify possible  Chronic Kidney Disease.    Anion gap 7 5 - 15  CBC     Status: Abnormal   Collection Time: 10/03/16  4:06 AM  Result Value Ref Range   WBC 25.1 (H) 3.6 - 11.0 K/uL   RBC 2.98 (L) 3.80 - 5.20 MIL/uL   Hemoglobin 9.1 (L) 12.0 - 16.0 g/dL   HCT 27.4 (L) 35.0 - 47.0 %   MCV 91.8 80.0 - 100.0 fL   MCH 30.5 26.0 - 34.0 pg   MCHC 33.2 32.0 - 36.0 g/dL   RDW 14.6 (H) 11.5 - 14.5 %   Platelets 149 (L) 150 - 440 K/uL  APTT     Status: Abnormal   Collection Time: 10/03/16  4:06 AM  Result Value Ref Range   aPTT 38 (H) 24 - 36 seconds    Comment:        IF BASELINE aPTT IS ELEVATED, SUGGEST PATIENT RISK ASSESSMENT BE USED TO DETERMINE APPROPRIATE ANTICOAGULANT THERAPY.   Glucose, capillary     Status: Abnormal   Collection Time: 10/03/16  4:16 AM  Result Value Ref Range   Glucose-Capillary 102 (H) 65 - 99 mg/dL  Magnesium     Status: None   Collection Time: 10/03/16  7:38 AM  Result Value Ref Range   Magnesium 1.7 1.7 - 2.4 mg/dL  Renal function     Status: Abnormal   Collection Time: 10/03/16  7:38 AM  Result Value Ref Range   Sodium 141 135 - 145 mmol/L   Potassium 3.8 3.5 - 5.1 mmol/L   Chloride 109 101 - 111 mmol/L   CO2 26 22 - 32 mmol/L   Glucose, Bld 98 65 - 99 mg/dL   BUN 25 (H) 6 - 20 mg/dL   Creatinine, Ser 1.45 (H) 0.44 - 1.00 mg/dL   Calcium 7.9 (L) 8.9 - 10.3 mg/dL   Phosphorus 2.6 2.5 - 4.6 mg/dL   Albumin 1.9 (L) 3.5 - 5.0 g/dL   GFR calc non Af Amer 43 (L) >60 mL/min   GFR calc Af Amer 50 (L) >60 mL/min    Comment: (NOTE) The eGFR has been calculated using the CKD EPI equation. This calculation has not been validated in all clinical situations. eGFR's persistently <60 mL/min signify possible Chronic Kidney Disease.    Anion gap 6 5 - 15  Glucose, capillary     Status: None   Collection Time: 10/03/16  7:58 AM  Result Value Ref Range   Glucose-Capillary 91 65 - 99 mg/dL  Magnesium     Status: None   Collection Time: 10/03/16 12:50 PM  Result Value Ref  Range   Magnesium 1.8 1.7 - 2.4 mg/dL  Renal function     Status: Abnormal   Collection Time: 10/03/16 12:50 PM  Result Value Ref Range   Sodium 142 135 - 145 mmol/L   Potassium 3.9 3.5 - 5.1 mmol/L   Chloride 108 101 - 111 mmol/L   CO2 26 22 - 32 mmol/L   Glucose, Bld 83 65 -  99 mg/dL   BUN 27 (H) 6 - 20 mg/dL   Creatinine, Ser 1.56 (H) 0.44 - 1.00 mg/dL   Calcium 8.3 (L) 8.9 - 10.3 mg/dL   Phosphorus 2.0 (L) 2.5 - 4.6 mg/dL   Albumin 2.0 (L) 3.5 - 5.0 g/dL   GFR calc non Af Amer 39 (L) >60 mL/min   GFR calc Af Amer 45 (L) >60 mL/min    Comment: (NOTE) The eGFR has been calculated using the CKD EPI equation. This calculation has not been validated in all clinical situations. eGFR's persistently <60 mL/min signify possible Chronic Kidney Disease.    Anion gap 8 5 - 15  Glucose, capillary     Status: None   Collection Time: 10/03/16  1:01 PM  Result Value Ref Range   Glucose-Capillary 77 65 - 99 mg/dL  Magnesium     Status: None   Collection Time: 10/03/16  4:00 PM  Result Value Ref Range   Magnesium 1.7 1.7 - 2.4 mg/dL  Renal function     Status: Abnormal   Collection Time: 10/03/16  4:00 PM  Result Value Ref Range   Sodium 141 135 - 145 mmol/L   Potassium 3.5 3.5 - 5.1 mmol/L   Chloride 107 101 - 111 mmol/L   CO2 26 22 - 32 mmol/L   Glucose, Bld 106 (H) 65 - 99 mg/dL   BUN 25 (H) 6 - 20 mg/dL   Creatinine, Ser 1.40 (H) 0.44 - 1.00 mg/dL   Calcium 8.3 (L) 8.9 - 10.3 mg/dL   Phosphorus 1.4 (L) 2.5 - 4.6 mg/dL   Albumin 2.1 (L) 3.5 - 5.0 g/dL   GFR calc non Af Amer 45 (L) >60 mL/min   GFR calc Af Amer 52 (L) >60 mL/min    Comment: (NOTE) The eGFR has been calculated using the CKD EPI equation. This calculation has not been validated in all clinical situations. eGFR's persistently <60 mL/min signify possible Chronic Kidney Disease.    Anion gap 8 5 - 15  Glucose, capillary     Status: None   Collection Time: 10/03/16  4:14 PM  Result Value Ref Range    Glucose-Capillary 98 65 - 99 mg/dL  Magnesium     Status: None   Collection Time: 10/03/16  8:08 PM  Result Value Ref Range   Magnesium 1.8 1.7 - 2.4 mg/dL  Renal function     Status: Abnormal   Collection Time: 10/03/16  8:08 PM  Result Value Ref Range   Sodium 142 135 - 145 mmol/L   Potassium 3.6 3.5 - 5.1 mmol/L   Chloride 108 101 - 111 mmol/L   CO2 27 22 - 32 mmol/L   Glucose, Bld 104 (H) 65 - 99 mg/dL   BUN 21 (H) 6 - 20 mg/dL   Creatinine, Ser 1.22 (H) 0.44 - 1.00 mg/dL   Calcium 8.4 (L) 8.9 - 10.3 mg/dL   Phosphorus 1.8 (L) 2.5 - 4.6 mg/dL   Albumin 2.1 (L) 3.5 - 5.0 g/dL   GFR calc non Af Amer 53 (L) >60 mL/min   GFR calc Af Amer >60 >60 mL/min    Comment: (NOTE) The eGFR has been calculated using the CKD EPI equation. This calculation has not been validated in all clinical situations. eGFR's persistently <60 mL/min signify possible Chronic Kidney Disease.    Anion gap 7 5 - 15  Glucose, capillary     Status: Abnormal   Collection Time: 10/03/16  8:14 PM  Result Value Ref Range  Glucose-Capillary 133 (H) 65 - 99 mg/dL  Glucose, capillary     Status: None   Collection Time: 10/03/16 11:47 PM  Result Value Ref Range   Glucose-Capillary 99 65 - 99 mg/dL  Magnesium     Status: None   Collection Time: 10/04/16 12:20 AM  Result Value Ref Range   Magnesium 1.8 1.7 - 2.4 mg/dL  Renal function     Status: Abnormal   Collection Time: 10/04/16 12:20 AM  Result Value Ref Range   Sodium 141 135 - 145 mmol/L   Potassium 3.8 3.5 - 5.1 mmol/L   Chloride 107 101 - 111 mmol/L   CO2 28 22 - 32 mmol/L   Glucose, Bld 107 (H) 65 - 99 mg/dL   BUN 19 6 - 20 mg/dL   Creatinine, Ser 1.24 (H) 0.44 - 1.00 mg/dL   Calcium 8.4 (L) 8.9 - 10.3 mg/dL   Phosphorus 1.9 (L) 2.5 - 4.6 mg/dL   Albumin 2.3 (L) 3.5 - 5.0 g/dL   GFR calc non Af Amer 52 (L) >60 mL/min   GFR calc Af Amer 60 (L) >60 mL/min    Comment: (NOTE) The eGFR has been calculated using the CKD EPI equation. This  calculation has not been validated in all clinical situations. eGFR's persistently <60 mL/min signify possible Chronic Kidney Disease.    Anion gap 6 5 - 15  C difficile quick scan w PCR reflex     Status: None   Collection Time: 10/04/16 12:20 AM  Result Value Ref Range   C Diff antigen NEGATIVE NEGATIVE   C Diff toxin NEGATIVE NEGATIVE   C Diff interpretation No C. difficile detected.   Glucose, capillary     Status: Abnormal   Collection Time: 10/04/16  3:49 AM  Result Value Ref Range   Glucose-Capillary 106 (H) 65 - 99 mg/dL  Magnesium     Status: None   Collection Time: 10/04/16  4:23 AM  Result Value Ref Range   Magnesium 1.7 1.7 - 2.4 mg/dL  Renal function     Status: Abnormal   Collection Time: 10/04/16  4:23 AM  Result Value Ref Range   Sodium 141 135 - 145 mmol/L   Potassium 3.9 3.5 - 5.1 mmol/L   Chloride 107 101 - 111 mmol/L   CO2 29 22 - 32 mmol/L   Glucose, Bld 99 65 - 99 mg/dL   BUN 20 6 - 20 mg/dL   Creatinine, Ser 1.11 (H) 0.44 - 1.00 mg/dL   Calcium 8.4 (L) 8.9 - 10.3 mg/dL   Phosphorus 2.2 (L) 2.5 - 4.6 mg/dL   Albumin 2.4 (L) 3.5 - 5.0 g/dL   GFR calc non Af Amer 59 (L) >60 mL/min   GFR calc Af Amer >60 >60 mL/min    Comment: (NOTE) The eGFR has been calculated using the CKD EPI equation. This calculation has not been validated in all clinical situations. eGFR's persistently <60 mL/min signify possible Chronic Kidney Disease.    Anion gap 5 5 - 15  APTT     Status: Abnormal   Collection Time: 10/04/16  4:23 AM  Result Value Ref Range   aPTT 40 (H) 24 - 36 seconds    Comment:        IF BASELINE aPTT IS ELEVATED, SUGGEST PATIENT RISK ASSESSMENT BE USED TO DETERMINE APPROPRIATE ANTICOAGULANT THERAPY.   CBC     Status: Abnormal   Collection Time: 10/04/16  4:23 AM  Result Value Ref Range   WBC  24.8 (H) 3.6 - 11.0 K/uL   RBC 2.84 (L) 3.80 - 5.20 MIL/uL   Hemoglobin 8.8 (L) 12.0 - 16.0 g/dL   HCT 26.5 (L) 35.0 - 47.0 %   MCV 93.4 80.0 - 100.0 fL    MCH 31.1 26.0 - 34.0 pg   MCHC 33.3 32.0 - 36.0 g/dL   RDW 14.1 11.5 - 14.5 %   Platelets 146 (L) 150 - 440 K/uL  Glucose, capillary     Status: None   Collection Time: 10/04/16  7:32 AM  Result Value Ref Range   Glucose-Capillary 89 65 - 99 mg/dL  Magnesium     Status: None   Collection Time: 10/04/16  8:07 AM  Result Value Ref Range   Magnesium 1.7 1.7 - 2.4 mg/dL  Renal function     Status: Abnormal   Collection Time: 10/04/16  8:07 AM  Result Value Ref Range   Sodium 142 135 - 145 mmol/L   Potassium 4.0 3.5 - 5.1 mmol/L   Chloride 110 101 - 111 mmol/L   CO2 27 22 - 32 mmol/L   Glucose, Bld 92 65 - 99 mg/dL   BUN 18 6 - 20 mg/dL   Creatinine, Ser 1.16 (H) 0.44 - 1.00 mg/dL   Calcium 8.5 (L) 8.9 - 10.3 mg/dL   Phosphorus 1.9 (L) 2.5 - 4.6 mg/dL   Albumin 2.4 (L) 3.5 - 5.0 g/dL   GFR calc non Af Amer 56 (L) >60 mL/min   GFR calc Af Amer >60 >60 mL/min    Comment: (NOTE) The eGFR has been calculated using the CKD EPI equation. This calculation has not been validated in all clinical situations. eGFR's persistently <60 mL/min signify possible Chronic Kidney Disease.    Anion gap 5 5 - 15  Glucose, capillary     Status: None   Collection Time: 10/04/16 11:30 AM  Result Value Ref Range   Glucose-Capillary 97 65 - 99 mg/dL  Glucose, capillary     Status: Abnormal   Collection Time: 10/04/16  4:41 PM  Result Value Ref Range   Glucose-Capillary 108 (H) 65 - 99 mg/dL    Current Facility-Administered Medications  Medication Dose Route Frequency Provider Last Rate Last Dose  . 0.9 %  sodium chloride infusion  250 mL Intravenous PRN Flora Lipps, MD      . 0.9 %  sodium chloride infusion  250 mL Intravenous PRN Flora Lipps, MD 5 mL/hr at 10/04/16 1800 250 mL at 10/04/16 1800  . acetaminophen (TYLENOL) tablet 650 mg  650 mg Oral Q4H PRN Flora Lipps, MD   650 mg at 09/27/16 1805  . albumin human 25 % solution 12.5 g  12.5 g Intravenous Q8H Sarath Kolluru, MD   12.5 g at 10/04/16  1505  . chlorhexidine (PERIDEX) 0.12 % solution 15 mL  15 mL Mouth Rinse BID Flora Lipps, MD   15 mL at 10/04/16 1000  . citalopram (CELEXA) tablet 40 mg  40 mg Oral Daily Gonzella Lex, MD   40 mg at 10/04/16 0820  . cloZAPine (CLOZARIL) tablet 200 mg  200 mg Oral Daily Gonzella Lex, MD   200 mg at 10/04/16 1030  . dexmedetomidine (PRECEDEX) 400 MCG/100ML (4 mcg/mL) infusion  0.2-1.2 mcg/kg/hr Intravenous Titrated Mikael Spray, NP 20.1 mL/hr at 10/04/16 1606 0.6 mcg/kg/hr at 10/04/16 1606  . famotidine (PEPCID) IVPB 20 mg premix  20 mg Intravenous Q24H Sheema M Hallaji, RPH   20 mg at 10/04/16 1143  .  heparin injection 1,000-6,000 Units  1,000-6,000 Units CRRT PRN Munsoor Lateef, MD   2,800 Units at 10/04/16 0950  . heparin injection 5,000 Units  5,000 Units Subcutaneous Q8H Flora Lipps, MD   5,000 Units at 10/04/16 1505  . [START ON 10/05/2016] hydrocortisone sodium succinate (SOLU-CORTEF) 100 MG injection 25 mg  25 mg Intravenous Daily Laverle Hobby, MD      . LORazepam (ATIVAN) injection 2 mg  2 mg Intravenous Q2H PRN Flora Lipps, MD   2 mg at 10/04/16 1822  . LORazepam (ATIVAN) tablet 1 mg  1 mg Oral q morning - 10a Merilyn Baba, RPH   1 mg at 10/04/16 4431  . MEDLINE mouth rinse  15 mL Mouth Rinse q12n4p Flora Lipps, MD   15 mL at 10/03/16 1605  . ondansetron (ZOFRAN) injection 4 mg  4 mg Intravenous Q6H PRN Flora Lipps, MD      . piperacillin-tazobactam (ZOSYN) IVPB 3.375 g  3.375 g Intravenous Q8H Merilyn Baba, RPH   3.375 g at 10/04/16 1608  . sodium chloride flush (NS) 0.9 % injection 10-40 mL  10-40 mL Intracatheter Q12H Flora Lipps, MD   10 mL at 10/03/16 0921  . sodium chloride flush (NS) 0.9 % injection 3 mL  3 mL Intravenous Q12H Flora Lipps, MD   3 mL at 10/03/16 2135  . vasopressin (PITRESSIN) 40 Units in sodium chloride 0.9 % 250 mL (0.16 Units/mL) infusion  0.04 Units/min Intravenous Continuous Bincy S Varughese, NP 15 mL/hr at 10/04/16 1800 0.04 Units/min at  10/04/16 1800    Musculoskeletal: Strength & Muscle Tone: decreased Gait & Station: unable to stand Patient leans: Backward  Psychiatric Specialty Exam: Physical Exam  Nursing note and vitals reviewed. Constitutional: She appears well-developed and well-nourished. She appears distressed.  HENT:  Head: Normocephalic and atraumatic.  Eyes: Conjunctivae are normal. Pupils are equal, round, and reactive to light.  Neck: Normal range of motion.  Cardiovascular: Normal heart sounds.   Respiratory: She is in respiratory distress.  GI: Soft. She exhibits distension.  Musculoskeletal: Normal range of motion.  Neurological: She is alert.  Skin: Skin is warm and dry.  Psychiatric: Her affect is blunt. Her speech is delayed. She is slowed and withdrawn. Cognition and memory are impaired.    Review of Systems  Unable to perform ROS: Medical condition    Blood pressure 117/70, pulse 98, temperature 97.6 F (36.4 C), temperature source Oral, resp. rate (!) 21, height _0  (1.676 m), weight 127.6 kg (281 lb 4.9 oz), SpO2 96 %.Body mass index is 45.4 kg/m.  General Appearance: Disheveled  Eye Contact:  Fair  Speech:  Negative  Volume:  Decreased  Mood:  Negative  Affect:  Blunt and Flat  Thought Process:  NA  Orientation:  Negative  Thought Content:  Negative  Suicidal Thoughts:  No  Homicidal Thoughts:  No  Memory:  Negative  Judgement:  Negative  Insight:  Negative  Psychomotor Activity:  Negative  Concentration:  Concentration: Poor  Recall:  Poor  Fund of Knowledge:  Negative  Language:  Negative  Akathisia:  Negative  Handed:  Right  AIMS (if indicated):     Assets:  Financial Resources/Insurance Housing Social Support  ADL's:  Impaired  Cognition:  Impaired,  Moderate and Severe  Sleep:        Treatment Plan Summary: Daily contact with patient to assess and evaluate symptoms and progress in treatment, Medication management and Plan Spoke with nursing. Continue plan  to  get her back on her clozapine and Celexa. Plenty of when necessary medicine available. I will continue to follow up as needed.  Disposition: Patient does not meet criteria for psychiatric inpatient admission.  Alethia Berthold, MD 10/04/2016 6:27 PM

## 2016-10-04 NOTE — Progress Notes (Signed)
End of CRRT

## 2016-10-04 NOTE — Progress Notes (Signed)
[facility]  ROUNDING NOTE   Subjective:   CRRT. UOP 290 UF 1894 Off vaopressin   Objective:  Vital signs in last 24 hours:  Temp:  [96.1 F (35.6 C)-98.7 F (37.1 C)] 96.1 F (35.6 C) (12/05 0800) Pulse Rate:  [63-135] 104 (12/05 0830) Resp:  [10-25] 22 (12/05 0915) BP: (81-122)/(50-102) 99/61 (12/05 0915) SpO2:  [86 %-100 %] 95 % (12/05 0915) Weight:  [127.6 kg (281 lb 4.9 oz)] 127.6 kg (281 lb 4.9 oz) (12/05 0500)  Weight change: -6.4 kg (-14 lb 1.8 oz) Filed Weights   10/02/16 0500 10/03/16 0420 10/04/16 0500  Weight: 130.4 kg (287 lb 7.7 oz) 134 kg (295 lb 6.7 oz) 127.6 kg (281 lb 4.9 oz)    Intake/Output: I/O last 3 completed shifts: In: 1030.9 [I.V.:930.9; IV Piggyback:100] Out: [id number] [Urine:473; Other:2839]   Intake/Output this shift:  Total I/O In: 66.3 [I.V.:16.3; IV Piggyback:50] Out: 15 [Urine:15]  Physical Exam: General: No acute distress  Head: Normocephalic, atraumatic. OM moist  Eyes: Anicteric  Neck: Supple, trachea midline  Lungs:  Scattered rhonchi, normal effort   Heart: S1S2 No rubs   Abdomen:  Umbilical hernia +, BS present   Extremities: trace peripheral edema.  Neurologic: sedated  Skin: No lesions  Access: L IJ temporary dialysis catheter [id number]    Basic Metabolic Panel:  Recent Labs Lab 10/03/16 1600 10/03/16 2008 10/04/16 0020 10/04/16 0423 10/04/16 0807  NA 141 142 141 141 142  K 3.5 3.6 3.8 3.9 4.0  CL 107 108 107 107 110  CO2 26 27 28 29 27   GLUCOSE 106* 104* 107* 99 92  BUN 25* 21* 19 20 18   CREATININE 1.40* 1.22* 1.24* 1.11* 1.16*  CALCIUM 8.3* 8.4* 8.4* 8.4* 8.5*  MG 1.7 1.8 1.8 1.7 1.7  PHOS 1.4* 1.8* 1.9* 2.2* 1.9*    Liver Function Tests:  Recent Labs Lab 09/27/16 1109  10/03/16 1600 10/03/16 2008 10/04/16 0020 10/04/16 0423 10/04/16 0807  AST 52*  --   --   --   --   --   --   ALT 22  --   --   --   --   --   --   ALKPHOS 67  --   --   --   --   --   --   BILITOT 0.9  --   --   --    --   --   --   PROT 5.7*  --   --   --   --   --   --   ALBUMIN 2.6*  < > 2.1* 2.1* 2.3* 2.4* 2.4*  < > = values in this interval not displayed. No results for input(s): LIPASE, AMYLASE in the last 168 hours. No results for input(s): AMMONIA in the last 168 hours.  CBC:  Recent Labs Lab 09/27/16 1109  09/29/16 0508 09/30/16 0607 10/01/16 0500 10/02/16 0506 10/03/16 0406 10/04/16 0423  WBC 16.7*  < > 13.0* 8.6 7.6 13.7* 25.1* 24.8*  NEUTROABS 13.6*  --  11.1* 6.6*  --   --   --   --   HGB 11.8*  < > 11.1* 10.1* 9.7* 7.8* 9.1* 8.8*  HCT 35.6  < > 32.7* 29.7* 29.1* 23.0* 27.4* 26.5*  MCV 93.8  < > 91.8 91.5 91.7 91.7 91.8 93.4  PLT 283  < > 224 138* 128* 131* 149* 146*  < > = values in this interval not displayed.  Cardiac Enzymes: No  results for input(s): CKTOTAL, CKMB, CKMBINDEX, TROPONINI in the last 168 hours.  BNP: Invalid input(s): POCBNP  CBG:  Recent Labs Lab 10/03/16 1614 10/03/16 2014 10/03/16 2347 10/04/16 0349 10/04/16 0732  GLUCAP 98 133* 99 106* 89    Microbiology: Results for orders placed or performed during the hospital encounter of 09/27/16  Blood Culture (routine x 2)     Status: None   Collection Time: 09/27/16  6:02 AM  Result Value Ref Range Status   Specimen Description BLOOD  R AC  Final   Special Requests   Final    BOTTLES DRAWN AEROBIC AND ANAEROBIC  AER 3 ML ANA 4 ML   Culture NO GROWTH 5 DAYS  Final   Report Status 10/02/2016 FINAL  Final  Blood Culture (routine x 2)     Status: None   Collection Time: 09/27/16  6:03 AM  Result Value Ref Range Status   Specimen Description BLOOD  R ARM  Final   Special Requests   Final    BOTTLES DRAWN AEROBIC AND ANAEROBIC  AER 6 ML ANA 10 ML   Culture NO GROWTH 5 DAYS  Final   Report Status 10/02/2016 FINAL  Final  Urine culture     Status: None   Collection Time: 09/27/16  9:30 AM  Result Value Ref Range Status   Specimen Description URINE, RANDOM  Final   Special Requests NONE  Final    Culture NO GROWTH Performed at [facility]   Final   Report Status 09/28/2016 FINAL  Final  MRSA PCR Screening     Status: None   Collection Time: 09/27/16 11:09 AM  Result Value Ref Range Status   MRSA by PCR NEGATIVE NEGATIVE Final    Comment:        The GeneXpert MRSA Assay (FDA approved for NASAL specimens only), is one component of a comprehensive MRSA colonization surveillance program. It is not intended to diagnose MRSA infection nor to guide or monitor treatment for MRSA infections.   C difficile quick scan w PCR reflex     Status: None   Collection Time: 10/04/16 12:20 AM  Result Value Ref Range Status   C Diff antigen NEGATIVE NEGATIVE Final   C Diff toxin NEGATIVE NEGATIVE Final   C Diff interpretation No C. difficile detected.  Final    Coagulation Studies: No results for input(s): LABPROT, INR in the last 72 hours.  Urinalysis: No results for input(s): COLORURINE, LABSPEC, PHURINE, GLUCOSEU, HGBUR, BILIRUBINUR, KETONESUR, PROTEINUR, UROBILINOGEN, NITRITE, LEUKOCYTESUR in the last 72 hours.  Invalid input(s): APPERANCEUR    Imaging: No results found.   Medications:   . dexmedetomidine 0.5 mcg/kg/hr (10/04/16 0831)  . norepinephrine (LEVOPHED) Adult infusion Stopped (10/02/16 0030)  . phenylephrine (NEO-SYNEPHRINE) Adult infusion Stopped (09/30/16 0915)  . pureflow 2,000 mL/hr at 10/04/16 0552  . vasopressin (PITRESSIN) infusion - *FOR SHOCK* 0.04 Units/min (10/04/16 0935)   . albumin human  12.5 g Intravenous Q8H  . chlorhexidine  15 mL Mouth Rinse BID  . citalopram  40 mg Oral Daily  . cloZAPine  200 mg Oral QHS  . famotidine (PEPCID) IV  20 mg Intravenous Q24H  . heparin  5,000 Units Subcutaneous Q8H  . [START ON 10/05/2016] hydrocortisone sod succinate (SOLU-CORTEF) inj  25 mg Intravenous Daily  . LORazepam  1 mg Oral q morning - 10a  . mouth rinse  15 mL Mouth Rinse q12n4p  . piperacillin-tazobactam (ZOSYN)  IV  3.375 g Intravenous Q8H   .  sodium chloride flush  10-40 mL Intracatheter Q12H  . sodium chloride flush  3 mL Intravenous Q12H   sodium chloride, sodium chloride, acetaminophen, heparin, LORazepam, ondansetron (ZOFRAN) IV  Assessment/ Plan:  45 y.o.white female with a PMHx of Crohn's disease, depression, diabetes mellitus type 2, GERD, hiatal hernia, hyperlipidemia, hypertension, hypothyroidism, obesity, schizophrenia, traumatic brain injury, vitamin D deficiency, who was admitted to [facility] on 09/27/2016 for evaluation of acute respiratory failure.     1.  Acute renal failure with hyperkalemia and metabolic acidosis: anuric. No known baseline creatinine in last 3 years.  Acute renal failure sepsis, hypotension, and hypoxemia - Discontinue CRRT - Will monitor for daily dialysis  2.  Acute respiratory failure: vent dependent - solucortef - Appreciate pulmonary input  3. Sepsis: cultures negative - off vasopressin and other pressors - pip/tazo  4. Nutrition - IV albumin    LOS: [place], [place] 12/5/20179:36 AM

## 2016-10-04 NOTE — Progress Notes (Signed)
PULMONARY / CRITICAL CARE MEDICINE   Name: VERNESSA GAILES MRN: [id number] DOB: 15-Oct-1971    ADMISSION DATE:  09/27/2016   ASSESSMENT / PLAN:  45 yo obese white female admitted to the ICU for septic shock?UTI with severe metabolic acidosis, acute encephalopathy and acute respiratory failure;  Status post extubation. ARF on CRRT.    PULMONARY A: Acute respiratory failure--improved. Extubated 12/1 Currently on nasal cannula at 2 L. P:   Supplemental oxygen as needed to maintain SPO2 greater than 90 Continue Bronchodilator Decrease Steroids further. Continue optimal pulmonary toilet and monitor respiratory status closely Patient is at high risk for reintubation due to severe agitation and persistent encephalopathy.  CARDIOVASCULAR A:  Severe septic shock-blood pressure improved, pressors were weaned down to vasopressin  only, at 0.03 mics. P:  Continuous Telemetry Continue to wean pressors. Keep MAP goal>65 ECHO with normal LVEF of approximately 60%  RENAL A:   AKI possibly related to severe sepsis-now on CRRT, also started on albumin by nephrology. Hyperkalemia-resolved Hypocalcemia Hypophosphatemia P:   Nephrology following Replace electrolytes per ICU protocol CRRT per nephro.  Follow up repeat labs  GASTROINTESTINAL A:   No active issues P:   Bedside swallow eval, initiated on dysphagia 1, honey thick. Famotidine for GIP  HEMATOLOGIC A:   Leukocytosis, likely stress related. P:  Heparin for DVT prophylaxis Transfuse if Hgb<7 Follow cbc  INFECTIOUS A:   Severe septic shock P:   Continue broad spectrum antibiotics Follow cultures Monitor fever curve Follow CBC Procalcitonin-54.54>26.3  MICRO 11/28 MRSA negative.  11/28 Urine Cx; negative.  11/28 Blood Cx; negative.  ANTIBIOTICS: Vancomycin 11/28 >> 12/1 Piperacillin/tazo 11/28 >> , now day 8.  ENDOCRINE A:   No active issues, glucose controlled.  P:   Blood Glucose checks with  BMP   NEUROLOGIC/Psych A:   Acute encephalopathy, delirium related to severe sepsis. Severe agitation with underlying schizophrenia. -Psychiatry following. P:   RASS goal: N/A Minimize sedation Precedex infusion for agitation. Continue Zyprexa, Haldol and lorazepam as needed  SUBJECTIVE:  Extubated 09/30/2016. Awakens to voice and touch, and follow some basi commands. Currently on precedex, and the patient's agitation, appears controlled.   VITAL SIGNS: BP 102/81   Pulse 87   Temp 98.2 F (36.8 C) (Oral)   Resp 12   Ht 5\' 6"  (1.676 m)   Wt 281 lb 4.9 oz (127.6 kg)   SpO2 100%   BMI 45.40 kg/m   HEMODYNAMICS: CVP:  [8 mmHg-19 mmHg] 11 mmHg      INTAKE / OUTPUT: I/O last 3 completed shifts: In: 1014.6 [I.V.:914.6; IV Piggyback:100] Out: [id number] [Urine:473; Other:2839]  PHYSICAL EXAMINATION: General:  Morbidly obese, sickly appearing white female Neuro:  Opens eyes to voice, follow simple commands, moves extremities HEENT:  Atraumatic, normocephalic, no discharge, no JVD appreciated Cardiovascular: Normal sinus rhythm, S1S2, RRR, No MRG noted Lungs: Normal work of breathing, bilateral airflow with coarse bilaterally and rhonchi bilaterally, no wheezes Abdomen:  Firm, distended, active bowel sounds Musculoskeletal:  No inflammation/deformity noted Skin:  Grossly intact  LABS:  BMET  Recent Labs Lab 10/03/16 2008 10/04/16 0020 10/04/16 0423  NA 142 141 141  K 3.6 3.8 3.9  CL 108 107 107  CO2 27 28 29   BUN 21* 19 20  CREATININE 1.22* 1.24* 1.11*  GLUCOSE 104* 107* 99    Electrolytes  Recent Labs Lab 10/03/16 2008 10/04/16 0020 10/04/16 0423  CALCIUM 8.4* 8.4* 8.4*  MG 1.8 1.8 1.7  PHOS 1.8* 1.9* 2.2*  CBC  Recent Labs Lab 10/02/16 0506 10/03/16 0406 10/04/16 0423  WBC 13.7* 25.1* 24.8*  HGB 7.8* 9.1* 8.8*  HCT 23.0* 27.4* 26.5*  PLT 131* 149* 146*    Coag's  Recent Labs Lab 09/27/16 1109 10/03/16 0406 10/04/16 0423  APTT 40*  38* 40*  INR 1.25  --   --     Sepsis Markers  Recent Labs Lab 09/27/16 0908 09/27/16 1109 09/28/16 0301 09/29/16 0508  LATICACIDVEN 4.5* 2.1*  --   --   PROCALCITON  --  36.65 54.54 26.25    ABG  Recent Labs Lab 09/27/16 1109 09/28/16 0459  PHART 7.20* 7.24*  PCO2ART 34 36  PO2ART 104 99    Liver Enzymes  Recent Labs Lab 09/27/16 1109  10/03/16 2008 10/04/16 0020 10/04/16 0423  AST 52*  --   --   --   --   ALT 22  --   --   --   --   ALKPHOS 67  --   --   --   --   BILITOT 0.9  --   --   --   --   ALBUMIN 2.6*  < > 2.1* 2.3* 2.4*  < > = values in this interval not displayed.  Cardiac Enzymes No results for input(s): TROPONINI, PROBNP in the last 168 hours.  Glucose  Recent Labs Lab 10/03/16 0758 10/03/16 1301 10/03/16 1614 10/03/16 2014 10/03/16 2347 10/04/16 0349  GLUCAP 91 77 98 133* 99 106*    Imaging No results found.  -Marda Stalker, M.D.   Critical Care Attestation.  I have personally obtained a history, examined the patient, evaluated laboratory and imaging results, formulated the assessment and plan and placed orders. The Patient requires high complexity decision making for assessment and support, frequent evaluation and titration of therapies, application of advanced monitoring technologies and extensive interpretation of multiple databases. The patient has critical illness that could lead imminently to failure of 1 or more organ systems and requires the highest level of physician preparedness to intervene.  Critical Care Time devoted to patient care services described in this note is 35 minutes and is exclusive of time spent in procedures.

## 2016-10-05 LAB — GLUCOSE, CAPILLARY
GLUCOSE-CAPILLARY: 81 mg/dL (ref 65–99)
GLUCOSE-CAPILLARY: 102 mg/dL — AB (ref 65–99)
GLUCOSE-CAPILLARY: 80 mg/dL (ref 65–99)
Glucose-Capillary: 99 mg/dL (ref 65–99)
Glucose-Capillary: 115 mg/dL — ABNORMAL HIGH (ref 65–99)

## 2016-10-05 LAB — RENAL FUNCTION PANEL
ALBUMIN: 2.3 g/dL — AB (ref 3.5–5.0)
Anion gap: 7 (ref 5–15)
BUN: 27 mg/dL — AB (ref 6–20)
CHLORIDE: 108 mmol/L (ref 101–111)
CO2: 26 mmol/L (ref 22–32)
CREATININE: 2.17 mg/dL — AB (ref 0.44–1.00)
Calcium: 8 mg/dL — ABNORMAL LOW (ref 8.9–10.3)
GFR calc Af Amer: 30 mL/min — ABNORMAL LOW (ref >60)
GFR, EST NON AFRICAN AMERICAN: 26 mL/min — AB (ref >60)
Glucose, Bld: 94 mg/dL (ref 65–99)
Phosphorus: 3.2 mg/dL (ref 2.5–4.6)
Potassium: 3.2 mmol/L — ABNORMAL LOW (ref 3.5–5.1)
Sodium: 141 mmol/L (ref 135–145)

## 2016-10-05 LAB — CBC
HCT: 23.6 % — ABNORMAL LOW (ref 35.0–47.0)
Hemoglobin: 7.9 g/dL — ABNORMAL LOW (ref 12.0–16.0)
MCH: 30.8 pg (ref 26.0–34.0)
MCHC: 33.5 g/dL (ref 32.0–36.0)
MCV: 91.9 fL (ref 80.0–100.0)
PLATELETS: 150 10*3/uL (ref 150–440)
RBC: 2.57 MIL/uL — ABNORMAL LOW (ref 3.80–5.20)
RDW: 14.4 % (ref 11.5–14.5)
WBC: 19.1 10*3/uL — AB (ref 3.6–11.0)

## 2016-10-05 LAB — MAGNESIUM: MAGNESIUM: 1.7 mg/dL (ref 1.7–2.4)

## 2016-10-05 MED ORDER — PIPERACILLIN-TAZOBACTAM 3.375 G IVPB
3.3750 g | Freq: Two times a day (BID) | INTRAVENOUS | Status: AC
  Administered 2016-10-05 – 2016-10-06 (×4): 3.375 g via INTRAVENOUS
  Filled 2016-10-05 (×4): qty 50

## 2016-10-05 MED ORDER — LORAZEPAM 2 MG/ML IJ SOLN
2.0000 mg | Freq: Four times a day (QID) | INTRAMUSCULAR | Status: DC
  Administered 2016-10-05 – 2016-10-08 (×12): 2 mg via INTRAVENOUS
  Filled 2016-10-05 (×13): qty 1

## 2016-10-05 MED ORDER — OLANZAPINE 5 MG PO TBDP
10.0000 mg | ORAL_TABLET | Freq: Two times a day (BID) | ORAL | Status: DC
  Administered 2016-10-05 – 2016-10-18 (×21): 10 mg via ORAL
  Filled 2016-10-05 (×23): qty 2

## 2016-10-05 MED ORDER — LORAZEPAM 2 MG PO TABS
2.0000 mg | ORAL_TABLET | Freq: Two times a day (BID) | ORAL | Status: DC
  Administered 2016-10-05: 2 mg via ORAL
  Filled 2016-10-05: qty 1

## 2016-10-05 MED ORDER — POTASSIUM CHLORIDE CRYS ER 20 MEQ PO TBCR
20.0000 meq | EXTENDED_RELEASE_TABLET | Freq: Once | ORAL | Status: AC
  Administered 2016-10-05: 20 meq via ORAL

## 2016-10-05 MED ORDER — HALOPERIDOL LACTATE 5 MG/ML IJ SOLN: 5.0000 mg | Freq: Once | INTRAMUSCULAR | Status: DC

## 2016-10-05 NOTE — Progress Notes (Signed)
Pharmacy Antibiotic   [name] is a 45 y.o. female admitted on 09/27/2016 with sepsis from unknown source with severe metabolic acidosis and acute encephalopathy. Pharmacy has been consulted for piperacillin/tazobactam dosing and to dose adjust medications based on CRRT. CRRT began 11/29, Was stopped 12/2, and then restarted 12/3 and has now been discontinued again as of 12/5.   Plan: Antibiotics: Will dose Zosyn as if patient receiving hemodialysis. Will change Zosyn from q8 hour dosing to q12 hours.    Will continue famotidine every 24 hours   Height: 5\' 6"  (167.6 cm) Weight: 282 lb 3 oz (128 kg) IBW/kg (Calculated) : 59.3  Temp (24hrs), Avg:97.6 F (36.4 C), Min:96.1 F (35.6 C), Max:98.2 F (36.8 C)   Recent Labs Lab 10/01/16 0500  10/02/16 0506  10/03/16 0406  10/03/16 2008 10/04/16 0020 10/04/16 0423 10/04/16 0807 10/05/16 0427  WBC 7.6  --  13.7*  --  25.1*  --   --   --  24.8*  --  19.1*  CREATININE 1.33*  < > 1.88*  < > 1.58*  < > 1.22* 1.24* 1.11* 1.16* 2.17*  < > = values in this interval not displayed.  Estimated Creatinine Clearance: 44.9 mL/min (by C-G formula based on SCr of 2.17 mg/dL (H)).    Allergies  Allergen Reactions  . Peanut Oil Other (See Comments)    Face turns red  . Risperidone And Related Cough    Antimicrobials this admission: Vancomycin 11/28 >> 12/1 Piperacillin/tazo 11/28 >>   Dose adjustments this admission:   Microbiology results: 11/28 BCx: NG x 5 days 11/28 UCx: No growth but collected after antibiotic administration 11/28 MRSA PCR: negative  Thank you for allowing pharmacy to be a part of this patient's care.  Larene Beach, PharmD  Clinical Pharmacist 10/05/2016 7:44 AM

## 2016-10-05 NOTE — Progress Notes (Signed)
Nutrition Follow-up  DOCUMENTATION CODES:   Morbid obesity  INTERVENTION:  -Feeding assistance is necessary at meal times. Pt will eat if encouraged and fed at this time. Even if pt says she is not hungry, she will eat if offered food at this time -[employer] Cup and HONEY Mighty Shakes   NUTRITION DIAGNOSIS:   Inadequate oral intake related to acute illness as evidenced by NPO status.  Being addressed via feeding assistance, supplements  GOAL:   Patient will meet greater than or equal to 90% of their needs  MONITOR:   Labs, Weight trends, I & O's (Nutrition poc)  REASON FOR ASSESSMENT:   Ventilator    ASSESSMENT:   45 yo female admitted with septic shock with severe metabolic acidosis, acute encephalopathy and acute respiratory failure  Pt off CRRT, holding off on intermittent HD at present Pt yelling out "Help me, Help me;" pt wanting to go home Pt not eaten breakfast on visit this AM, but with help of RN pt ate 2 Magic Cup supplements on visit today  Diet Order:  DIET - DYS 1 Room service appropriate? No; Fluid consistency: Honey Thick  Skin:   (stage I pressure ulcers)  Last BM:  strong foul smelling liquid stool via rectal tube, about 400 mL in bag on visit today  Height:   Ht Readings from Last 1 Encounters:  09/27/16 5\' 6"  (1.676 m)    Weight:   Wt Readings from Last 1 Encounters:  10/05/16 282 lb 3 oz (128 kg)    Filed Weights   10/03/16 0420 10/04/16 0500 10/05/16 0500  Weight: 295 lb 6.7 oz (134 kg) 281 lb 4.9 oz (127.6 kg) 282 lb 3 oz (128 kg)    BMI:  Body mass index is 45.55 kg/m.  Estimated Nutritional Needs:   Kcal:  2200-2500 kcals  Protein:  >/= 120 g  Fluid:  >/= 1.7 L  EDUCATION NEEDS:   No education needs identified at this time  [place], [place], [place] [phone] Pager  [phone] Weekend/On-Call Pager

## 2016-10-05 NOTE — Progress Notes (Signed)
MEDICATION RELATED CONSULT NOTE - INITIAL   Pharmacy Consult for Clozapine monitoring  Indication: clozapine   Allergies  Allergen Reactions  . Peanut Oil Other (See Comments)    Face turns red  . Risperidone And Related Cough    Patient Measurements: Height: 5\' 6"  (167.6 cm) Weight: 282 lb 3 oz (128 kg) IBW/kg (Calculated) : 59.3 Adjusted Body Weight:   Vital Signs: Temp: 98.2 F (36.8 C) (12/06 0500) Temp Source: Oral (12/06 0500) BP: 104/75 (12/06 0730) Pulse Rate: 66 (12/06 0730) Intake/Output from previous day: 12/05 0701 - 12/06 0700 In: 1108.2 [I.V.:398.2; IV Piggyback:710] Out: 693 [Urine:430] Intake/Output from this shift: No intake/output data recorded.  Labs:  Recent Labs  10/03/16 0406  10/04/16 0423 10/04/16 0807 10/05/16 0427  WBC 25.1*  --  24.8*  --  19.1*  HGB 9.1*  --  8.8*  --  7.9*  HCT 27.4*  --  26.5*  --  23.6*  PLT 149*  --  146*  --  150  APTT 38*  --  40*  --   --   CREATININE 1.58*  < > 1.11* 1.16* 2.17*  MG 1.8  < > 1.7 1.7 1.7  PHOS 2.7  < > 2.2* 1.9* 3.2  ALBUMIN 1.9*  < > 2.4* 2.4* 2.3*  < > = values in this interval not displayed. Estimated Creatinine Clearance: 44.9 mL/min (by C-G formula based on SCr of 2.17 mg/dL (H)).   Microbiology: Recent Results (from the past 720 hour(s))  Blood Culture (routine x 2)     Status: None   Collection Time: 09/27/16  6:02 AM  Result Value Ref Range Status   Specimen Description BLOOD  R AC  Final   Special Requests   Final    BOTTLES DRAWN AEROBIC AND ANAEROBIC  AER 3 ML ANA 4 ML   Culture NO GROWTH 5 DAYS  Final   Report Status 10/02/2016 FINAL  Final  Blood Culture (routine x 2)     Status: None   Collection Time: 09/27/16  6:03 AM  Result Value Ref Range Status   Specimen Description BLOOD  R ARM  Final   Special Requests   Final    BOTTLES DRAWN AEROBIC AND ANAEROBIC  AER 6 ML ANA 10 ML   Culture NO GROWTH 5 DAYS  Final   Report Status 10/02/2016 FINAL  Final  Urine culture      Status: None   Collection Time: 09/27/16  9:30 AM  Result Value Ref Range Status   Specimen Description URINE, RANDOM  Final   Special Requests NONE  Final   Culture NO GROWTH Performed at [facility]   Final   Report Status 09/28/2016 FINAL  Final  MRSA PCR Screening     Status: None   Collection Time: 09/27/16 11:09 AM  Result Value Ref Range Status   MRSA by PCR NEGATIVE NEGATIVE Final    Comment:        The GeneXpert MRSA Assay (FDA approved for NASAL specimens only), is one component of a comprehensive MRSA colonization surveillance program. It is not intended to diagnose MRSA infection nor to guide or monitor treatment for MRSA infections.   C difficile quick scan w PCR reflex     Status: None   Collection Time: 10/04/16 12:20 AM  Result Value Ref Range Status   C Diff antigen NEGATIVE NEGATIVE Final   C Diff toxin NEGATIVE NEGATIVE Final   C Diff interpretation No C. difficile detected.  Final    Medical History: Past Medical History:  Diagnosis Date  . Abnormal mammogram, unspecified 2013   Prev. cytology,hypercellular smears without evidence of malignant cells. The cytopathologist questioned if samples truly representative. Care taken during sampling and is felt to be representative.  . Breast screening, unspecified 2013  . Broken leg    age 75  . Cellulitis   . Crohn's disease ([place]) 2013  . Depression   . Diabetes mellitus without complication (HCC)    non insulin dependent  . Early menopause   . Edema   . GERD (gastroesophageal reflux disease)   . Hiatal hernia 2013  . Hyperlipidemia   . Hypertension   . Hypothyroidism   . 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 Obesity, unspecified 2013  . Other sign and symptom in breast 2013   Right bst US,lower outer quadrant,A single 0.3x0.4x0.6cm hypoechoic mass with slightly lobulated borders with adjacent 0.3x0.4x0.5cm mass was noted. The 1st was 5cm from nipple, 2nd at 8 cm  from the nipple. The previous lesion aspirate was at 3 0'clock position. These lesions are thought to account for the mammographic abnormality. Minimal interval change on [place].   [facility])   . Rib fracture    age 54  . Schizophrenia ([place])   . TBI (traumatic brain injury) ([place])   . Thyroid disease    hypothyroid  . Vitamin D deficiency     Medications:  Prescriptions Prior to Admission  Medication Sig Dispense Refill Last Dose  . acetaminophen (TYLENOL) 325 MG tablet Take 650 mg by mouth every 6 (six) hours as needed for mild pain. Every 4 to 6 hours PRN   PRN at PRN  . albuterol (PROVENTIL HFA;VENTOLIN HFA) 108 (90 Base) MCG/ACT inhaler Inhale 1 puff into the lungs 4 (four) times daily as needed for wheezing or shortness of breath.   PRN at PRN  . calcium carbonate (OSCAL) 1500 (600 Ca) MG TABS tablet Take 600 mg of elemental calcium by mouth 2 (two) times daily with a meal.   09/26/2016 at 1700  . Cholecalciferol 5000 units TABS Take 5,000 Units by mouth daily.   09/26/2016 at 0800  . citalopram (CELEXA) 40 MG tablet Take 40 mg by mouth daily.   09/26/2016 at 0800  . cloZAPine (CLOZARIL) 100 MG tablet Take 200 mg by mouth at bedtime.    09/25/2016 at 2000  . diphenhydrAMINE (BENADRYL) 50 MG capsule Take 50 mg by mouth at bedtime.   09/26/2016 at 2000  . furosemide (LASIX) 40 MG tablet Take 40 mg by mouth daily.   09/26/2016 at 0800  . gemfibrozil (LOPID) 600 MG tablet Take 600 mg by mouth 2 (two) times daily.   09/26/2016 at 2000  . levothyroxine (SYNTHROID, LEVOTHROID) 25 MCG tablet Take 25 mcg by mouth every morning.   09/26/2016 at 0700  . lisinopril (PRINIVIL,ZESTRIL) 5 MG tablet Take 5 mg by mouth daily.   09/26/2016 at 0800  . LORazepam (ATIVAN) 0.5 MG tablet Take 0.5 mg by mouth every 4 (four) hours as needed for anxiety.   PRN at PRN  . LORazepam (ATIVAN) 1 MG tablet Take 1 mg by mouth every morning.   09/26/2016 at 0800  . mesalamine (APRISO) 0.375 g 24 hr capsule  Take 1,500 mg by mouth every morning.   09/26/2016 at 0800  . metFORMIN (GLUCOPHAGE) 500 MG tablet Take 500 mg by mouth daily.   09/26/2016 at 0800  .  omega-3 acid ethyl esters (LOVAZA) 1 g capsule Take 2 g by mouth every morning.    09/26/2016 at 0800  . pantoprazole (PROTONIX) 40 MG tablet Take 40 mg by mouth daily.    09/26/2016 at 0700  . potassium chloride (K-DUR) 10 MEQ tablet Take 20 mEq by mouth 2 (two) times daily.   09/26/2016 at 1700  . pseudoephedrine-guaifenesin (MUCINEX D) 60-600 MG 12 hr tablet Take 2 tablets by mouth every 12 (twelve) hours as needed for congestion.    PRN at PRN  . risperiDONE (RISPERDAL M-TAB) 1 MG disintegrating tablet Take 1 mg by mouth 2 (two) times daily.   09/26/2016 at 2000  . simvastatin (ZOCOR) 20 MG tablet Take 20 mg by mouth at bedtime.   09/26/2016 at 2000  . spironolactone (ALDACTONE) 25 MG tablet Take 50 mg by mouth daily.   09/26/2016 at 0800  . vitamin B-12 (CYANOCOBALAMIN) 1000 MCG tablet Take 1,000 mcg by mouth daily.   09/26/2016 at 0800   Scheduled:  . albumin human  12.5 g Intravenous Q8H  . chlorhexidine  15 mL Mouth Rinse BID  . citalopram  40 mg Oral Daily  . cloZAPine  200 mg Oral Daily  . famotidine (PEPCID) IV  20 mg Intravenous Q24H  . haloperidol lactate  5 mg Intravenous Once  . heparin  5,000 Units Subcutaneous Q8H  . hydrocortisone sod succinate (SOLU-CORTEF) inj  25 mg Intravenous Daily  . LORazepam  1 mg Oral q morning - 10a  . mouth rinse  15 mL Mouth Rinse q12n4p  . piperacillin-tazobactam (ZOSYN)  IV  3.375 g Intravenous Q12H  . sodium chloride flush  10-40 mL Intracatheter Q12H  . sodium chloride flush  3 mL Intravenous Q12H   Infusions:  . dexmedetomidine 0.4 mcg/kg/hr (10/05/16 0748)  . vasopressin (PITRESSIN) infusion - *FOR SHOCK* 0.03 Units/min (10/04/16 2230)    Assessment: Pharmacy consulted to monitor [place] in this patient receiving clozapine.     Goal of Therapy:    Plan:  Last ANC on 12/1 was 6.6.  Will continue and will recheck [place] level on 12/8.     Mikhala Kenan D 10/05/2016,7:52 AM

## 2016-10-05 NOTE — Consult Note (Signed)
[facility] Consult   Reason for Consult:  Follow-up note for this 45 year old woman with schizophrenia currently still in the intensive care unit recovering from sepsis Referring Physician:  Mortimer Fries Patient Identification: [name] MRN:  [id number] Principal Diagnosis: Schizophrenia [facility]) Diagnosis:   Patient Active Problem List   Diagnosis Date Noted  . Pressure injury of skin [L89.90] 10/02/2016  . Schizophrenia ([place]) [F20.9] 09/30/2016  . Acute renal failure (ARF) ([place]) [N17.9]   . Respiratory failure ([place]) [J96.90] 09/27/2016  . Other sign and symptom in breast [N64.59]     Total Time spent with patient: 30 minutes  Subjective:   [name] is a 45 y.o. female patient admitted with "I want to get out of this place".  HPI:  See previous notes for earlier details. 45 year old woman with schizophrenia currently is in the intensive care unit recovering from sepsis with multiorgan failure. She has been extubated and now is off of the Precedex drip. She is still receiving dialysis intermittently, has been incontinent of stool with a rectal tube still in place, and mentally remains very confused. She was able to respond to very simple questions but was disoriented to situation. Mostly was crying out and stating she wanted to leave and showing no understanding of her current situation. At times has appeared to be possibly responding to internal stimuli. Nursing reports that she has still been quite agitated off of the Precedex and they have needed to give her multiple doses of Ativan with only partial improvement.  Past Psychiatric History: Long-standing history of schizophrenia on clozapine and other medications.  Risk to Self:   Risk to Others:   Prior Inpatient Therapy:   Prior Outpatient Therapy:    Past Medical History:  Past Medical History:  Diagnosis Date  . Abnormal mammogram, unspecified 2013   Prev. cytology,hypercellular smears without evidence of  malignant cells. The cytopathologist questioned if samples truly representative. Care taken during sampling and is felt to be representative.  . Breast screening, unspecified 2013  . Broken leg    age 1  . Cellulitis   . Crohn's disease ([place]) 2013  . Depression   . Diabetes mellitus without complication (HCC)    non insulin dependent  . Early menopause   . Edema   . GERD (gastroesophageal reflux disease)   . Hiatal hernia 2013  . Hyperlipidemia   . Hypertension   . Hypothyroidism   . 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 Obesity, unspecified 2013  . Other sign and symptom in breast 2013   Right bst US,lower outer quadrant,A single 0.3x0.4x0.6cm hypoechoic mass with slightly lobulated borders with adjacent 0.3x0.4x0.5cm mass was noted. The 1st was 5cm from nipple, 2nd at 8 cm from the nipple. The previous lesion aspirate was at 3 0'clock position. These lesions are thought to account for the mammographic abnormality. Minimal interval change on [place].   [facility])   . Rib fracture    age 89  . Schizophrenia ([place])   . TBI (traumatic brain injury) ([place])   . Thyroid disease    hypothyroid  . Vitamin D deficiency     Past Surgical History:  Procedure Laterality Date  . COLONOSCOPY WITH PROPOFOL N/A 02/16/2016   Procedure: COLONOSCOPY WITH PROPOFOL;  Surgeon: Lollie Sails, MD;  Location: [facility] ENDOSCOPY;  Service: Endoscopy;  Laterality: N/A;  . COLONOSCOPY WITH PROPOFOL N/A 02/22/2016   Procedure: COLONOSCOPY WITH PROPOFOL;  Surgeon: Billie Ruddy  Gustavo Lah, MD;  Location: [facility] ENDOSCOPY;  Service: Endoscopy;  Laterality: N/A;  . EYE SURGERY Left 2013   cataract surgery  . TUMOR EXCISION Right    age of 62, tumor removed from RUE   Family History:  Family History  Problem Relation Age of Onset  . Cancer Mother 34    colon  . Cancer Paternal Aunt    Family Psychiatric  History: None identified Social History:  History  Alcohol Use No      History  Drug Use No    Social History   Social History  . Marital status: Single    Spouse name: N/A  . Number of children: N/A  . Years of education: N/A   Social History Main Topics  . Smoking status: Never Smoker  . Smokeless tobacco: Never Used  . Alcohol use No  . Drug use: No  . Sexual activity: Not Asked   Other Topics Concern  . None   Social History Narrative  . None   Additional Social History:    Allergies:   Allergies  Allergen Reactions  . Peanut Oil Other (See Comments)    Face turns red  . Risperidone And Related Cough    Labs:  Results for orders placed or performed during the hospital encounter of 09/27/16 (from the past 48 hour(s))  Magnesium     Status: None   Collection Time: 10/03/16  4:00 PM  Result Value Ref Range   Magnesium 1.7 1.7 - 2.4 mg/dL  Renal function     Status: Abnormal   Collection Time: 10/03/16  4:00 PM  Result Value Ref Range   Sodium 141 135 - 145 mmol/L   Potassium 3.5 3.5 - 5.1 mmol/L   Chloride 107 101 - 111 mmol/L   CO2 26 22 - 32 mmol/L   Glucose, Bld 106 (H) 65 - 99 mg/dL   BUN 25 (H) 6 - 20 mg/dL   Creatinine, Ser 1.40 (H) 0.44 - 1.00 mg/dL   Calcium 8.3 (L) 8.9 - 10.3 mg/dL   Phosphorus 1.4 (L) 2.5 - 4.6 mg/dL   Albumin 2.1 (L) 3.5 - 5.0 g/dL   GFR calc non Af Amer 45 (L) >60 mL/min   GFR calc Af Amer 52 (L) >60 mL/min    Comment: (NOTE) The eGFR has been calculated using the CKD EPI equation. This calculation has not been validated in all clinical situations. eGFR's persistently <60 mL/min signify possible Chronic Kidney Disease.    Anion gap 8 5 - 15  Glucose, capillary     Status: None   Collection Time: 10/03/16  4:14 PM  Result Value Ref Range   Glucose-Capillary 98 65 - 99 mg/dL  Magnesium     Status: None   Collection Time: 10/03/16  8:08 PM  Result Value Ref Range   Magnesium 1.8 1.7 - 2.4 mg/dL  Renal function     Status: Abnormal   Collection Time: 10/03/16  8:08 PM  Result Value Ref  Range   Sodium 142 135 - 145 mmol/L   Potassium 3.6 3.5 - 5.1 mmol/L   Chloride 108 101 - 111 mmol/L   CO2 27 22 - 32 mmol/L   Glucose, Bld 104 (H) 65 - 99 mg/dL   BUN 21 (H) 6 - 20 mg/dL   Creatinine, Ser 1.22 (H) 0.44 - 1.00 mg/dL   Calcium 8.4 (L) 8.9 - 10.3 mg/dL   Phosphorus 1.8 (L) 2.5 - 4.6 mg/dL   Albumin 2.1 (L) 3.5 -  5.0 g/dL   GFR calc non Af Amer 53 (L) >60 mL/min   GFR calc Af Amer >60 >60 mL/min    Comment: (NOTE) The eGFR has been calculated using the CKD EPI equation. This calculation has not been validated in all clinical situations. eGFR's persistently <60 mL/min signify possible Chronic Kidney Disease.    Anion gap 7 5 - 15  Glucose, capillary     Status: Abnormal   Collection Time: 10/03/16  8:14 PM  Result Value Ref Range   Glucose-Capillary 133 (H) 65 - 99 mg/dL  Glucose, capillary     Status: None   Collection Time: 10/03/16 11:47 PM  Result Value Ref Range   Glucose-Capillary 99 65 - 99 mg/dL  Magnesium     Status: None   Collection Time: 10/04/16 12:20 AM  Result Value Ref Range   Magnesium 1.8 1.7 - 2.4 mg/dL  Renal function     Status: Abnormal   Collection Time: 10/04/16 12:20 AM  Result Value Ref Range   Sodium 141 135 - 145 mmol/L   Potassium 3.8 3.5 - 5.1 mmol/L   Chloride 107 101 - 111 mmol/L   CO2 28 22 - 32 mmol/L   Glucose, Bld 107 (H) 65 - 99 mg/dL   BUN 19 6 - 20 mg/dL   Creatinine, Ser 1.24 (H) 0.44 - 1.00 mg/dL   Calcium 8.4 (L) 8.9 - 10.3 mg/dL   Phosphorus 1.9 (L) 2.5 - 4.6 mg/dL   Albumin 2.3 (L) 3.5 - 5.0 g/dL   GFR calc non Af Amer 52 (L) >60 mL/min   GFR calc Af Amer 60 (L) >60 mL/min    Comment: (NOTE) The eGFR has been calculated using the CKD EPI equation. This calculation has not been validated in all clinical situations. eGFR's persistently <60 mL/min signify possible Chronic Kidney Disease.    Anion gap 6 5 - 15  C difficile quick scan w PCR reflex     Status: None   Collection Time: 10/04/16 12:20 AM  Result  Value Ref Range   C Diff antigen NEGATIVE NEGATIVE   C Diff toxin NEGATIVE NEGATIVE   C Diff interpretation No C. difficile detected.   Glucose, capillary     Status: Abnormal   Collection Time: 10/04/16  3:49 AM  Result Value Ref Range   Glucose-Capillary 106 (H) 65 - 99 mg/dL  Magnesium     Status: None   Collection Time: 10/04/16  4:23 AM  Result Value Ref Range   Magnesium 1.7 1.7 - 2.4 mg/dL  Renal function     Status: Abnormal   Collection Time: 10/04/16  4:23 AM  Result Value Ref Range   Sodium 141 135 - 145 mmol/L   Potassium 3.9 3.5 - 5.1 mmol/L   Chloride 107 101 - 111 mmol/L   CO2 29 22 - 32 mmol/L   Glucose, Bld 99 65 - 99 mg/dL   BUN 20 6 - 20 mg/dL   Creatinine, Ser 1.11 (H) 0.44 - 1.00 mg/dL   Calcium 8.4 (L) 8.9 - 10.3 mg/dL   Phosphorus 2.2 (L) 2.5 - 4.6 mg/dL   Albumin 2.4 (L) 3.5 - 5.0 g/dL   GFR calc non Af Amer 59 (L) >60 mL/min   GFR calc Af Amer >60 >60 mL/min    Comment: (NOTE) The eGFR has been calculated using the CKD EPI equation. This calculation has not been validated in all clinical situations. eGFR's persistently <60 mL/min signify possible Chronic Kidney Disease.  Anion gap 5 5 - 15  APTT     Status: Abnormal   Collection Time: 10/04/16  4:23 AM  Result Value Ref Range   aPTT 40 (H) 24 - 36 seconds    Comment:        IF BASELINE aPTT IS ELEVATED, SUGGEST PATIENT RISK ASSESSMENT BE USED TO DETERMINE APPROPRIATE ANTICOAGULANT THERAPY.   CBC     Status: Abnormal   Collection Time: 10/04/16  4:23 AM  Result Value Ref Range   WBC 24.8 (H) 3.6 - 11.0 K/uL   RBC 2.84 (L) 3.80 - 5.20 MIL/uL   Hemoglobin 8.8 (L) 12.0 - 16.0 g/dL   HCT 26.5 (L) 35.0 - 47.0 %   MCV 93.4 80.0 - 100.0 fL   MCH 31.1 26.0 - 34.0 pg   MCHC 33.3 32.0 - 36.0 g/dL   RDW 14.1 11.5 - 14.5 %   Platelets 146 (L) 150 - 440 K/uL  Glucose, capillary     Status: None   Collection Time: 10/04/16  7:32 AM  Result Value Ref Range   Glucose-Capillary 89 65 - 99 mg/dL   Magnesium     Status: None   Collection Time: 10/04/16  8:07 AM  Result Value Ref Range   Magnesium 1.7 1.7 - 2.4 mg/dL  Renal function     Status: Abnormal   Collection Time: 10/04/16  8:07 AM  Result Value Ref Range   Sodium 142 135 - 145 mmol/L   Potassium 4.0 3.5 - 5.1 mmol/L   Chloride 110 101 - 111 mmol/L   CO2 27 22 - 32 mmol/L   Glucose, Bld 92 65 - 99 mg/dL   BUN 18 6 - 20 mg/dL   Creatinine, Ser 1.16 (H) 0.44 - 1.00 mg/dL   Calcium 8.5 (L) 8.9 - 10.3 mg/dL   Phosphorus 1.9 (L) 2.5 - 4.6 mg/dL   Albumin 2.4 (L) 3.5 - 5.0 g/dL   GFR calc non Af Amer 56 (L) >60 mL/min   GFR calc Af Amer >60 >60 mL/min    Comment: (NOTE) The eGFR has been calculated using the CKD EPI equation. This calculation has not been validated in all clinical situations. eGFR's persistently <60 mL/min signify possible Chronic Kidney Disease.    Anion gap 5 5 - 15  Glucose, capillary     Status: None   Collection Time: 10/04/16 11:30 AM  Result Value Ref Range   Glucose-Capillary 97 65 - 99 mg/dL  Glucose, capillary     Status: Abnormal   Collection Time: 10/04/16  4:41 PM  Result Value Ref Range   Glucose-Capillary 108 (H) 65 - 99 mg/dL  Glucose, capillary     Status: None   Collection Time: 10/04/16  8:04 PM  Result Value Ref Range   Glucose-Capillary 88 65 - 99 mg/dL  Glucose, capillary     Status: None   Collection Time: 10/04/16 11:42 PM  Result Value Ref Range   Glucose-Capillary 97 65 - 99 mg/dL  CBC     Status: Abnormal   Collection Time: 10/05/16  4:27 AM  Result Value Ref Range   WBC 19.1 (H) 3.6 - 11.0 K/uL   RBC 2.57 (L) 3.80 - 5.20 MIL/uL   Hemoglobin 7.9 (L) 12.0 - 16.0 g/dL   HCT 23.6 (L) 35.0 - 47.0 %   MCV 91.9 80.0 - 100.0 fL   MCH 30.8 26.0 - 34.0 pg   MCHC 33.5 32.0 - 36.0 g/dL   RDW 14.4 11.5 - 14.5 %  Platelets 150 150 - 440 K/uL  Renal function panel     Status: Abnormal   Collection Time: 10/05/16  4:27 AM  Result Value Ref Range   Sodium 141 135 - 145  mmol/L   Potassium 3.2 (L) 3.5 - 5.1 mmol/L   Chloride 108 101 - 111 mmol/L   CO2 26 22 - 32 mmol/L   Glucose, Bld 94 65 - 99 mg/dL   BUN 27 (H) 6 - 20 mg/dL   Creatinine, Ser 2.17 (H) 0.44 - 1.00 mg/dL   Calcium 8.0 (L) 8.9 - 10.3 mg/dL   Phosphorus 3.2 2.5 - 4.6 mg/dL   Albumin 2.3 (L) 3.5 - 5.0 g/dL   GFR calc non Af Amer 26 (L) >60 mL/min   GFR calc Af Amer 30 (L) >60 mL/min    Comment: (NOTE) The eGFR has been calculated using the CKD EPI equation. This calculation has not been validated in all clinical situations. eGFR's persistently <60 mL/min signify possible Chronic Kidney Disease.    Anion gap 7 5 - 15  Magnesium     Status: None   Collection Time: 10/05/16  4:27 AM  Result Value Ref Range   Magnesium 1.7 1.7 - 2.4 mg/dL  Glucose, capillary     Status: None   Collection Time: 10/05/16  7:30 AM  Result Value Ref Range   Glucose-Capillary 81 65 - 99 mg/dL  Glucose, capillary     Status: Abnormal   Collection Time: 10/05/16 11:36 AM  Result Value Ref Range   Glucose-Capillary 102 (H) 65 - 99 mg/dL    Current Facility-Administered Medications  Medication Dose Route Frequency Provider Last Rate Last Dose  . 0.9 %  sodium chloride infusion  250 mL Intravenous PRN Flora Lipps, MD      . 0.9 %  sodium chloride infusion  250 mL Intravenous PRN Flora Lipps, MD 5 mL/hr at 10/05/16 1000 250 mL at 10/05/16 1000  . acetaminophen (TYLENOL) tablet 650 mg  650 mg Oral Q4H PRN Flora Lipps, MD   650 mg at 09/27/16 1805  . albumin human 25 % solution 12.5 g  12.5 g Intravenous Q8H Lavonia Dana, MD   12.5 g at 10/05/16 0527  . chlorhexidine (PERIDEX) 0.12 % solution 15 mL  15 mL Mouth Rinse BID Flora Lipps, MD   15 mL at 10/04/16 2156  . citalopram (CELEXA) tablet 40 mg  40 mg Oral Daily Gonzella Lex, MD   40 mg at 10/05/16 1018  . cloZAPine (CLOZARIL) tablet 200 mg  200 mg Oral Daily Gonzella Lex, MD   200 mg at 10/05/16 1019  . famotidine (PEPCID) IVPB 20 mg premix  20 mg  Intravenous Q24H Sheema M Hallaji, RPH   20 mg at 10/04/16 1143  . haloperidol lactate (HALDOL) injection 5 mg  5 mg Intravenous Once Harrie Foreman, MD      . heparin injection 1,000-6,000 Units  1,000-6,000 Units CRRT PRN Munsoor Lateef, MD   2,800 Units at 10/04/16 0950  . heparin injection 5,000 Units  5,000 Units Subcutaneous Q8H Flora Lipps, MD   5,000 Units at 10/05/16 0527  . hydrocortisone sodium succinate (SOLU-CORTEF) 100 MG injection 25 mg  25 mg Intravenous Daily Laverle Hobby, MD   25 mg at 10/05/16 1035  . LORazepam (ATIVAN) injection 2 mg  2 mg Intravenous Q2H PRN Flora Lipps, MD   2 mg at 10/05/16 1405  . LORazepam (ATIVAN) injection 2 mg  2 mg Intravenous Q6H John  T Clapacs, MD   2 mg at 10/05/16 1156  . MEDLINE mouth rinse  15 mL Mouth Rinse q12n4p Flora Lipps, MD   15 mL at 10/05/16 1200  . OLANZapine zydis (ZYPREXA) disintegrating tablet 10 mg  10 mg Oral BID WC Gonzella Lex, MD   10 mg at 10/05/16 1156  . ondansetron (ZOFRAN) injection 4 mg  4 mg Intravenous Q6H PRN Flora Lipps, MD      . piperacillin-tazobactam (ZOSYN) IVPB 3.375 g  3.375 g Intravenous Q12H Laverle Hobby, MD   3.375 g at 10/05/16 1035  . sodium chloride flush (NS) 0.9 % injection 10-40 mL  10-40 mL Intracatheter Q12H Flora Lipps, MD   10 mL at 10/05/16 1019  . sodium chloride flush (NS) 0.9 % injection 3 mL  3 mL Intravenous Q12H Flora Lipps, MD   3 mL at 10/05/16 1037  . vasopressin (PITRESSIN) 40 Units in sodium chloride 0.9 % 250 mL (0.16 Units/mL) infusion  0.04 Units/min Intravenous Continuous Holley Raring, NP   Stopped at 10/05/16 0802    Musculoskeletal: Strength & Muscle Tone: decreased Gait & Station: unable to stand Patient leans: N/A  Psychiatric Specialty Exam: Physical Exam  Nursing note and vitals reviewed. Constitutional: She appears well-developed and well-nourished. She appears distressed.  HENT:  Head: Normocephalic and atraumatic.  Eyes: Conjunctivae are  normal. Pupils are equal, round, and reactive to light.  Neck: Normal range of motion.  Cardiovascular: Regular rhythm and normal heart sounds.   Respiratory: Effort normal. No respiratory distress.  GI: Soft.  Musculoskeletal: Normal range of motion.  Neurological: Coordination abnormal.  Skin: Skin is warm and dry.  Psychiatric: Her affect is labile and inappropriate. Her speech is delayed. She is agitated and actively hallucinating. Thought content is delusional. She expresses impulsivity. She expresses no homicidal and no suicidal ideation. She is noncommunicative. She exhibits abnormal recent memory and abnormal remote memory. She is inattentive.    Review of Systems  Unable to perform ROS: Medical condition    Blood pressure 108/89, pulse (!) 115, temperature 98 F (36.7 C), temperature source Oral, resp. rate (!) 24, height _0  (1.676 m), weight 128 kg (282 lb 3 oz), SpO2 92 %.Body mass index is 45.55 kg/m.  General Appearance: Disheveled  Eye Contact:  Minimal  Speech:  Garbled  Volume:  Decreased  Mood:  Dysphoric and Irritable  Affect:  Inappropriate  Thought Process:  Disorganized  Orientation:  Negative  Thought Content:  Negative  Suicidal Thoughts:  No  Homicidal Thoughts:  No  Memory:  Negative  Judgement:  Negative  Insight:  Negative  Psychomotor Activity:  Restlessness  Concentration:  Concentration: Poor  Recall:  Poor  Fund of Knowledge:  Fair  Language:  Fair  Akathisia:  No  Handed:  Right  AIMS (if indicated):     Assets:  Catering manager Housing  ADL's:  Impaired  Cognition:  Impaired,  Mild and Moderate  Sleep:        Treatment Plan Summary: Daily contact with patient to assess and evaluate symptoms and progress in treatment, Medication management and Plan Patient was schizophrenia remains confused and probably still delirious as well as having her underlying psychosis. She is at high risk of pulling out crucial [place]. Very  confused. I reviewed treatment options with nursing. We want to get her enough antipsychotic and medication for sedation to control her behavior without over sedating her or causing new health problems. She is back on her clozapine  and mood stabilizer. I propose changing her Ativan from the oral that was currently ordered to 2 mg every 6 hours standing as an intravenous dose. On top of that she has when necessary orders for Ativan can be used. Additionally I am going to add Zyprexa as an additional antipsychotic given as the oral dissolving tablets twice a day 10 mg morning and lunchtime while her clozapine will be at night. This is quite a bit of increase and hopefully will control the delirium and agitation. I will continue to follow-up regularly.  Disposition: Patient does not meet criteria for psychiatric inpatient admission.  Alethia Berthold, MD 10/05/2016 2:09 PM

## 2016-10-05 NOTE — Progress Notes (Signed)
[facility]  ROUNDING NOTE   Subjective:   Off CRRT. UOP 470.   Diarrhea  On precedex   Objective:  Vital signs in last 24 hours:  Temp:  [97.6 F (36.4 C)-98.5 F (36.9 C)] 98.5 F (36.9 C) (12/06 0800) Pulse Rate:  [60-121] 90 (12/06 0830) Resp:  [11-25] 15 (12/06 0830) BP: (72-125)/(41-87) 86/58 (12/06 0830) SpO2:  [89 %-100 %] 98 % (12/06 0830) Weight:  [128 kg (282 lb 3 oz)] 128 kg (282 lb 3 oz) (12/06 0500)  Weight change: 0.4 kg (14.1 oz) Filed Weights   10/03/16 0420 10/04/16 0500 10/05/16 0500  Weight: 134 kg (295 lb 6.7 oz) 127.6 kg (281 lb 4.9 oz) 128 kg (282 lb 3 oz)    Intake/Output: I/O last 3 completed shifts: In: 1323.8 [I.V.:613.8; IV [id number] Out: 1942 [Urine:630; Other:1312]   Intake/Output this shift:  Total I/O In: 16.3 [I.V.:16.3] Out: 25 [Urine:25]  Physical Exam: General: No acute distress  Head: Normocephalic, atraumatic. OM moist  Eyes: Anicteric  Neck: Supple, trachea midline  Lungs:  Scattered rhonchi, normal effort   Heart: S1S2 No rubs   Abdomen:  Umbilical hernia +, BS present   Extremities: 1+peripheral edema.  Neurologic: sedated  Skin: No lesions  Access: L IJ temporary dialysis catheter [id number]    Basic Metabolic Panel:  Recent Labs Lab 10/03/16 2008 10/04/16 0020 10/04/16 0423 10/04/16 0807 10/05/16 0427  NA 142 141 141 142 141  K 3.6 3.8 3.9 4.0 3.2*  CL 108 107 107 110 108  CO2 27 28 29 27 26   GLUCOSE 104* 107* 99 92 94  BUN 21* 19 20 18  27*  CREATININE 1.22* 1.24* 1.11* 1.16* 2.17*  CALCIUM 8.4* 8.4* 8.4* 8.5* 8.0*  MG 1.8 1.8 1.7 1.7 1.7  PHOS 1.8* 1.9* 2.2* 1.9* 3.2    Liver Function Tests:  Recent Labs Lab 10/03/16 2008 10/04/16 0020 10/04/16 0423 10/04/16 0807 10/05/16 0427  ALBUMIN 2.1* 2.3* 2.4* 2.4* 2.3*   No results for input(s): LIPASE, AMYLASE in the last 168 hours. No results for input(s): AMMONIA in the last 168 hours.  CBC:  Recent Labs Lab  09/29/16 0508 09/30/16 0607 10/01/16 0500 10/02/16 0506 10/03/16 0406 10/04/16 0423 10/05/16 0427  WBC 13.0* 8.6 7.6 13.7* 25.1* 24.8* 19.1*  NEUTROABS 11.1* 6.6*  --   --   --   --   --   HGB 11.1* 10.1* 9.7* 7.8* 9.1* 8.8* 7.9*  HCT 32.7* 29.7* 29.1* 23.0* 27.4* 26.5* 23.6*  MCV 91.8 91.5 91.7 91.7 91.8 93.4 91.9  PLT 224 138* 128* 131* 149* 146* 150    Cardiac Enzymes: No results for input(s): CKTOTAL, CKMB, CKMBINDEX, TROPONINI in the last 168 hours.  BNP: Invalid input(s): POCBNP  CBG:  Recent Labs Lab 10/04/16 1130 10/04/16 1641 10/04/16 2004 10/04/16 2342 10/05/16 0730  GLUCAP 97 108* 88 97 81    Microbiology: Results for orders placed or performed during the hospital encounter of 09/27/16  Blood Culture (routine x 2)     Status: None   Collection Time: 09/27/16  6:02 AM  Result Value Ref Range Status   Specimen Description BLOOD  R AC  Final   Special Requests   Final    BOTTLES DRAWN AEROBIC AND ANAEROBIC  AER 3 ML ANA 4 ML   Culture NO GROWTH 5 DAYS  Final   Report Status 10/02/2016 FINAL  Final  Blood Culture (routine x 2)     Status: None   Collection Time:  09/27/16  6:03 AM  Result Value Ref Range Status   Specimen Description BLOOD  R ARM  Final   Special Requests   Final    BOTTLES DRAWN AEROBIC AND ANAEROBIC  AER 6 ML ANA 10 ML   Culture NO GROWTH 5 DAYS  Final   Report Status 10/02/2016 FINAL  Final  Urine culture     Status: None   Collection Time: 09/27/16  9:30 AM  Result Value Ref Range Status   Specimen Description URINE, RANDOM  Final   Special Requests NONE  Final   Culture NO GROWTH Performed at [facility]   Final   Report Status 09/28/2016 FINAL  Final  MRSA PCR Screening     Status: None   Collection Time: 09/27/16 11:09 AM  Result Value Ref Range Status   MRSA by PCR NEGATIVE NEGATIVE Final    Comment:        The GeneXpert MRSA Assay (FDA approved for NASAL specimens only), is one component of a comprehensive  MRSA colonization surveillance program. It is not intended to diagnose MRSA infection nor to guide or monitor treatment for MRSA infections.   C difficile quick scan w PCR reflex     Status: None   Collection Time: 10/04/16 12:20 AM  Result Value Ref Range Status   C Diff antigen NEGATIVE NEGATIVE Final   C Diff toxin NEGATIVE NEGATIVE Final   C Diff interpretation No C. difficile detected.  Final    Coagulation Studies: No results for input(s): LABPROT, INR in the last 72 hours.  Urinalysis: No results for input(s): COLORURINE, LABSPEC, PHURINE, GLUCOSEU, HGBUR, BILIRUBINUR, KETONESUR, PROTEINUR, UROBILINOGEN, NITRITE, LEUKOCYTESUR in the last 72 hours.  Invalid input(s): APPERANCEUR    Imaging: No results found.   Medications:   . dexmedetomidine 0.4 mcg/kg/hr (10/05/16 0748)  . vasopressin (PITRESSIN) infusion - *FOR SHOCK* Stopped (10/05/16 0802)   . albumin human  12.5 g Intravenous Q8H  . chlorhexidine  15 mL Mouth Rinse BID  . citalopram  40 mg Oral Daily  . cloZAPine  200 mg Oral Daily  . famotidine (PEPCID) IV  20 mg Intravenous Q24H  . haloperidol lactate  5 mg Intravenous Once  . heparin  5,000 Units Subcutaneous Q8H  . hydrocortisone sod succinate (SOLU-CORTEF) inj  25 mg Intravenous Daily  . LORazepam  1 mg Oral q morning - 10a  . mouth rinse  15 mL Mouth Rinse q12n4p  . piperacillin-tazobactam (ZOSYN)  IV  3.375 g Intravenous Q12H  . sodium chloride flush  10-40 mL Intracatheter Q12H  . sodium chloride flush  3 mL Intravenous Q12H   sodium chloride, sodium chloride, acetaminophen, heparin, LORazepam, ondansetron (ZOFRAN) IV  Assessment/ Plan:  45 y.o.white female with a PMHx of Crohn's disease, depression, diabetes mellitus type 2, GERD, hiatal hernia, hyperlipidemia, hypertension, hypothyroidism, obesity, schizophrenia, traumatic brain injury, vitamin D deficiency, who was admitted to [facility] on 09/27/2016   1.  Acute renal failure with hyperkalemia  and metabolic acidosis:  No known baseline creatinine in last 3 years.  Acute renal failure sepsis, hypotension, and hypoxemia CRRT from 11/29 to 12/5 - Will monitor for daily dialysis  2.  Acute respiratory failure: extubated - solucortef - Appreciate pulmonary input  3. Sepsis: cultures negative - off vasopressin and other pressors - pip/tazo  4. Nutrition - IV albumin    LOS: [place], [name] 12/6/20179:47 AM

## 2016-10-05 NOTE — Progress Notes (Signed)
PULMONARY / CRITICAL CARE MEDICINE   Name: [name] MRN: [id number] DOB: [birth year]    ADMISSION DATE:  09/27/2016   ASSESSMENT / PLAN:  45 yo obese white female admitted to the ICU for septic shock?UTI with severe metabolic acidosis, acute encephalopathy and acute respiratory failure;  Status post extubation. ARF on CRRT.    PULMONARY A: Acute respiratory failure--improved. Extubated 12/1 Currently on nasal cannula at 2 L. P:   Supplemental oxygen as needed to maintain SPO2 greater than 90 Continue Bronchodilator Decrease Steroids further. Continue optimal pulmonary toilet and monitor respiratory status closely   CARDIOVASCULAR A:  Severe septic shock-blood pressure improved, pressors were weaned down to vasopressin  only, at 0.03 mics. P:  Continuous Telemetry Continue to wean pressors. Keep MAP goal>65 ECHO with normal LVEF of approximately 60%  RENAL A:   AKI possibly related to severe sepsis s/p  CRRT, also started on albumin by nephrology. Hyperkalemia-resolved Hypocalcemia Hypophosphatemia P:   Nephrology following Replace electrolytes per ICU protocol HD per nephro.  Follow up repeat labs  GASTROINTESTINAL A:   No active issues P:   Bedside swallow eval, initiated on dysphagia 1, honey thick. Famotidine for GIP  HEMATOLOGIC A:   Leukocytosis, likely stress related. P:  Heparin for DVT prophylaxis Transfuse if Hgb<7 Follow cbc  INFECTIOUS A:   Severe septic shock P:   Continue broad spectrum antibiotics Follow cultures Monitor fever curve Follow CBC Procalcitonin-54.54>26.3  MICRO 11/28 MRSA negative.  11/28 Urine Cx; negative.  11/28 Blood Cx; negative.  ANTIBIOTICS: Vancomycin 11/28 >> 12/1 Piperacillin/tazo 11/28 >> , now day 9.  ENDOCRINE A:   No active issues, glucose controlled.  P:   Blood Glucose checks with BMP   NEUROLOGIC/Psych A:   Acute encephalopathy, delirium related to severe sepsis. Severe agitation  with underlying schizophrenia. -Psychiatry following. P:   RASS goal: N/A Minimize sedation Precedex infusion for agitation. Continue Zyprexa, Haldol and lorazepam as needed  SUBJECTIVE:  Extubated 09/30/2016. Awakens to voice and touch, and follow some basi commands. Currently on precedex, and the patient's agitation, appears controlled.   VITAL SIGNS: BP 104/75   Pulse 66   Temp 98.2 F (36.8 C) (Oral)   Resp 11   Ht 5\' 6"  (1.676 m)   Wt 128 kg (282 lb 3 oz)   SpO2 100%   BMI 45.55 kg/m   HEMODYNAMICS: CVP:  [0 mmHg-35 mmHg] 11 mmHg      INTAKE / OUTPUT: I/O last 3 completed shifts: In: 1307.5 [I.V.:597.5; IV Piggyback:710] Out: 1902 [Urine:590; Other:1312]  PHYSICAL EXAMINATION: General:  Morbidly obese, sickly appearing white female Neuro:  Opens eyes to voice, follow simple commands, moves extremities HEENT:  Atraumatic, normocephalic, no discharge, no JVD appreciated Cardiovascular: Normal sinus rhythm, S1S2, RRR, No MRG noted Lungs: Normal work of breathing, bilateral airflow with coarse bilaterally and rhonchi bilaterally, no wheezes Abdomen:  Firm, distended, active bowel sounds Musculoskeletal:  No inflammation/deformity noted Skin:  Grossly intact  LABS:  BMET  Recent Labs Lab 10/04/16 0423 10/04/16 0807 10/05/16 0427  NA 141 142 141  K 3.9 4.0 3.2*  CL 107 110 108  CO2 29 27 26   BUN 20 18 27*  CREATININE 1.11* 1.16* 2.17*  GLUCOSE 99 92 94    Electrolytes  Recent Labs Lab 10/04/16 0423 10/04/16 0807 10/05/16 0427  CALCIUM 8.4* 8.5* 8.0*  MG 1.7 1.7 1.7  PHOS 2.2* 1.9* 3.2    CBC  Recent Labs Lab 10/03/16 0406 10/04/16 0423 10/05/16 0427  WBC 25.1* 24.8* 19.1*  HGB 9.1* 8.8* 7.9*  HCT 27.4* 26.5* 23.6*  PLT 149* 146* 150    Coag's  Recent Labs Lab 10/03/16 0406 10/04/16 0423  APTT 38* 40*    Sepsis Markers  Recent Labs Lab 09/29/16 0508  PROCALCITON 26.25    ABG No results for input(s): PHART, PCO2ART,  PO2ART in the last 168 hours.  Liver Enzymes  Recent Labs Lab 10/04/16 0423 10/04/16 0807 10/05/16 0427  ALBUMIN 2.4* 2.4* 2.3*    Cardiac Enzymes No results for input(s): TROPONINI, PROBNP in the last 168 hours.  Glucose  Recent Labs Lab 10/04/16 0732 10/04/16 1130 10/04/16 1641 10/04/16 2004 10/04/16 2342 10/05/16 0730  GLUCAP 89 97 108* 88 97 81    Imaging No results found.  -Marda Stalker, M.D.   Critical Care Attestation.  I have personally obtained a history, examined the patient, evaluated laboratory and imaging results, formulated the assessment and plan and placed orders. The Patient requires high complexity decision making for assessment and support, frequent evaluation and titration of therapies, application of advanced monitoring technologies and extensive interpretation of multiple databases. The patient has critical illness that could lead imminently to failure of 1 or more organ systems and requires the highest level of physician preparedness to intervene.  Critical Care Time devoted to patient care services described in this note is 35 minutes and is exclusive of time spent in procedures.

## 2016-10-06 DIAGNOSIS — S0101XA Laceration without foreign body of scalp, initial encounter: Secondary | ICD-10-CM

## 2016-10-06 LAB — CBC
HCT: 27.2 % — ABNORMAL LOW (ref 35.0–47.0)
HEMOGLOBIN: 8.9 g/dL — AB (ref 12.0–16.0)
MCH: 30.4 pg (ref 26.0–34.0)
MCHC: 32.7 g/dL (ref 32.0–36.0)
MCV: 92.9 fL (ref 80.0–100.0)
PLATELETS: 235 10*3/uL (ref 150–440)
RBC: 2.93 MIL/uL — AB (ref 3.80–5.20)
RDW: 14.8 % — ABNORMAL HIGH (ref 11.5–14.5)
WBC: 24.2 10*3/uL — AB (ref 3.6–11.0)

## 2016-10-06 LAB — GLUCOSE, CAPILLARY
GLUCOSE-CAPILLARY: 114 mg/dL — AB (ref 65–99)
GLUCOSE-CAPILLARY: 101 mg/dL — AB (ref 65–99)
GLUCOSE-CAPILLARY: 130 mg/dL — AB (ref 65–99)
GLUCOSE-CAPILLARY: 134 mg/dL — AB (ref 65–99)
Glucose-Capillary: 108 mg/dL — ABNORMAL HIGH (ref 65–99)
Glucose-Capillary: 96 mg/dL (ref 65–99)

## 2016-10-06 LAB — RENAL FUNCTION PANEL
Albumin: 2.5 g/dL — ABNORMAL LOW (ref 3.5–5.0)
Anion gap: 7 (ref 5–15)
BUN: 30 mg/dL — AB (ref 6–20)
CALCIUM: 8.1 mg/dL — AB (ref 8.9–10.3)
CHLORIDE: 115 mmol/L — AB (ref 101–111)
CO2: 25 mmol/L (ref 22–32)
CREATININE: 3.28 mg/dL — AB (ref 0.44–1.00)
GFR calc non Af Amer: 16 mL/min — ABNORMAL LOW (ref >60)
GFR, EST AFRICAN AMERICAN: 18 mL/min — AB (ref >60)
GLUCOSE: 118 mg/dL — AB (ref 65–99)
Phosphorus: 4.2 mg/dL (ref 2.5–4.6)
Potassium: 3.3 mmol/L — ABNORMAL LOW (ref 3.5–5.1)
SODIUM: 147 mmol/L — AB (ref 135–145)

## 2016-10-06 LAB — MAGNESIUM: MAGNESIUM: 1.8 mg/dL (ref 1.7–2.4)

## 2016-10-06 MED ORDER — BACITRACIN 500 UNIT/GM EX OINT
TOPICAL_OINTMENT | Freq: Two times a day (BID) | CUTANEOUS | Status: DC
  Administered 2016-10-06: 1 via TOPICAL
  Administered 2016-10-07: 23:00:00 via TOPICAL
  Administered 2016-10-07: 1 via TOPICAL
  Administered 2016-10-08 – 2016-10-18 (×18): via TOPICAL
  Filled 2016-10-06: qty 28.35

## 2016-10-06 MED ORDER — LORAZEPAM 1 MG PO TABS
2.0000 mg | ORAL_TABLET | Freq: Four times a day (QID) | ORAL | Status: DC | PRN
  Administered 2016-10-06 – 2016-10-09 (×4): 2 mg via ORAL
  Filled 2016-10-06 (×4): qty 1

## 2016-10-06 MED ORDER — DEXTROSE 5 % IV SOLN
INTRAVENOUS | Status: DC
  Administered 2016-10-06 – 2016-10-08 (×3): via INTRAVENOUS

## 2016-10-06 NOTE — Progress Notes (Addendum)
PULMONARY / CRITICAL CARE MEDICINE   Name: [name] MRN: [id number] DOB: 1970/11/03    ADMISSION DATE:  09/27/2016   Spoke with Dr. Tressia Miners regarding transfer.   ASSESSMENT / PLAN:  45 yo obese white female admitted to the ICU for septic shock?UTI with severe metabolic acidosis, acute encephalopathy and acute respiratory failure;  Status post extubation. ARF s/p CRRT.   -Patient is noted to have laceration on scalp as well as 2 bumps on the head, likely from falling preceded her admission. We'll consult the surgeons for possible sutures. CT head at the time of admission was unremarkable for any acute changes.  PULMONARY A: Acute respiratory failure--improved. Extubated 12/1 Currently on nasal cannula at 2 L. P:   Supplemental oxygen as needed to maintain SPO2 greater than 90 Continue Bronchodilator Discontinue Steroids. Continue optimal pulmonary toilet and monitor respiratory status    CARDIOVASCULAR A:  Severe septic shock-blood pressure improved, pressors were weaned down off P:  Continuous Telemetry ECHO with normal LVEF of approximately 60%  RENAL A:   AKI possibly related to severe sepsis s/p  CRRT Hyperkalemia-resolved Hypocalcemia Hypophosphatemia P:   Nephrology following, will continue as needed dialysis.   GASTROINTESTINAL A:   No active issues P:   Bedside swallow eval, initiated on dysphagia 1, honey thick. Famotidine for GIP  HEMATOLOGIC A:   Leukocytosis, likely stress related. P:  Heparin for DVT prophylaxis Transfuse if Hgb<7 Follow cbc  INFECTIOUS A:   Severe septic shock P:   Continue broad spectrum antibiotics Follow cultures Monitor fever curve Follow CBC Procalcitonin-54.54>26.3  MICRO 11/28 MRSA negative.  11/28 Urine Cx; negative.  11/28 Blood Cx; negative.  ANTIBIOTICS: Vancomycin 11/28 >> 12/1 Piperacillin/tazo 11/28 >> , now day 10 of 10.  ENDOCRINE A:   No active issues, glucose controlled.  P:    Blood Glucose checks with BMP   NEUROLOGIC/Psych A:   Acute encephalopathy, delirium related to severe sepsis and underlying psych issues.  Severe agitation with underlying schizophrenia. -Psychiatry following. P:   RASS goal: N/A Minimize sedation Weaned off precedex.  Continue Zyprexa, Haldol and lorazepam as needed  SUBJECTIVE:  Extubated 09/30/2016. Awaken and alert, and follow some basic commands. Continues to be confused. VITAL SIGNS: BP (!) 103/50   Pulse (!) 115   Temp 98.2 F (36.8 C) (Oral)   Resp 18   Ht 5\' 6"  (1.676 m)   Wt 127.6 kg (281 lb 4.9 oz)   SpO2 98%   BMI 45.40 kg/m   HEMODYNAMICS: CVP:  [3 mmHg-36 mmHg] 34 mmHg      INTAKE / OUTPUT: I/O last 3 completed shifts: In: 629.6 [P.O.:60; I.V.:319.6; IV Piggyback:250] Out: 3235 [Urine:2085; Stool:1150]  PHYSICAL EXAMINATION: General:  Morbidly obese, sickly appearing white female Neuro:  Opens eyes to voice, follow simple commands, moves extremities HEENT:  Atraumatic, normocephalic, no discharge, no JVD appreciated 2 cm laceration on posterior scalp noted by RN earlier, as well as to knot on the head. Cardiovascular: Normal sinus rhythm, S1S2, RRR, No MRG noted Lungs: Normal work of breathing, bilateral airflow with coarse bilaterally and rhonchi bilaterally, no wheezes Abdomen:  Firm, distended, active bowel sounds Musculoskeletal:  No inflammation/deformity noted Skin:  Grossly intact  LABS:  BMET  Recent Labs Lab 10/04/16 0807 10/05/16 0427 10/06/16 0457  NA 142 141 147*  K 4.0 3.2* 3.3*  CL 110 108 115*  CO2 27 26 25   BUN 18 27* 30*  CREATININE 1.16* 2.17* 3.28*  GLUCOSE 92 94 118*  Electrolytes  Recent Labs Lab 10/04/16 0807 10/05/16 0427 10/06/16 0457  CALCIUM 8.5* 8.0* 8.1*  MG 1.7 1.7 1.8  PHOS 1.9* 3.2 4.2    CBC  Recent Labs Lab 10/04/16 0423 10/05/16 0427 10/06/16 0457  WBC 24.8* 19.1* 24.2*  HGB 8.8* 7.9* 8.9*  HCT 26.5* 23.6* 27.2*  PLT 146* 150  235    Coag's  Recent Labs Lab 10/03/16 0406 10/04/16 0423  APTT 38* 40*    Sepsis Markers No results for input(s): LATICACIDVEN, PROCALCITON, O2SATVEN in the last 168 hours.  ABG No results for input(s): PHART, PCO2ART, PO2ART in the last 168 hours.  Liver Enzymes  Recent Labs Lab 10/04/16 0807 10/05/16 0427 10/06/16 0457  ALBUMIN 2.4* 2.3* 2.5*    Cardiac Enzymes No results for input(s): TROPONINI, PROBNP in the last 168 hours.  Glucose  Recent Labs Lab 10/05/16 1136 10/05/16 1707 10/05/16 1936 10/05/16 2327 10/06/16 0355 10/06/16 0714  GLUCAP 102* 99 80 115* 114* 101*    Imaging No results found.  -Marda Stalker, M.D.   Critical Care Attestation.  I have personally obtained a history, examined the patient, evaluated laboratory and imaging results, formulated the assessment and plan and placed orders. The Patient requires high complexity decision making for assessment and support, frequent evaluation and titration of therapies, application of advanced monitoring technologies and extensive interpretation of multiple databases. The patient has critical illness that could lead imminently to failure of 1 or more organ systems and requires the highest level of physician preparedness to intervene.  Critical Care Time devoted to patient care services described in this note is 35 minutes and is exclusive of time spent in procedures.

## 2016-10-06 NOTE — Consult Note (Signed)
[facility] Consult   Reason for Consult:  Follow-up note for this 45 year old woman with schizophrenia currently still in the intensive care unit recovering from sepsis Referring Physician:  Mortimer Fries Patient Identification: [name] MRN:  [id number] Principal Diagnosis: Schizophrenia [facility]) Diagnosis:   Patient Active Problem List   Diagnosis Date Noted  . Pressure injury of skin [L89.90] 10/02/2016  . Schizophrenia ([place]) [F20.9] 09/30/2016  . Acute renal failure (ARF) ([place]) [N17.9]   . Respiratory failure ([place]) [J96.90] 09/27/2016  . Other sign and symptom in breast [N64.59]     Total Time spent with patient: 30 minutes  Subjective:   [name] is a 45 y.o. female patient admitted with "I want to get out of this place".  HPI:  This is a follow-up note for this 45 year old woman with schizophrenia who is recovering from sepsis. Patient seen. Chart reviewed. Spoke with the patient and her aunt. Patient looks much better today. She is actually able to respond verbally. Eyes are open and she is more interactive. Patient still is confused and some of her speech doesn't make much sense. Her onto his present says that the patient is still confused and having some hallucinations although she appears to be much calmer. Tolerating medicine with no sign of being oversedated. Has been able to stay off of the Precedex drip without any dangerous behavior Past Psychiatric History: Long-standing history of schizophrenia on clozapine and other medications.  Risk to Self:   Risk to Others:   Prior Inpatient Therapy:   Prior Outpatient Therapy:    Past Medical History:  Past Medical History:  Diagnosis Date  . Abnormal mammogram, unspecified 2013   Prev. cytology,hypercellular smears without evidence of malignant cells. The cytopathologist questioned if samples truly representative. Care taken during sampling and is felt to be representative.  . Breast screening, unspecified  2013  . Broken leg    age 58  . Cellulitis   . Crohn's disease ([place]) 2013  . Depression   . Diabetes mellitus without complication (HCC)    non insulin dependent  . Early menopause   . Edema   . GERD (gastroesophageal reflux disease)   . Hiatal hernia 2013  . Hyperlipidemia   . Hypertension   . Hypothyroidism   . 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 Obesity, unspecified 2013  . Other sign and symptom in breast 2013   Right bst US,lower outer quadrant,A single 0.3x0.4x0.6cm hypoechoic mass with slightly lobulated borders with adjacent 0.3x0.4x0.5cm mass was noted. The 1st was 5cm from nipple, 2nd at 8 cm from the nipple. The previous lesion aspirate was at 3 0'clock position. These lesions are thought to account for the mammographic abnormality. Minimal interval change on [place].   [facility])   . Rib fracture    age 49  . Schizophrenia ([place])   . TBI (traumatic brain injury) ([place])   . Thyroid disease    hypothyroid  . Vitamin D deficiency     Past Surgical History:  Procedure Laterality Date  . COLONOSCOPY WITH PROPOFOL N/A 02/16/2016   Procedure: COLONOSCOPY WITH PROPOFOL;  Surgeon: Lollie Sails, MD;  [facility] ENDOSCOPY;  Service: Endoscopy;  Laterality: N/A;  . COLONOSCOPY WITH PROPOFOL N/A 02/22/2016   Procedure: COLONOSCOPY WITH PROPOFOL;  Surgeon: Lollie Sails, MD;  Location: [facility] ENDOSCOPY;  Service: Endoscopy;  Laterality: N/A;  . EYE SURGERY Left 2013   cataract surgery  . TUMOR EXCISION  Right    age of 3, tumor removed from RUE   Family History:  Family History  Problem Relation Age of Onset  . Cancer Mother 55    colon  . Cancer Paternal Aunt    Family Psychiatric  History: None identified Social History:  History  Alcohol Use No     History  Drug Use No    Social History   Social History  . Marital status: Single    Spouse name: N/A  . Number of children: N/A  . Years of education: N/A   Social  History Main Topics  . Smoking status: Never Smoker  . Smokeless tobacco: Never Used  . Alcohol use No  . Drug use: No  . Sexual activity: Not Asked   Other Topics Concern  . None   Social History Narrative  . None   Additional Social History:    Allergies:   Allergies  Allergen Reactions  . Peanut Oil Other (See Comments)    Face turns red  . Risperidone And Related Cough    Labs:  Results for orders placed or performed during the hospital encounter of 09/27/16 (from the past 48 hour(s))  Glucose, capillary     Status: Abnormal   Collection Time: 10/04/16  4:41 PM  Result Value Ref Range   Glucose-Capillary 108 (H) 65 - 99 mg/dL  Glucose, capillary     Status: None   Collection Time: 10/04/16  8:04 PM  Result Value Ref Range   Glucose-Capillary 88 65 - 99 mg/dL  Glucose, capillary     Status: None   Collection Time: 10/04/16 11:42 PM  Result Value Ref Range   Glucose-Capillary 97 65 - 99 mg/dL  CBC     Status: Abnormal   Collection Time: 10/05/16  4:27 AM  Result Value Ref Range   WBC 19.1 (H) 3.6 - 11.0 K/uL   RBC 2.57 (L) 3.80 - 5.20 MIL/uL   Hemoglobin 7.9 (L) 12.0 - 16.0 g/dL   HCT 23.6 (L) 35.0 - 47.0 %   MCV 91.9 80.0 - 100.0 fL   MCH 30.8 26.0 - 34.0 pg   MCHC 33.5 32.0 - 36.0 g/dL   RDW 14.4 11.5 - 14.5 %   Platelets 150 150 - 440 K/uL  Renal function panel     Status: Abnormal   Collection Time: 10/05/16  4:27 AM  Result Value Ref Range   Sodium 141 135 - 145 mmol/L   Potassium 3.2 (L) 3.5 - 5.1 mmol/L   Chloride 108 101 - 111 mmol/L   CO2 26 22 - 32 mmol/L   Glucose, Bld 94 65 - 99 mg/dL   BUN 27 (H) 6 - 20 mg/dL   Creatinine, Ser 2.17 (H) 0.44 - 1.00 mg/dL   Calcium 8.0 (L) 8.9 - 10.3 mg/dL   Phosphorus 3.2 2.5 - 4.6 mg/dL   Albumin 2.3 (L) 3.5 - 5.0 g/dL   GFR calc non Af Amer 26 (L) >60 mL/min   GFR calc Af Amer 30 (L) >60 mL/min    Comment: (NOTE) The eGFR has been calculated using the CKD EPI equation. This calculation has not been  validated in all clinical situations. eGFR's persistently <60 mL/min signify possible Chronic Kidney Disease.    Anion gap 7 5 - 15  Magnesium     Status: None   Collection Time: 10/05/16  4:27 AM  Result Value Ref Range   Magnesium 1.7 1.7 - 2.4 mg/dL  Glucose, capillary  Status: None   Collection Time: 10/05/16  7:30 AM  Result Value Ref Range   Glucose-Capillary 81 65 - 99 mg/dL  Glucose, capillary     Status: Abnormal   Collection Time: 10/05/16 11:36 AM  Result Value Ref Range   Glucose-Capillary 102 (H) 65 - 99 mg/dL  Glucose, capillary     Status: None   Collection Time: 10/05/16  5:07 PM  Result Value Ref Range   Glucose-Capillary 99 65 - 99 mg/dL  Glucose, capillary     Status: None   Collection Time: 10/05/16  7:36 PM  Result Value Ref Range   Glucose-Capillary 80 65 - 99 mg/dL  Glucose, capillary     Status: Abnormal   Collection Time: 10/05/16 11:27 PM  Result Value Ref Range   Glucose-Capillary 115 (H) 65 - 99 mg/dL   Comment 1 Notify RN   Glucose, capillary     Status: Abnormal   Collection Time: 10/06/16  3:55 AM  Result Value Ref Range   Glucose-Capillary 114 (H) 65 - 99 mg/dL   Comment 1 Notify RN   Renal function panel     Status: Abnormal   Collection Time: 10/06/16  4:57 AM  Result Value Ref Range   Sodium 147 (H) 135 - 145 mmol/L   Potassium 3.3 (L) 3.5 - 5.1 mmol/L   Chloride 115 (H) 101 - 111 mmol/L   CO2 25 22 - 32 mmol/L   Glucose, Bld 118 (H) 65 - 99 mg/dL   BUN 30 (H) 6 - 20 mg/dL   Creatinine, Ser 3.28 (H) 0.44 - 1.00 mg/dL   Calcium 8.1 (L) 8.9 - 10.3 mg/dL   Phosphorus 4.2 2.5 - 4.6 mg/dL   Albumin 2.5 (L) 3.5 - 5.0 g/dL   GFR calc non Af Amer 16 (L) >60 mL/min   GFR calc Af Amer 18 (L) >60 mL/min    Comment: (NOTE) The eGFR has been calculated using the CKD EPI equation. This calculation has not been validated in all clinical situations. eGFR's persistently <60 mL/min signify possible Chronic Kidney Disease.    Anion gap 7 5 -  15  CBC     Status: Abnormal   Collection Time: 10/06/16  4:57 AM  Result Value Ref Range   WBC 24.2 (H) 3.6 - 11.0 K/uL   RBC 2.93 (L) 3.80 - 5.20 MIL/uL   Hemoglobin 8.9 (L) 12.0 - 16.0 g/dL   HCT 27.2 (L) 35.0 - 47.0 %   MCV 92.9 80.0 - 100.0 fL   MCH 30.4 26.0 - 34.0 pg   MCHC 32.7 32.0 - 36.0 g/dL   RDW 14.8 (H) 11.5 - 14.5 %   Platelets 235 150 - 440 K/uL  Magnesium     Status: None   Collection Time: 10/06/16  4:57 AM  Result Value Ref Range   Magnesium 1.8 1.7 - 2.4 mg/dL  Glucose, capillary     Status: Abnormal   Collection Time: 10/06/16  7:14 AM  Result Value Ref Range   Glucose-Capillary 101 (H) 65 - 99 mg/dL  Glucose, capillary     Status: Abnormal   Collection Time: 10/06/16 11:46 AM  Result Value Ref Range   Glucose-Capillary 130 (H) 65 - 99 mg/dL    Current Facility-Administered Medications  Medication Dose Route Frequency Provider Last Rate Last Dose  . 0.9 %  sodium chloride infusion  250 mL Intravenous PRN Flora Lipps, MD      . 0.9 %  sodium chloride infusion  250  mL Intravenous PRN Flora Lipps, MD 5 mL/hr at 10/06/16 0900 250 mL at 10/06/16 0900  . acetaminophen (TYLENOL) tablet 650 mg  650 mg Oral Q4H PRN Flora Lipps, MD   650 mg at 09/27/16 1805  . chlorhexidine (PERIDEX) 0.12 % solution 15 mL  15 mL Mouth Rinse BID Flora Lipps, MD   15 mL at 10/06/16 0939  . citalopram (CELEXA) tablet 40 mg  40 mg Oral Daily Gonzella Lex, MD   40 mg at 10/06/16 0937  . cloZAPine (CLOZARIL) tablet 200 mg  200 mg Oral Daily Gonzella Lex, MD   200 mg at 10/06/16 0936  . dextrose 5 % solution   Intravenous Continuous Lavonia Dana, MD 50 mL/hr at 10/06/16 1200    . famotidine (PEPCID) IVPB 20 mg premix  20 mg Intravenous Q24H Sheema M Hallaji, RPH   20 mg at 10/06/16 1250  . haloperidol lactate (HALDOL) injection 5 mg  5 mg Intravenous Once Harrie Foreman, MD      . heparin injection 1,000-6,000 Units  1,000-6,000 Units CRRT PRN Munsoor Lateef, MD   2,800 Units at  10/04/16 0950  . heparin injection 5,000 Units  5,000 Units Subcutaneous Q8H Flora Lipps, MD   5,000 Units at 10/06/16 1339  . hydrocortisone sodium succinate (SOLU-CORTEF) 100 MG injection 25 mg  25 mg Intravenous Daily Laverle Hobby, MD   25 mg at 10/06/16 0941  . LORazepam (ATIVAN) injection 2 mg  2 mg Intravenous Q6H Gonzella Lex, MD   2 mg at 10/06/16 1249  . LORazepam (ATIVAN) tablet 2 mg  2 mg Oral Q6H PRN Laverle Hobby, MD      . MEDLINE mouth rinse  15 mL Mouth Rinse q12n4p Flora Lipps, MD   15 mL at 10/06/16 1200  . OLANZapine zydis (ZYPREXA) disintegrating tablet 10 mg  10 mg Oral BID WC Gonzella Lex, MD   10 mg at 10/06/16 1249  . ondansetron (ZOFRAN) injection 4 mg  4 mg Intravenous Q6H PRN Flora Lipps, MD      . piperacillin-tazobactam (ZOSYN) IVPB 3.375 g  3.375 g Intravenous Q12H Laverle Hobby, MD   3.375 g at 10/06/16 1130  . sodium chloride flush (NS) 0.9 % injection 10-40 mL  10-40 mL Intracatheter Q12H Flora Lipps, MD   10 mL at 10/06/16 0944  . sodium chloride flush (NS) 0.9 % injection 3 mL  3 mL Intravenous Q12H Flora Lipps, MD   3 mL at 10/06/16 0944  . vasopressin (PITRESSIN) 40 Units in sodium chloride 0.9 % 250 mL (0.16 Units/mL) infusion  0.04 Units/min Intravenous Continuous Holley Raring, NP   Stopped at 10/05/16 0802    Musculoskeletal: Strength & Muscle Tone: decreased Gait & Station: unable to stand Patient leans: N/A  Psychiatric Specialty Exam: Physical Exam  Nursing note and vitals reviewed. Constitutional: She appears well-developed and well-nourished. No distress.  HENT:  Head: Normocephalic and atraumatic.  Eyes: Conjunctivae are normal. Pupils are equal, round, and reactive to light.  Neck: Normal range of motion.  Cardiovascular: Regular rhythm and normal heart sounds.   Respiratory: Effort normal. No respiratory distress.  GI: Soft.  Musculoskeletal: Normal range of motion.  Neurological: Coordination abnormal.   Skin: Skin is warm and dry.  Psychiatric: Her affect is not labile and not inappropriate. Her speech is delayed. She is actively hallucinating. She is not agitated. Thought content is delusional. She does not express impulsivity. She expresses no homicidal and no suicidal ideation. She  is noncommunicative. She exhibits abnormal recent memory and abnormal remote memory. She is inattentive.    Review of Systems  Unable to perform ROS: Medical condition    Blood pressure 112/61, pulse (!) 108, temperature 98.5 F (36.9 C), temperature source Oral, resp. rate (!) 25, height 5' 6" (1.676 m), weight 127.6 kg (281 lb 4.9 oz), SpO2 98 %.Body mass index is 45.4 kg/m.  General Appearance: Disheveled  Eye Contact:  Minimal  Speech:  Garbled  Volume:  Decreased  Mood:  Dysphoric and Irritable  Affect:  Inappropriate  Thought Process:  Disorganized  Orientation:  Negative  Thought Content:  Negative  Suicidal Thoughts:  No  Homicidal Thoughts:  No  Memory:  Negative  Judgement:  Negative  Insight:  Negative  Psychomotor Activity:  Restlessness  Concentration:  Concentration: Poor  Recall:  Poor  Fund of Knowledge:  Fair  Language:  Fair  Akathisia:  No  Handed:  Right  AIMS (if indicated):     Assets:  Financial Resources/Insurance Housing  ADL's:  Impaired  Cognition:  Impaired,  Mild and Moderate  Sleep:        Treatment Plan Summary: Daily contact with patient to assess and evaluate symptoms and progress in treatment, Medication management and Plan After changes noted yesterday of adding standing doses of Ativan and Zyprexa patient appears to be tolerating those fine and is staying pretty calm. Even though she still has some psychotic symptoms I am not going to make any changes today. I suspect it will take her some time to get more lucid as she continues to recover from her medical problems. Ride to give the patient a little explanation of what was going on and she did appear to pay  attention and have some understanding. Continue current medicine. I will sign out to the psychiatry follow-up over the weekend and then continue to follow-up next week.  Disposition: Patient does not meet criteria for psychiatric inpatient admission.  Alethia Berthold, MD 10/06/2016 1:53 PM

## 2016-10-06 NOTE — Progress Notes (Signed)
[facility]  ROUNDING NOTE   Subjective:   UOP 1825 Na 147 Creatinine 3.28 (2.17) Off vasopressors  Breathing room air  Found to have a laceration on the occiput of her head   Objective:  Vital signs in last 24 hours:  Temp:  [98 F (36.7 C)-98.4 F (36.9 C)] 98.2 F (36.8 C) (12/07 0500) Pulse Rate:  [92-124] 115 (12/07 0400) Resp:  [18-33] 18 (12/07 0300) BP: (86-126)/(43-99) 103/50 (12/07 0600) SpO2:  [92 %-100 %] 98 % (12/07 0400) Weight:  [127.6 kg (281 lb 4.9 oz)] 127.6 kg (281 lb 4.9 oz) (12/07 0500)  Weight change: -0.4 kg (-14.1 oz) Filed Weights   10/04/16 0500 10/05/16 0500 10/06/16 0500  Weight: 127.6 kg (281 lb 4.9 oz) 128 kg (282 lb 3 oz) 127.6 kg (281 lb 4.9 oz)    Intake/Output: I/O last 3 completed shifts: In: 629.6 [P.O.:60; I.V.:319.6; IV Piggyback:250] Out: [id number] [Urine:2085; [id number]   Intake/Output this shift:  No intake/output data recorded.  Physical Exam: General: No acute distress  Head: 2cm laceration occiput. OM moist  Eyes: Anicteric  Neck: Supple, trachea midline  Lungs:  clear, normal effort   Heart: S1S2 No rubs   Abdomen:  Umbilical hernia +, BS present   Extremities: 1+peripheral edema.  Neurologic: sedated  Skin: No lesions  Access: L IJ temporary dialysis catheter [id number]    Basic Metabolic Panel:  Recent Labs Lab 10/04/16 0020 10/04/16 0423 10/04/16 0807 10/05/16 0427 10/06/16 0457  NA 141 141 142 141 147*  K 3.8 3.9 4.0 3.2* 3.3*  CL 107 107 110 108 115*  CO2 28 29 27 26 25   GLUCOSE 107* 99 92 94 118*  BUN 19 20 18  27* 30*  CREATININE 1.24* 1.11* 1.16* 2.17* 3.28*  CALCIUM 8.4* 8.4* 8.5* 8.0* 8.1*  MG 1.8 1.7 1.7 1.7 1.8  PHOS 1.9* 2.2* 1.9* 3.2 4.2    Liver Function Tests:  Recent Labs Lab 10/04/16 0020 10/04/16 0423 10/04/16 0807 10/05/16 0427 10/06/16 0457  ALBUMIN 2.3* 2.4* 2.4* 2.3* 2.5*   No results for input(s): LIPASE, AMYLASE in the last 168 hours. No results for  input(s): AMMONIA in the last 168 hours.  CBC:  Recent Labs Lab 09/30/16 0607  10/02/16 0506 10/03/16 0406 10/04/16 0423 10/05/16 0427 10/06/16 0457  WBC 8.6  < > 13.7* 25.1* 24.8* 19.1* 24.2*  NEUTROABS 6.6*  --   --   --   --   --   --   HGB 10.1*  < > 7.8* 9.1* 8.8* 7.9* 8.9*  HCT 29.7*  < > 23.0* 27.4* 26.5* 23.6* 27.2*  MCV 91.5  < > 91.7 91.8 93.4 91.9 92.9  PLT 138*  < > 131* 149* 146* 150 235  < > = values in this interval not displayed.  Cardiac Enzymes: No results for input(s): CKTOTAL, CKMB, CKMBINDEX, TROPONINI in the last 168 hours.  BNP: Invalid input(s): POCBNP  CBG:  Recent Labs Lab 10/05/16 1707 10/05/16 1936 10/05/16 2327 10/06/16 0355 10/06/16 0714  GLUCAP 99 80 115* 114* 101*    Microbiology: Results for orders placed or performed during the hospital encounter of 09/27/16  Blood Culture (routine x 2)     Status: None   Collection Time: 09/27/16  6:02 AM  Result Value Ref Range Status   Specimen Description BLOOD  R AC  Final   Special Requests   Final    BOTTLES DRAWN AEROBIC AND ANAEROBIC  AER 3 ML ANA 4 ML  Culture NO GROWTH 5 DAYS  Final   Report Status 10/02/2016 FINAL  Final  Blood Culture (routine x 2)     Status: None   Collection Time: 09/27/16  6:03 AM  Result Value Ref Range Status   Specimen Description BLOOD  R ARM  Final   Special Requests   Final    BOTTLES DRAWN AEROBIC AND ANAEROBIC  AER 6 ML ANA 10 ML   Culture NO GROWTH 5 DAYS  Final   Report Status 10/02/2016 FINAL  Final  Urine culture     Status: None   Collection Time: 09/27/16  9:30 AM  Result Value Ref Range Status   Specimen Description URINE, RANDOM  Final   Special Requests NONE  Final   Culture NO GROWTH Performed at [facility]   Final   Report Status 09/28/2016 FINAL  Final  MRSA PCR Screening     Status: None   Collection Time: 09/27/16 11:09 AM  Result Value Ref Range Status   MRSA by PCR NEGATIVE NEGATIVE Final    Comment:        The  GeneXpert MRSA Assay (FDA approved for NASAL specimens only), is one component of a comprehensive MRSA colonization surveillance program. It is not intended to diagnose MRSA infection nor to guide or monitor treatment for MRSA infections.   C difficile quick scan w PCR reflex     Status: None   Collection Time: 10/04/16 12:20 AM  Result Value Ref Range Status   C Diff antigen NEGATIVE NEGATIVE Final   C Diff toxin NEGATIVE NEGATIVE Final   C Diff interpretation No C. difficile detected.  Final    Coagulation Studies: No results for input(s): LABPROT, INR in the last 72 hours.  Urinalysis: No results for input(s): COLORURINE, LABSPEC, PHURINE, GLUCOSEU, HGBUR, BILIRUBINUR, KETONESUR, PROTEINUR, UROBILINOGEN, NITRITE, LEUKOCYTESUR in the last 72 hours.  Invalid input(s): APPERANCEUR    Imaging: No results found.   Medications:   . dextrose    . vasopressin (PITRESSIN) infusion - *FOR SHOCK* Stopped (10/05/16 0802)   . chlorhexidine  15 mL Mouth Rinse BID  . citalopram  40 mg Oral Daily  . cloZAPine  200 mg Oral Daily  . famotidine (PEPCID) IV  20 mg Intravenous Q24H  . haloperidol lactate  5 mg Intravenous Once  . heparin  5,000 Units Subcutaneous Q8H  . hydrocortisone sod succinate (SOLU-CORTEF) inj  25 mg Intravenous Daily  . LORazepam  2 mg Intravenous Q6H  . mouth rinse  15 mL Mouth Rinse q12n4p  . OLANZapine zydis  10 mg Oral BID WC  . piperacillin-tazobactam (ZOSYN)  IV  3.375 g Intravenous Q12H  . sodium chloride flush  10-40 mL Intracatheter Q12H  . sodium chloride flush  3 mL Intravenous Q12H   sodium chloride, sodium chloride, acetaminophen, heparin, LORazepam, ondansetron (ZOFRAN) IV  Assessment/ Plan:  45 y.o.white female with a PMHx of Crohn's disease, depression, diabetes mellitus type 2, GERD, hiatal hernia, hyperlipidemia, hypertension, hypothyroidism, obesity, schizophrenia, traumatic brain injury, vitamin D deficiency, who was admitted to [facility] on  09/27/2016   1.  Acute renal failure with hyperkalemia and metabolic acidosis:  Nonoliguric urine output. No acute indication for dialysis No known baseline creatinine in last 3 years.  Acute renal failure sepsis, hypotension, and hypoxemia CRRT from 11/29 to 12/5 - Will monitor for daily dialysis  2.  Hypernatremia: concern for free water deficit - start D5 in sterile water.   3. Sepsis: cultures negative - off  vasopressin and other pressors - solucortef - pip/tazo  4. Nutrition - IV albumin    LOS: [place], [place] 12/7/20179:21 AM

## 2016-10-06 NOTE — Care Management (Signed)
Received patient from icu.  Complicated hospital course for shock requiring pressors, sedation, intubation and crrt.  Patient will be monitored daily for the need of dialysis.  CM will speak with nephrology regarding need to plan for dialysis post acute hospitalization.  She is from a group home.  CM will speak to attending regarding PT and OT consults.

## 2016-10-06 NOTE — Plan of Care (Signed)
Problem: Education: Goal: Knowledge of [facility] Education information/materials will improve Outcome: Not Progressing Patient does not show signs of understanding.   Problem: Safety: Goal: Ability to remain free from injury will improve Outcome: Not Progressing Patient does not show signs of understanding.   Problem: Health Behavior/Discharge Planning: Goal: Ability to manage health-related needs will improve Outcome: Progressing Patient does not show signs of understanding.   Problem: Physical Regulation: Goal: Ability to maintain clinical measurements within normal limits will improve Outcome: Not Progressing Patient does not show signs of understanding.   Problem: Skin Integrity: Goal: Risk for impaired skin integrity will decrease Outcome: Not Progressing Patient does not show signs of understanding.   Problem: Spiritual Needs Goal: Ability to function at adequate level Outcome: Not Progressing Patient does not show signs of understanding.   Comments: Patient is in the bed, confused, and fidgeting. Patient is pleasant and sweet- just needs to be re-oriented to why she is here and where she is at.

## 2016-10-06 NOTE — Consult Note (Signed)
Patient ID: [name], female   DOB: 24-Apr-1971, 45 y.o.   MRN: [id number]  HPI [name] is a 45 y.o. female asked to see pt in consultation for laceration of the scalp She was admitted approximately 9 days ago for septic shock, respiratory failure and acute renal failure. She was intubated on the vent, on prerssors and on hemodialysis. Since then she has made significant progress and today was transferred from the ICU in stable condition. She has a history of schizophrenia and currently there is still some delirium and the patient currently is an not competent and is only able to answer very simple questions. The majority of the history is taken from her and that is at the bedside. Apparently daily day found a laceration on the occiput while they were babying her. I am apparently she did fall 9 days ago but the circumstances are unknown. As been no reported evidence of infection of the wound or any bleeding.  HPI  Past Medical History:  Diagnosis Date  . Abnormal mammogram, unspecified 2013   Prev. cytology,hypercellular smears without evidence of malignant cells. The cytopathologist questioned if samples truly representative. Care taken during sampling and is felt to be representative.  . Breast screening, unspecified 2013  . Broken leg    age 15  . Cellulitis   . Crohn's disease ([place]) 2013  . Depression   . Diabetes mellitus without complication (HCC)    non insulin dependent  . Early menopause   . Edema   . GERD (gastroesophageal reflux disease)   . Hiatal hernia 2013  . Hyperlipidemia   . Hypertension   . Hypothyroidism   . 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 Obesity, unspecified 2013  . Other sign and symptom in breast 2013   Right bst US,lower outer quadrant,A single 0.3x0.4x0.6cm hypoechoic mass with slightly lobulated borders with adjacent 0.3x0.4x0.5cm mass was noted. The 1st was 5cm from nipple, 2nd at 8 cm from the nipple. The previous  lesion aspirate was at 3 0'clock position. These lesions are thought to account for the mammographic abnormality. Minimal interval change on [place].   [facility])   . Rib fracture    age 56  . Schizophrenia ([place])   . TBI (traumatic brain injury) ([place])   . Thyroid disease    hypothyroid  . Vitamin D deficiency     Past Surgical History:  Procedure Laterality Date  . COLONOSCOPY WITH PROPOFOL N/A 02/16/2016   Procedure: COLONOSCOPY WITH PROPOFOL;  Surgeon: Lollie Sails, MD;  Location: [facility] ENDOSCOPY;  Service: Endoscopy;  Laterality: N/A;  . COLONOSCOPY WITH PROPOFOL N/A 02/22/2016   Procedure: COLONOSCOPY WITH PROPOFOL;  Surgeon: Lollie Sails, MD;  Location: [facility] ENDOSCOPY;  Service: Endoscopy;  Laterality: N/A;  . EYE SURGERY Left 2013   cataract surgery  . TUMOR EXCISION Right    age of 54, tumor removed from RUE    Family History  Problem Relation Age of Onset  . Cancer Mother 41    colon  . Cancer Paternal Aunt     Social History Social History  Substance Use Topics  . Smoking status: Never Smoker  . Smokeless tobacco: Never Used  . Alcohol use No    Allergies  Allergen Reactions  . Peanut Oil Other (See Comments)    Face turns red  . Risperidone And Related Cough    Current Facility-Administered Medications  Medication Dose Route Frequency Provider Last  Rate Last Dose  . 0.9 %  sodium chloride infusion  250 mL Intravenous PRN Flora Lipps, MD      . 0.9 %  sodium chloride infusion  250 mL Intravenous PRN Flora Lipps, MD 5 mL/hr at 10/06/16 0900 250 mL at 10/06/16 0900  . acetaminophen (TYLENOL) tablet 650 mg  650 mg Oral Q4H PRN Flora Lipps, MD   650 mg at 09/27/16 1805  . chlorhexidine (PERIDEX) 0.12 % solution 15 mL  15 mL Mouth Rinse BID Flora Lipps, MD   15 mL at 10/06/16 0939  . citalopram (CELEXA) tablet 40 mg  40 mg Oral Daily Gonzella Lex, MD   40 mg at 10/06/16 0937  . cloZAPine (CLOZARIL) tablet 200 mg  200 mg Oral Daily Gonzella Lex, MD   200 mg at 10/06/16 0936  . dextrose 5 % solution   Intravenous Continuous Lavonia Dana, MD 50 mL/hr at 10/06/16 1300    . famotidine (PEPCID) IVPB 20 mg premix  20 mg Intravenous Q24H Sheema M Hallaji, RPH   20 mg at 10/06/16 1250  . haloperidol lactate (HALDOL) injection 5 mg  5 mg Intravenous Once Harrie Foreman, MD      . heparin injection 5,000 Units  5,000 Units Subcutaneous Q8H Flora Lipps, MD   5,000 Units at 10/06/16 1339  . hydrocortisone sodium succinate (SOLU-CORTEF) 100 MG injection 25 mg  25 mg Intravenous Daily Laverle Hobby, MD   25 mg at 10/06/16 0941  . LORazepam (ATIVAN) injection 2 mg  2 mg Intravenous Q6H Gonzella Lex, MD   2 mg at 10/06/16 1249  . LORazepam (ATIVAN) tablet 2 mg  2 mg Oral Q6H PRN Laverle Hobby, MD      . MEDLINE mouth rinse  15 mL Mouth Rinse q12n4p Flora Lipps, MD   15 mL at 10/06/16 1200  . OLANZapine zydis (ZYPREXA) disintegrating tablet 10 mg  10 mg Oral BID WC Gonzella Lex, MD   10 mg at 10/06/16 1249  . ondansetron (ZOFRAN) injection 4 mg  4 mg Intravenous Q6H PRN Flora Lipps, MD      . piperacillin-tazobactam (ZOSYN) IVPB 3.375 g  3.375 g Intravenous Q12H Laverle Hobby, MD   3.375 g at 10/06/16 1130  . sodium chloride flush (NS) 0.9 % injection 10-40 mL  10-40 mL Intracatheter Q12H Flora Lipps, MD   10 mL at 10/06/16 0944  . sodium chloride flush (NS) 0.9 % injection 3 mL  3 mL Intravenous Q12H Flora Lipps, MD   3 mL at 10/06/16 0944     Review of Systems ROS impossible to obtain given her psychiatric condition.  Physical Exam Blood pressure (!) 126/56, pulse (!) 109, temperature 99 F (37.2 C), temperature source Oral, resp. rate 19, height 5\' 6"  (1.676 m), weight 127.6 kg (281 lb 4.9 oz), SpO2 95 %. CONSTITUTIONAL: NAD, awake but disoriented to time and place EYES: Pupils are equal, round, and reactive to light, Sclera are non-icteric. EARS, NOSE, MOUTH AND THROAT: The oropharynx is clear. The oral  mucosa is pink and moist. Hearing is intact to voice. LYMPH NODES:  Lymph nodes in the neck are normal. RESPIRATORY:  Lungs are clear. There is normal respiratory effort, with equal breath sounds bilaterally, and without pathologic use of accessory muscles. CARDIOVASCULAR: Heart is regular without murmurs, gallops, or rubs. GI: The abdomen is soft, nontender, and nondistended. There are no palpable masses.GU: Rectal deferred.   MUSCULOSKELETAL:. No cyanosis or deformities   SCALP:  There is a 2 cm laceration on the occipital parietal area. The base is clean there is no evidence of infection or bleeding. NEUROLOGIC: . Cranial nerves are grossly intact.no focal deficits PSYCH:  DisOriented to person, place and time.    Data Reviewed  I have personally reviewed the patient's imaging, laboratory findings and medical records.    Assessment/Plan SMALL laceration of the scalp. I do not recommend any repair given its chronicity and multiple comorbidities that patient has suffered including prolonged pressor use And steroids. Recommend daily antibiotic ointment and cleaning and trimming some of the hair. Discussed plan with nursing staff in detail. No need for any surgical intervention at this time.  Caroleen Hamman, MD FACS General Surgeon 10/06/2016, 3:13 PM

## 2016-10-06 NOTE — Progress Notes (Signed)
Pharmacy Antibiotic   [name] is a 45 y.o. female admitted on 09/27/2016 with sepsis from unknown source with severe metabolic acidosis and acute encephalopathy. Pharmacy has been consulted for piperacillin/tazobactam dosing and to dose adjust medications based on CRRT. CRRT began 11/29, Was stopped 12/2, and then restarted 12/3 and has now been discontinued again as of 12/5.   Plan: Antibiotics: Will continue Zosyn q12 hours.    Will continue famotidine every 24 hours   Height: 5\' 6"  (167.6 cm) Weight: 281 lb 4.9 oz (127.6 kg) IBW/kg (Calculated) : 59.3  Temp (24hrs), Avg:98.2 F (36.8 C), Min:98 F (36.7 C), Max:98.4 F (36.9 C)   Recent Labs Lab 10/02/16 0506  10/03/16 0406  10/04/16 0020 10/04/16 0423 10/04/16 0807 10/05/16 0427 10/06/16 0457  WBC 13.7*  --  25.1*  --   --  24.8*  --  19.1* 24.2*  CREATININE 1.88*  < > 1.58*  < > 1.24* 1.11* 1.16* 2.17* 3.28*  < > = values in this interval not displayed.  Estimated Creatinine Clearance: 29.6 mL/min (by C-G formula based on SCr of 3.28 mg/dL (H)).    Allergies  Allergen Reactions  . Peanut Oil Other (See Comments)    Face turns red  . Risperidone And Related Cough    Antimicrobials this admission: Vancomycin 11/28 >> 12/1 Piperacillin/tazo 11/28 >>   Dose adjustments this admission:   Microbiology results: 11/28 BCx: NG x 5 days 11/28 UCx: No growth but collected after antibiotic administration 11/28 MRSA PCR: negative  Thank you for allowing pharmacy to be a part of this patient's care.  Larene Beach, PharmD  Clinical Pharmacist 10/06/2016 8:14 AM

## 2016-10-06 NOTE — Progress Notes (Signed)
Patient transferred from CCU.  VSS, family at bedside.  Report given to Bridget Hartshorn, RN.

## 2016-10-06 NOTE — Progress Notes (Addendum)
Patient signed out to hospitalist service. 45 year old female with schizophrenia admitted to ICU for septic shock requiring pressors and also acute renal failure requiring vasopressors and acute dialysis. Patient was intubated, on CRRT in the ICU. -Currently requiring when necessary dialysis. Nephrology is following. She has been extubated. Still has mental confusion and agitation episodes and was on Precedex drip which has been weaned off. Patient will be transferred to medical floor. Psychiatry has been following. -Hospitalists will assume care from tomorrow

## 2016-10-07 LAB — CBC WITH DIFFERENTIAL/PLATELET
BASOS ABS: 0 10*3/uL (ref 0–0.1)
Basophils Relative: 0 %
EOS PCT: 1 %
Eosinophils Absolute: 0.2 10*3/uL (ref 0–0.7)
HEMATOCRIT: 23.6 % — AB (ref 35.0–47.0)
HEMOGLOBIN: 7.8 g/dL — AB (ref 12.0–16.0)
LYMPHS PCT: 26 %
Lymphs Abs: 4.5 10*3/uL — ABNORMAL HIGH (ref 1.0–3.6)
MCH: 30.8 pg (ref 26.0–34.0)
MCHC: 33 g/dL (ref 32.0–36.0)
MCV: 93.4 fL (ref 80.0–100.0)
MONOS PCT: 9 %
Monocytes Absolute: 1.5 10*3/uL — ABNORMAL HIGH (ref 0.2–0.9)
NEUTROS PCT: 64 %
Neutro Abs: 11 10*3/uL — ABNORMAL HIGH (ref 1.4–6.5)
Platelets: 239 10*3/uL (ref 150–440)
RBC: 2.52 MIL/uL — AB (ref 3.80–5.20)
RDW: 14.5 % (ref 11.5–14.5)
WBC: 17.2 10*3/uL — AB (ref 3.6–11.0)

## 2016-10-07 LAB — RENAL FUNCTION PANEL
ANION GAP: 9 (ref 5–15)
Albumin: 2.3 g/dL — ABNORMAL LOW (ref 3.5–5.0)
BUN: 37 mg/dL — AB (ref 6–20)
CALCIUM: 8 mg/dL — AB (ref 8.9–10.3)
CO2: 24 mmol/L (ref 22–32)
Chloride: 116 mmol/L — ABNORMAL HIGH (ref 101–111)
Creatinine, Ser: 4.19 mg/dL — ABNORMAL HIGH (ref 0.44–1.00)
GFR, EST AFRICAN AMERICAN: 14 mL/min — AB (ref >60)
GFR, EST NON AFRICAN AMERICAN: 12 mL/min — AB (ref >60)
GLUCOSE: 107 mg/dL — AB (ref 65–99)
PHOSPHORUS: 4.3 mg/dL (ref 2.5–4.6)
Potassium: 3.1 mmol/L — ABNORMAL LOW (ref 3.5–5.1)
Sodium: 149 mmol/L — ABNORMAL HIGH (ref 135–145)

## 2016-10-07 LAB — GLUCOSE, CAPILLARY
GLUCOSE-CAPILLARY: 92 mg/dL (ref 65–99)
GLUCOSE-CAPILLARY: 93 mg/dL (ref 65–99)
GLUCOSE-CAPILLARY: 112 mg/dL — AB (ref 65–99)
GLUCOSE-CAPILLARY: 85 mg/dL (ref 65–99)

## 2016-10-07 MED ORDER — ENSURE ENLIVE PO LIQD
237.0000 mL | Freq: Two times a day (BID) | ORAL | Status: DC
  Administered 2016-10-07 – 2016-10-11 (×4): 237 mL via ORAL

## 2016-10-07 MED ORDER — FAMOTIDINE 20 MG PO TABS
20.0000 mg | ORAL_TABLET | Freq: Every day | ORAL | Status: DC
  Administered 2016-10-07 – 2016-10-18 (×12): 20 mg via ORAL
  Filled 2016-10-07 (×11): qty 1

## 2016-10-07 MED ORDER — TUBERCULIN PPD 5 UNIT/0.1ML ID SOLN
5.0000 [IU] | Freq: Once | INTRADERMAL | Status: AC
Start: 2016-10-07 — End: 2016-10-09
  Administered 2016-10-07: 5 [IU] via INTRADERMAL
  Filled 2016-10-07: qty 0.1

## 2016-10-07 MED ORDER — EPOETIN ALFA 10000 UNIT/ML IJ SOLN
10000.0000 [IU] | Freq: Once | INTRAMUSCULAR | Status: AC
  Administered 2016-10-15: 10000 [IU] via INTRAVENOUS

## 2016-10-07 NOTE — Progress Notes (Signed)
HD initiated without issue. No UF per orders.

## 2016-10-07 NOTE — Progress Notes (Signed)
PPD Done today at 10 am on 10/07/2016 on Left arm

## 2016-10-07 NOTE — Progress Notes (Signed)
MEDICATION RELATED CONSULT NOTE - INITIAL   Pharmacy Consult for Clozapine monitoring  Indication: clozapine   Allergies  Allergen Reactions  . Peanut Oil Other (See Comments)    Face turns red  . Risperidone And Related Cough   Patient Measurements: Height: 5\' 6"  (167.6 cm) Weight: 273 lb 4.8 oz (124 kg) IBW/kg (Calculated) : 59.3  Vital Signs: Temp: 98.3 F (36.8 C) (12/08 0335) Temp Source: Oral (12/08 0335) BP: 122/56 (12/08 0335) Pulse Rate: 115 (12/08 0335) Intake/Output from previous day: 12/07 0701 - 12/08 0700 In: 986.3 [I.V.:186.3; IV Piggyback:100] Out: 2050 [Urine:1550; Stool:500] Intake/Output from this shift: No intake/output data recorded.  Labs:  Recent Labs  10/05/16 0427 10/06/16 0457 10/07/16 0434  WBC 19.1* 24.2* 17.2*  HGB 7.9* 8.9* 7.8*  HCT 23.6* 27.2* 23.6*  PLT 150 235 239  CREATININE 2.17* 3.28* 4.19*  MG 1.7 1.8  --   PHOS 3.2 4.2 4.3  ALBUMIN 2.3* 2.5* 2.3*   Estimated Creatinine Clearance: 22.8 mL/min (by C-G formula based on SCr of 4.19 mg/dL (H)).  Assessment: Pharmacy consulted to monitor [place] in this patient receiving clozapine. Psychiatry is consulted and following inpatient.  Plan:  [place] on 12/8 = 11. Labs entered into clozapine registry and patient is eligible to continue receiving clozapine.   Will recheck [address] on 10/14/16.  Lenis Noon, PharmD, BCPS Clinical Pharmacist 10/07/2016,10:58 AM

## 2016-10-07 NOTE — Progress Notes (Signed)
Pre dialysis  

## 2016-10-07 NOTE — Progress Notes (Signed)
   10/07/16 1538 10/07/16 1600  During Hemodialysis Assessment  Arterial Pressure (mmHg) --  -140 mmHg  Venous Pressure (mmHg) --  140 mmHg  Transmembrane Pressure (mmHg) --  20 mmHg  Ultrafiltration Rate (mL/min) --  170 mL/min  Dialysate Flow Rate (mL/min) --  600 ml/min  Conductivity: Machine  --  13.4  HD Safety Checks Performed --  Yes  Dialysis Fluid Bolus Normal Saline --   Bolus Amount (mL) 100 mL --   Intra-Hemodialysis Comments system flushed for patency 220. no change

## 2016-10-07 NOTE — Progress Notes (Signed)
[facility]  ROUNDING NOTE   Subjective:   Aunt and Uncle at bedside. Patient continues to be confused.   Creatinine 4.19 (3.28)  D5 at 64mL/hr  Objective:  Vital signs in last 24 hours:  Temp:  [98.3 F (36.8 C)-99 F (37.2 C)] 98.3 F (36.8 C) (12/08 0335) Pulse Rate:  [61-115] 115 (12/08 0335) Resp:  [18-32] 18 (12/08 0335) BP: (99-126)/(56-83) 122/56 (12/08 0335) SpO2:  [95 %-99 %] 98 % (12/08 0335) Weight:  [124 kg (273 lb 4.8 oz)] 124 kg (273 lb 4.8 oz) (12/08 0335)  Weight change: -3.632 kg (-8 lb 0.1 oz) Filed Weights   10/05/16 0500 10/06/16 0500 10/07/16 0335  Weight: 128 kg (282 lb 3 oz) 127.6 kg (281 lb 4.9 oz) 124 kg (273 lb 4.8 oz)    Intake/Output: I/O last 3 completed shifts: In: [id number] [I.V.:243; Other:700; IV Piggyback:100] Out: 3650 [Urine:2500; [id number]   Intake/Output this shift:  No intake/output data recorded.  Physical Exam: General: No acute distress  Head: 2cm laceration occiput. OM moist  Eyes: Anicteric  Neck: Supple, trachea midline  Lungs:  clear, normal effort   Heart: S1S2 No rubs   Abdomen:  Umbilical hernia +, BS present   Extremities: 1+peripheral edema.  Neurologic: sedated  Skin: No lesions  Access: L IJ temporary dialysis catheter [id number]    Basic Metabolic Panel:  Recent Labs Lab 10/04/16 0020 10/04/16 0423 10/04/16 0807 10/05/16 0427 10/06/16 0457 10/07/16 0434  NA 141 141 142 141 147* 149*  K 3.8 3.9 4.0 3.2* 3.3* 3.1*  CL 107 107 110 108 115* 116*  CO2 28 29 27 26 25 24   GLUCOSE 107* 99 92 94 118* 107*  BUN 19 20 18  27* 30* 37*  CREATININE 1.24* 1.11* 1.16* 2.17* 3.28* 4.19*  CALCIUM 8.4* 8.4* 8.5* 8.0* 8.1* 8.0*  MG 1.8 1.7 1.7 1.7 1.8  --   PHOS 1.9* 2.2* 1.9* 3.2 4.2 4.3    Liver Function Tests:  Recent Labs Lab 10/04/16 0423 10/04/16 0807 10/05/16 0427 10/06/16 0457 10/07/16 0434  ALBUMIN 2.4* 2.4* 2.3* 2.5* 2.3*   No results for input(s): LIPASE, AMYLASE in the last 168  hours. No results for input(s): AMMONIA in the last 168 hours.  CBC:  Recent Labs Lab 10/03/16 0406 10/04/16 0423 10/05/16 0427 10/06/16 0457 10/07/16 0434  WBC 25.1* 24.8* 19.1* 24.2* 17.2*  NEUTROABS  --   --   --   --  11.0*  HGB 9.1* 8.8* 7.9* 8.9* 7.8*  HCT 27.4* 26.5* 23.6* 27.2* 23.6*  MCV 91.8 93.4 91.9 92.9 93.4  PLT 149* 146* 150 235 239    Cardiac Enzymes: No results for input(s): CKTOTAL, CKMB, CKMBINDEX, TROPONINI in the last 168 hours.  BNP: Invalid input(s): POCBNP  CBG:  Recent Labs Lab 10/06/16 1625 10/06/16 2106 10/06/16 2331 10/07/16 0508 10/07/16 0735  GLUCAP 134* 108* 96 92 93    Microbiology: Results for orders placed or performed during the hospital encounter of 09/27/16  Blood Culture (routine x 2)     Status: None   Collection Time: 09/27/16  6:02 AM  Result Value Ref Range Status   Specimen Description BLOOD  R AC  Final   Special Requests   Final    BOTTLES DRAWN AEROBIC AND ANAEROBIC  AER 3 ML ANA 4 ML   Culture NO GROWTH 5 DAYS  Final   Report Status 10/02/2016 FINAL  Final  Blood Culture (routine x 2)     Status: None  Collection Time: 09/27/16  6:03 AM  Result Value Ref Range Status   Specimen Description BLOOD  R ARM  Final   Special Requests   Final    BOTTLES DRAWN AEROBIC AND ANAEROBIC  AER 6 ML ANA 10 ML   Culture NO GROWTH 5 DAYS  Final   Report Status 10/02/2016 FINAL  Final  Urine culture     Status: None   Collection Time: 09/27/16  9:30 AM  Result Value Ref Range Status   Specimen Description URINE, RANDOM  Final   Special Requests NONE  Final   Culture NO GROWTH Performed at [facility]   Final   Report Status 09/28/2016 FINAL  Final  MRSA PCR Screening     Status: None   Collection Time: 09/27/16 11:09 AM  Result Value Ref Range Status   MRSA by PCR NEGATIVE NEGATIVE Final    Comment:        The GeneXpert MRSA Assay (FDA approved for NASAL specimens only), is one component of a comprehensive  MRSA colonization surveillance program. It is not intended to diagnose MRSA infection nor to guide or monitor treatment for MRSA infections.   C difficile quick scan w PCR reflex     Status: None   Collection Time: 10/04/16 12:20 AM  Result Value Ref Range Status   C Diff antigen NEGATIVE NEGATIVE Final   C Diff toxin NEGATIVE NEGATIVE Final   C Diff interpretation No C. difficile detected.  Final    Coagulation Studies: No results for input(s): LABPROT, INR in the last 72 hours.  Urinalysis: No results for input(s): COLORURINE, LABSPEC, PHURINE, GLUCOSEU, HGBUR, BILIRUBINUR, KETONESUR, PROTEINUR, UROBILINOGEN, NITRITE, LEUKOCYTESUR in the last 72 hours.  Invalid input(s): APPERANCEUR    Imaging: No results found.   Medications:   . dextrose 50 mL/hr at 10/07/16 0903   . bacitracin   Topical BID  . chlorhexidine  15 mL Mouth Rinse BID  . citalopram  40 mg Oral Daily  . cloZAPine  200 mg Oral Daily  . epoetin (EPOGEN/PROCRIT) injection  10,000 Units Intravenous Once  . famotidine (PEPCID) IV  20 mg Intravenous Q24H  . haloperidol lactate  5 mg Intravenous Once  . heparin  5,000 Units Subcutaneous Q8H  . hydrocortisone sod succinate (SOLU-CORTEF) inj  25 mg Intravenous Daily  . LORazepam  2 mg Intravenous Q6H  . mouth rinse  15 mL Mouth Rinse q12n4p  . OLANZapine zydis  10 mg Oral BID WC  . sodium chloride flush  10-40 mL Intracatheter Q12H  . sodium chloride flush  3 mL Intravenous Q12H  . tuberculin  5 Units Intradermal Once   sodium chloride, sodium chloride, acetaminophen, LORazepam, ondansetron (ZOFRAN) IV  Assessment/ Plan:  45 y.o.white female with a PMHx of Crohn's disease, depression, diabetes mellitus type 2, GERD, hiatal hernia, hyperlipidemia, hypertension, hypothyroidism, obesity, schizophrenia, traumatic brain injury, vitamin D deficiency, who was admitted to [facility] on 09/27/2016   1.  Acute renal failure with hyperkalemia and metabolic acidosis: CRRT  from 11/29 to 12/5 Acute renal failure sepsis, hypotension, and hypoxemia Nonoliguric urine output. However creatinine continues to rise - Plan on hemodialysis with no UF today. Orders prepared.  No known baseline creatinine in last 3 years.   2.  Hypernatremia: concern for free water deficit - D5 in sterile water.   3. Sepsis: cultures negative - off vasopressin and other pressors - solucortef -- completed antibiotics.   4. Nutrition - IV albumin    LOS: 10  [name] 12/8/201710:20 AM

## 2016-10-07 NOTE — Progress Notes (Signed)
Post HD assessment unchanged  

## 2016-10-07 NOTE — Progress Notes (Signed)
Received report from dialysis. Patient did well and is returning to unit.

## 2016-10-07 NOTE — Progress Notes (Signed)
HD 12.8. 2017- other dialysis 11/29 - 12/6 was CRRT    10/07/16 1438 10/07/16 1500 10/07/16 1530  During Hemodialysis Assessment  Blood Flow Rate (mL/min) 400 mL/min 400 mL/min 400 mL/min  Arterial Pressure (mmHg) -130 mmHg -140 mmHg -140 mmHg  Venous Pressure (mmHg) 130 mmHg 140 mmHg 150 mmHg  Transmembrane Pressure (mmHg) 40 mmHg 20 mmHg 20 mmHg  Ultrafiltration Rate (mL/min) 170 mL/min 170 mL/min 170 mL/min  Dialysate Flow Rate (mL/min) 600 ml/min 600 ml/min 600 ml/min  Conductivity: Machine  13.7 13.6 14  HD Safety Checks Performed Yes Yes Yes  Dialysis Fluid Bolus Normal Saline --  --   Bolus Amount (mL) 250 mL --  --   Dialysate Change (3k 2.5ca) --  --   Intra-Hemodialysis Comments 250cc prime, initiated via L HD cath without issue. Hepatitis labs sent. Pt confused. Unable to answer questions appropriatly. VS stable 76. sleeping 146

## 2016-10-07 NOTE — Clinical Social Work Note (Signed)
Patient is from [place], [name] is the owner her contact number is [phone], group home plans to accept her back pending PT recommendations, once she is medically ready for discharge and orders have been received.  MSW to continue to follow patient's progress throughout discharge planning.  [name]. [name], MSW [phone]  Mon-Fri 8a-4:30p 10/07/2016 5:48 PM
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
## 2016-10-07 NOTE — Plan of Care (Signed)
Problem: SLP Dysphagia Goals Goal: Misc Dysphagia Goal Pt will safely tolerate po diet of least restrictive consistency w/ no overt s/s of aspiration noted by Staff/pt/family x3 sessions.    

## 2016-10-07 NOTE — Care Management (Signed)
Spoke with nephrology regarding plans for ongoing dialysis.  Is considering perm cath placement and most likely will require additional treatments.  Clinic preference would be [facility] Hepatitis antigens drawn and PPD placed today.  have discussed during progression that when patient is to receive dialysis treatments to have her sit in chair.  Requested order for PT/OT from attending. Sent initial referral information to [facility] with [facility].

## 2016-10-07 NOTE — Progress Notes (Signed)
Pt. Has been refusing CPT, or allowing only a few minutes and gets upset during CPT. There has not been a chest xray since 10/02/16. Therefore the CPT has been discontinued.

## 2016-10-07 NOTE — Progress Notes (Signed)
Pre dialysis assessment 

## 2016-10-07 NOTE — Progress Notes (Signed)
HD completed without issue. No UF per orders. Report called to primary RN

## 2016-10-07 NOTE — Progress Notes (Signed)
   10/07/16 1630  During Hemodialysis Assessment  Blood Flow Rate (mL/min) 400 mL/min  Arterial Pressure (mmHg) -140 mmHg  Venous Pressure (mmHg) 150 mmHg  Transmembrane Pressure (mmHg) 20 mmHg  Ultrafiltration Rate (mL/min) 170 mL/min  Dialysate Flow Rate (mL/min) 600 ml/min  Conductivity: Machine  13.6  HD Safety Checks Performed Yes  Intra-Hemodialysis Comments 325. no change

## 2016-10-07 NOTE — Progress Notes (Signed)
PHARMACIST - PHYSICIAN COMMUNICATION  DR:   Bridgett Larsson  CONCERNING: IV to Oral Route Change Policy  RECOMMENDATION: This patient is receiving famotidine by the intravenous route.  Based on criteria approved by the Pharmacy and Therapeutics Committee, the intravenous medication(s) is/are being converted to the equivalent oral dose form(s).   DESCRIPTION: These criteria include:  The patient is eating (either orally or via tube) and/or has been taking other orally administered medications for a least 24 hours  The patient has no evidence of active gastrointestinal bleeding or impaired GI absorption (gastrectomy, short bowel, patient on TNA or NPO).  If you have questions about this conversion, please contact the [place], [facility] 10/07/2016 10:24 AM

## 2016-10-07 NOTE — Progress Notes (Addendum)
Nutrition Follow-up  DOCUMENTATION CODES:   Morbid obesity  INTERVENTION:   Magic cup TID with meals, each supplement provides 290 kcal and 9 grams of protein  Assist with feeding  Ensure Enlive po BID, each supplement provides 350 kcal and 20 grams of protein  NUTRITION DIAGNOSIS:   Inadequate oral intake related to dysphagia, acute illness as evidenced by meal completion < 50%.  GOAL:   Patient will meet greater than or equal to 90% of their needs  MONITOR:   PO intake, Supplement acceptance  ASSESSMENT:   45 y.o.white female with a PMHx of Crohn's disease, depression, diabetes mellitus type 2, GERD, hiatal hernia, hyperlipidemia, hypertension, hypothyroidism, obesity, schizophrenia, traumatic brain injury, vitamin D deficiency, who was admitted to [facility] on 09/27/2016 with septic shock secondary to UTI, severe metabolic acidosis, acute encephalopathy and acute respiratory failure   Met with pt in room today. Pt in good spirits, laughing with some visitors. Pt reports that her appetite is good and she is eating well. Spoke to RN pt eating about 50% meals. RN reports that pt did not eat her Magic Cup today unsure about prior to today. No N/V per pt. Per chart, pt still having diarrhea. Pt receiving HD prn. Pt's BUN/creat elevated today. Nephrology following. Pt had SLP evaluation today, Advanced to thin liquids. Will order Ensure.     Medications reviewed and include: peridex, celexa, epogen, pepcid, haldol, heparin, olanzapine, Dextrose 5% IV   Labs reviewed: Na 149(H), K 3.1(L), Cl 166(H), BUN 37(H), creat 4.19(H), Ca 8.0 adj. 9.36 wnl, P 4.3 wnl, Alb 2.3(L) WBC 17.2(H), Hgb 7.8(L), Hct 23.6(L)  Diet Order:  DIET - DYS 1 Room service appropriate? No; Fluid consistency: Honey Thick  Skin:  Wound (see comment) (head laceration )  Last BM:  12/8  Height:   Ht Readings from Last 1 Encounters:  09/27/16 _0  (1.676 m)    Weight:   Wt Readings from Last 1 Encounters:   10/07/16 273 lb 4.8 oz (124 kg)    Ideal Body Weight:  61.3 kg  BMI:  Body mass index is 44.11 kg/m.  Estimated Nutritional Needs:   Kcal:  2200-2500 kcals  Protein:  >/= 120 g  Fluid:  >/= 1.7 L  EDUCATION NEEDS:   Education needs no appropriate at this time  Koleen Distance, RD, LDN

## 2016-10-07 NOTE — Progress Notes (Signed)
Pharmacy Antibiotic   [name] is a 45 y.o. female admitted on 09/27/2016 with sepsis from unknown source with severe metabolic acidosis and acute encephalopathy. Pharmacy has been consulted for piperacillin/tazobactam dosing.  This is day #10 of antibiotic therapy on piperacillin/tazobactam. Patient is now on intermittent HD.  Plan: Continue piperacillin/tazobactam 3.375 g IV q12h  Height: 5\' 6"  (167.6 cm) Weight: 273 lb 4.8 oz (124 kg) IBW/kg (Calculated) : 59.3  Temp (24hrs), Avg:98.5 F (36.9 C), Min:98.3 F (36.8 C), Max:99 F (37.2 C)   Recent Labs Lab 10/03/16 0406  10/04/16 0423 10/04/16 0807 10/05/16 0427 10/06/16 0457 10/07/16 0434  WBC 25.1*  --  24.8*  --  19.1* 24.2* 17.2*  CREATININE 1.58*  < > 1.11* 1.16* 2.17* 3.28* 4.19*  < > = values in this interval not displayed.  Estimated Creatinine Clearance: 22.8 mL/min (by C-G formula based on SCr of 4.19 mg/dL (H)).    Allergies  Allergen Reactions  . Peanut Oil Other (See Comments)    Face turns red  . Risperidone And Related Cough    Antimicrobials this admission: Vancomycin 11/28 >> 12/1 Piperacillin/tazo 11/28 >>   Microbiology results: 11/28 BCx: No growth final 11/28 UCx: No growth final but collected after antibiotic administration 11/28 MRSA PCR: negative  Thank you for allowing pharmacy to be a part of this patient's care.  Lenis Noon, PharmD, BCPS Clinical Pharmacist 10/07/2016 10:27 AM

## 2016-10-07 NOTE — Progress Notes (Signed)
[place] at [place] NAME: [name]    MR#:  [id number]  DATE OF BIRTH:  [birth year]  SUBJECTIVE:  CHIEF COMPLAINT:  No chief complaint on file.  The patient has no complaints, she is confused. REVIEW OF SYSTEMS:  Review of Systems  Unable to perform ROS: Acuity of condition    DRUG ALLERGIES:   Allergies  Allergen Reactions  . Peanut Oil Other (See Comments)    Face turns red  . Risperidone And Related Cough   VITALS:  Blood pressure (!) 93/56, pulse 95, temperature 98.1 F (36.7 C), temperature source Oral, resp. rate (!) 21, height 5\' 6"  (1.676 m), weight 280 lb 8 oz (127.2 kg), SpO2 97 %. PHYSICAL EXAMINATION:  Physical Exam  Constitutional: She is well-developed, well-nourished, and in no distress.  Morbid obesity.  HENT:  Head: Normocephalic.  Mouth/Throat: Oropharynx is clear and moist.  Eyes: Conjunctivae and EOM are normal.  Neck: Normal range of motion. Neck supple. No JVD present. No tracheal deviation present.  Cardiovascular: Normal rate, regular rhythm and normal heart sounds.   Pulmonary/Chest: Effort normal and breath sounds normal. No respiratory distress. She has no wheezes. She has no rales.  Abdominal: Soft. Bowel sounds are normal. She exhibits no distension. There is no tenderness.  Musculoskeletal: She exhibits no edema or tenderness.  Neurological: She is alert. No cranial nerve deficit.  Confused but follow commands.  Skin: No rash noted. No erythema.   LABORATORY PANEL:   CBC  Recent Labs Lab 10/07/16 0434  WBC 17.2*  HGB 7.8*  HCT 23.6*  PLT 239   ------------------------------------------------------------------------------------------------------------------ Chemistries   Recent Labs Lab 10/06/16 0457 10/07/16 0434  NA 147* 149*  K 3.3* 3.1*  CL 115* 116*  CO2 25 24  GLUCOSE 118* 107*  BUN 30* 37*  CREATININE 3.28* 4.19*  CALCIUM 8.1* 8.0*  MG 1.8  --    RADIOLOGY:    No results found. ASSESSMENT AND PLAN:   45 yo obese white female admitted to the ICU for septic shock?UTI with severe metabolic acidosis, acute encephalopathy and acute respiratory failure;  Status post extubation. ARF on CRRT.   Acute respiratory failure--improved. Extubated 12/1 off nasal cannula at 2 L.  Severe septic shock, blood pressure improved but still in low side, Off vasopressor. She has been treated with Zosyn for 10 days, discontinue antibiotics. ECHO with normal LVEF of approximately 60% Continue Solu-Cortef.  AKI possibly related to severe sepsis s/p  CRRT, also started on albumin by nephrology. Hyperkalemia-resolved Hypocalcemia Hypophosphatemia Now on hemodialysis. Follow up with nephrologist for outpatient dialysis setup.  Leukocytosis, likely due to steroid.  Acute encephalopathy, delirium related to severe sepsis. Severe agitation with underlying schizophrenia. -Psychiatry following.  All the records are reviewed and case discussed with Care Management/Social Worker. Management plans discussed with the patient's aunt, and they are in agreement.  CODE STATUS: Full code  TOTAL TIME TAKING CARE OF THIS PATIENT: 39 minutes.   More than 50% of the time was spent in counseling/coordination of care: YES  POSSIBLE D/C IN >3 DAYS, DEPENDING ON CLINICAL CONDITION.   Demetrios Loll M.D on 10/07/2016 at 3:18 PM  Between 7am to 6pm - Pager - [phone]  After 6pm go to [email or web address] - Proofreader  [facility]  Office  [phone]  CC: Primary care physician; Lorelee Market, MD  Note: This dictation was prepared with Dragon dictation along with smaller phrase technology. Any transcriptional errors  that result from this process are unintentional.

## 2016-10-08 LAB — GLUCOSE, CAPILLARY
GLUCOSE-CAPILLARY: 77 mg/dL (ref 65–99)
GLUCOSE-CAPILLARY: 88 mg/dL (ref 65–99)
Glucose-Capillary: 118 mg/dL — ABNORMAL HIGH (ref 65–99)
Glucose-Capillary: 79 mg/dL (ref 65–99)
Glucose-Capillary: 86 mg/dL (ref 65–99)
Glucose-Capillary: 88 mg/dL (ref 65–99)
Glucose-Capillary: 91 mg/dL (ref 65–99)

## 2016-10-08 LAB — HEPATITIS B CORE ANTIBODY, TOTAL: Hep B Core Total Ab: NEGATIVE

## 2016-10-08 LAB — GASTROINTESTINAL PANEL BY PCR, STOOL (REPLACES STOOL CULTURE)
ADENOVIRUS F40/41: NOT DETECTED
Astrovirus: NOT DETECTED
CRYPTOSPORIDIUM: NOT DETECTED
Campylobacter species: NOT DETECTED
Cyclospora cayetanensis: NOT DETECTED
ENTEROAGGREGATIVE E COLI (EAEC): NOT DETECTED
ENTEROPATHOGENIC E COLI (EPEC): NOT DETECTED
Entamoeba histolytica: NOT DETECTED
Enterotoxigenic E coli (ETEC): NOT DETECTED
GIARDIA LAMBLIA: NOT DETECTED
Norovirus GI/GII: NOT DETECTED
Plesimonas shigelloides: NOT DETECTED
ROTAVIRUS A: NOT DETECTED
Salmonella species: NOT DETECTED
Sapovirus (I, II, IV, and V): NOT DETECTED
Shiga like toxin producing E coli (STEC): NOT DETECTED
Shigella/Enteroinvasive E coli (EIEC): NOT DETECTED
VIBRIO CHOLERAE: NOT DETECTED
VIBRIO SPECIES: NOT DETECTED
YERSINIA ENTEROCOLITICA: NOT DETECTED

## 2016-10-08 LAB — BASIC METABOLIC PANEL
Anion gap: 7 (ref 5–15)
BUN: 21 mg/dL — ABNORMAL HIGH (ref 6–20)
CALCIUM: 7.6 mg/dL — AB (ref 8.9–10.3)
CHLORIDE: 105 mmol/L (ref 101–111)
CO2: 30 mmol/L (ref 22–32)
CREATININE: 3.44 mg/dL — AB (ref 0.44–1.00)
GFR calc Af Amer: 17 mL/min — ABNORMAL LOW (ref >60)
GFR calc non Af Amer: 15 mL/min — ABNORMAL LOW (ref >60)
GLUCOSE: 92 mg/dL (ref 65–99)
Potassium: 3.3 mmol/L — ABNORMAL LOW (ref 3.5–5.1)
Sodium: 142 mmol/L (ref 135–145)

## 2016-10-08 LAB — HEPATITIS B SURFACE ANTIBODY, QUANTITATIVE

## 2016-10-08 LAB — HEPATITIS B SURFACE ANTIBODY,QUALITATIVE: HEP B S AB: NONREACTIVE

## 2016-10-08 LAB — HEPATITIS B SURFACE ANTIGEN: HEP B S AG: NEGATIVE

## 2016-10-08 MED ORDER — NEPRO/CARBSTEADY PO LIQD
237.0000 mL | Freq: Three times a day (TID) | ORAL | Status: DC
  Administered 2016-10-08 – 2016-10-11 (×7): 237 mL via ORAL

## 2016-10-08 NOTE — Progress Notes (Signed)
Patient has a rectal  Tube in but no order for it . Dr. Estanislado Pandy notified with a new order for rectal tube. Patient pulled her rectal tube and another one re-inserted this shift. Still confused and tries to get out of bed whenever she wakes up. No acute distress noted. Will continue to monitor.

## 2016-10-08 NOTE — Evaluation (Signed)
Occupational Therapy Evaluation Patient Details Name: [name] MRN: [id number] DOB: 1970-11-24 Today's Date: 10/08/2016    History of Present Illness Pt. is a 45 y.o. female who was admiited to [facility] after being found unresponsive in her group home. Pt. became unresponsive after hitting her head on a dresser in the night. Pt. has septic shock from a UTI, severs metabolic acidosis, acute encephalopathy, with acute respiratory failure. Pt. has a history of Schizophrenia.   Clinical Impression   Pt. Presents with lethargy. Intermittently alerts in response to verbal stimuli. Pt. activity level, and ADL tasks are limited by a rectal foley/tube, port lines, and lethargy. Pt. Requires extensive assist with ADL tasks at this time. Pt. conitnues to benefit from skilled OT services for ADL training, A/E training, there. Ex., and pt./familiy education. Pt. Was previously residing in a group home.      Follow Up Recommendations  Home health OT    Equipment Recommendations       Recommendations for Other Services       Precautions / Restrictions Precautions Precautions: Fall Restrictions Weight Bearing Restrictions: No      Mobility Bed Mobility Overal bed mobility: Needs Assistance Bed Mobility: Rolling Rolling: Mod assist                                 Balance                                            ADL  Pt. Reports feeding self breakfast this a.m. Pt. Aunt fed pt. Lunch secondary to pt. Having to keep her RUE straight. Pt. Requires extensive assist with ADL tasks.                                             Vision     Perception     Praxis      Pertinent Vitals/Pain Pain Assessment: 0-10 Pain Score: 0-No pain Faces Pain Scale: Hurts little more Pain Location: Buttock Pain Intervention(s): Monitored during session;Limited activity within patient's tolerance     Hand Dominance Right   Extremity/Trunk  Assessment Upper Extremity Assessment Upper Extremity Assessment: Generalized weakness;RUE deficits/detail RUE Deficits / Details: Family reports nursing instructed pt. to keep right UE straight secondary to a line being in place.       Communication Communication Communication: No difficulties   Cognition Arousal/Alertness: Lethargic Behavior During Therapy: Flat affect Overall Cognitive Status: Difficult to assess                    General Comments       Exercises     Shoulder Instructions      Home Living Family/patient expects to be discharged to:: Group home                                        Prior Functioning/Environment Level of Independence: Needs assistance               OT Problem List: Decreased strength;Impaired UE functional use;Decreased knowledge of use of DME or AE;Decreased activity tolerance  OT Treatment/Interventions: Self-care/ADL training;Therapeutic exercise;Therapeutic activities;Patient/family education;DME and/or AE instruction;Manual therapy;Neuromuscular education    OT Goals(Current goals can be found in the care plan section) Acute Rehab OT Goals Patient Stated Goal: To get better OT Goal Formulation: With patient Potential to Achieve Goals: Good  OT Frequency: Min 1X/week   Barriers to D/C:            Co-evaluation              End of Session    Activity Tolerance: Patient limited by lethargy Patient left: in bed;with call bell/phone within reach;with bed alarm set;with family/visitor present   Time: 1210-1230 OT Time Calculation (min): 20 min Charges:  OT General Charges $OT Visit: 1 Procedure OT Evaluation $OT Eval Low Complexity: 1 Procedure G-Codes:    Harrel Carina, MS, OTR/L 10/08/2016, 12:48 PM

## 2016-10-08 NOTE — Progress Notes (Signed)
Physical Therapy Evaluation Patient Details Name: [name] MRN: [id number] DOB: 1971/08/30 Today's Date: 10/08/2016   History of Present Illness  Patient is a 45 y.o. female admitted on 28 NOV after being found unresponsive in group home bathroom after hitting her head on her dresser in the night. Patient diagnosed with acute septic shock from UTI, severe metabolic acidosis, and acute encephalopathy w/acute respiratory failure. PMH includes schizophrenia.  Clinical Impression  MD present upon PT arrival for evaluation. PT reassessed BP prior to beginning evaluation, 109/60 mmHg. Patient previously modified independent in short distances with RW at school and without AD at group home. Upon evaluation, patient complaining of buttock pain. PT able to assess some bed mobility but other mobility deferred due to rectal foley and pain. Patient required moderate assistance to perform rolling tasks and gentle exercises. Because of this, it is deemed that patient is not at baseline level of function and will continue to benefit from skilled and progressive PT during her hospital stay and upon discharge. It is recommended that patient attend a SNF for functional mobility training to return to PLOF.    Follow Up Recommendations SNF    Equipment Recommendations       Recommendations for Other Services       Precautions / Restrictions Precautions Precautions: Fall Restrictions Weight Bearing Restrictions: No      Mobility  Bed Mobility Overal bed mobility: Needs Assistance Bed Mobility: Rolling Rolling: Mod assist         General bed mobility comments: Patient required moderate assistance to rule L. Deferred other mobility testing due to rectal foley.  Transfers                    Ambulation/Gait                Stairs            Wheelchair Mobility    Modified Rankin (Stroke Patients Only)       Balance                                              Pertinent Vitals/Pain Pain Assessment: Faces Faces Pain Scale: Hurts little more Pain Location: Buttock Pain Intervention(s): Monitored during session;Limited activity within patient's tolerance    Home Living Family/patient expects to be discharged to:: Group home                      Prior Function Level of Independence: Needs assistance   Gait / Transfers Assistance Needed: Patient ambulates short distances with RW or no AD  ADL's / Homemaking Assistance Needed: Performed at group home        Hand Dominance        Extremity/Trunk Assessment   Upper Extremity Assessment: Generalized weakness           Lower Extremity Assessment: Generalized weakness         Communication   Communication: No difficulties  Cognition Arousal/Alertness: Awake/alert Behavior During Therapy: WFL for tasks assessed/performed Overall Cognitive Status: Difficult to assess                 General Comments: Alert and oriented x2    General Comments      Exercises General Exercises - Lower Extremity Ankle Circles/Pumps: AAROM;10 reps Quad Sets: AAROM;10 reps Gluteal Sets: AAROM;10 reps Straight  Leg Raises: AAROM;10 reps   Assessment/Plan    PT Assessment Patient needs continued PT services  PT Problem List Decreased strength;Decreased range of motion;Decreased activity tolerance;Decreased mobility;Decreased cognition;Decreased knowledge of use of DME;Decreased safety awareness;Pain;Obesity          PT Treatment Interventions DME instruction;Gait training;Functional mobility training;Therapeutic activities;Therapeutic exercise;Cognitive remediation;Patient/family education    PT Goals (Current goals can be found in the Care Plan section)  Acute Rehab PT Goals Patient Stated Goal: "To get better" PT Goal Formulation: With patient Time For Goal Achievement: 10/22/16 Potential to Achieve Goals: Fair    Frequency Min 2X/week   Barriers to  discharge Decreased caregiver support      Co-evaluation               End of Session   Activity Tolerance: Patient tolerated treatment well;Patient limited by pain Patient left: in bed;with call bell/phone within reach;with bed alarm set           Time: 1040-1100 PT Time Calculation (min) (ACUTE ONLY): 20 min   Charges:   PT Evaluation $PT Eval Low Complexity: 1 Procedure PT Treatments $Therapeutic Exercise: 8-22 mins   PT G Codes:        Dorice Lamas, PT, DPT 10/08/2016, 11:55 AM

## 2016-10-08 NOTE — Progress Notes (Signed)
[place] at [place] NAME: [name]    MR#:  [id number]  DATE OF BIRTH:  1971-06-10  SUBJECTIVE:  CHIEF COMPLAINT:  No chief complaint on file.  The patient has no complaints, she is confused.Loose stools, rectal tube is placed, C. difficile was tested and negative REVIEW OF SYSTEMS:  Review of Systems  Unable to perform ROS: Acuity of condition    DRUG ALLERGIES:   Allergies  Allergen Reactions  . Peanut Oil Other (See Comments)    Face turns red  . Risperidone And Related Cough   VITALS:  Blood pressure (!) 92/44, pulse 93, temperature 98.2 F (36.8 C), temperature source Oral, resp. rate 16, height 5\' 6"  (1.676 m), weight 122.7 kg (270 lb 9.6 oz), SpO2 90 %. PHYSICAL EXAMINATION:  Physical Exam  Constitutional: She is well-developed, well-nourished, and in no distress.  Morbid obesity.  HENT:  Head: Normocephalic.  Mouth/Throat: Oropharynx is clear and moist.  Eyes: Conjunctivae and EOM are normal.  Neck: Normal range of motion. Neck supple. No JVD present. No tracheal deviation present.  Cardiovascular: Normal rate, regular rhythm and normal heart sounds.   Pulmonary/Chest: Effort normal and breath sounds normal. No respiratory distress. She has no wheezes. She has no rales.  Abdominal: Soft. Bowel sounds are normal. She exhibits no distension. There is no tenderness.  Musculoskeletal: She exhibits no edema or tenderness.  Neurological: She is alert. No cranial nerve deficit.  Confused but follow commands.  Skin: No rash noted. No erythema.   LABORATORY PANEL:   CBC  Recent Labs Lab 10/07/16 0434  WBC 17.2*  HGB 7.8*  HCT 23.6*  PLT 239   ------------------------------------------------------------------------------------------------------------------ Chemistries   Recent Labs Lab 10/06/16 0457  10/08/16 0545  NA 147*  < > 142  K 3.3*  < > 3.3*  CL 115*  < > 105  CO2 25  < > 30  GLUCOSE 118*  < >  92  BUN 30*  < > 21*  CREATININE 3.28*  < > 3.44*  CALCIUM 8.1*  < > 7.6*  MG 1.8  --   --   < > = values in this interval not displayed. RADIOLOGY:  No results found. ASSESSMENT AND PLAN:   45 yo obese white female admitted to the ICU for septic shock?UTI with severe metabolic acidosis, acute encephalopathy and acute respiratory failure;  Status post extubation. ARF on CRRT. All cultures are negative so far.  Acute respiratory failure with hypoxia--improved. Extubated 12/1 off nasal cannula, now on room air with O2 sats of 90-96%  Severe septic shock, blood pressure improved but still in low side, Off vasopressor. She has been treated with Zosyn for 10 days, now off antibiotics. ECHO with normal LVEF of approximately 60% Continue Solu-Cortef.  AKI possibly related to severe sepsis s/p  CRRT, also started on albumin by nephrology. PermCath is being planned, if further needed dialysis.   Hyperkalemia-resolved Hypocalcemia Hypophosphatemia Now on hemodialysis. Follow up with nephrologist for outpatient dialysis setup.  Leukocytosis, likely due to steroid, follow in the morning.  Acute encephalopathy, delirium related to severe sepsis. Severe agitation with underlying schizophrenia. -Psychiatry following.  Diarrhea, status post rectal tube placement, C. difficile negative, get gastrointestinal panel, contact precautions, discussed with patient's family, all questions were answered, voiced understanding   All the records are reviewed and case discussed with Care Management/Social Worker. Management plans discussed with the patient's aunt, and they are in agreement.  CODE STATUS: Full  code  TOTAL TIME TAKING CARE OF THIS PATIENT: 40 minutes.   More than 50% of the time was spent in counseling/coordination of care: YES  POSSIBLE D/C IN >3 DAYS, DEPENDING ON CLINICAL CONDITION.   Theodoro Grist M.D on 10/08/2016 at 1:05 PM  Between 7am to 6pm - Pager - [phone]  After  6pm go to [email or web address] - Proofreader  [facility]  Office  [phone]  CC: Primary care physician; Lorelee Market, MD  Note: This dictation was prepared with Dragon dictation along with smaller phrase technology. Any transcriptional errors that result from this process are unintentional.

## 2016-10-08 NOTE — Progress Notes (Signed)
[facility]  ROUNDING NOTE   Subjective:   Aunt and Uncle at bedside.  More alert and oriented today.   Hemodialysis treatment yesterday. No UF. UOP 1400  D5 at 75mL/hr  Objective:  Vital signs in last 24 hours:  Temp:  [97.9 F (36.6 C)-99.1 F (37.3 C)] 98.5 F (36.9 C) (12/09 0355) Pulse Rate:  [94-105] 99 (12/09 0355) Resp:  [17-23] 18 (12/09 0355) BP: (91-110)/(41-60) 94/49 (12/09 0355) SpO2:  [92 %-100 %] 92 % (12/09 0355) Weight:  [122.7 kg (270 lb 9.6 oz)-127.2 kg (280 lb 8 oz)] 122.7 kg (270 lb 9.6 oz) (12/09 0500)  Weight change: 3.266 kg (7 lb 3.2 oz) Filed Weights   10/07/16 0335 10/07/16 1435 10/08/16 0500  Weight: 124 kg (273 lb 4.8 oz) 127.2 kg (280 lb 8 oz) 122.7 kg (270 lb 9.6 oz)    Intake/Output: I/O last 3 completed shifts: In: 703 [I.V.:3; Other:700] Out: 2949 [Urine:1950; Stool:1000]   Intake/Output this shift:  Total I/O In: 120 [P.O.:120] Out: -   Physical Exam: General: No acute distress  Head: 2cm laceration occiput. OM moist  Eyes: Anicteric  Neck: Supple, trachea midline  Lungs:  clear, normal effort   Heart: S1S2 No rubs   Abdomen:  Umbilical hernia +, BS present   Extremities: Trace peripheral edema.  Neurologic: sedated  Skin: No lesions  Access: L IJ temporary dialysis catheter [id number]    Basic Metabolic Panel:  Recent Labs Lab 10/04/16 0020 10/04/16 0423 10/04/16 0807 10/05/16 0427 10/06/16 0457 10/07/16 0434 10/08/16 0545  NA 141 141 142 141 147* 149* 142  K 3.8 3.9 4.0 3.2* 3.3* 3.1* 3.3*  CL 107 107 110 108 115* 116* 105  CO2 28 29 27 26 25 24 30   GLUCOSE 107* 99 92 94 118* 107* 92  BUN 19 20 18  27* 30* 37* 21*  CREATININE 1.24* 1.11* 1.16* 2.17* 3.28* 4.19* 3.44*  CALCIUM 8.4* 8.4* 8.5* 8.0* 8.1* 8.0* 7.6*  MG 1.8 1.7 1.7 1.7 1.8  --   --   PHOS 1.9* 2.2* 1.9* 3.2 4.2 4.3  --     Liver Function Tests:  Recent Labs Lab 10/04/16 0423 10/04/16 0807 10/05/16 0427 10/06/16 0457  10/07/16 0434  ALBUMIN 2.4* 2.4* 2.3* 2.5* 2.3*   No results for input(s): LIPASE, AMYLASE in the last 168 hours. No results for input(s): AMMONIA in the last 168 hours.  CBC:  Recent Labs Lab 10/03/16 0406 10/04/16 0423 10/05/16 0427 10/06/16 0457 10/07/16 0434  WBC 25.1* 24.8* 19.1* 24.2* 17.2*  NEUTROABS  --   --   --   --  11.0*  HGB 9.1* 8.8* 7.9* 8.9* 7.8*  HCT 27.4* 26.5* 23.6* 27.2* 23.6*  MCV 91.8 93.4 91.9 92.9 93.4  PLT 149* 146* 150 235 239    Cardiac Enzymes: No results for input(s): CKTOTAL, CKMB, CKMBINDEX, TROPONINI in the last 168 hours.  BNP: Invalid input(s): POCBNP  CBG:  Recent Labs Lab 10/07/16 1145 10/07/16 2006 10/08/16 0012 10/08/16 0404 10/08/16 0811  GLUCAP 112* 85 88 79 91    Microbiology: Results for orders placed or performed during the hospital encounter of 09/27/16  Blood Culture (routine x 2)     Status: None   Collection Time: 09/27/16  6:02 AM  Result Value Ref Range Status   Specimen Description BLOOD  R AC  Final   Special Requests   Final    BOTTLES DRAWN AEROBIC AND ANAEROBIC  AER 3 ML ANA 4 ML  Culture NO GROWTH 5 DAYS  Final   Report Status 10/02/2016 FINAL  Final  Blood Culture (routine x 2)     Status: None   Collection Time: 09/27/16  6:03 AM  Result Value Ref Range Status   Specimen Description BLOOD  R ARM  Final   Special Requests   Final    BOTTLES DRAWN AEROBIC AND ANAEROBIC  AER 6 ML ANA 10 ML   Culture NO GROWTH 5 DAYS  Final   Report Status 10/02/2016 FINAL  Final  Urine culture     Status: None   Collection Time: 09/27/16  9:30 AM  Result Value Ref Range Status   Specimen Description URINE, RANDOM  Final   Special Requests NONE  Final   Culture NO GROWTH Performed at [facility]   Final   Report Status 09/28/2016 FINAL  Final  MRSA PCR Screening     Status: None   Collection Time: 09/27/16 11:09 AM  Result Value Ref Range Status   MRSA by PCR NEGATIVE NEGATIVE Final    Comment:         The GeneXpert MRSA Assay (FDA approved for NASAL specimens only), is one component of a comprehensive MRSA colonization surveillance program. It is not intended to diagnose MRSA infection nor to guide or monitor treatment for MRSA infections.   C difficile quick scan w PCR reflex     Status: None   Collection Time: 10/04/16 12:20 AM  Result Value Ref Range Status   C Diff antigen NEGATIVE NEGATIVE Final   C Diff toxin NEGATIVE NEGATIVE Final   C Diff interpretation No C. difficile detected.  Final    Coagulation Studies: No results for input(s): LABPROT, INR in the last 72 hours.  Urinalysis: No results for input(s): COLORURINE, LABSPEC, PHURINE, GLUCOSEU, HGBUR, BILIRUBINUR, KETONESUR, PROTEINUR, UROBILINOGEN, NITRITE, LEUKOCYTESUR in the last 72 hours.  Invalid input(s): APPERANCEUR    Imaging: No results found.   Medications:   . dextrose 50 mL/hr at 10/07/16 0903   . bacitracin   Topical BID  . chlorhexidine  15 mL Mouth Rinse BID  . citalopram  40 mg Oral Daily  . cloZAPine  200 mg Oral Daily  . epoetin (EPOGEN/PROCRIT) injection  10,000 Units Intravenous Once  . famotidine  20 mg Oral Daily  . feeding supplement (ENSURE ENLIVE)  237 mL Oral BID BM  . feeding supplement (NEPRO CARB STEADY)  237 mL Oral TID BM  . haloperidol lactate  5 mg Intravenous Once  . heparin  5,000 Units Subcutaneous Q8H  . hydrocortisone sod succinate (SOLU-CORTEF) inj  25 mg Intravenous Daily  . LORazepam  2 mg Intravenous Q6H  . mouth rinse  15 mL Mouth Rinse q12n4p  . OLANZapine zydis  10 mg Oral BID WC  . sodium chloride flush  10-40 mL Intracatheter Q12H  . sodium chloride flush  3 mL Intravenous Q12H  . tuberculin  5 Units Intradermal Once   sodium chloride, sodium chloride, acetaminophen, LORazepam, ondansetron (ZOFRAN) IV  Assessment/ Plan:  45 y.o.white female with a PMHx of Crohn's disease, depression, diabetes mellitus type 2, GERD, hiatal hernia, hyperlipidemia,  hypertension, hypothyroidism, obesity, schizophrenia, traumatic brain injury, vitamin D deficiency, who was admitted to [facility] on 09/27/2016   1.  Acute renal failure with hyperkalemia and metabolic acidosis: CRRT from 11/29 to 12/5 Acute renal failure sepsis, hypotension, and hypoxemia No baseline creatinine for 3 years.  Nonoliguric urine output.  Hemodialysis treatment on 12/8 If further hemodialysis treatments  required, plan on permcath placement.   2.  Hypernatremia: improved with dialysis treatment - currently on D5 gtt  3. Sepsis: cultures negative - off vasopressin and other pressors - solucortef -- completed antibiotics.    LOS: [place], [place] 12/9/201711:02 AM

## 2016-10-09 LAB — RENAL FUNCTION PANEL
ALBUMIN: 1.9 g/dL — AB (ref 3.5–5.0)
Anion gap: 6 (ref 5–15)
BUN: 32 mg/dL — AB (ref 6–20)
CALCIUM: 7.5 mg/dL — AB (ref 8.9–10.3)
CO2: 28 mmol/L (ref 22–32)
Chloride: 102 mmol/L (ref 101–111)
Creatinine, Ser: 4.71 mg/dL — ABNORMAL HIGH (ref 0.44–1.00)
GFR calc Af Amer: 12 mL/min — ABNORMAL LOW (ref >60)
GFR, EST NON AFRICAN AMERICAN: 10 mL/min — AB (ref >60)
GLUCOSE: 99 mg/dL (ref 65–99)
PHOSPHORUS: 4.5 mg/dL (ref 2.5–4.6)
Potassium: 3.2 mmol/L — ABNORMAL LOW (ref 3.5–5.1)
SODIUM: 136 mmol/L (ref 135–145)

## 2016-10-09 LAB — CBC
HEMATOCRIT: 21.3 % — AB (ref 35.0–47.0)
Hemoglobin: 7.1 g/dL — ABNORMAL LOW (ref 12.0–16.0)
MCH: 31 pg (ref 26.0–34.0)
MCHC: 33.1 g/dL (ref 32.0–36.0)
MCV: 93.4 fL (ref 80.0–100.0)
PLATELETS: 313 10*3/uL (ref 150–440)
RBC: 2.28 MIL/uL — ABNORMAL LOW (ref 3.80–5.20)
RDW: 14.6 % — AB (ref 11.5–14.5)
WBC: 13.9 10*3/uL — AB (ref 3.6–11.0)

## 2016-10-09 LAB — GLUCOSE, CAPILLARY
GLUCOSE-CAPILLARY: 107 mg/dL — AB (ref 65–99)
GLUCOSE-CAPILLARY: 96 mg/dL (ref 65–99)
GLUCOSE-CAPILLARY: 109 mg/dL — AB (ref 65–99)
GLUCOSE-CAPILLARY: 114 mg/dL — AB (ref 65–99)
Glucose-Capillary: 95 mg/dL (ref 65–99)

## 2016-10-09 MED ORDER — CHOLESTYRAMINE LIGHT 4 G PO PACK
4.0000 g | PACK | Freq: Two times a day (BID) | ORAL | Status: DC
  Administered 2016-10-09 – 2016-10-18 (×15): 4 g via ORAL
  Filled 2016-10-09 (×18): qty 1

## 2016-10-09 MED ORDER — MESALAMINE ER 250 MG PO CPCR
500.0000 mg | ORAL_CAPSULE | Freq: Three times a day (TID) | ORAL | Status: DC
  Administered 2016-10-09 – 2016-10-18 (×19): 500 mg via ORAL
  Filled 2016-10-09 (×29): qty 2

## 2016-10-09 NOTE — Progress Notes (Signed)
[facility]  ROUNDING NOTE   Subjective:   More oriented and alert. Working with SLP this morning.  UOP 550 K 3.2  Creatinine 4.71 (6.44)  Objective:  Vital signs in last 24 hours:  Temp:  [98.7 F (37.1 C)-98.9 F (37.2 C)] 98.9 F (37.2 C) (12/10 0800) Pulse Rate:  [88-99] 93 (12/10 0800) Resp:  [16-18] 16 (12/10 0800) BP: (92-102)/(44-47) 100/47 (12/10 0800) SpO2:  [90 %-98 %] 98 % (12/10 0800) Weight:  [123.6 kg (272 lb 8 oz)] 123.6 kg (272 lb 8 oz) (12/10 0445)  Weight change: -3.629 kg (-8 lb) Filed Weights   10/07/16 1435 10/08/16 0500 10/09/16 0445  Weight: 127.2 kg (280 lb 8 oz) 122.7 kg (270 lb 9.6 oz) 123.6 kg (272 lb 8 oz)    Intake/Output: I/O last 3 completed shifts: In: 230 [P.O.:230] Out: 1350 [Urine:900; Stool:450]   Intake/Output this shift:  Total I/O In: 120 [P.O.:120] Out: 300 [Stool:300]  Physical Exam: General: No acute distress  Head: 2cm laceration occiput. OM moist  Eyes: Anicteric  Neck: Supple, trachea midline  Lungs:  clear, normal effort   Heart: S1S2 No rubs   Abdomen:  Umbilical hernia +, BS present   Extremities: Trace peripheral edema.  Neurologic: sedated  Skin: No lesions  Access: L IJ temporary dialysis catheter [id number]    Basic Metabolic Panel:  Recent Labs Lab 10/04/16 0020 10/04/16 0423 10/04/16 0807 10/05/16 0427 10/06/16 0457 10/07/16 0434 10/08/16 0545 10/09/16 0513  NA 141 141 142 141 147* 149* 142 136  K 3.8 3.9 4.0 3.2* 3.3* 3.1* 3.3* 3.2*  CL 107 107 110 108 115* 116* 105 102  CO2 28 29 27 26 25 24 30 28   GLUCOSE 107* 99 92 94 118* 107* 92 99  BUN 19 20 18  27* 30* 37* 21* 32*  CREATININE 1.24* 1.11* 1.16* 2.17* 3.28* 4.19* 3.44* 4.71*  CALCIUM 8.4* 8.4* 8.5* 8.0* 8.1* 8.0* 7.6* 7.5*  MG 1.8 1.7 1.7 1.7 1.8  --   --   --   PHOS 1.9* 2.2* 1.9* 3.2 4.2 4.3  --  4.5    Liver Function Tests:  Recent Labs Lab 10/04/16 0807 10/05/16 0427 10/06/16 0457 10/07/16 0434 10/09/16 0513   ALBUMIN 2.4* 2.3* 2.5* 2.3* 1.9*   No results for input(s): LIPASE, AMYLASE in the last 168 hours. No results for input(s): AMMONIA in the last 168 hours.  CBC:  Recent Labs Lab 10/04/16 0423 10/05/16 0427 10/06/16 0457 10/07/16 0434 10/09/16 0513  WBC 24.8* 19.1* 24.2* 17.2* 13.9*  NEUTROABS  --   --   --  11.0*  --   HGB 8.8* 7.9* 8.9* 7.8* 7.1*  HCT 26.5* 23.6* 27.2* 23.6* 21.3*  MCV 93.4 91.9 92.9 93.4 93.4  PLT 146* 150 235 239 313    Cardiac Enzymes: No results for input(s): CKTOTAL, CKMB, CKMBINDEX, TROPONINI in the last 168 hours.  BNP: Invalid input(s): POCBNP  CBG:  Recent Labs Lab 10/08/16 1644 10/08/16 2107 10/08/16 2334 10/09/16 0449 10/09/16 0753  GLUCAP 118* 86 88 107* 96    Microbiology: Results for orders placed or performed during the hospital encounter of 09/27/16  Blood Culture (routine x 2)     Status: None   Collection Time: 09/27/16  6:02 AM  Result Value Ref Range Status   Specimen Description BLOOD  R AC  Final   Special Requests   Final    BOTTLES DRAWN AEROBIC AND ANAEROBIC  AER 3 ML ANA 4 ML  Culture NO GROWTH 5 DAYS  Final   Report Status 10/02/2016 FINAL  Final  Blood Culture (routine x 2)     Status: None   Collection Time: 09/27/16  6:03 AM  Result Value Ref Range Status   Specimen Description BLOOD  R ARM  Final   Special Requests   Final    BOTTLES DRAWN AEROBIC AND ANAEROBIC  AER 6 ML ANA 10 ML   Culture NO GROWTH 5 DAYS  Final   Report Status 10/02/2016 FINAL  Final  Urine culture     Status: None   Collection Time: 09/27/16  9:30 AM  Result Value Ref Range Status   Specimen Description URINE, RANDOM  Final   Special Requests NONE  Final   Culture NO GROWTH Performed at [facility]   Final   Report Status 09/28/2016 FINAL  Final  MRSA PCR Screening     Status: None   Collection Time: 09/27/16 11:09 AM  Result Value Ref Range Status   MRSA by PCR NEGATIVE NEGATIVE Final    Comment:        The  GeneXpert MRSA Assay (FDA approved for NASAL specimens only), is one component of a comprehensive MRSA colonization surveillance program. It is not intended to diagnose MRSA infection nor to guide or monitor treatment for MRSA infections.   C difficile quick scan w PCR reflex     Status: None   Collection Time: 10/04/16 12:20 AM  Result Value Ref Range Status   C Diff antigen NEGATIVE NEGATIVE Final   C Diff toxin NEGATIVE NEGATIVE Final   C Diff interpretation No C. difficile detected.  Final  Gastrointestinal Panel by PCR , Stool     Status: None   Collection Time: 10/08/16  7:00 PM  Result Value Ref Range Status   Campylobacter species NOT DETECTED NOT DETECTED Final   Plesimonas shigelloides NOT DETECTED NOT DETECTED Final   Salmonella species NOT DETECTED NOT DETECTED Final   Yersinia enterocolitica NOT DETECTED NOT DETECTED Final   Vibrio species NOT DETECTED NOT DETECTED Final   Vibrio cholerae NOT DETECTED NOT DETECTED Final   Enteroaggregative E coli (EAEC) NOT DETECTED NOT DETECTED Final   Enteropathogenic E coli (EPEC) NOT DETECTED NOT DETECTED Final   Enterotoxigenic E coli (ETEC) NOT DETECTED NOT DETECTED Final   Shiga like toxin producing E coli (STEC) NOT DETECTED NOT DETECTED Final   Shigella/Enteroinvasive E coli (EIEC) NOT DETECTED NOT DETECTED Final   Cryptosporidium NOT DETECTED NOT DETECTED Final   Cyclospora cayetanensis NOT DETECTED NOT DETECTED Final   Entamoeba histolytica NOT DETECTED NOT DETECTED Final   Giardia lamblia NOT DETECTED NOT DETECTED Final   Adenovirus F40/41 NOT DETECTED NOT DETECTED Final   Astrovirus NOT DETECTED NOT DETECTED Final   Norovirus GI/GII NOT DETECTED NOT DETECTED Final   Rotavirus A NOT DETECTED NOT DETECTED Final   Sapovirus (I, II, IV, and V) NOT DETECTED NOT DETECTED Final    Coagulation Studies: No results for input(s): LABPROT, INR in the last 72 hours.  Urinalysis: No results for input(s): COLORURINE, LABSPEC,  PHURINE, GLUCOSEU, HGBUR, BILIRUBINUR, KETONESUR, PROTEINUR, UROBILINOGEN, NITRITE, LEUKOCYTESUR in the last 72 hours.  Invalid input(s): APPERANCEUR    Imaging: No results found.   Medications:    . bacitracin   Topical BID  . chlorhexidine  15 mL Mouth Rinse BID  . citalopram  40 mg Oral Daily  . cloZAPine  200 mg Oral Daily  . epoetin (EPOGEN/PROCRIT) injection  10,000  Units Intravenous Once  . famotidine  20 mg Oral Daily  . feeding supplement (ENSURE ENLIVE)  237 mL Oral BID BM  . feeding supplement (NEPRO CARB STEADY)  237 mL Oral TID BM  . haloperidol lactate  5 mg Intravenous Once  . heparin  5,000 Units Subcutaneous Q8H  . hydrocortisone sod succinate (SOLU-CORTEF) inj  25 mg Intravenous Daily  . LORazepam  2 mg Intravenous Q6H  . mouth rinse  15 mL Mouth Rinse q12n4p  . OLANZapine zydis  10 mg Oral BID WC  . sodium chloride flush  10-40 mL Intracatheter Q12H  . sodium chloride flush  3 mL Intravenous Q12H   sodium chloride, sodium chloride, acetaminophen, LORazepam, ondansetron (ZOFRAN) IV  Assessment/ Plan:  45 y.o.white female with a PMHx of Crohn's disease, depression, diabetes mellitus type 2, GERD, hiatal hernia, hyperlipidemia, hypertension, hypothyroidism, obesity, schizophrenia, traumatic brain injury, vitamin D deficiency, who was admitted to [facility] on 09/27/2016   1.  Acute renal failure with hyperkalemia and metabolic acidosis: CRRT from 11/29 to 12/5. Hemodialysis treatment on 12/8 Acute renal failure sepsis, hypotension, and hypoxemia No baseline creatinine for 3 years.  Nonoliguric urine output.  If further hemodialysis treatments required, plan on permcath placement.   2.  Hypernatremia: improved with dialysis treatment  3. Sepsis: cultures negative - off vasopressin and other pressors - solucortef -- completed antibiotics.    LOS: 12 Steph [place] 12/10/201711:30 AM

## 2016-10-09 NOTE — Progress Notes (Signed)
Speech Language Pathology Dysphagia Treatment Patient Details Name: [name] MRN: [id number] DOB: [birth year] Today's Date: 10/09/2016 Time: 0940-1000 SLP Time Calculation (min) (ACUTE ONLY): 20 min  Assessment / Plan / Recommendation Clinical Impression   pt presents with mild oral pharyngeal dysphagia characterized by delayed throat clear and cough with thin liquids.  Pt impulsive ingestion and decreased use of cyclic ingestion increase her risk of aspiration. RN administered regular with thin liquids prior to slp session as stated that pt was doing much better this date. Pt had delayed cough with thin liquids during slp session x 3 and delayed cough with thin liquids x 1 with slp direct observation of PO's . SLP educated nursing and pt of upgrade to ndds 2 with nectar, however no thin or further upgraded trials at this time.     Diet Recommendation    NDDS 2 with nectar thick liquids   SLP Plan Continue with current plan of care   Pertinent Vitals/Pain No pain reported   Swallowing Goals      Pt to intake the least restrictive diet considered safe.    General Behavior/Cognition: Alert;Cooperative;Pleasant mood Patient Positioning: Upright in bed Oral care provided: N/A  Oral Cavity - Oral Hygiene     Dysphagia Treatment Family/Caregiver Educated: no family present this session Treatment Methods: Skilled observation;Upgraded PO texture trial;Patient/caregiver education Patient observed directly with PO's: Yes Type of PO's observed: Dysphagia 1 (puree);Dysphagia 2 (chopped);Dysphagia 3 (soft);Thin liquids;Nectar-thick liquids Feeding: Able to feed self with adaptive devices;Able to feed self Liquids provided via: Teaspoon;Cup;Straw Pharyngeal Phase Signs & Symptoms: Delayed throat clear;Delayed cough Type of cueing: Verbal;Tactile;Visual Amount of cueing: Minimal   GO     Tonita Phoenix, MA/CCC-SLP 10/09/2016, 11:02 AM

## 2016-10-09 NOTE — NC FL2 (Signed)
[place] LEVEL OF CARE SCREENING TOOL     IDENTIFICATION  Patient Name: [name] Birthdate: April 20, 1971 Sex: female Admission Date (Current Location): 09/27/2016  [facility] and [place] Number:  Engineering geologist and Address:  [facility], [address], [place],  [address]      Provider Number: [id number]  Attending Physician Name and Address:  Theodoro Grist, MD  Relative Name and Phone Number:       Current Level of Care: Hospital Recommended Level of Care: [place] Prior Approval Number:    Date Approved/Denied:   PASRR Number:    Discharge Plan: SNF    Current Diagnoses: Patient Active Problem List   Diagnosis Date Noted  . Scalp laceration   . Pressure injury of skin 10/02/2016  . Schizophrenia ([place]) 09/30/2016  . Acute renal failure (ARF) ([place])   . Respiratory failure ([place]) 09/27/2016  . Other sign and symptom in breast     Orientation RESPIRATION BLADDER Height & Weight     Self, Time, Situation, Place  Normal Incontinent Weight: 272 lb 8 oz (123.6 kg) Height:  5\' 6"  (167.6 cm)  BEHAVIORAL SYMPTOMS/MOOD NEUROLOGICAL BOWEL NUTRITION STATUS      Incontinent Diet (NDDS 2 with nectar thick liquids)  AMBULATORY STATUS COMMUNICATION OF NEEDS Skin   Total Care Verbally Normal                       Personal Care Assistance Level of Assistance  Bathing, Dressing Bathing Assistance: Limited assistance   Dressing Assistance: Limited assistance     Functional Limitations Info             SPECIAL CARE FACTORS FREQUENCY  PT (By licensed PT), OT (By licensed OT)     PT Frequency: Up to 5X per day OT Frequency: Up to 5X per day            Contractures Contractures Info: Present    Additional Factors Info  Allergies, Psychotropic   Allergies Info: Peanut Oil, Risperidone And Related Psychotropic Info: Celexa, Risperadone (her allergy only causes a cough), Clozaril,  Ativan         Current Medications (10/09/2016):  This is the current hospital active medication list Current Facility-Administered Medications  Medication Dose Route Frequency Provider Last Rate Last Dose  . 0.9 %  sodium chloride infusion  250 mL Intravenous PRN Flora Lipps, MD      . 0.9 %  sodium chloride infusion  250 mL Intravenous PRN Flora Lipps, MD 20 mL/hr at 10/06/16 1600 250 mL at 10/06/16 1600  . acetaminophen (TYLENOL) tablet 650 mg  650 mg Oral Q4H PRN Flora Lipps, MD   650 mg at 10/08/16 1044  . bacitracin ointment   Topical BID Jules Husbands, MD      . chlorhexidine (PERIDEX) 0.12 % solution 15 mL  15 mL Mouth Rinse BID Flora Lipps, MD   15 mL at 10/09/16 0936  . cholestyramine light (PREVALITE) packet 4 g  4 g Oral BID Theodoro Grist, MD   4 g at 10/09/16 1522  . citalopram (CELEXA) tablet 40 mg  40 mg Oral Daily Gonzella Lex, MD   40 mg at 10/09/16 [id number]  . cloZAPine (CLOZARIL) tablet 200 mg  200 mg Oral Daily Gonzella Lex, MD   200 mg at 10/09/16 0935  . epoetin alfa (EPOGEN,PROCRIT) injection 10,000 Units  10,000 Units Intravenous Once Lavonia Dana, MD      .  famotidine (PEPCID) tablet 20 mg  20 mg Oral Daily Lenis Noon, RPH   20 mg at 10/09/16 0935  . feeding supplement (ENSURE ENLIVE) (ENSURE ENLIVE) liquid 237 mL  237 mL Oral BID BM Demetrios Loll, MD   237 mL at 10/08/16 1120  . feeding supplement (NEPRO CARB STEADY) liquid 237 mL  237 mL Oral TID BM Theodoro Grist, MD   237 mL at 10/09/16 1306  . haloperidol lactate (HALDOL) injection 5 mg  5 mg Intravenous Once Harrie Foreman, MD      . heparin injection 5,000 Units  5,000 Units Subcutaneous Q8H Flora Lipps, MD   5,000 Units at 10/09/16 1500  . hydrocortisone sodium succinate (SOLU-CORTEF) 100 MG injection 25 mg  25 mg Intravenous Daily Laverle Hobby, MD   25 mg at 10/09/16 0933  . LORazepam (ATIVAN) tablet 2 mg  2 mg Oral Q6H PRN Laverle Hobby, MD   2 mg at 10/07/16 1823  . MEDLINE mouth rinse  15  mL Mouth Rinse q12n4p Flora Lipps, MD   15 mL at 10/08/16 1703  . mesalamine (PENTASA) CR capsule 500 mg  500 mg Oral TID Theodoro Grist, MD      . OLANZapine zydis (ZYPREXA) disintegrating tablet 10 mg  10 mg Oral BID WC Gonzella Lex, MD   10 mg at 10/09/16 0900  . ondansetron (ZOFRAN) injection 4 mg  4 mg Intravenous Q6H PRN Flora Lipps, MD      . sodium chloride flush (NS) 0.9 % injection 10-40 mL  10-40 mL Intracatheter Q12H Flora Lipps, MD   10 mL at 10/08/16 2200  . sodium chloride flush (NS) 0.9 % injection 3 mL  3 mL Intravenous Q12H Flora Lipps, MD   3 mL at 10/08/16 2200     Discharge Medications: Please see discharge summary for a list of discharge medications.  Relevant Imaging Results:  Relevant Lab Results:   Additional Information SS# [id number]  Zettie Pho, LCSW

## 2016-10-09 NOTE — Clinical Social Work Note (Signed)
Clinical Social Work Assessment  Patient Details  Name: [name] MRN: [id number] Date of Birth: [birth year]  Date of referral:  09/28/16               Reason for consult:  Facility Placement                Permission sought to share information with:   (unable due to being on vent) Permission granted to share information::     Name::        Agency::     Relationship::     Contact Information:     Housing/Transportation Living arrangements for the past 2 months:  Group Home Source of Information:  Facility Patient Interpreter Needed:  None Criminal Activity/Legal Involvement Pertinent to Current Situation/Hospitalization:  No - Comment as needed Significant Relationships:  Other(Comment) (aunt) Lives with:  Facility Resident Do you feel safe going back to the place where you live?   (patient unable to answer as she is on vent) Need for family participation in patient care:  Yes (Comment)  Care giving concerns: New SNF placement   Social Worker assessment / plan:  CSW attempted to visit patient at bedside. The patient was sleeping and was not able to be roused. The CSW attempted to contact the patient's aunt with no answer at either number and no ability to leave voicemail. The CSW called the patient's group home owner, Janetta Hora, who was able to discuss the patient's care. Ms. Primus Bravo gave verbal permission to conduct a CSW referral for SNF and named [facility] and [facility] as the top two choices. Due to the patient's psychiatric history, her PASRR has passed to a Level II and will require assessment from the state prior to dc to any facility.  Employment status:  Disabled (Comment on whether or not currently receiving Disability) Insurance information:  Medicare PT Recommendations:    Information / Referral to community resources:     Patient/Family's Response to care:  Patient was sleeping.  Patient/Family's Understanding of and Emotional Response to Diagnosis, Current  Treatment, and Prognosis:  Facility representatives were cooperative and understanding of the treatment plan.  Emotional Assessment Appearance:  Appears stated age Attitude/Demeanor/Rapport:   (on vent currently) Affect (typically observed):   (on vent currently) Orientation:   (on vent currently; basline is alert and oriented X4) Alcohol / Substance use:  Not Applicable Psych involvement (Current and /or in the community):  No (Comment)  Discharge Needs  Concerns to be addressed:  Care Coordination Readmission within the last 30 days:  No Current discharge risk:  None Barriers to Discharge:  No Barriers Identified   Zettie Pho, LCSW 10/09/2016, 3:03 PM

## 2016-10-09 NOTE — Progress Notes (Signed)
[place] [facility] [place] [facility] [place] NAME: [name]    MR#:  [id number]  DATE OF BIRTH:  Oct 15, 1971  SUBJECTIVE:  CHIEF COMPLAINT:  No chief complaint on file.  The patient has no complaints, she is confused. Loose stools, rectal tube is placed, C. difficile was tested and negative. Gastrointestinal panel is negative as well. REVIEW OF SYSTEMS:  Review of Systems  Unable to perform ROS: Acuity of condition    DRUG ALLERGIES:   Allergies  Allergen Reactions  . Peanut Oil Other (See Comments)    Face turns red  . Risperidone And Related Cough   VITALS:  Blood pressure (!) 100/47, pulse 93, temperature 98.9 F (37.2 C), temperature source Axillary, resp. rate 16, height 5\' 6"  (1.676 m), weight 123.6 kg (272 lb 8 oz), SpO2 98 %. PHYSICAL EXAMINATION:  Physical Exam  Constitutional: She is well-developed, well-nourished, and in no distress.  Morbid obesity.  HENT:  Head: Normocephalic.  Mouth/Throat: Oropharynx is clear and moist.  Eyes: Conjunctivae and EOM are normal.  Neck: Normal range of motion. Neck supple. No JVD present. No tracheal deviation present.  Cardiovascular: Normal rate, regular rhythm and normal heart sounds.   Pulmonary/Chest: Effort normal and breath sounds normal. No respiratory distress. She has no wheezes. She has no rales.  Abdominal: Soft. Bowel sounds are normal. She exhibits no distension. There is no tenderness.  Musculoskeletal: She exhibits no edema or tenderness.  Neurological: She is alert. No cranial nerve deficit.  Confused but follow commands.  Skin: No rash noted. No erythema.   LABORATORY PANEL:   CBC  Recent Labs Lab 10/09/16 0513  WBC 13.9*  HGB 7.1*  HCT 21.3*  PLT 313   ------------------------------------------------------------------------------------------------------------------ Chemistries   Recent Labs Lab 10/06/16 0457  10/09/16 0513  NA 147*  < > 136  K 3.3*  < > 3.2*  CL  115*  < > 102  CO2 25  < > 28  GLUCOSE 118*  < > 99  BUN 30*  < > 32*  CREATININE 3.28*  < > 4.71*  CALCIUM 8.1*  < > 7.5*  MG 1.8  --   --   < > = values in this interval not displayed. RADIOLOGY:  No results found. ASSESSMENT AND PLAN:   45 yo obese white female admitted to the ICU for septic shock?UTI with severe metabolic acidosis, acute encephalopathy and acute respiratory failure;  Status post extubation. ARF on CRRT. All cultures are negative so far.  Acute respiratory failure with hypoxia--improved. Extubated 12/1 off nasal cannula, now on room air with O2 sats of 98%  Severe septic shock, blood pressure improved but still in low side, Off vasopressor. She has been treated with Zosyn for 10 days, now off antibiotics. ECHO with normal LVEF of approximately 60% Continue Solu-Cortef.  AKI possibly related to severe sepsis s/p  CRRT, also started on albumin by nephrology. PermCath is planned, if further needed dialysis, patient's urinary output was 550 cc over the past 24 hours. Discussed with nephrologist, Dr. Juleen China  Hyperkalemia-resolved Hypocalcemia, likely alimental Hypophosphatemia, resolved   Leukocytosis, likely due to steroid, improving  Acute encephalopathy, delirium related to severe sepsis, improving clinically. Severe agitation with underlying schizophrenia. -Psychiatry following.  Diarrhea, status post rectal tube placement, C. difficile negative, gastrointestinal panel is negative, restart patient on mesalamine, initiated her on cholestyramine  All the records are reviewed and case discussed with Care Management/Social Worker. Management plans discussed with the patient's aunt,  and they are in agreement.  CODE STATUS: Full code  TOTAL TIME TAKING CARE OF THIS PATIENT: 40 minutes.   More than 50% of the time was spent in counseling/coordination of care: YES  POSSIBLE D/C IN >3 DAYS, DEPENDING ON CLINICAL CONDITION.   Theodoro Grist M.D on 10/09/2016  at 2:24 PM  Between 7am to 6pm - Pager - [phone]  After 6pm go to [email or web address] - Proofreader  [facility]  Office  [phone]  CC: Primary care physician; Lorelee Market, MD  Note: This dictation was prepared with Dragon dictation along with smaller phrase technology. Any transcriptional errors that result from this process are unintentional.

## 2016-10-10 LAB — RENAL FUNCTION PANEL
ANION GAP: 8 (ref 5–15)
Albumin: 1.9 g/dL — ABNORMAL LOW (ref 3.5–5.0)
BUN: 42 mg/dL — ABNORMAL HIGH (ref 6–20)
CALCIUM: 7.6 mg/dL — AB (ref 8.9–10.3)
CHLORIDE: 103 mmol/L (ref 101–111)
CO2: 26 mmol/L (ref 22–32)
Creatinine, Ser: 5.6 mg/dL — ABNORMAL HIGH (ref 0.44–1.00)
GFR calc non Af Amer: 8 mL/min — ABNORMAL LOW (ref >60)
GFR, EST AFRICAN AMERICAN: 10 mL/min — AB (ref >60)
Glucose, Bld: 93 mg/dL (ref 65–99)
POTASSIUM: 3.2 mmol/L — AB (ref 3.5–5.1)
Phosphorus: 4.4 mg/dL (ref 2.5–4.6)
SODIUM: 137 mmol/L (ref 135–145)

## 2016-10-10 LAB — CBC
HEMATOCRIT: 20.8 % — AB (ref 35.0–47.0)
HEMOGLOBIN: 6.8 g/dL — AB (ref 12.0–16.0)
MCH: 30.8 pg (ref 26.0–34.0)
MCHC: 33 g/dL (ref 32.0–36.0)
MCV: 93.5 fL (ref 80.0–100.0)
Platelets: 349 10*3/uL (ref 150–440)
RBC: 2.22 MIL/uL — AB (ref 3.80–5.20)
RDW: 14.3 % (ref 11.5–14.5)
WBC: 13.1 10*3/uL — ABNORMAL HIGH (ref 3.6–11.0)

## 2016-10-10 LAB — GLUCOSE, CAPILLARY
GLUCOSE-CAPILLARY: 119 mg/dL — AB (ref 65–99)
GLUCOSE-CAPILLARY: 91 mg/dL (ref 65–99)
GLUCOSE-CAPILLARY: 97 mg/dL (ref 65–99)
Glucose-Capillary: 99 mg/dL (ref 65–99)
Glucose-Capillary: 94 mg/dL (ref 65–99)
Glucose-Capillary: 100 mg/dL — ABNORMAL HIGH (ref 65–99)

## 2016-10-10 NOTE — Progress Notes (Signed)
[facility] [place] NAME: [name]    MR#:  [id number]  DATE OF BIRTH:  07/26/1971  SUBJECTIVE:  CHIEF COMPLAINT:  No chief complaint on file.  The patient has no complaints, she is confusedAnd talking nonsense today. Loose stools, rectal tube is placed, C. difficile was tested and negative. Gastrointestinal panel is negative as well. Initiated on cholestyramine and mesalamine yesterday, still is thicker consistency today.  REVIEW OF SYSTEMS:  Review of Systems  Unable to perform ROS: Mental status change  Patient is confused, although knows that she is in the hospital  DRUG ALLERGIES:   Allergies  Allergen Reactions  . Peanut Oil Other (See Comments)    Face turns red  . Risperidone And Related Cough   VITALS:  Blood pressure (!) 91/42, pulse (!) 102, temperature 99.3 F (37.4 C), temperature source Oral, resp. rate 12, height 5\' 6"  (1.676 m), weight 123.6 kg (272 lb 6.4 oz), SpO2 93 %. PHYSICAL EXAMINATION:  Physical Exam  Constitutional: She is well-developed, well-nourished, and in no distress.  Morbid obesity.  HENT:  Head: Normocephalic.  Mouth/Throat: Oropharynx is clear and moist.  Eyes: Conjunctivae and EOM are normal.  Neck: Normal range of motion. Neck supple. No JVD present. No tracheal deviation present.  Cardiovascular: Normal rate, regular rhythm and normal heart sounds.   Pulmonary/Chest: Effort normal and breath sounds normal. No respiratory distress. She has no wheezes. She has no rales.  Abdominal: Soft. Bowel sounds are normal. She exhibits no distension. There is no tenderness.  Musculoskeletal: She exhibits no edema or tenderness.  Neurological: She is alert. No cranial nerve deficit.  Confused but follow commands.  Skin: No rash noted. No erythema.   LABORATORY PANEL:   CBC  Recent Labs Lab 10/10/16 0625  WBC 13.1*  HGB 6.8*  HCT 20.8*  PLT 349    ------------------------------------------------------------------------------------------------------------------ Chemistries   Recent Labs Lab 10/06/16 0457  10/10/16 0625  NA 147*  < > 137  K 3.3*  < > 3.2*  CL 115*  < > 103  CO2 25  < > 26  GLUCOSE 118*  < > 93  BUN 30*  < > 42*  CREATININE 3.28*  < > 5.60*  CALCIUM 8.1*  < > 7.6*  MG 1.8  --   --   < > = values in this interval not displayed. RADIOLOGY:  No results found. ASSESSMENT AND PLAN:   45 yo obese white female admitted to the ICU for septic shock?UTI with severe metabolic acidosis, acute encephalopathy and acute respiratory failure;  Status post extubation. ARF on CRRT. All cultures are negative so far.  Acute respiratory failure with hypoxia--improved. Extubated 12/1 off nasal cannula, now on room air with O2 sats of 98%  Severe septic Versus hypovolemic shock, blood pressure improved but still in low side, Off vasopressor. She has been treated with Zosyn for 10 days, now off antibiotics. ECHO with normal LVEF of approximately 60% Continue Solu-Cortef.The patient continues to have diarrhea, may be fluid depleted. If does not keep up with oral intake, although oral intake has improved and now she eats 100% of offered meals.   AKI possibly related to severe sepsis s/p  CRRT, also started on albumin by nephrology. PermCath is planned, if further needed dialysis, patient's urinary output was 1125cc over the past 24 hours. Discussed with nephrologist, Dr. Candiss Norse him a creatinine as high at 5.6 today, patient continues to lose lots of fluids with  diarrheal stool. Follow closely and initiate IV fluids if needed  Hyperkalemia-resolved Hypocalcemia, likely alimental Hypophosphatemia, resolved   Leukocytosis, likely due to steroid, improving  Acute encephalopathy, delirium related to severe sepsis, improving clinically. Severe agitation with underlying schizophrenia. -Psychiatry following.  Diarrhea, status post  rectal tube placement, C. difficile negative, gastrointestinal panel is negative, restarted on mesalamine, advance cholestyramine, get gastroenterologist involved and recommendations  All the records are reviewed and case discussed with Care Management/Social Worker. Management plans discussed with the patient's aunt, and they are in agreement.  CODE STATUS: Full code  TOTAL TIME TAKING CARE OF THIS PATIENT: 40 minutes.   More than 50% of the time was spent in counseling/coordination of care: YES  POSSIBLE D/C IN >3 DAYS, DEPENDING ON CLINICAL CONDITION.   Theodoro Grist M.D on 10/10/2016 at 3:32 PM  Between 7am to 6pm - Pager - [phone]  After 6pm go to [email or web address] - Proofreader  [facility]  Office  [phone]  CC: Primary care physician; Lorelee Market, MD  Note: This dictation was prepared with Dragon dictation along with smaller phrase technology. Any transcriptional errors that result from this process are unintentional.

## 2016-10-10 NOTE — Clinical Social Work Note (Signed)
MSW received request from [facility] that patient is a level 2 manual screen.  [facility] requesting clinicals to be faxed to [facility].  MSW to fax clinicals and continue to follow patient's progress throughout discharge planning.  [name]. September Mormile, MSW [phone]  Mon-Fri 8a-4:30p 10/10/2016 5:22 PM

## 2016-10-10 NOTE — Care Management CHF Note (Signed)
Results for KIRIAKI, GRESHAM (MRN [id number]) as of 10/10/2016 12:09  Ref. Range 10/07/2016 14:47  Hepatitis B Surface Ag Latest Ref Range: Negative  Negative  Hep B S Ab Unknown Non Reactive  Hep B Core Ab, Tot Latest Ref Range: Negative  Negative  Hepatitis B-Post Latest Ref Range: Immunity>9.9 mIU/mL <3.1 (L)

## 2016-10-10 NOTE — Progress Notes (Addendum)
   10/10/16 1200  PPD Results  Does patient have an induration at the injection site? No (verified with pharmacy ([facility]))

## 2016-10-10 NOTE — Consult Note (Signed)
[place] [facility] Consult   Reason for Consult:  Follow-up note for this 45 year old woman with schizophrenia currently still in the intensive care unit recovering from sepsis Referring Physician:  Mortimer Fries Patient Identification: [name] MRN:  [id number] Principal Diagnosis: Schizophrenia [facility]) Diagnosis:   Patient Active Problem List   Diagnosis Date Noted  . Scalp laceration [S01.01XA]   . Pressure injury of skin [L89.90] 10/02/2016  . Schizophrenia ([place]) [F20.9] 09/30/2016  . Acute renal failure (ARF) ([place]) [N17.9]   . Respiratory failure ([place]) [J96.90] 09/27/2016  . Other sign and symptom in breast [N64.59]     Total Time spent with patient: 20 minutes  Subjective:   [name] is a 45 y.o. female patient admitted with "I guess I'm okay".  HPI:   Follow-up note for this patient was schizophrenia recovering from sepsis. Spoke with the patient. Chart reviewed. Spoke with nursing. Mentally the patient seems to of calm down. She was able to answer simple questions fairly rationally. Has not been agitated or aggressive. Still disorganized and bizarre sometimes in her speech but generally cooperative despite uncomfortable situation. Past Psychiatric History: Long-standing history of schizophrenia on clozapine and other medications.  Risk to Self:   Risk to Others:   Prior Inpatient Therapy:   Prior Outpatient Therapy:    Past Medical History:  Past Medical History:  Diagnosis Date  . Abnormal mammogram, unspecified 2013   Prev. cytology,hypercellular smears without evidence of malignant cells. The cytopathologist questioned if samples truly representative. Care taken during sampling and is felt to be representative.  . Breast screening, unspecified 2013  . Broken leg    age 42  . Cellulitis   . Crohn's disease ([place]) 2013  . Depression   . Diabetes mellitus without complication (HCC)    non insulin dependent  . Early menopause   . Edema   . GERD  (gastroesophageal reflux disease)   . Hiatal hernia 2013  . Hyperlipidemia   . Hypertension   . Hypothyroidism   . 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 Obesity, unspecified 2013  . Other sign and symptom in breast 2013   Right bst US,lower outer quadrant,A single 0.3x0.4x0.6cm hypoechoic mass with slightly lobulated borders with adjacent 0.3x0.4x0.5cm mass was noted. The 1st was 5cm from nipple, 2nd at 8 cm from the nipple. The previous lesion aspirate was at 3 0'clock position. These lesions are thought to account for the mammographic abnormality. Minimal interval change on [place].   [facility])   . Rib fracture    age 46  . Schizophrenia ([place])   . TBI (traumatic brain injury) ([place])   . Thyroid disease    hypothyroid  . Vitamin D deficiency     Past Surgical History:  Procedure Laterality Date  . COLONOSCOPY WITH PROPOFOL N/A 02/16/2016   Procedure: COLONOSCOPY WITH PROPOFOL;  Surgeon: Lollie Sails, MD;  Location: [facility] ENDOSCOPY;  Service: Endoscopy;  Laterality: N/A;  . COLONOSCOPY WITH PROPOFOL N/A 02/22/2016   Procedure: COLONOSCOPY WITH PROPOFOL;  Surgeon: Lollie Sails, MD;  Location: [facility] ENDOSCOPY;  Service: Endoscopy;  Laterality: N/A;  . EYE SURGERY Left 2013   cataract surgery  . 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 Cancer Mother 56    colon  . Cancer Paternal Aunt    Family Psychiatric  History:  None identified Social History:  History  Alcohol Use No     History  Drug Use No    Social History   Social History  . Marital status: Single    Spouse name: N/A  . Number of children: N/A  . Years of education: N/A   Social History Main Topics  . Smoking status: Never Smoker  . Smokeless tobacco: Never Used  . Alcohol use No  . Drug use: No  . Sexual activity: Not Asked   Other Topics Concern  . None   Social History  Narrative  . None   Additional Social History:    Allergies:   Allergies  Allergen Reactions  . Peanut Oil Other (See Comments)    Face turns red  . Risperidone And Related Cough    Labs:  Results for orders placed or performed during the hospital encounter of 09/27/16 (from the past 48 hour(s))  Gastrointestinal Panel by PCR , Stool     Status: None   Collection Time: 10/08/16  7:00 PM  Result Value Ref Range   Campylobacter species NOT DETECTED NOT DETECTED   Plesimonas shigelloides NOT DETECTED NOT DETECTED   Salmonella species NOT DETECTED NOT DETECTED   Yersinia enterocolitica NOT DETECTED NOT DETECTED   Vibrio species NOT DETECTED NOT DETECTED   Vibrio cholerae NOT DETECTED NOT DETECTED   Enteroaggregative E coli (EAEC) NOT DETECTED NOT DETECTED   Enteropathogenic E coli (EPEC) NOT DETECTED NOT DETECTED   Enterotoxigenic E coli (ETEC) NOT DETECTED NOT DETECTED   Shiga like toxin producing E coli (STEC) NOT DETECTED NOT DETECTED   Shigella/Enteroinvasive E coli (EIEC) NOT DETECTED NOT DETECTED   Cryptosporidium NOT DETECTED NOT DETECTED   Cyclospora cayetanensis NOT DETECTED NOT DETECTED   Entamoeba histolytica NOT DETECTED NOT DETECTED   Giardia lamblia NOT DETECTED NOT DETECTED   Adenovirus F40/41 NOT DETECTED NOT DETECTED   Astrovirus NOT DETECTED NOT DETECTED   Norovirus GI/GII NOT DETECTED NOT DETECTED   Rotavirus A NOT DETECTED NOT DETECTED   Sapovirus (I, II, IV, and V) NOT DETECTED NOT DETECTED  Glucose, capillary     Status: None   Collection Time: 10/08/16  9:07 PM  Result Value Ref Range   Glucose-Capillary 86 65 - 99 mg/dL   Comment 1 Notify RN    Comment 2 Document in Chart   Glucose, capillary     Status: None   Collection Time: 10/08/16 11:34 PM  Result Value Ref Range   Glucose-Capillary 88 65 - 99 mg/dL   Comment 1 Notify RN    Comment 2 Document in Chart   Glucose, capillary     Status: Abnormal   Collection Time: 10/09/16  4:49 AM  Result  Value Ref Range   Glucose-Capillary 107 (H) 65 - 99 mg/dL   Comment 1 Notify RN    Comment 2 Document in Chart   CBC     Status: Abnormal   Collection Time: 10/09/16  5:13 AM  Result Value Ref Range   WBC 13.9 (H) 3.6 - 11.0 K/uL   RBC 2.28 (L) 3.80 - 5.20 MIL/uL   Hemoglobin 7.1 (L) 12.0 - 16.0 g/dL   HCT 21.3 (L) 35.0 - 47.0 %   MCV 93.4 80.0 - 100.0 fL   MCH 31.0 26.0 - 34.0 pg   MCHC 33.1 32.0 - 36.0 g/dL   RDW 14.6 (H) 11.5 - 14.5 %   Platelets 313 150 - 440 K/uL  Renal function panel  Status: Abnormal   Collection Time: 10/09/16  5:13 AM  Result Value Ref Range   Sodium 136 135 - 145 mmol/L   Potassium 3.2 (L) 3.5 - 5.1 mmol/L   Chloride 102 101 - 111 mmol/L   CO2 28 22 - 32 mmol/L   Glucose, Bld 99 65 - 99 mg/dL   BUN 32 (H) 6 - 20 mg/dL   Creatinine, Ser 4.71 (H) 0.44 - 1.00 mg/dL   Calcium 7.5 (L) 8.9 - 10.3 mg/dL   Phosphorus 4.5 2.5 - 4.6 mg/dL   Albumin 1.9 (L) 3.5 - 5.0 g/dL   GFR calc non Af Amer 10 (L) >60 mL/min   GFR calc Af Amer 12 (L) >60 mL/min    Comment: (NOTE) The eGFR has been calculated using the CKD EPI equation. This calculation has not been validated in all clinical situations. eGFR's persistently <60 mL/min signify possible Chronic Kidney Disease.    Anion gap 6 5 - 15  Glucose, capillary     Status: None   Collection Time: 10/09/16  7:53 AM  Result Value Ref Range   Glucose-Capillary 96 65 - 99 mg/dL  Glucose, capillary     Status: Abnormal   Collection Time: 10/09/16 12:18 PM  Result Value Ref Range   Glucose-Capillary 109 (H) 65 - 99 mg/dL  Glucose, capillary     Status: Abnormal   Collection Time: 10/09/16  5:24 PM  Result Value Ref Range   Glucose-Capillary 114 (H) 65 - 99 mg/dL  Glucose, capillary     Status: None   Collection Time: 10/09/16  9:09 PM  Result Value Ref Range   Glucose-Capillary 95 65 - 99 mg/dL  Glucose, capillary     Status: None   Collection Time: 10/10/16 12:13 AM  Result Value Ref Range    Glucose-Capillary 91 65 - 99 mg/dL  Glucose, capillary     Status: None   Collection Time: 10/10/16  6:23 AM  Result Value Ref Range   Glucose-Capillary 99 65 - 99 mg/dL  Renal function panel     Status: Abnormal   Collection Time: 10/10/16  6:25 AM  Result Value Ref Range   Sodium 137 135 - 145 mmol/L   Potassium 3.2 (L) 3.5 - 5.1 mmol/L   Chloride 103 101 - 111 mmol/L   CO2 26 22 - 32 mmol/L   Glucose, Bld 93 65 - 99 mg/dL   BUN 42 (H) 6 - 20 mg/dL   Creatinine, Ser 5.60 (H) 0.44 - 1.00 mg/dL   Calcium 7.6 (L) 8.9 - 10.3 mg/dL   Phosphorus 4.4 2.5 - 4.6 mg/dL   Albumin 1.9 (L) 3.5 - 5.0 g/dL   GFR calc non Af Amer 8 (L) >60 mL/min   GFR calc Af Amer 10 (L) >60 mL/min    Comment: (NOTE) The eGFR has been calculated using the CKD EPI equation. This calculation has not been validated in all clinical situations. eGFR's persistently <60 mL/min signify possible Chronic Kidney Disease.    Anion gap 8 5 - 15  CBC     Status: Abnormal   Collection Time: 10/10/16  6:25 AM  Result Value Ref Range   WBC 13.1 (H) 3.6 - 11.0 K/uL   RBC 2.22 (L) 3.80 - 5.20 MIL/uL   Hemoglobin 6.8 (L) 12.0 - 16.0 g/dL   HCT 20.8 (L) 35.0 - 47.0 %   MCV 93.5 80.0 - 100.0 fL   MCH 30.8 26.0 - 34.0 pg   MCHC 33.0 32.0 -  36.0 g/dL   RDW 14.3 11.5 - 14.5 %   Platelets 349 150 - 440 K/uL  Glucose, capillary     Status: None   Collection Time: 10/10/16  7:40 AM  Result Value Ref Range   Glucose-Capillary 94 65 - 99 mg/dL  Glucose, capillary     Status: Abnormal   Collection Time: 10/10/16 11:17 AM  Result Value Ref Range   Glucose-Capillary 119 (H) 65 - 99 mg/dL  Glucose, capillary     Status: None   Collection Time: 10/10/16  4:59 PM  Result Value Ref Range   Glucose-Capillary 97 65 - 99 mg/dL    Current Facility-Administered Medications  Medication Dose Route Frequency Provider Last Rate Last Dose  . 0.9 %  sodium chloride infusion  250 mL Intravenous PRN Flora Lipps, MD      . 0.9 %  sodium  chloride infusion  250 mL Intravenous PRN Flora Lipps, MD 20 mL/hr at 10/06/16 1600 250 mL at 10/06/16 1600  . acetaminophen (TYLENOL) tablet 650 mg  650 mg Oral Q4H PRN Flora Lipps, MD   650 mg at 10/08/16 1044  . bacitracin ointment   Topical BID Jules Husbands, MD      . chlorhexidine (PERIDEX) 0.12 % solution 15 mL  15 mL Mouth Rinse BID Flora Lipps, MD   15 mL at 10/10/16 0928  . cholestyramine light (PREVALITE) packet 4 g  4 g Oral BID Theodoro Grist, MD   4 g at 10/10/16 0928  . citalopram (CELEXA) tablet 40 mg  40 mg Oral Daily Gonzella Lex, MD   40 mg at 10/10/16 0927  . cloZAPine (CLOZARIL) tablet 200 mg  200 mg Oral Daily Gonzella Lex, MD   200 mg at 10/10/16 0931  . epoetin alfa (EPOGEN,PROCRIT) injection 10,000 Units  10,000 Units Intravenous Once Lavonia Dana, MD      . famotidine (PEPCID) tablet 20 mg  20 mg Oral Daily Lenis Noon, RPH   20 mg at 10/10/16 6734  . feeding supplement (ENSURE ENLIVE) (ENSURE ENLIVE) liquid 237 mL  237 mL Oral BID BM Demetrios Loll, MD   237 mL at 10/10/16 1337  . feeding supplement (NEPRO CARB STEADY) liquid 237 mL  237 mL Oral TID BM Theodoro Grist, MD   237 mL at 10/10/16 1343  . haloperidol lactate (HALDOL) injection 5 mg  5 mg Intravenous Once Harrie Foreman, MD      . heparin injection 5,000 Units  5,000 Units Subcutaneous Q8H Flora Lipps, MD   5,000 Units at 10/10/16 1336  . hydrocortisone sodium succinate (SOLU-CORTEF) 100 MG injection 25 mg  25 mg Intravenous Daily Laverle Hobby, MD   25 mg at 10/10/16 0932  . LORazepam (ATIVAN) tablet 2 mg  2 mg Oral Q6H PRN Laverle Hobby, MD   2 mg at 10/09/16 2317  . MEDLINE mouth rinse  15 mL Mouth Rinse q12n4p Flora Lipps, MD   15 mL at 10/10/16 1724  . mesalamine (PENTASA) CR capsule 500 mg  500 mg Oral TID Theodoro Grist, MD   500 mg at 10/10/16 1723  . OLANZapine zydis (ZYPREXA) disintegrating tablet 10 mg  10 mg Oral BID WC Gonzella Lex, MD   10 mg at 10/10/16 1200  . ondansetron  (ZOFRAN) injection 4 mg  4 mg Intravenous Q6H PRN Flora Lipps, MD      . sodium chloride flush (NS) 0.9 % injection 10-40 mL  10-40 mL Intracatheter Q12H Flora Lipps,  MD   10 mL at 10/10/16 0933  . sodium chloride flush (NS) 0.9 % injection 3 mL  3 mL Intravenous Q12H Flora Lipps, MD   3 mL at 10/10/16 0933    Musculoskeletal: Strength & Muscle Tone: decreased Gait & Station: unable to stand Patient leans: N/A  Psychiatric Specialty Exam: Physical Exam  Nursing note and vitals reviewed. Constitutional: She appears well-developed and well-nourished. No distress.  HENT:  Head: Normocephalic and atraumatic.  Eyes: Conjunctivae are normal. Pupils are equal, round, and reactive to light.  Neck: Normal range of motion.  Cardiovascular: Regular rhythm and normal heart sounds.   Respiratory: Effort normal. No respiratory distress.  GI: Soft.  Musculoskeletal: Normal range of motion.  Neurological: Coordination abnormal.  Skin: Skin is warm and dry.  Psychiatric: Her affect is not labile and not inappropriate. Her speech is delayed. She is actively hallucinating. She is not agitated. Thought content is delusional. She does not express impulsivity. She expresses no homicidal and no suicidal ideation. She is noncommunicative. She exhibits abnormal recent memory and abnormal remote memory. She is inattentive.    Review of Systems  Unable to perform ROS: Medical condition    Blood pressure (!) 91/42, pulse (!) 102, temperature 99.3 F (37.4 C), temperature source Oral, resp. rate 12, height _0  (1.676 m), weight 123.6 kg (272 lb 6.4 oz), SpO2 93 %.Body mass index is 43.97 kg/m.  General Appearance: Disheveled  Eye Contact:  Minimal  Speech:  Garbled  Volume:  Decreased  Mood:  Dysphoric and Irritable  Affect:  Inappropriate  Thought Process:  Disorganized  Orientation:  Negative  Thought Content:  Negative  Suicidal Thoughts:  No  Homicidal Thoughts:  No  Memory:  Negative   Judgement:  Negative  Insight:  Negative  Psychomotor Activity:  Restlessness  Concentration:  Concentration: Poor  Recall:  Poor  Fund of Knowledge:  Fair  Language:  Fair  Akathisia:  No  Handed:  Right  AIMS (if indicated):     Assets:  Financial Resources/Insurance Housing  ADL's:  Impaired  Cognition:  Impaired,  Mild and Moderate  Sleep:        Treatment Plan Summary: Daily contact with patient to assess and evaluate symptoms and progress in treatment, Medication management and Plan Continue current medications. Appears to be tolerating Zyprexa along with her clozapine fine. Supportive counseling and encouragement. I will continue to follow-up as needed.  Disposition: Patient does not meet criteria for psychiatric inpatient admission.  Alethia Berthold, MD 10/10/2016 5:45 PM

## 2016-10-10 NOTE — Care Management Note (Addendum)
Case Management Note  Patient Details  Name: [name] MRN: [id number] Date of Birth: 06/13/1971  Subjective/Objective:         PPD results faxed to Alda Lea at [facility] today. Creatinine climbing.            Action/Plan:   Expected Discharge Date:                  Expected Discharge Plan:     In-House Referral:     Discharge planning Services     Post Acute Care Choice:    Choice offered to:     DME Arranged:    DME Agency:     HH Arranged:    HH Agency:     Status of Service:     If discussed at [employer] of Stay Meetings, dates discussed:    Additional Comments:  Johnette Teigen A, RN 10/10/2016, 12:34 PM

## 2016-10-10 NOTE — Progress Notes (Signed)
[facility]  ROUNDING NOTE   Subjective:   s Creatinine has worsened today UOP 1125 cc (foley collection) Still has loose stools, rectal tube Low potassium noted    Objective:  Vital signs in last 24 hours:  Temp:  [98.3 F (36.8 C)-100.9 F (38.3 C)] 99.3 F (37.4 C) (12/11 1117) Pulse Rate:  [95-102] 102 (12/11 1117) Resp:  [12-19] 12 (12/11 1117) BP: (91-103)/(40-48) 91/42 (12/11 1117) SpO2:  [92 %-96 %] 93 % (12/11 1117) Weight:  [123.6 kg (272 lb 6.4 oz)] 123.6 kg (272 lb 6.4 oz) (12/11 [id number])  Weight change: -0.045 kg (-1.6 oz) Filed Weights   10/08/16 0500 10/09/16 0445 10/10/16 0627  Weight: 122.7 kg (270 lb 9.6 oz) 123.6 kg (272 lb 8 oz) 123.6 kg (272 lb 6.4 oz)    Intake/Output: I/O last 3 completed shifts: In: 1120 [P.O.:1120] Out: 3225 [Urine:1675; [id number]   Intake/Output this shift:  Total I/O In: 240 [P.O.:240] Out: -   Physical Exam: General: No acute distress  Head: OM moist  Eyes: Anicteric  Neck: Supple, trachea midline  Lungs:  clear, normal effort   Heart: S1S2 No rubs   Abdomen:  Soft, non tender  Extremities: Trace peripheral edema.  Neurologic: sedated  Skin: No lesions  Access: L IJ temporary dialysis catheter 09/28/16  Foley  Basic Metabolic Panel:  Recent Labs Lab 10/04/16 0020 10/04/16 0423 10/04/16 [id number] 10/05/16 0427 10/06/16 0457 10/07/16 0434 10/08/16 0545 10/09/16 0513 10/10/16 0625  NA 141 141 142 141 147* 149* 142 136 137  K 3.8 3.9 4.0 3.2* 3.3* 3.1* 3.3* 3.2* 3.2*  CL 107 107 110 108 115* 116* 105 102 103  CO2 28 29 27 26 25 24 30 28 26   GLUCOSE 107* 99 92 94 118* 107* 92 99 93  BUN 19 20 18  27* 30* 37* 21* 32* 42*  CREATININE 1.24* 1.11* 1.16* 2.17* 3.28* 4.19* 3.44* 4.71* 5.60*  CALCIUM 8.4* 8.4* 8.5* 8.0* 8.1* 8.0* 7.6* 7.5* 7.6*  MG 1.8 1.7 1.7 1.7 1.8  --   --   --   --   PHOS 1.9* 2.2* 1.9* 3.2 4.2 4.3  --  4.5 4.4    Liver Function Tests:  Recent Labs Lab 10/05/16 0427  10/06/16 0457 10/07/16 0434 10/09/16 0513 10/10/16 0625  ALBUMIN 2.3* 2.5* 2.3* 1.9* 1.9*   No results for input(s): LIPASE, AMYLASE in the last 168 hours. No results for input(s): AMMONIA in the last 168 hours.  CBC:  Recent Labs Lab 10/05/16 0427 10/06/16 0457 10/07/16 0434 10/09/16 0513 10/10/16 0625  WBC 19.1* 24.2* 17.2* 13.9* 13.1*  NEUTROABS  --   --  11.0*  --   --   HGB 7.9* 8.9* 7.8* 7.1* 6.8*  HCT 23.6* 27.2* 23.6* 21.3* 20.8*  MCV 91.9 92.9 93.4 93.4 93.5  PLT 150 235 239 313 349    Cardiac Enzymes: No results for input(s): CKTOTAL, CKMB, CKMBINDEX, TROPONINI in the last 168 hours.  BNP: Invalid input(s): POCBNP  CBG:  Recent Labs Lab 10/09/16 2109 10/10/16 0013 10/10/16 0623 10/10/16 0740 10/10/16 1117  GLUCAP 95 91 99 94 119*    Microbiology: Results for orders placed or performed during the hospital encounter of 09/27/16  Blood Culture (routine x 2)     Status: None   Collection Time: 09/27/16  6:02 AM  Result Value Ref Range Status   Specimen Description BLOOD  R AC  Final   Special Requests   Final    BOTTLES DRAWN  AEROBIC AND ANAEROBIC  AER 3 ML ANA 4 ML   Culture NO GROWTH 5 DAYS  Final   Report Status 10/02/2016 FINAL  Final  Blood Culture (routine x 2)     Status: None   Collection Time: 09/27/16  6:03 AM  Result Value Ref Range Status   Specimen Description BLOOD  R ARM  Final   Special Requests   Final    BOTTLES DRAWN AEROBIC AND ANAEROBIC  AER 6 ML ANA 10 ML   Culture NO GROWTH 5 DAYS  Final   Report Status 10/02/2016 FINAL  Final  Urine culture     Status: None   Collection Time: 09/27/16  9:30 AM  Result Value Ref Range Status   Specimen Description URINE, RANDOM  Final   Special Requests NONE  Final   Culture NO GROWTH Performed at [facility]   Final   Report Status 09/28/2016 FINAL  Final  MRSA PCR Screening     Status: None   Collection Time: 09/27/16 11:09 AM  Result Value Ref Range Status   MRSA by PCR  NEGATIVE NEGATIVE Final    Comment:        The GeneXpert MRSA Assay (FDA approved for NASAL specimens only), is one component of a comprehensive MRSA colonization surveillance program. It is not intended to diagnose MRSA infection nor to guide or monitor treatment for MRSA infections.   C difficile quick scan w PCR reflex     Status: None   Collection Time: 10/04/16 12:20 AM  Result Value Ref Range Status   C Diff antigen NEGATIVE NEGATIVE Final   C Diff toxin NEGATIVE NEGATIVE Final   C Diff interpretation No C. difficile detected.  Final  Gastrointestinal Panel by PCR , Stool     Status: None   Collection Time: 10/08/16  7:00 PM  Result Value Ref Range Status   Campylobacter species NOT DETECTED NOT DETECTED Final   Plesimonas shigelloides NOT DETECTED NOT DETECTED Final   Salmonella species NOT DETECTED NOT DETECTED Final   Yersinia enterocolitica NOT DETECTED NOT DETECTED Final   Vibrio species NOT DETECTED NOT DETECTED Final   Vibrio cholerae NOT DETECTED NOT DETECTED Final   Enteroaggregative E coli (EAEC) NOT DETECTED NOT DETECTED Final   Enteropathogenic E coli (EPEC) NOT DETECTED NOT DETECTED Final   Enterotoxigenic E coli (ETEC) NOT DETECTED NOT DETECTED Final   Shiga like toxin producing E coli (STEC) NOT DETECTED NOT DETECTED Final   Shigella/Enteroinvasive E coli (EIEC) NOT DETECTED NOT DETECTED Final   Cryptosporidium NOT DETECTED NOT DETECTED Final   Cyclospora cayetanensis NOT DETECTED NOT DETECTED Final   Entamoeba histolytica NOT DETECTED NOT DETECTED Final   Giardia lamblia NOT DETECTED NOT DETECTED Final   Adenovirus F40/41 NOT DETECTED NOT DETECTED Final   Astrovirus NOT DETECTED NOT DETECTED Final   Norovirus GI/GII NOT DETECTED NOT DETECTED Final   Rotavirus A NOT DETECTED NOT DETECTED Final   Sapovirus (I, II, IV, and V) NOT DETECTED NOT DETECTED Final    Coagulation Studies: No results for input(s): LABPROT, INR in the last 72  hours.  Urinalysis: No results for input(s): COLORURINE, LABSPEC, PHURINE, GLUCOSEU, HGBUR, BILIRUBINUR, KETONESUR, PROTEINUR, UROBILINOGEN, NITRITE, LEUKOCYTESUR in the last 72 hours.  Invalid input(s): APPERANCEUR    Imaging: No results found.   Medications:    . bacitracin   Topical BID  . chlorhexidine  15 mL Mouth Rinse BID  . cholestyramine light  4 g Oral BID  .  citalopram  40 mg Oral Daily  . cloZAPine  200 mg Oral Daily  . epoetin (EPOGEN/PROCRIT) injection  10,000 Units Intravenous Once  . famotidine  20 mg Oral Daily  . feeding supplement (ENSURE ENLIVE)  237 mL Oral BID BM  . feeding supplement (NEPRO CARB STEADY)  237 mL Oral TID BM  . haloperidol lactate  5 mg Intravenous Once  . heparin  5,000 Units Subcutaneous Q8H  . hydrocortisone sod succinate (SOLU-CORTEF) inj  25 mg Intravenous Daily  . mouth rinse  15 mL Mouth Rinse q12n4p  . mesalamine  500 mg Oral TID  . OLANZapine zydis  10 mg Oral BID WC  . sodium chloride flush  10-40 mL Intracatheter Q12H  . sodium chloride flush  3 mL Intravenous Q12H   sodium chloride, sodium chloride, acetaminophen, LORazepam, ondansetron (ZOFRAN) IV  Assessment/ Plan:  45 y.o.white female with a PMHx of Crohn's disease, depression, diabetes mellitus type 2, GERD, hiatal hernia, hyperlipidemia, hypertension, hypothyroidism, obesity, schizophrenia, traumatic brain injury, vitamin D deficiency, who was admitted to [facility] on 09/27/2016   1.  Acute renal failure: CRRT from 11/29 to 12/5. Hemodialysis treatment on 12/8 Acute renal failure sepsis, hypotension, and hypoxemia No baseline creatinine for 3 years.  Nonoliguric urine output.  S Creatinine is worse today If further hemodialysis treatments required, plan on permcath placement.   2.  Hypernatremia: improved with dialysis treatment  3. Hypokalemia - Likely GI losses - replace as necessary    LOS: 13 [name] 12/11/20172:37 PM

## 2016-10-10 NOTE — Care Management Important Message (Signed)
Important Message  Patient Details  Name: [name] MRN: [id number] Date of Birth: [birth year]   Medicare Important Message Given:  Yes    Eiley Mcginnity A, RN 10/10/2016, 7:54 AM

## 2016-10-11 DIAGNOSIS — R197 Diarrhea, unspecified: Secondary | ICD-10-CM

## 2016-10-11 LAB — GLUCOSE, CAPILLARY
GLUCOSE-CAPILLARY: 126 mg/dL — AB (ref 65–99)
GLUCOSE-CAPILLARY: 106 mg/dL — AB (ref 65–99)
Glucose-Capillary: 100 mg/dL — ABNORMAL HIGH (ref 65–99)
Glucose-Capillary: 118 mg/dL — ABNORMAL HIGH (ref 65–99)
Glucose-Capillary: 91 mg/dL (ref 65–99)
Glucose-Capillary: 92 mg/dL (ref 65–99)
Glucose-Capillary: 98 mg/dL (ref 65–99)

## 2016-10-11 NOTE — Care Management (Signed)
H/P , PPD results have been sent but not received by Malachy Mood with [facility].  Will be the third H/p that has been sent. REsending PPD

## 2016-10-11 NOTE — Consult Note (Signed)
Jonathon Bellows MD  [address] [place],  [address] Phone: [phone] Fax : [phone]  Consultation  Referring Provider:     No ref. provider found Primary Care Physician:  Lorelee Market, MD Primary Gastroenterologist:           Reason for Consultation:     Diarrhea  Date of Admission:  09/27/2016 Date of Consultation:  10/11/2016         HPI:   [name] is a 45 y.o. female was admitted into the ICU with septic shock secondary to UTI , encephelopathy and respiratory failure. S/p Extubation 09/30/16  . ARF on CRRT. Was on vasopressors.   We have been consulted for diarrhea. Review of records suggest began on 10/08/16 .   GI PCR negative 10/08/16  C diff negative 10/04/16 .  When I went into the room , she was sitting comfortably in the chair with a food tray in front ofher. Denies any abdominal pain. Said she consumed 3-4 cans of ginger ale, lots of milk. The rectal tube with bag had brown non bloody liquid stool , foul smelling.    Past Medical History:  Diagnosis Date  . Abnormal mammogram, unspecified 2013   Prev. cytology,hypercellular smears without evidence of malignant cells. The cytopathologist questioned if samples truly representative. Care taken during sampling and is felt to be representative.  . Breast screening, unspecified 2013  . Broken leg    age 33  . Cellulitis   . Crohn's disease ([place]) 2013  . Depression   . Diabetes mellitus without complication (HCC)    non insulin dependent  . Early menopause   . Edema   . GERD (gastroesophageal reflux disease)   . Hiatal hernia 2013  . Hyperlipidemia   . Hypertension   . Hypothyroidism   . 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 Obesity, unspecified 2013  . Other sign and symptom in breast 2013   Right bst US,lower outer quadrant,A single 0.3x0.4x0.6cm hypoechoic mass with slightly lobulated borders with adjacent 0.3x0.4x0.5cm mass was noted. The 1st was 5cm from nipple, 2nd  at 8 cm from the nipple. The previous lesion aspirate was at 3 0'clock position. These lesions are thought to account for the mammographic abnormality. Minimal interval change on [place].   [facility])   . Rib fracture    age 71  . Schizophrenia ([place])   . TBI (traumatic brain injury) ([place])   . Thyroid disease    hypothyroid  . Vitamin D deficiency     Past Surgical History:  Procedure Laterality Date  . COLONOSCOPY WITH PROPOFOL N/A 02/16/2016   Procedure: COLONOSCOPY WITH PROPOFOL;  Surgeon: Lollie Sails, MD;  Location: [facility] ENDOSCOPY;  Service: Endoscopy;  Laterality: N/A;  . COLONOSCOPY WITH PROPOFOL N/A 02/22/2016   Procedure: COLONOSCOPY WITH PROPOFOL;  Surgeon: Lollie Sails, MD;  Location: [facility] ENDOSCOPY;  Service: Endoscopy;  Laterality: N/A;  . EYE SURGERY Left 2013   cataract surgery  . TUMOR EXCISION Right    age of 88, tumor removed from [place]    Prior to Admission medications   Medication Sig Start Date End Date Taking? Authorizing Provider  acetaminophen (TYLENOL) 325 MG tablet Take 650 mg by mouth every 6 (six) hours as needed for mild pain. Every 4 to 6 hours PRN   Yes Historical Provider, MD  albuterol (PROVENTIL HFA;VENTOLIN HFA) 108 (90 Base) MCG/ACT inhaler Inhale 1 puff into the lungs 4 (four) times  daily as needed for wheezing or shortness of breath.   Yes Historical Provider, MD  calcium carbonate (OSCAL) 1500 (600 Ca) MG TABS tablet Take 600 mg of elemental calcium by mouth 2 (two) times daily with a meal.   Yes Historical Provider, MD  Cholecalciferol 5000 units TABS Take 5,000 Units by mouth daily.   Yes Historical Provider, MD  citalopram (CELEXA) 40 MG tablet Take 40 mg by mouth daily.   Yes Historical Provider, MD  cloZAPine (CLOZARIL) 100 MG tablet Take 200 mg by mouth at bedtime.    Yes Historical Provider, MD  diphenhydrAMINE (BENADRYL) 50 MG capsule Take 50 mg by mouth at bedtime.   Yes Historical Provider, MD  furosemide (LASIX) 40 MG  tablet Take 40 mg by mouth daily.   Yes Historical Provider, MD  gemfibrozil (LOPID) 600 MG tablet Take 600 mg by mouth 2 (two) times daily.   Yes Historical Provider, MD  levothyroxine (SYNTHROID, LEVOTHROID) 25 MCG tablet Take 25 mcg by mouth every morning.   Yes Historical Provider, MD  lisinopril (PRINIVIL,ZESTRIL) 5 MG tablet Take 5 mg by mouth daily.   Yes Historical Provider, MD  LORazepam (ATIVAN) 0.5 MG tablet Take 0.5 mg by mouth every 4 (four) hours as needed for anxiety.   Yes Historical Provider, MD  LORazepam (ATIVAN) 1 MG tablet Take 1 mg by mouth every morning.   Yes Historical Provider, MD  mesalamine (APRISO) 0.375 g 24 hr capsule Take 1,500 mg by mouth every morning.   Yes Historical Provider, MD  metFORMIN (GLUCOPHAGE) 500 MG tablet Take 500 mg by mouth daily.   Yes Historical Provider, MD  omega-3 acid ethyl esters (LOVAZA) 1 g capsule Take 2 g by mouth every morning.    Yes Historical Provider, MD  pantoprazole (PROTONIX) 40 MG tablet Take 40 mg by mouth daily.    Yes Historical Provider, MD  potassium chloride (K-DUR) 10 MEQ tablet Take 20 mEq by mouth 2 (two) times daily.   Yes Historical Provider, MD  pseudoephedrine-guaifenesin (MUCINEX D) 60-600 MG 12 hr tablet Take 2 tablets by mouth every 12 (twelve) hours as needed for congestion.    Yes Historical Provider, MD  risperiDONE (RISPERDAL M-TAB) 1 MG disintegrating tablet Take 1 mg by mouth 2 (two) times daily.   Yes Historical Provider, MD  simvastatin (ZOCOR) 20 MG tablet Take 20 mg by mouth at bedtime.   Yes Historical Provider, MD  spironolactone (ALDACTONE) 25 MG tablet Take 50 mg by mouth daily.   Yes Historical Provider, MD  vitamin B-12 (CYANOCOBALAMIN) 1000 MCG tablet Take 1,000 mcg by mouth daily.   Yes Historical Provider, MD    Family History  Problem Relation Age of Onset  . Cancer Mother 91    colon  . Cancer Paternal Aunt      Social History  Substance Use Topics  . Smoking status: Never Smoker  .  Smokeless tobacco: Never Used  . Alcohol use No    Allergies as of 09/27/2016 - Review Complete 09/27/2016  Allergen Reaction Noted  . Peanut oil Other (See Comments) 07/01/2014  . Risperidone and related Cough 07/01/2014    Review of Systems:    All systems reviewed and negative except where noted in HPI.   Physical Exam:  Vital signs in last 24 hours: Temp:  [99.1 F (37.3 C)-99.6 F (37.6 C)] 99.3 F (37.4 C) (12/12 0923) Pulse Rate:  [98-102] 99 (12/12 0924) Resp:  [12-18] 18 (12/12 0315) BP: (78-110)/(32-42) 78/32 (12/12 0923)  SpO2:  [91 %-97 %] 93 % (12/12 0924) Weight:  [270 lb 9.6 oz (122.7 kg)] 270 lb 9.6 oz (122.7 kg) (12/12 0315) Last BM Date: 10/10/16 General:   Pleasant, cooperative in NAD Head:  Normocephalic and atraumatic. Eyes:   No icterus.   Conjunctiva pink. PERRLA. Ears:  Normal auditory acuity. Neck:  Supple; no masses or thyroidomegaly Lungs: Respirations even and unlabored. Lungs clear to auscultation bilaterally.   No wheezes, crackles, or rhonchi.  Heart:  Regular rate and rhythm;  Without murmur, clicks, rubs or gallops Abdomen:  Soft, nondistended, nontender. Normal bowel sounds. No appreciable masses or hepatomegaly.  No rebound or guarding.  Psych:  Alert and cooperative. Normal affect.Some difficulty answering questions.  Current Facility-Administered Medications  Medication Dose Route Frequency Provider Last Rate Last Dose  . 0.9 %  sodium chloride infusion  250 mL Intravenous PRN Flora Lipps, MD      . 0.9 %  sodium chloride infusion  250 mL Intravenous PRN Flora Lipps, MD 20 mL/hr at 10/06/16 1600 250 mL at 10/06/16 1600  . acetaminophen (TYLENOL) tablet 650 mg  650 mg Oral Q4H PRN Flora Lipps, MD   650 mg at 10/08/16 1044  . bacitracin ointment   Topical BID Jules Husbands, MD      . chlorhexidine (PERIDEX) 0.12 % solution 15 mL  15 mL Mouth Rinse BID Flora Lipps, MD   15 mL at 10/10/16 2151  . cholestyramine light (PREVALITE) packet 4 g  4 g  Oral BID Theodoro Grist, MD   4 g at 10/10/16 2145  . citalopram (CELEXA) tablet 40 mg  40 mg Oral Daily Gonzella Lex, MD   40 mg at 10/10/16 0927  . cloZAPine (CLOZARIL) tablet 200 mg  200 mg Oral Daily Gonzella Lex, MD   200 mg at 10/10/16 0931  . epoetin alfa (EPOGEN,PROCRIT) injection 10,000 Units  10,000 Units Intravenous Once Lavonia Dana, MD      . famotidine (PEPCID) tablet 20 mg  20 mg Oral Daily Lenis Noon, RPH   20 mg at 10/10/16 [id number]  . feeding supplement (ENSURE ENLIVE) (ENSURE ENLIVE) liquid 237 mL  237 mL Oral BID BM Demetrios Loll, MD   237 mL at 10/10/16 1337  . feeding supplement (NEPRO CARB STEADY) liquid 237 mL  237 mL Oral TID BM Theodoro Grist, MD   237 mL at 10/10/16 1343  . haloperidol lactate (HALDOL) injection 5 mg  5 mg Intravenous Once Harrie Foreman, MD      . heparin injection 5,000 Units  5,000 Units Subcutaneous Q8H Flora Lipps, MD   5,000 Units at 10/10/16 2144  . hydrocortisone sodium succinate (SOLU-CORTEF) 100 MG injection 25 mg  25 mg Intravenous Daily Laverle Hobby, MD   25 mg at 10/10/16 0932  . LORazepam (ATIVAN) tablet 2 mg  2 mg Oral Q6H PRN Laverle Hobby, MD   2 mg at 10/09/16 2317  . MEDLINE mouth rinse  15 mL Mouth Rinse q12n4p Flora Lipps, MD   15 mL at 10/10/16 1724  . mesalamine (PENTASA) CR capsule 500 mg  500 mg Oral TID Theodoro Grist, MD   500 mg at 10/10/16 2144  . OLANZapine zydis (ZYPREXA) disintegrating tablet 10 mg  10 mg Oral BID WC Gonzella Lex, MD   10 mg at 10/10/16 1200  . ondansetron (ZOFRAN) injection 4 mg  4 mg Intravenous Q6H PRN Flora Lipps, MD      . sodium chloride flush (NS)  0.9 % injection 10-40 mL  10-40 mL Intracatheter Q12H Flora Lipps, MD   10 mL at 10/10/16 2150  . sodium chloride flush (NS) 0.9 % injection 3 mL  3 mL Intravenous Q12H Flora Lipps, MD   3 mL at 10/10/16 2151   LAB RESULTS:  Recent Labs  10/09/16 0513 10/10/16 0625  WBC 13.9* 13.1*  HGB 7.1* 6.8*  HCT 21.3* 20.8*  PLT 313 349    BMET  Recent Labs  10/09/16 0513 10/10/16 0625  NA 136 137  K 3.2* 3.2*  CL 102 103  CO2 28 26  GLUCOSE 99 93  BUN 32* 42*  CREATININE 4.71* 5.60*  CALCIUM 7.5* 7.6*   LFT  Recent Labs  10/10/16 0625  ALBUMIN 1.9*   PT/INR No results for input(s): LABPROT, INR in the last 72 hours.  STUDIES: No results found.    Impression / Plan:   [name] is a 45 y.o. y/o female with admission for sepsis, respiratory failure, off pressors and extubated in the ICU. We have been consulted for acute onset large volume diarrhea. Infectious causes has been ruled out with stools tests. Differentials include possible mesenteric ischemia from hypotension which should improve with conservative management vs an osmotic diarrhea from oral diet which seems to be very rich high fructose corn from the ginger ale and lactose from the milk products she is consuming  .   Plan: 1. D/c Ginger ale, dairy products, Start [employer] now. IF diarrhea slows down then advance diet gradually .  2. If it does not will plan for sigmoidoscopy with biopsies tomorrow or Friday  3. Since infection has been ruled out can commence on imodium for symptom control 4. Correct electrolytes 5. IF mesalamine is given for diarrhea- asses if improved , if worsened consider stopping it as mesalamine by itself can cause diarrhea at times.    Thank you for involving me in the care of this patient.      LOS: 14 days   Jonathon Bellows, MD  10/11/2016, 10:33 AM

## 2016-10-11 NOTE — Clinical Social Work Note (Signed)
MSW was informed by [facility] that patient will have to have a QMHP meet with patient and complete assessment before [facility] number can be assigned.  MSW to continue to follow patient's progress througout discharge planning.  [name]. [name], MSW [phone]  Mon-Fri 8a-4:30p 10/11/2016 4:50 PM

## 2016-10-11 NOTE — Progress Notes (Signed)
[facility]  ROUNDING NOTE   Subjective:   No new labs available today UOP  not recorded since Foley has been removed Still has loose stools, rectal tube Low potassium noted Patient accidentally pulled out her dialysis catheter Creatinine was 5.60 yesterday which is critically high    Objective:  Vital signs in last 24 hours:  Temp:  [98.1 F (36.7 C)-99.6 F (37.6 C)] 98.1 F (36.7 C) (12/12 1126) Pulse Rate:  [93-102] 93 (12/12 1126) Resp:  [18] 18 (12/12 1126) BP: (78-110)/(32-53) 92/53 (12/12 1126) SpO2:  [82 %-97 %] 82 % (12/12 1126) Weight:  [122.7 kg (270 lb 9.6 oz)] 122.7 kg (270 lb 9.6 oz) (12/12 0315)  Weight change: -0.816 kg (-1 lb 12.8 oz) Filed Weights   10/09/16 0445 10/10/16 0627 10/11/16 0315  Weight: 123.6 kg (272 lb 8 oz) 123.6 kg (272 lb 6.4 oz) 122.7 kg (270 lb 9.6 oz)    Intake/Output: I/O last 3 completed shifts: In: 47 [P.O.:1110; I.V.:13; Other:300] Out: 2760 [Urine:525; Stool:2235]   Intake/Output this shift:  Total I/O In: 240 [P.O.:240] Out: 900 [Urine:900]  Physical Exam: General: No acute distress  Head: OM moist  Eyes: Anicteric  Neck: Supple, trachea midline  Lungs:  clear, normal effort   Heart: S1S2 No rubs   Abdomen:  Soft, non tender  Extremities: Trace peripheral edema.  Neurologic: sedated  Skin: No lesions  Access: L IJ temporary dialysis catheter 09/28/16  Foley  Basic Metabolic Panel:  Recent Labs Lab 10/05/16 0427 10/06/16 0457 10/07/16 0434 10/08/16 0545 10/09/16 0513 10/10/16 0625  NA 141 147* 149* 142 136 137  K 3.2* 3.3* 3.1* 3.3* 3.2* 3.2*  CL 108 115* 116* 105 102 103  CO2 26 25 24 30 28 26   GLUCOSE 94 118* 107* 92 99 93  BUN 27* 30* 37* 21* 32* 42*  CREATININE 2.17* 3.28* 4.19* 3.44* 4.71* 5.60*  CALCIUM 8.0* 8.1* 8.0* 7.6* 7.5* 7.6*  MG 1.7 1.8  --   --   --   --   PHOS 3.2 4.2 4.3  --  4.5 4.4    Liver Function Tests:  Recent Labs Lab 10/05/16 0427 10/06/16 0457  10/07/16 0434 10/09/16 0513 10/10/16 0625  ALBUMIN 2.3* 2.5* 2.3* 1.9* 1.9*   No results for input(s): LIPASE, AMYLASE in the last 168 hours. No results for input(s): AMMONIA in the last 168 hours.  CBC:  Recent Labs Lab 10/05/16 0427 10/06/16 0457 10/07/16 0434 10/09/16 0513 10/10/16 0625  WBC 19.1* 24.2* 17.2* 13.9* 13.1*  NEUTROABS  --   --  11.0*  --   --   HGB 7.9* 8.9* 7.8* 7.1* 6.8*  HCT 23.6* 27.2* 23.6* 21.3* 20.8*  MCV 91.9 92.9 93.4 93.4 93.5  PLT 150 235 239 313 349    Cardiac Enzymes: No results for input(s): CKTOTAL, CKMB, CKMBINDEX, TROPONINI in the last 168 hours.  BNP: Invalid input(s): POCBNP  CBG:  Recent Labs Lab 10/10/16 2041 10/11/16 0059 10/11/16 0501 10/11/16 0724 10/11/16 1123  GLUCAP 100* 98 100* 26 126*    Microbiology: Results for orders placed or performed during the hospital encounter of 09/27/16  Blood Culture (routine x 2)     Status: None   Collection Time: 09/27/16  6:02 AM  Result Value Ref Range Status   Specimen Description BLOOD  R AC  Final   Special Requests   Final    BOTTLES DRAWN AEROBIC AND ANAEROBIC  AER 3 ML ANA 4 ML   Culture  NO GROWTH 5 DAYS  Final   Report Status 10/02/2016 FINAL  Final  Blood Culture (routine x 2)     Status: None   Collection Time: 09/27/16  6:03 AM  Result Value Ref Range Status   Specimen Description BLOOD  R ARM  Final   Special Requests   Final    BOTTLES DRAWN AEROBIC AND ANAEROBIC  AER 6 ML ANA 10 ML   Culture NO GROWTH 5 DAYS  Final   Report Status 10/02/2016 FINAL  Final  Urine culture     Status: None   Collection Time: 09/27/16  9:30 AM  Result Value Ref Range Status   Specimen Description URINE, RANDOM  Final   Special Requests NONE  Final   Culture NO GROWTH Performed at [facility]   Final   Report Status 09/28/2016 FINAL  Final  MRSA PCR Screening     Status: None   Collection Time: 09/27/16 11:09 AM  Result Value Ref Range Status   MRSA by PCR NEGATIVE  NEGATIVE Final    Comment:        The GeneXpert MRSA Assay (FDA approved for NASAL specimens only), is one component of a comprehensive MRSA colonization surveillance program. It is not intended to diagnose MRSA infection nor to guide or monitor treatment for MRSA infections.   C difficile quick scan w PCR reflex     Status: None   Collection Time: 10/04/16 12:20 AM  Result Value Ref Range Status   C Diff antigen NEGATIVE NEGATIVE Final   C Diff toxin NEGATIVE NEGATIVE Final   C Diff interpretation No C. difficile detected.  Final  Gastrointestinal Panel by PCR , Stool     Status: None   Collection Time: 10/08/16  7:00 PM  Result Value Ref Range Status   Campylobacter species NOT DETECTED NOT DETECTED Final   Plesimonas shigelloides NOT DETECTED NOT DETECTED Final   Salmonella species NOT DETECTED NOT DETECTED Final   Yersinia enterocolitica NOT DETECTED NOT DETECTED Final   Vibrio species NOT DETECTED NOT DETECTED Final   Vibrio cholerae NOT DETECTED NOT DETECTED Final   Enteroaggregative E coli (EAEC) NOT DETECTED NOT DETECTED Final   Enteropathogenic E coli (EPEC) NOT DETECTED NOT DETECTED Final   Enterotoxigenic E coli (ETEC) NOT DETECTED NOT DETECTED Final   Shiga like toxin producing E coli (STEC) NOT DETECTED NOT DETECTED Final   Shigella/Enteroinvasive E coli (EIEC) NOT DETECTED NOT DETECTED Final   Cryptosporidium NOT DETECTED NOT DETECTED Final   Cyclospora cayetanensis NOT DETECTED NOT DETECTED Final   Entamoeba histolytica NOT DETECTED NOT DETECTED Final   Giardia lamblia NOT DETECTED NOT DETECTED Final   Adenovirus F40/41 NOT DETECTED NOT DETECTED Final   Astrovirus NOT DETECTED NOT DETECTED Final   Norovirus GI/GII NOT DETECTED NOT DETECTED Final   Rotavirus A NOT DETECTED NOT DETECTED Final   Sapovirus (I, II, IV, and V) NOT DETECTED NOT DETECTED Final    Coagulation Studies: No results for input(s): LABPROT, INR in the last 72 hours.  Urinalysis: No  results for input(s): COLORURINE, LABSPEC, PHURINE, GLUCOSEU, HGBUR, BILIRUBINUR, KETONESUR, PROTEINUR, UROBILINOGEN, NITRITE, LEUKOCYTESUR in the last 72 hours.  Invalid input(s): APPERANCEUR    Imaging: No results found.   Medications:    . bacitracin   Topical BID  . chlorhexidine  15 mL Mouth Rinse BID  . cholestyramine light  4 g Oral BID  . citalopram  40 mg Oral Daily  . cloZAPine  200 mg Oral  Daily  . epoetin (EPOGEN/PROCRIT) injection  10,000 Units Intravenous Once  . famotidine  20 mg Oral Daily  . feeding supplement (ENSURE ENLIVE)  237 mL Oral BID BM  . feeding supplement (NEPRO CARB STEADY)  237 mL Oral TID BM  . haloperidol lactate  5 mg Intravenous Once  . heparin  5,000 Units Subcutaneous Q8H  . hydrocortisone sod succinate (SOLU-CORTEF) inj  25 mg Intravenous Daily  . mouth rinse  15 mL Mouth Rinse q12n4p  . mesalamine  500 mg Oral TID  . OLANZapine zydis  10 mg Oral BID WC  . sodium chloride flush  10-40 mL Intracatheter Q12H  . sodium chloride flush  3 mL Intravenous Q12H   sodium chloride, sodium chloride, acetaminophen, LORazepam, ondansetron (ZOFRAN) IV  Assessment/ Plan:  45 y.o.white female with a PMHx of Crohn's disease, depression, diabetes mellitus type 2, GERD, hiatal hernia, hyperlipidemia, hypertension, hypothyroidism, obesity, schizophrenia, traumatic brain injury, vitamin D deficiency, who was admitted to [facility] on 09/27/2016   1.  Acute renal failure: CRRT from 11/29 to 12/5. Hemodialysis treatment on 12/8 Acute renal failure sepsis, hypotension, and hypoxemia No baseline creatinine for 3 years.  Nonoliguric urine output.  No new serum creatinine available today If further hemodialysis treatments required, plan on permcath placement.   2.  Hypokalemia - Likely GI losses - replace as necessary    LOS: 14 Art Levan 12/12/20174:41 PM

## 2016-10-11 NOTE — Progress Notes (Signed)
[facility] [place] NAME: Tinashe Hoss    MR#:  [id number]  DATE OF BIRTH:  26-Feb-1971  SUBJECTIVE:  CHIEF COMPLAINT:  No chief complaint on file.  The patient has no complaints, she is more alert, answers some questions appropriately. Loose stools, rectal tube is replaced, C. difficile was tested and negative. Gastrointestinal panel is negative as well. Initiated on cholestyramine and mesalamine with no significant improvement in stool consistency. The patient was seen by gastroenterologist and was initiated on [place] since there was a concern of possible high fructose corn and milk product consumption. REVIEW OF SYSTEMS:  Review of Systems  Unable to perform ROS: Mental status change  Patient is confused, although knows that she is in the hospital  DRUG ALLERGIES:   Allergies  Allergen Reactions  . Peanut Oil Other (See Comments)    Face turns red  . Risperidone And Related Cough   VITALS:  Blood pressure (!) 92/53, pulse 93, temperature 98.1 F (36.7 C), temperature source Oral, resp. rate 18, height 5\' 6"  (1.676 m), weight 122.7 kg (270 lb 9.6 oz), SpO2 (!) 82 %. PHYSICAL EXAMINATION:  Physical Exam  Constitutional: She is well-developed, well-nourished, and in no distress.  Morbid obesity.  HENT:  Head: Normocephalic.  Mouth/Throat: Oropharynx is clear and moist.  Eyes: Conjunctivae and EOM are normal.  Neck: Normal range of motion. Neck supple. No JVD present. No tracheal deviation present.  Cardiovascular: Normal rate, regular rhythm and normal heart sounds.   Pulmonary/Chest: Effort normal and breath sounds normal. No respiratory distress. She has no wheezes. She has no rales.  Abdominal: Soft. Bowel sounds are normal. She exhibits no distension. There is no tenderness.  Musculoskeletal: She exhibits no edema or tenderness.  Neurological: She is alert. No cranial nerve deficit.  Confused but follow commands.  Skin: No  rash noted. No erythema.   LABORATORY PANEL:   CBC  Recent Labs Lab 10/10/16 0625  WBC 13.1*  HGB 6.8*  HCT 20.8*  PLT 349   ------------------------------------------------------------------------------------------------------------------ Chemistries   Recent Labs Lab 10/06/16 0457  10/10/16 0625  NA 147*  < > 137  K 3.3*  < > 3.2*  CL 115*  < > 103  CO2 25  < > 26  GLUCOSE 118*  < > 93  BUN 30*  < > 42*  CREATININE 3.28*  < > 5.60*  CALCIUM 8.1*  < > 7.6*  MG 1.8  --   --   < > = values in this interval not displayed. RADIOLOGY:  No results found. ASSESSMENT AND PLAN:   45 yo obese white female admitted to the ICU for septic shock?UTI with severe metabolic acidosis, acute encephalopathy and acute respiratory failure;  Status post extubation. ARF on CRRT. All cultures are negative so far.  Acute respiratory failure with hypoxia--improved. Extubated 12/1 off nasal cannula, now on room air with O2 sats of 98%  Severe septic Versus hypovolemic shock, blood pressure is low, Off vasopressor. She has been treated with Zosyn for 10 days, now off antibiotics. ECHO with normal LVEF of approximately 60% Continue Solu-Cortef.The patient continues to have diarrhea, may be fluid depleted. If does not keep up with oral intake, although oral intake has improved and now she eats 100% of offered meals. May need to initiate on IV fluids  AKI possibly related to severe sepsis s/p  CRRT, also started on albumin by nephrology. PermCath is planned, if further needed dialysis, patient's  urinary output was 900 cc after in and out catheterization.  The  patient continues to lose lots of fluids with diarrheal stool. Follow closely and initiate IV fluids if needed  Hyperkalemia-resolved Hypocalcemia, likely alimental Hypophosphatemia, resolved   Leukocytosis, likely due to steroid, improving  Acute encephalopathy, delirium related to severe sepsis, improved . Severe agitation with  underlying schizophrenia. -Psychiatry following, now on Zyprexa and clonazepam, supportive counseling and encouragement was provided by psychiatrist.  Diarrhea, status post rectal tube replacement, C. difficile negative, gastrointestinal panel is negative, ? discontinue mesalamine, continue cholestyramine, appreciate gastroenterologist  recommendations, now on BRAT diet, although stool output  All the records are reviewed and case discussed with Care Management/Social Worker. Management plans discussed with the patient's aunt, and they are in agreement.  CODE STATUS: Full code  TOTAL TIME TAKING CARE OF THIS PATIENT: 30 minutes.   More than 50% of the time was spent in counseling/coordination of care: YES  POSSIBLE D/C IN >3 DAYS, DEPENDING ON CLINICAL CONDITION.   Theodoro Grist M.D on 10/11/2016 at 2:55 PM  Between 7am to 6pm - Pager - [phone]  After 6pm go to [email or web address] - Proofreader  [facility]  Office  [phone]  CC: Primary care physician; Lorelee Market, MD  Note: This dictation was prepared with Dragon dictation along with smaller phrase technology. Any transcriptional errors that result from this process are unintentional.

## 2016-10-11 NOTE — Progress Notes (Signed)
NT went into pt's room to find the rectal tube out of place. New rectal tube obtained, and replaced. Pt tolerated well. Bottom is excoriated, cream & powder applied to bottom. Will continue to monitor. Conley Simmonds, RN, BSN

## 2016-10-11 NOTE — Care Management (Addendum)
Patient has pulled out her temporary dialysis catheter.  Physical therapy unable to treat today due to hypotension.  There may be need to consider behavior issues in outpatient dialysis setting.  Confirmed  Cheryl with [facility] has received  the h/p and PPD results. have been offered the following chair times (The center offers TTS first shift at 6:45 am or would she like MWF)but again- it has not been determined that patient will definitely need outpatient dialysis.  Informed [facility] of this.  Labs are pending from this morning.

## 2016-10-11 NOTE — Consult Note (Signed)
[place] Consult   Reason for Consult:  Follow-up for this 45 year old woman with schizophrenia who is recovering from sepsis. No longer in the ICU. Making significant progress. Referring Physician:  Kasa Patient Identification: [name] MRN:  [id number] Principal Diagnosis: Schizophrenia [facility]) Diagnosis:   Patient Active Problem List   Diagnosis Date Noted  . Scalp laceration [S01.01XA]   . Pressure injury of skin [L89.90] 10/02/2016  . Schizophrenia ([place]) [F20.9] 09/30/2016  . Acute renal failure (ARF) ([place]) [N17.9]   . Respiratory failure ([place]) [J96.90] 09/27/2016  . Other sign and symptom in breast [N64.59]     Total Time spent with patient: 20 minutes  Subjective:   [name] is a 45 y.o. female patient admitted with "I guess I'm okay".  HPI:  Follow-up for December 12. Patient seen. She is awake alert and very appropriate. Admits that she still has hallucinations and feels confused at times but is not feeling agitated. She is oriented to her situation and understands how sick she has been. Past Psychiatric History: Long-standing history of schizophrenia on clozapine and other medications.  Risk to Self: Is patient at risk for suicide?: No Risk to Others:   Prior Inpatient Therapy:   Prior Outpatient Therapy:    Past Medical History:  Past Medical History:  Diagnosis Date  . Abnormal mammogram, unspecified 2013   Prev. cytology,hypercellular smears without evidence of malignant cells. The cytopathologist questioned if samples truly representative. Care taken during sampling and is felt to be representative.  . Breast screening, unspecified 2013  . Broken leg    age 89  . Cellulitis   . Crohn's disease ([place]) 2013  . Depression   . Diabetes mellitus without complication (HCC)    non insulin dependent  . Early menopause   . Edema   . GERD (gastroesophageal reflux disease)   . Hiatal hernia 2013  . Hyperlipidemia   . Hypertension   .  Hypothyroidism   . 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 Obesity, unspecified 2013  . Other sign and symptom in breast 2013   Right bst US,lower outer quadrant,A single 0.3x0.4x0.6cm hypoechoic mass with slightly lobulated borders with adjacent 0.3x0.4x0.5cm mass was noted. The 1st was 5cm from nipple, 2nd at 8 cm from the nipple. The previous lesion aspirate was at 3 0'clock position. These lesions are thought to account for the mammographic abnormality. Minimal interval change on [place].   [facility])   . Rib fracture    age 31  . Schizophrenia ([place])   . TBI (traumatic brain injury) ([place])   . Thyroid disease    hypothyroid  . Vitamin D deficiency     Past Surgical History:  Procedure Laterality Date  . COLONOSCOPY WITH PROPOFOL N/A 02/16/2016   Procedure: COLONOSCOPY WITH PROPOFOL;  Surgeon: Lollie Sails, MD;  Location: [facility] ENDOSCOPY;  Service: Endoscopy;  Laterality: N/A;  . COLONOSCOPY WITH PROPOFOL N/A 02/22/2016   Procedure: COLONOSCOPY WITH PROPOFOL;  Surgeon: Lollie Sails, MD;  Location: [facility] ENDOSCOPY;  Service: Endoscopy;  Laterality: N/A;  . EYE SURGERY Left 2013   cataract surgery  . TUMOR EXCISION Right    age of 29, tumor removed from RUE   Family History:  Family History  Problem Relation Age of Onset  . Cancer Mother 49    colon  . Cancer Paternal Aunt    Family Psychiatric  History: None identified Social History:  History  Alcohol  Use No     History  Drug Use No    Social History   Social History  . Marital status: Single    Spouse name: N/A  . Number of children: N/A  . Years of education: N/A   Social History Main Topics  . Smoking status: Never Smoker  . Smokeless tobacco: Never Used  . Alcohol use No  . Drug use: No  . Sexual activity: Not Asked   Other Topics Concern  . None   Social History Narrative  . None   Additional Social History:    Allergies:   Allergies  Allergen Reactions   . Peanut Oil Other (See Comments)    Face turns red  . Risperidone And Related Cough    Labs:  Results for orders placed or performed during the hospital encounter of 09/27/16 (from the past 48 hour(s))  Glucose, capillary     Status: None   Collection Time: 10/09/16  9:09 PM  Result Value Ref Range   Glucose-Capillary 95 65 - 99 mg/dL  Glucose, capillary     Status: None   Collection Time: 10/10/16 12:13 AM  Result Value Ref Range   Glucose-Capillary 91 65 - 99 mg/dL  Glucose, capillary     Status: None   Collection Time: 10/10/16  6:23 AM  Result Value Ref Range   Glucose-Capillary 99 65 - 99 mg/dL  Renal function panel     Status: Abnormal   Collection Time: 10/10/16  6:25 AM  Result Value Ref Range   Sodium 137 135 - 145 mmol/L   Potassium 3.2 (L) 3.5 - 5.1 mmol/L   Chloride 103 101 - 111 mmol/L   CO2 26 22 - 32 mmol/L   Glucose, Bld 93 65 - 99 mg/dL   BUN 42 (H) 6 - 20 mg/dL   Creatinine, Ser 5.60 (H) 0.44 - 1.00 mg/dL   Calcium 7.6 (L) 8.9 - 10.3 mg/dL   Phosphorus 4.4 2.5 - 4.6 mg/dL   Albumin 1.9 (L) 3.5 - 5.0 g/dL   GFR calc non Af Amer 8 (L) >60 mL/min   GFR calc Af Amer 10 (L) >60 mL/min    Comment: (NOTE) The eGFR has been calculated using the CKD EPI equation. This calculation has not been validated in all clinical situations. eGFR's persistently <60 mL/min signify possible Chronic Kidney Disease.    Anion gap 8 5 - 15  CBC     Status: Abnormal   Collection Time: 10/10/16  6:25 AM  Result Value Ref Range   WBC 13.1 (H) 3.6 - 11.0 K/uL   RBC 2.22 (L) 3.80 - 5.20 MIL/uL   Hemoglobin 6.8 (L) 12.0 - 16.0 g/dL   HCT 20.8 (L) 35.0 - 47.0 %   MCV 93.5 80.0 - 100.0 fL   MCH 30.8 26.0 - 34.0 pg   MCHC 33.0 32.0 - 36.0 g/dL   RDW 14.3 11.5 - 14.5 %   Platelets 349 150 - 440 K/uL  Glucose, capillary     Status: None   Collection Time: 10/10/16  7:40 AM  Result Value Ref Range   Glucose-Capillary 94 65 - 99 mg/dL  Glucose, capillary     Status: Abnormal    Collection Time: 10/10/16 11:17 AM  Result Value Ref Range   Glucose-Capillary 119 (H) 65 - 99 mg/dL  Glucose, capillary     Status: None   Collection Time: 10/10/16  4:59 PM  Result Value Ref Range   Glucose-Capillary 97 65 - 99  mg/dL  Glucose, capillary     Status: Abnormal   Collection Time: 10/10/16  8:41 PM  Result Value Ref Range   Glucose-Capillary 100 (H) 65 - 99 mg/dL  Glucose, capillary     Status: None   Collection Time: 10/11/16 12:59 AM  Result Value Ref Range   Glucose-Capillary 98 65 - 99 mg/dL  Glucose, capillary     Status: Abnormal   Collection Time: 10/11/16  5:01 AM  Result Value Ref Range   Glucose-Capillary 100 (H) 65 - 99 mg/dL  Glucose, capillary     Status: None   Collection Time: 10/11/16  7:24 AM  Result Value Ref Range   Glucose-Capillary 92 65 - 99 mg/dL  Glucose, capillary     Status: Abnormal   Collection Time: 10/11/16 11:23 AM  Result Value Ref Range   Glucose-Capillary 126 (H) 65 - 99 mg/dL  Glucose, capillary     Status: Abnormal   Collection Time: 10/11/16  4:26 PM  Result Value Ref Range   Glucose-Capillary 106 (H) 65 - 99 mg/dL    Current Facility-Administered Medications  Medication Dose Route Frequency Provider Last Rate Last Dose  . 0.9 %  sodium chloride infusion  250 mL Intravenous PRN Flora Lipps, MD      . 0.9 %  sodium chloride infusion  250 mL Intravenous PRN Flora Lipps, MD 20 mL/hr at 10/06/16 1600 250 mL at 10/06/16 1600  . acetaminophen (TYLENOL) tablet 650 mg  650 mg Oral Q4H PRN Flora Lipps, MD   650 mg at 10/08/16 1044  . bacitracin ointment   Topical BID Jules Husbands, MD      . chlorhexidine (PERIDEX) 0.12 % solution 15 mL  15 mL Mouth Rinse BID Flora Lipps, MD   15 mL at 10/11/16 1227  . cholestyramine light (PREVALITE) packet 4 g  4 g Oral BID Theodoro Grist, MD   4 g at 10/11/16 1227  . citalopram (CELEXA) tablet 40 mg  40 mg Oral Daily Gonzella Lex, MD   40 mg at 10/11/16 1228  . cloZAPine (CLOZARIL) tablet 200 mg   200 mg Oral Daily Gonzella Lex, MD   200 mg at 10/11/16 1228  . epoetin alfa (EPOGEN,PROCRIT) injection 10,000 Units  10,000 Units Intravenous Once Lavonia Dana, MD      . famotidine (PEPCID) tablet 20 mg  20 mg Oral Daily Lenis Noon, RPH   20 mg at 10/11/16 1229  . feeding supplement (ENSURE ENLIVE) (ENSURE ENLIVE) liquid 237 mL  237 mL Oral BID BM Demetrios Loll, MD   237 mL at 10/11/16 1229  . feeding supplement (NEPRO CARB STEADY) liquid 237 mL  237 mL Oral TID BM Theodoro Grist, MD   237 mL at 10/11/16 1229  . haloperidol lactate (HALDOL) injection 5 mg  5 mg Intravenous Once Harrie Foreman, MD      . heparin injection 5,000 Units  5,000 Units Subcutaneous Q8H Flora Lipps, MD   5,000 Units at 10/11/16 1728  . hydrocortisone sodium succinate (SOLU-CORTEF) 100 MG injection 25 mg  25 mg Intravenous Daily Laverle Hobby, MD   25 mg at 10/11/16 1227  . LORazepam (ATIVAN) tablet 2 mg  2 mg Oral Q6H PRN Laverle Hobby, MD   2 mg at 10/09/16 2317  . MEDLINE mouth rinse  15 mL Mouth Rinse q12n4p Flora Lipps, MD   15 mL at 10/11/16 1638  . mesalamine (PENTASA) CR capsule 500 mg  500 mg Oral TID  Theodoro Grist, MD   500 mg at 10/11/16 1230  . OLANZapine zydis (ZYPREXA) disintegrating tablet 10 mg  10 mg Oral BID WC Gonzella Lex, MD   10 mg at 10/11/16 1228  . ondansetron (ZOFRAN) injection 4 mg  4 mg Intravenous Q6H PRN Flora Lipps, MD      . sodium chloride flush (NS) 0.9 % injection 10-40 mL  10-40 mL Intracatheter Q12H Flora Lipps, MD   10 mL at 10/10/16 2150  . sodium chloride flush (NS) 0.9 % injection 3 mL  3 mL Intravenous Q12H Flora Lipps, MD   3 mL at 10/10/16 2151    Musculoskeletal: Strength & Muscle Tone: decreased Gait & Station: unable to stand Patient leans: N/A  Psychiatric Specialty Exam: Physical Exam  Nursing note and vitals reviewed. Constitutional: She appears well-developed and well-nourished. No distress.  HENT:  Head: Normocephalic and atraumatic.  Eyes:  Conjunctivae are normal. Pupils are equal, round, and reactive to light.  Neck: Normal range of motion.  Cardiovascular: Regular rhythm and normal heart sounds.   Respiratory: Effort normal. No respiratory distress.  GI: Soft.  Musculoskeletal: Normal range of motion.  Neurological: Coordination abnormal.  Skin: Skin is warm and dry.  Psychiatric: Her speech is normal. Her affect is not labile and not inappropriate. She is not agitated and not actively hallucinating. Thought content is not delusional. She does not express impulsivity. She expresses no homicidal and no suicidal ideation. She exhibits abnormal recent memory and abnormal remote memory. She is attentive.    Review of Systems  Unable to perform ROS: Medical condition  Constitutional: Negative.   HENT: Negative.   Eyes: Negative.   Respiratory: Negative.   Cardiovascular: Negative.   Gastrointestinal: Negative.   Musculoskeletal: Negative.   Skin: Negative.   Neurological: Negative.   Psychiatric/Behavioral: Positive for hallucinations and memory loss. Negative for depression, substance abuse and suicidal ideas. The patient is nervous/anxious. The patient does not have insomnia.     Blood pressure (!) 92/53, pulse 93, temperature 98.1 F (36.7 C), temperature source Oral, resp. rate 18, height _0  (1.676 m), weight 122.7 kg (270 lb 9.6 oz), SpO2 (!) 82 %.Body mass index is 43.68 kg/m.  General Appearance: Disheveled  Eye Contact:  Minimal  Speech:  Garbled  Volume:  Decreased  Mood:  Dysphoric and Irritable  Affect:  Inappropriate  Thought Process:  Disorganized  Orientation:  Negative  Thought Content:  Negative  Suicidal Thoughts:  No  Homicidal Thoughts:  No  Memory:  Negative  Judgement:  Negative  Insight:  Negative  Psychomotor Activity:  Restlessness  Concentration:  Concentration: Poor  Recall:  Poor  Fund of Knowledge:  Fair  Language:  Fair  Akathisia:  No  Handed:  Right  AIMS (if indicated):      Assets:  Financial Resources/Insurance Housing  ADL's:  Impaired  Cognition:  Impaired,  Mild and Moderate  Sleep:        Treatment Plan Summary: Daily contact with patient to assess and evaluate symptoms and progress in treatment, Medication management and Plan Doing much better. Doesn't appear to be oversedated by medicine. I will continue the current medications as ordered. Patient has no complaint. Continue to follow-up. She will have outpatient treatment in place for her psychiatric follow-up at discharge.  Disposition: Patient does not meet criteria for psychiatric inpatient admission.  Alethia Berthold, MD 10/11/2016 6:28 PM

## 2016-10-11 NOTE — Progress Notes (Signed)
PT Cancellation Note  Patient Details Name: [name] MRN: [id number] DOB: June 19, 1971   Cancelled Treatment:    Reason Eval/Treat Not Completed: Medical issues which prohibited therapy (Latest BP reading 78/32 and Hgb 6.8).  Will hold PT until more medically appropriate.   Collie Siad PT, DPT 10/11/2016, 10:39 AM

## 2016-10-12 DIAGNOSIS — K529 Noninfective gastroenteritis and colitis, unspecified: Secondary | ICD-10-CM

## 2016-10-12 DIAGNOSIS — N189 Chronic kidney disease, unspecified: Secondary | ICD-10-CM

## 2016-10-12 DIAGNOSIS — S062X9D Diffuse traumatic brain injury with loss of consciousness of unspecified duration, subsequent encounter: Secondary | ICD-10-CM

## 2016-10-12 LAB — GLUCOSE, CAPILLARY
GLUCOSE-CAPILLARY: 96 mg/dL (ref 65–99)
GLUCOSE-CAPILLARY: 90 mg/dL (ref 65–99)
Glucose-Capillary: 98 mg/dL (ref 65–99)
Glucose-Capillary: 88 mg/dL (ref 65–99)
Glucose-Capillary: 106 mg/dL — ABNORMAL HIGH (ref 65–99)

## 2016-10-12 LAB — RENAL FUNCTION PANEL
ALBUMIN: 2 g/dL — AB (ref 3.5–5.0)
Anion gap: 9 (ref 5–15)
BUN: 61 mg/dL — AB (ref 6–20)
CALCIUM: 7.8 mg/dL — AB (ref 8.9–10.3)
CO2: 22 mmol/L (ref 22–32)
CREATININE: 6.68 mg/dL — AB (ref 0.44–1.00)
Chloride: 104 mmol/L (ref 101–111)
GFR calc Af Amer: 8 mL/min — ABNORMAL LOW (ref >60)
GFR calc non Af Amer: 7 mL/min — ABNORMAL LOW (ref >60)
GLUCOSE: 92 mg/dL (ref 65–99)
PHOSPHORUS: 5.3 mg/dL — AB (ref 2.5–4.6)
Potassium: 3.6 mmol/L (ref 3.5–5.1)
SODIUM: 135 mmol/L (ref 135–145)

## 2016-10-12 LAB — CBC WITH DIFFERENTIAL/PLATELET
BASOS PCT: 1 %
Basophils Absolute: 0.1 10*3/uL (ref 0–0.1)
Eosinophils Absolute: 0.1 10*3/uL (ref 0–0.7)
Eosinophils Relative: 1 %
HCT: 20.3 % — ABNORMAL LOW (ref 35.0–47.0)
HEMOGLOBIN: 7 g/dL — AB (ref 12.0–16.0)
Lymphocytes Relative: 35 %
Lymphs Abs: 3.6 10*3/uL (ref 1.0–3.6)
MCH: 31.8 pg (ref 26.0–34.0)
MCHC: 34.2 g/dL (ref 32.0–36.0)
MCV: 93 fL (ref 80.0–100.0)
MONOS PCT: 17 %
Monocytes Absolute: 1.8 10*3/uL — ABNORMAL HIGH (ref 0.2–0.9)
NEUTROS PCT: 46 %
Neutro Abs: 4.9 10*3/uL (ref 1.4–6.5)
PLATELETS: 433 10*3/uL (ref 150–440)
RBC: 2.19 MIL/uL — ABNORMAL LOW (ref 3.80–5.20)
RDW: 14.7 % — AB (ref 11.5–14.5)
WBC: 10.4 10*3/uL (ref 3.6–11.0)

## 2016-10-12 LAB — IRON AND TIBC
Iron: 10 ug/dL — ABNORMAL LOW (ref 28–170)
Saturation Ratios: 8 % — ABNORMAL LOW (ref 10.4–31.8)
TIBC: 128 ug/dL — ABNORMAL LOW (ref 250–450)
UIBC: 118 ug/dL

## 2016-10-12 LAB — TSH: TSH: 6.751 u[IU]/mL — ABNORMAL HIGH (ref 0.350–4.500)

## 2016-10-12 LAB — FERRITIN: FERRITIN: 490 ng/mL — AB (ref 11–307)

## 2016-10-12 MED ORDER — CEFAZOLIN IN D5W 1 GM/50ML IV SOLN
1.0000 g | INTRAVENOUS | Status: AC
  Filled 2016-10-12: qty 50

## 2016-10-12 MED ORDER — TUBERCULIN PPD 5 UNIT/0.1ML ID SOLN
5.0000 [IU] | Freq: Once | INTRADERMAL | Status: DC
  Filled 2016-10-12: qty 0.1

## 2016-10-12 NOTE — Consult Note (Signed)
[facility] Consult   Reason for Consult:  Follow-up for this 45 year old woman with schizophrenia who is recovering from sepsis. No longer in the ICU. Making significant progress. Referring Physician:  Kasa Patient Identification: [name] MRN:  [id number] Principal Diagnosis: Undifferentiated schizophrenia [facility]) Diagnosis:   Patient Active Problem List   Diagnosis Date Noted  . Scalp laceration [S01.01XA]   . Pressure injury of skin [L89.90] 10/02/2016  . Undifferentiated schizophrenia ([place]) [F20.3] 09/30/2016  . Acute renal failure (ARF) ([place]) [N17.9]   . Respiratory failure ([place]) [J96.90] 09/27/2016  . Other sign and symptom in breast [N64.59]     Total Time spent with patient: 20 minutes  Subjective:   [name] is a 45 y.o. female patient admitted with "I guess I'm okay".  HPI:  Follow-up December 13. No new complaints. Patient continues to be generally cooperative. Only slightly confused. A little bit disorganized in her speech but no agitation. Risk to Self: Is patient at risk for suicide?: No Risk to Others:   Prior Inpatient Therapy:   Prior Outpatient Therapy:    Past Medical History:  Past Medical History:  Diagnosis Date  . Abnormal mammogram, unspecified 2013   Prev. cytology,hypercellular smears without evidence of malignant cells. The cytopathologist questioned if samples truly representative. Care taken during sampling and is felt to be representative.  . Breast screening, unspecified 2013  . Broken leg    age 24  . Cellulitis   . Crohn's disease ([place]) 2013  . Depression   . Diabetes mellitus without complication (HCC)    non insulin dependent  . Early menopause   . Edema   . GERD (gastroesophageal reflux disease)   . Hiatal hernia 2013  . Hyperlipidemia   . Hypertension   . Hypothyroidism   . 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 Obesity, unspecified 2013  . Other sign and symptom in breast 2013    Right bst US,lower outer quadrant,A single 0.3x0.4x0.6cm hypoechoic mass with slightly lobulated borders with adjacent 0.3x0.4x0.5cm mass was noted. The 1st was 5cm from nipple, 2nd at 8 cm from the nipple. The previous lesion aspirate was at 3 0'clock position. These lesions are thought to account for the mammographic abnormality. Minimal interval change on [place].   [facility])   . Rib fracture    age 78  . Schizophrenia ([place])   . TBI (traumatic brain injury) ([place])   . Thyroid disease    hypothyroid  . Vitamin D deficiency     Past Surgical History:  Procedure Laterality Date  . COLONOSCOPY WITH PROPOFOL N/A 02/16/2016   Procedure: COLONOSCOPY WITH PROPOFOL;  Surgeon: Lollie Sails, MD;  Location: [facility] ENDOSCOPY;  Service: Endoscopy;  Laterality: N/A;  . COLONOSCOPY WITH PROPOFOL N/A 02/22/2016   Procedure: COLONOSCOPY WITH PROPOFOL;  Surgeon: Lollie Sails, MD;  Location: [facility] ENDOSCOPY;  Service: Endoscopy;  Laterality: N/A;  . EYE SURGERY Left 2013   cataract surgery  . 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 Cancer Mother 28    colon  . Cancer Paternal Aunt    Family Psychiatric  History: None identified Social History:  History  Alcohol Use No     History  Drug Use No    Social History   Social History  . Marital status: Single  Spouse name: N/A  . Number of children: N/A  . Years of education: N/A   Social History Main Topics  . Smoking status: Never Smoker  . Smokeless tobacco: Never Used  . Alcohol use No  . Drug use: No  . Sexual activity: Not Asked   Other Topics Concern  . None   Social History Narrative  . None   Additional Social History:    Allergies:   Allergies  Allergen Reactions  . Peanut Oil Other (See Comments)    Face turns red  . Risperidone And Related Cough    Labs:  Results for orders placed or performed during the  hospital encounter of 09/27/16 (from the past 48 hour(s))  Glucose, capillary     Status: Abnormal   Collection Time: 10/10/16  8:41 PM  Result Value Ref Range   Glucose-Capillary 100 (H) 65 - 99 mg/dL  Glucose, capillary     Status: None   Collection Time: 10/11/16 12:59 AM  Result Value Ref Range   Glucose-Capillary 98 65 - 99 mg/dL  Glucose, capillary     Status: Abnormal   Collection Time: 10/11/16  5:01 AM  Result Value Ref Range   Glucose-Capillary 100 (H) 65 - 99 mg/dL  Glucose, capillary     Status: None   Collection Time: 10/11/16  7:24 AM  Result Value Ref Range   Glucose-Capillary 92 65 - 99 mg/dL  Glucose, capillary     Status: Abnormal   Collection Time: 10/11/16 11:23 AM  Result Value Ref Range   Glucose-Capillary 126 (H) 65 - 99 mg/dL  Glucose, capillary     Status: Abnormal   Collection Time: 10/11/16  4:26 PM  Result Value Ref Range   Glucose-Capillary 106 (H) 65 - 99 mg/dL  Glucose, capillary     Status: Abnormal   Collection Time: 10/11/16  8:08 PM  Result Value Ref Range   Glucose-Capillary 118 (H) 65 - 99 mg/dL  Glucose, capillary     Status: None   Collection Time: 10/11/16 11:57 PM  Result Value Ref Range   Glucose-Capillary 91 65 - 99 mg/dL  TSH     Status: Abnormal   Collection Time: 10/12/16  4:40 AM  Result Value Ref Range   TSH 6.751 (H) 0.350 - 4.500 uIU/mL    Comment: Performed by a 3rd Generation assay with a functional sensitivity of <=0.01 uIU/mL.  CBC with Differential/Platelet     Status: Abnormal   Collection Time: 10/12/16  4:40 AM  Result Value Ref Range   WBC 10.4 3.6 - 11.0 K/uL   RBC 2.19 (L) 3.80 - 5.20 MIL/uL   Hemoglobin 7.0 (L) 12.0 - 16.0 g/dL   HCT 20.3 (L) 35.0 - 47.0 %   MCV 93.0 80.0 - 100.0 fL   MCH 31.8 26.0 - 34.0 pg   MCHC 34.2 32.0 - 36.0 g/dL   RDW 14.7 (H) 11.5 - 14.5 %   Platelets 433 150 - 440 K/uL   Neutrophils Relative % 46 %   Neutro Abs 4.9 1.4 - 6.5 K/uL   Lymphocytes Relative 35 %   Lymphs Abs 3.6  1.0 - 3.6 K/uL   Monocytes Relative 17 %   Monocytes Absolute 1.8 (H) 0.2 - 0.9 K/uL   Eosinophils Relative 1 %   Eosinophils Absolute 0.1 0 - 0.7 K/uL   Basophils Relative 1 %   Basophils Absolute 0.1 0 - 0.1 K/uL  Renal function panel     Status: Abnormal  Collection Time: 10/12/16  4:40 AM  Result Value Ref Range   Sodium 135 135 - 145 mmol/L   Potassium 3.6 3.5 - 5.1 mmol/L   Chloride 104 101 - 111 mmol/L   CO2 22 22 - 32 mmol/L   Glucose, Bld 92 65 - 99 mg/dL   BUN 61 (H) 6 - 20 mg/dL   Creatinine, Ser 6.68 (H) 0.44 - 1.00 mg/dL   Calcium 7.8 (L) 8.9 - 10.3 mg/dL   Phosphorus 5.3 (H) 2.5 - 4.6 mg/dL   Albumin 2.0 (L) 3.5 - 5.0 g/dL   GFR calc non Af Amer 7 (L) >60 mL/min   GFR calc Af Amer 8 (L) >60 mL/min    Comment: (NOTE) The eGFR has been calculated using the CKD EPI equation. This calculation has not been validated in all clinical situations. eGFR's persistently <60 mL/min signify possible Chronic Kidney Disease.    Anion gap 9 5 - 15  Iron and TIBC     Status: Abnormal   Collection Time: 10/12/16  4:40 AM  Result Value Ref Range   Iron 10 (L) 28 - 170 ug/dL   TIBC 128 (L) 250 - 450 ug/dL   Saturation Ratios 8 (L) 10.4 - 31.8 %   UIBC 118 ug/dL  Ferritin     Status: Abnormal   Collection Time: 10/12/16  4:40 AM  Result Value Ref Range   Ferritin 490 (H) 11 - 307 ng/mL  Glucose, capillary     Status: None   Collection Time: 10/12/16  5:49 AM  Result Value Ref Range   Glucose-Capillary 98 65 - 99 mg/dL  Glucose, capillary     Status: None   Collection Time: 10/12/16  7:20 AM  Result Value Ref Range   Glucose-Capillary 88 65 - 99 mg/dL   Comment 1 Notify RN    Comment 2 Document in Chart   Glucose, capillary     Status: Abnormal   Collection Time: 10/12/16 11:17 AM  Result Value Ref Range   Glucose-Capillary 106 (H) 65 - 99 mg/dL   Comment 1 Notify RN    Comment 2 Document in Chart   Glucose, capillary     Status: None   Collection Time: 10/12/16  4:07  PM  Result Value Ref Range   Glucose-Capillary 96 65 - 99 mg/dL   Comment 1 Notify RN    Comment 2 Document in Chart     Current Facility-Administered Medications  Medication Dose Route Frequency Provider Last Rate Last Dose  . 0.9 %  sodium chloride infusion  250 mL Intravenous PRN Flora Lipps, MD 20 mL/hr at 10/06/16 1600 250 mL at 10/06/16 1600  . acetaminophen (TYLENOL) tablet 650 mg  650 mg Oral Q4H PRN Flora Lipps, MD   650 mg at 10/08/16 1044  . bacitracin ointment   Topical BID Jules Husbands, MD      . chlorhexidine (PERIDEX) 0.12 % solution 15 mL  15 mL Mouth Rinse BID Flora Lipps, MD   15 mL at 10/11/16 2122  . cholestyramine light (PREVALITE) packet 4 g  4 g Oral BID Theodoro Grist, MD   4 g at 10/12/16 0915  . citalopram (CELEXA) tablet 40 mg  40 mg Oral Daily Gonzella Lex, MD   40 mg at 10/12/16 0914  . cloZAPine (CLOZARIL) tablet 200 mg  200 mg Oral Daily Gonzella Lex, MD   200 mg at 10/12/16 0915  . epoetin alfa (EPOGEN,PROCRIT) injection 10,000 Units  10,000 Units  Intravenous Once Lavonia Dana, MD      . famotidine (PEPCID) tablet 20 mg  20 mg Oral Daily Lenis Noon, RPH   20 mg at 10/12/16 5364  . feeding supplement (ENSURE ENLIVE) (ENSURE ENLIVE) liquid 237 mL  237 mL Oral BID BM Demetrios Loll, MD   237 mL at 10/11/16 1229  . feeding supplement (NEPRO CARB STEADY) liquid 237 mL  237 mL Oral TID BM Theodoro Grist, MD   237 mL at 10/11/16 1229  . haloperidol lactate (HALDOL) injection 5 mg  5 mg Intravenous Once Harrie Foreman, MD      . heparin injection 5,000 Units  5,000 Units Subcutaneous Q8H Flora Lipps, MD   5,000 Units at 10/12/16 1416  . LORazepam (ATIVAN) tablet 2 mg  2 mg Oral Q6H PRN Laverle Hobby, MD   2 mg at 10/09/16 2317  . MEDLINE mouth rinse  15 mL Mouth Rinse q12n4p Flora Lipps, MD   15 mL at 10/11/16 1638  . mesalamine (PENTASA) CR capsule 500 mg  500 mg Oral TID Theodoro Grist, MD   500 mg at 10/12/16 1706  . OLANZapine zydis (ZYPREXA)  disintegrating tablet 10 mg  10 mg Oral BID WC Gonzella Lex, MD   10 mg at 10/12/16 1228  . ondansetron (ZOFRAN) injection 4 mg  4 mg Intravenous Q6H PRN Flora Lipps, MD      . sodium chloride flush (NS) 0.9 % injection 10-40 mL  10-40 mL Intracatheter Q12H Flora Lipps, MD   10 mL at 10/12/16 0916  . tuberculin injection 5 Units  5 Units Intradermal Once Murlean Iba, MD        Musculoskeletal: Strength & Muscle Tone: decreased Gait & Station: unable to stand Patient leans: N/A  Psychiatric Specialty Exam: Physical Exam  Nursing note and vitals reviewed. Constitutional: She appears well-developed and well-nourished. No distress.  HENT:  Head: Normocephalic and atraumatic.  Eyes: Conjunctivae are normal. Pupils are equal, round, and reactive to light.  Neck: Normal range of motion.  Cardiovascular: Regular rhythm and normal heart sounds.   Respiratory: Effort normal. No respiratory distress.  GI: Soft.  Musculoskeletal: Normal range of motion.  Neurological: Coordination normal.  Skin: Skin is warm and dry.  Psychiatric: Her speech is normal. Her affect is not labile and not inappropriate. She is not agitated and not actively hallucinating. Thought content is not delusional. She does not express impulsivity. She expresses no homicidal and no suicidal ideation. She exhibits abnormal recent memory and abnormal remote memory. She is attentive.    Review of Systems  Unable to perform ROS: Medical condition  Constitutional: Negative.   HENT: Negative.   Eyes: Negative.   Respiratory: Negative.   Cardiovascular: Negative.   Gastrointestinal: Negative.   Musculoskeletal: Negative.   Skin: Negative.   Neurological: Negative.   Psychiatric/Behavioral: Positive for hallucinations and memory loss. Negative for depression, substance abuse and suicidal ideas. The patient is nervous/anxious. The patient does not have insomnia.     Blood pressure (!) 107/48, pulse 96, temperature 98.4 F  (36.9 C), temperature source Oral, resp. rate 18, height 5' 6"  (1.676 m), weight 121.8 kg (268 lb 9.6 oz), SpO2 92 %.Body mass index is 43.35 kg/m.  General Appearance: Disheveled  Eye Contact:  Minimal  Speech:  Garbled  Volume:  Decreased  Mood:  Dysphoric and Irritable  Affect:  Inappropriate  Thought Process:  Disorganized  Orientation:  Negative  Thought Content:  Negative  Suicidal Thoughts:  No  Homicidal Thoughts:  No  Memory:  Negative  Judgement:  Negative  Insight:  Negative  Psychomotor Activity:  Restlessness  Concentration:  Concentration: Poor  Recall:  Poor  Fund of Knowledge:  Fair  Language:  Fair  Akathisia:  No  Handed:  Right  AIMS (if indicated):     Assets:  Financial Resources/Insurance Housing  ADL's:  Impaired  Cognition:  Impaired,  Mild and Moderate  Sleep:        Treatment Plan Summary: Daily contact with patient to assess and evaluate symptoms and progress in treatment, Medication management and Plan Continues to be doing quite well. No new mental health problems. Tolerating medicine well. No change to medication. I suspect that the patient will be able to return to her usual outpatient mental health treatment without any change to medicine. Supportive counseling and review plan with patient who has no complaints.  Disposition: Patient does not meet criteria for psychiatric inpatient admission.  Alethia Berthold, MD 10/12/2016 6:05 PM

## 2016-10-12 NOTE — Plan of Care (Signed)
Problem: Bowel/Gastric: Goal: Will not experience complications related to bowel motility Outcome: Progressing Patient continue to have diarrhea, able to get to [facility] most times. Past few episodes are starting to have more solid particles.

## 2016-10-12 NOTE — Progress Notes (Signed)
Dr. Candiss Norse rounding now. Aware of Hgb, no new orders. Patient will likely get permcath tomorrow and start dialysis again.

## 2016-10-12 NOTE — Consult Note (Signed)
[facility] Consult Note  MRN : [id number]  [name] is a 45 y.o. ([birth year]) female who presents with chief complaint of renal failure.  History of Present Illness:  Information gathered for consult from chart and nurse as patient is not willing to speak this evening. Consulted by nephrology, Dr. Candiss Norse to place permcath.   Patient is a 45 year old female with a PMHx of crohn's disease, depression, diabetes mellitus type 2, GERD, hiatal hernia, hyperlipidemia, hypertension, hypothyroidism, obesity, schizophrenia, traumatic brain injury, vitamin D deficiency, who was admitted to [facility] on 09/27/2016 for evaluation of acute respiratory failure. Initially, admitted through the ED  unresponsive. She resides at a group home. Her healthcare power of attorney [name] who resides in [place]. Consent for dialysis catheter placement is in chart. Treated in ICU with vent and pressor support. Patient is more stable now and has been transferred out of unit however is in need of permcath for dialysis in future.   Current Facility-Administered Medications  Medication Dose Route Frequency Provider Last Rate Last Dose  . 0.9 %  sodium chloride infusion  250 mL Intravenous PRN Flora Lipps, MD 20 mL/hr at 10/06/16 1600 250 mL at 10/06/16 1600  . acetaminophen (TYLENOL) tablet 650 mg  650 mg Oral Q4H PRN Flora Lipps, MD   650 mg at 10/08/16 1044  . bacitracin ointment   Topical BID Jules Husbands, MD      . chlorhexidine (PERIDEX) 0.12 % solution 15 mL  15 mL Mouth Rinse BID Flora Lipps, MD   15 mL at 10/11/16 2122  . cholestyramine light (PREVALITE) packet 4 g  4 g Oral BID Theodoro Grist, MD   4 g at 10/12/16 0915  . citalopram (CELEXA) tablet 40 mg  40 mg Oral Daily Gonzella Lex, MD   40 mg at 10/12/16 0914  . cloZAPine (CLOZARIL) tablet 200 mg  200 mg Oral Daily Gonzella Lex, MD   200 mg at 10/12/16 0915  . epoetin alfa (EPOGEN,PROCRIT) injection 10,000 Units   10,000 Units Intravenous Once Lavonia Dana, MD      . famotidine (PEPCID) tablet 20 mg  20 mg Oral Daily Lenis Noon, RPH   20 mg at 10/12/16 [id number]  . feeding supplement (ENSURE ENLIVE) (ENSURE ENLIVE) liquid 237 mL  237 mL Oral BID BM Demetrios Loll, MD   237 mL at 10/11/16 1229  . feeding supplement (NEPRO CARB STEADY) liquid 237 mL  237 mL Oral TID BM Theodoro Grist, MD   237 mL at 10/11/16 1229  . haloperidol lactate (HALDOL) injection 5 mg  5 mg Intravenous Once Harrie Foreman, MD      . heparin injection 5,000 Units  5,000 Units Subcutaneous Q8H Flora Lipps, MD   5,000 Units at 10/12/16 1416  . LORazepam (ATIVAN) tablet 2 mg  2 mg Oral Q6H PRN Laverle Hobby, MD   2 mg at 10/09/16 2317  . MEDLINE mouth rinse  15 mL Mouth Rinse q12n4p Flora Lipps, MD   15 mL at 10/11/16 1638  . mesalamine (PENTASA) CR capsule 500 mg  500 mg Oral TID Theodoro Grist, MD   500 mg at 10/12/16 1706  . OLANZapine zydis (ZYPREXA) disintegrating tablet 10 mg  10 mg Oral BID WC Gonzella Lex, MD   10 mg at 10/12/16 1228  . ondansetron (ZOFRAN) injection 4 mg  4 mg Intravenous Q6H PRN Flora Lipps, MD      . sodium chloride  flush (NS) 0.9 % injection 10-40 mL  10-40 mL Intracatheter Q12H Flora Lipps, MD   10 mL at 10/12/16 0916  . tuberculin injection 5 Units  5 Units Intradermal Once Murlean Iba, MD        Past Medical History:  Diagnosis Date  . Abnormal mammogram, unspecified 2013   Prev. cytology,hypercellular smears without evidence of malignant cells. The cytopathologist questioned if samples truly representative. Care taken during sampling and is felt to be representative.  . Breast screening, unspecified 2013  . Broken leg    age 35  . Cellulitis   . Crohn's disease ([place]) 2013  . Depression   . Diabetes mellitus without complication (HCC)    non insulin dependent  . Early menopause   . Edema   . GERD (gastroesophageal reflux disease)   . Hiatal hernia 2013  . Hyperlipidemia   . Hypertension    . Hypothyroidism   . 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 Obesity, unspecified 2013  . Other sign and symptom in breast 2013   Right bst US,lower outer quadrant,A single 0.3x0.4x0.6cm hypoechoic mass with slightly lobulated borders with adjacent 0.3x0.4x0.5cm mass was noted. The 1st was 5cm from nipple, 2nd at 8 cm from the nipple. The previous lesion aspirate was at 3 0'clock position. These lesions are thought to account for the mammographic abnormality. Minimal interval change on [place].   [facility])   . Rib fracture    age 63  . Schizophrenia ([place])   . TBI (traumatic brain injury) ([place])   . Thyroid disease    hypothyroid  . Vitamin D deficiency    Past Surgical History:  Procedure Laterality Date  . COLONOSCOPY WITH PROPOFOL N/A 02/16/2016   Procedure: COLONOSCOPY WITH PROPOFOL;  Surgeon: Lollie Sails, MD;  Location: [facility] ENDOSCOPY;  Service: Endoscopy;  Laterality: N/A;  . COLONOSCOPY WITH PROPOFOL N/A 02/22/2016   Procedure: COLONOSCOPY WITH PROPOFOL;  Surgeon: Lollie Sails, MD;  Location: [facility] ENDOSCOPY;  Service: Endoscopy;  Laterality: N/A;  . EYE SURGERY Left 2013   cataract surgery  . TUMOR EXCISION Right    age of 89, tumor removed from [place] History  Substance Use Topics  . Smoking status: Never Smoker  . Smokeless tobacco: Never Used  . Alcohol use No   Family History Family History  Problem Relation Age of Onset  . Cancer Mother 81    colon  . Cancer Paternal Aunt   Unable to obtain as patient is unwilling to speak.   Allergies  Allergen Reactions  . Peanut Oil Other (See Comments)    Face turns red  . Risperidone And Related Cough   REVIEW OF SYSTEMS (Negative unless checked)  Unable to obtain as patient is unwilling to speak.   Constitutional: [] Weight loss  [] Fever  [] Chills Cardiac: [] Chest pain   [] Chest pressure   [] Palpitations   [] Shortness of breath when laying flat    [] Shortness of breath at rest   [] Shortness of breath with exertion. Vascular:  [] Pain in legs with walking   [] Pain in legs at rest   [] Pain in legs when laying flat   [] Claudication   [] Pain in feet when walking  [] Pain in feet at rest  [] Pain in feet when laying flat   [] History of DVT   [] Phlebitis   [] Swelling in legs   [] Varicose veins   [] Non-healing ulcers Pulmonary:   [] Uses home oxygen   []   Productive cough   [] Hemoptysis   [] Wheeze  [] COPD   [] Asthma Neurologic:  [] Dizziness  [] Blackouts   [] Seizures   [] History of stroke   [] History of TIA  [] Aphasia   [] Temporary blindness   [] Dysphagia   [] Weakness or numbness in arms   [] Weakness or numbness in legs Musculoskeletal:  [] Arthritis   [] Joint swelling   [] Joint pain   [] Low back pain Hematologic:  [] Easy bruising  [] Easy bleeding   [] Hypercoagulable state   [] Anemic  [] Hepatitis Gastrointestinal:  [] Blood in stool   [] Vomiting blood  [] Gastroesophageal reflux/heartburn   [] Difficulty swallowing. Genitourinary:  [] Chronic kidney disease   [] Difficult urination  [] Frequent urination  [] Burning with urination   [] Blood in urine Skin:  [] Rashes   [] Ulcers   [] Wounds Psychological:  [] History of anxiety   []  History of major depression.  Physical Examination  Vitals:   10/12/16 0426 10/12/16 0912 10/12/16 0930 10/12/16 1116  BP: (!) 102/53 (!) 86/42 (!) 100/53 (!) 107/48  Pulse: 95 96 93 96  Resp: 18   18  Temp: 98.4 F (36.9 C)   98.4 F (36.9 C)  TempSrc: Oral   Oral  SpO2: 91%   92%  Weight: 268 lb 9.6 oz (121.8 kg)     Height:       Body mass index is 43.35 kg/m.  Unable to obtain as patient is unwilling.   CBC Lab Results  Component Value Date   WBC 10.4 10/12/2016   HGB 7.0 (L) 10/12/2016   HCT 20.3 (L) 10/12/2016   MCV 93.0 10/12/2016   PLT 433 10/12/2016   BMET    Component Value Date/Time   NA 135 10/12/2016 0440   NA 138 11/23/2012 0943   K 3.6 10/12/2016 0440   K 3.9 11/23/2012 0943   CL 104 10/12/2016  0440   CL 99 11/23/2012 0943   CO2 22 10/12/2016 0440   CO2 33 (H) 11/23/2012 0943   GLUCOSE 92 10/12/2016 0440   GLUCOSE 138 (H) 11/23/2012 0943   BUN 61 (H) 10/12/2016 0440   BUN 14 11/23/2012 0943   CREATININE 6.68 (H) 10/12/2016 0440   CREATININE 1.16 11/23/2012 0943   CALCIUM 7.8 (L) 10/12/2016 0440   CALCIUM 9.1 11/23/2012 0943   GFRNONAA 7 (L) 10/12/2016 0440   GFRNONAA 58 (L) 11/23/2012 0943   GFRAA 8 (L) 10/12/2016 0440   GFRAA >60 11/23/2012 0943   Estimated Creatinine Clearance: 14.2 mL/min (by C-G formula based on SCr of 6.68 mg/dL (H)).  COAG Lab Results  Component Value Date   INR 1.25 09/27/2016   Radiology Ct Head Wo Contrast  Result Date: 09/27/2016 CLINICAL DATA:  Recent fall with head injury, initial encounter EXAM: CT HEAD WITHOUT CONTRAST TECHNIQUE: Contiguous axial images were obtained from the base of the skull through the vertex without intravenous contrast. COMPARISON:  None. FINDINGS: Brain: No evidence of acute infarction, hemorrhage, hydrocephalus, extra-axial collection or mass lesion/mass effect. Vascular: No hyperdense vessel or unexpected calcification. Skull: Normal. Negative for fracture or focal lesion. Sinuses/Orbits: No acute finding. Other: Scattered subcutaneous calcified lesions are noted bilaterally these have a benign appearance. IMPRESSION: No acute intracranial abnormality noted. Electronically Signed   By: Inez Catalina M.D.   On: 09/27/2016 08:25   Dg Chest Port 1 View  Result Date: 10/02/2016 CLINICAL DATA:  45 year old female with history of acute respiratory failure. EXAM: PORTABLE CHEST 1 VIEW COMPARISON:  Chest x-ray 10/01/2016. FINDINGS: There is a right-sided internal jugular central venous catheter with tip  terminating in the distal superior vena cava. Left internal jugular Vas-Cath with tip terminating in the distal superior vena cava. Lung volumes are low. Elevated right hemidiaphragm. Bibasilar opacities are favored to reflect  areas of subsegmental atelectasis. Perihilar linear densities are also most compatible with areas of subsegmental atelectasis. Possible small left pleural effusion. No evidence of pulmonary edema. Heart size is normal. Upper mediastinal contours are within normal limits. IMPRESSION: 1. Support apparatus, as above. 2. Low lung volumes with bibasilar subsegmental atelectasis and probable small left pleural effusion. Electronically Signed   By: Vinnie Langton M.D.   On: 10/02/2016 07:54   Dg Chest Port 1 View  Result Date: 10/01/2016 CLINICAL DATA:  Acute respiratory failure EXAM: PORTABLE CHEST 1 VIEW COMPARISON:  September 28, 2016 FINDINGS: A new right PICC line terminates near the caval atrial junction. The ET and NG tubes have been removed. The left central line is stable. No pneumothorax. Probable layering effusion and underlying atelectasis on the left. New right perihilar opacity may represent vascular crowding given decreased volumes on the right, atelectasis, or infiltrate. Recommend attention on follow-up. IMPRESSION: 1. Support apparatus as above. 2. Probable layering effusion and underlying atelectasis on the left, new in the interval. 3. New opacity in the right perihilar region described above. Recommend attention on follow-up. Electronically Signed   By: Dorise Bullion III M.D   On: 10/01/2016 07:54   Dg Chest Port 1 View  Result Date: 09/28/2016 CLINICAL DATA:  Central line placement EXAM: PORTABLE CHEST 1 VIEW COMPARISON:  09/28/2016 FINDINGS: Cardiomediastinal silhouette is stable. Stable endotracheal and NG tube position. Stable right IJ central line. There is new left IJ central line with tip in SVC right atrium junction. No pneumothorax. IMPRESSION: New left IJ central line with tip in SVC right atrium junction. No pneumothorax. Electronically Signed   By: Lahoma Crocker M.D.   On: 09/28/2016 14:36   Dg Chest Port 1 View  Result Date: 09/28/2016 CLINICAL DATA:  Shortness of breath,  acute onset. Subsequent encounter. EXAM: PORTABLE CHEST 1 VIEW COMPARISON:  Chest radiograph performed 09/27/2016 FINDINGS: The patient's endotracheal tube is seen ending 2-3 cm above the carina. A right IJ line is noted ending about the mid to distal SVC. An enteric tube is noted extending below the diaphragm. The lungs are hypoexpanded. Minimal bilateral atelectasis is noted. No pleural effusion or pneumothorax is seen. The cardiomediastinal silhouette is normal in size. No acute osseous abnormalities are identified. IMPRESSION: 1. Endotracheal tube seen ending 2-3 cm above the carina. 2. Right IJ line noted ending about the mid to distal SVC. 3. Lungs hypoexpanded, with minimal bilateral atelectasis. Electronically Signed   By: Garald Balding M.D.   On: 09/28/2016 05:48   Dg Chest Port 1 View  Result Date: 09/27/2016 CLINICAL DATA:  Central line placement. EXAM: PORTABLE CHEST 1 VIEW COMPARISON:  09/27/2016 at 0705 hours FINDINGS: Endotracheal tube terminates 3 cm above the carina. Right jugular catheter terminates over the mid SVC. Enteric tube courses into the left upper abdomen, more fully evaluated on separate abdominal radiograph. The cardiomediastinal silhouette is within normal limits. Lung volumes are improved with decreased atelectasis in the bases. No pleural effusion or pneumothorax is identified. IMPRESSION: Support devices as above.  Improved lung aeration. Electronically Signed   By: Logan Bores M.D.   On: 09/27/2016 11:36   Dg Chest Port 1 View  Result Date: 09/27/2016 CLINICAL DATA:  45 year old female status post intubation EXAM: PORTABLE CHEST 1 VIEW COMPARISON:  CT  07/29/2012. Note that this study is reported after the subsequent chest x-ray of the same date. FINDINGS: Endotracheal tube in position measuring approximately 4.2 cm above the carina. Overlying EKG leads. Low lung volumes with linear opacities at the lung bases. No pneumothorax. No large pleural effusion or confluent  airspace disease. IMPRESSION: Endotracheal tube terminates suitably above the carina, approximately 4.2 cm. Low lung volumes with basilar atelectasis. Signed, Dulcy Fanny. Earleen Newport, DO [facility] Radiology Electronically Signed   By: Corrie Mckusick D.O.   On: 09/27/2016 08:47   Dg Chest Portable 1 View  Result Date: 09/27/2016 CLINICAL DATA:  Central line placement EXAM: PORTABLE CHEST 1 VIEW COMPARISON:  07/27/2012 FINDINGS: Endotracheal tube is 3.4 cm above the carina. Right central line is in place with the tip at the cavoatrial junction. No pneumothorax. Very low lung volumes with bibasilar atelectasis. Heart size is borderline enlarged. No visible effusions. IMPRESSION: Support devices as above, in expected position. Very low lung volumes with bibasilar atelectasis. Electronically Signed   By: Rolm Baptise M.D.   On: 09/27/2016 07:33   Dg Abd Portable 1v  Result Date: 09/27/2016 CLINICAL DATA:  Nasogastric tube placement EXAM: PORTABLE ABDOMEN - 1 VIEW COMPARISON:  Chest from earlier the same day FINDINGS: Nasogastric has been advanced to the body of the decompressed stomach. A few gas distended bowel loops in the visualized upper and mid abdomen. The lower abdomen is excluded. No free air evident on this erect radiograph. IMPRESSION: 1. Nasogastric tube to the stomach. Electronically Signed   By: Lucrezia Europe M.D.   On: 09/27/2016 11:36   Assessment/Plan Patient is a 45 year old female with a PMHx of crohn's disease, depression, diabetes mellitus type 2, GERD, hiatal hernia, hyperlipidemia, hypertension, hypothyroidism, obesity, schizophrenia, traumatic brain injury, vitamin D deficiency, who was admitted to [facility] on 09/27/2016 for evaluation of acute respiratory failure. Initially, admitted through the ED  unresponsive. She resides at a group home. Her healthcare power of attorney [name] who resides in [place]. Consent for dialysis catheter  placement is in chart. Treated in ICU with vent and pressor support. Patient is more stable now and has been transferred out of unit however is in need of permcath for dialysis in future.  1. Acute Renal Failure: Will place permcath tomorrow. Consent in chart.  2. Severe agitation with underlying schizophrenia: Psychiatry following, now on Zyprexa and clonazepam, supportive counseling and encouragement was provided by psychiatrist.  3. Acute respiratory failure with hypoxia--improved. Extubated 12/1 off nasal cannula, now on room air with O2 sats of 98%  Dicussed with Dr. Mayme Genta, PA-C  10/12/2016 5:51 PM

## 2016-10-12 NOTE — Progress Notes (Signed)
Liquid brown stools continue.  Pt pulled out flexiseal tube X2 with bulb inflated.  Tube not reinserted on second occurrence.  Pt with small amount retention noted on bladder scan.  Up to bedside commode with assistance.  Able to void in small amount.  I & O performed with 500 ml noted.

## 2016-10-12 NOTE — Clinical Social Work Note (Signed)
MSW spoke to patient's caregiver Dewaine Oats at [facility], and presented bed offers, caregiver would like he to go to [facility].  [name]. Javae Braaten, MSW [phone]  Mon-Fri 8a-4:30p 10/12/2016 9:13 AM

## 2016-10-12 NOTE — Clinical Social Work Note (Signed)
QMHP from [facility] came to assess patient today.  MSW awaiting [facility] number, MSW updated [facility] that patient would like to go to facility once she is medically ready for discharge and orders have been received.  [name]. [name], MSW [phone]  Mon-Fri 8a-4:30p 10/12/2016 4:50 PM

## 2016-10-12 NOTE — Progress Notes (Addendum)
[facility] [place] NAME: Sumaiyah Gut    MR#:  [id number]  DATE OF BIRTH:  1971-01-30  SUBJECTIVE:  CHIEF COMPLAINT:  No chief complaint on file.  The patient has no complaints, she is more alert, answers some questions appropriately. Loose stools, rectal tube is replaced, C. difficile was tested and negative. Gastrointestinal panel is negative as well. Initiated on cholestyramine and mesalamine with no significant improvement in stool consistency. The patient was seen by gastroenterologist and was initiated on [place] since there was a concern of possible high fructose corn and milk product consumption. Patient removed. Rectal tube yesterday, her diarrheal stool is subsiding, still continues to be liquidy. She is able to void, small amount of urine, incontinent, postvoid bladder scan was around 300s to 400 cc., Now on Vesicare.  REVIEW OF SYSTEMS:  Review of Systems  Unable to perform ROS: Mental status change  Constitutional: Negative for chills, fever and weight loss.  HENT: Negative for congestion.   Eyes: Negative for blurred vision and double vision.  Respiratory: Negative for cough, sputum production, shortness of breath and wheezing.   Cardiovascular: Negative for chest pain, palpitations, orthopnea, leg swelling and PND.  Gastrointestinal: Positive for diarrhea. Negative for abdominal pain, blood in stool, constipation, nausea and vomiting.  Genitourinary: Negative for dysuria, frequency, hematuria and urgency.  Musculoskeletal: Negative for falls.  Neurological: Negative for dizziness, tremors, focal weakness and headaches.  Endo/Heme/Allergies: Does not bruise/bleed easily.  Psychiatric/Behavioral: Negative for depression. The patient does not have insomnia.   Patient is confused, although knows that she is in the hospital  DRUG ALLERGIES:   Allergies  Allergen Reactions  . Peanut Oil Other (See Comments)    Face turns red    . Risperidone And Related Cough   VITALS:  Blood pressure (!) 107/48, pulse 96, temperature 98.4 F (36.9 C), temperature source Oral, resp. rate 18, height 5\' 6"  (1.676 m), weight 121.8 kg (268 lb 9.6 oz), SpO2 92 %. PHYSICAL EXAMINATION:  Physical Exam  Constitutional: She is well-developed, well-nourished, and in no distress.  Morbid obesity.  HENT:  Head: Normocephalic.  Mouth/Throat: Oropharynx is clear and moist.  Eyes: Conjunctivae and EOM are normal.  Neck: Normal range of motion. Neck supple. No JVD present. No tracheal deviation present.  Cardiovascular: Normal rate, regular rhythm and normal heart sounds.   Pulmonary/Chest: Effort normal and breath sounds normal. No respiratory distress. She has no wheezes. She has no rales.  Abdominal: Soft. Bowel sounds are normal. She exhibits no distension. There is no tenderness.  Musculoskeletal: She exhibits no edema or tenderness.  Neurological: She is alert. No cranial nerve deficit.  Confused but follow commands.  Skin: No rash noted. No erythema.   LABORATORY PANEL:   CBC  Recent Labs Lab 10/12/16 0440  WBC 10.4  HGB 7.0*  HCT 20.3*  PLT 433   ------------------------------------------------------------------------------------------------------------------ Chemistries   Recent Labs Lab 10/06/16 0457  10/12/16 0440  NA 147*  < > 135  K 3.3*  < > 3.6  CL 115*  < > 104  CO2 25  < > 22  GLUCOSE 118*  < > 92  BUN 30*  < > 61*  CREATININE 3.28*  < > 6.68*  CALCIUM 8.1*  < > 7.8*  MG 1.8  --   --   < > = values in this interval not displayed. RADIOLOGY:  No results found. ASSESSMENT AND PLAN:   45 yo obese white  female admitted to the ICU for septic shock?UTI with severe metabolic acidosis, acute encephalopathy and acute respiratory failure;  Status post extubation. ARF on CRRT. All cultures are negative so far.  Acute respiratory failure with hypoxia--improved. Extubated 12/1 off nasal cannula, now on room  air with O2 sats of 98%  Severe septic Versus hypovolemic shock, blood pressure is low, Off vasopressor. She has been treated with Zosyn for 10 days, now off antibiotics. ECHO with normal LVEF of approximately 60% Discontinue Solu-Cortef.The patient continues to have diarrhea.  May need to initiate on IV fluids.   AKI possibly related to severe sepsis s/p  CRRT, also started on albumin by nephrology. PermCath will be placed as patient is in need for dialysis, as creatinine is critically high, although patient continues to have urinary output, patient's urinary output was 1400 cc over the past 24 hours.  The  patient continues to lose lots of fluids with diarrheal stool. Follow closely and initiate IV fluids if needed  Hyperkalemia-resolved Hypocalcemia, likely alimental Hypophosphatemia, resolved   Leukocytosis, likely due to steroids, resolved  Acute encephalopathy, delirium related to severe sepsis, improved .  Severe agitation with underlying schizophrenia. -Psychiatry following, now on Zyprexa and clonazepam, supportive counseling and encouragement was provided by psychiatrist. Patient seemed to be responding to intervention, she is calmer and more comfortable.    Diarrhea, C. difficile negative, gastrointestinal panel is negative, she was on  mesalamine at home, being continued on mesalamine at present, continue cholestyramine, appreciate gastroenterologist  recommendations, now on BRAT diet, so that is not as frequent as before, although remains liquidy must continue supportive therapy, may need endoscopy/colonoscopy  Anemia, no obvious bleeding noted, patient may need to be transfused with hemodialysis tomorrow, recheck hemoglobin level tomorrow morning, check iron studies, ferritin level   All the records are reviewed and case discussed with Care Management/Social Worker. Management plans discussed with the patient's aunt, and they are in agreement.  CODE STATUS: Full  code  TOTAL TIME TAKING CARE OF THIS PATIENT: 35 minutes.   More than 50% of the time was spent in counseling/coordination of care: YES  POSSIBLE D/C IN >3 DAYS, DEPENDING ON CLINICAL CONDITION.   Theodoro Grist M.D on 10/12/2016 at 4:15 PM  Between 7am to 6pm - Pager - [phone]  After 6pm go to [email or web address] - Proofreader  [facility]  Office  [phone]  CC: Primary care physician; Lorelee Market, MD  Note: This dictation was prepared with Dragon dictation along with smaller phrase technology. Any transcriptional errors that result from this process are unintentional.

## 2016-10-12 NOTE — Progress Notes (Signed)
Kiara Bellows MD [address] [place], [place] [address] Phone: [phone] Fax : [phone]  [name] is being followed for diarrhea  Day 2 of follow up   Subjective: Pulled out rectal tube last night   2 bowel movements overnight    Objective: Vital signs in last 24 hours: Vitals:   10/11/16 0924 10/11/16 1126 10/11/16 2006 10/12/16 0426  BP:  (!) 92/53 (!) 95/51 (!) 102/53  Pulse: 99 93 97 95  Resp:  18 18 18   Temp:  98.1 F (36.7 C) 98.1 F (36.7 C) 98.4 F (36.9 C)  TempSrc:  Oral Oral Oral  SpO2: 93% (!) 82% 96% 91%  Weight:    268 lb 9.6 oz (121.8 kg)  Height:       Weight change: -2 lb (-0.907 kg)  Intake/Output Summary (Last 24 hours) at 10/12/16 0849 Last data filed at 10/12/16 0630  Gross per 24 hour  Intake             1300 ml  Output             1400 ml  Net             -100 ml     Exam: Heart:: Regular rate and rhythm Lungs: normal Abdomen: soft, nontender, normal bowel sounds   Lab Results: @LABTEST2 @ Micro Results: Recent Results (from the past 240 hour(s))  C difficile quick scan w PCR reflex     Status: None   Collection Time: 10/04/16 12:20 AM  Result Value Ref Range Status   C Diff antigen NEGATIVE NEGATIVE Final   C Diff toxin NEGATIVE NEGATIVE Final   C Diff interpretation No C. difficile detected.  Final  Gastrointestinal Panel by PCR , Stool     Status: None   Collection Time: 10/08/16  7:00 PM  Result Value Ref Range Status   Campylobacter species NOT DETECTED NOT DETECTED Final   Plesimonas shigelloides NOT DETECTED NOT DETECTED Final   Salmonella species NOT DETECTED NOT DETECTED Final   Yersinia enterocolitica NOT DETECTED NOT DETECTED Final   Vibrio species NOT DETECTED NOT DETECTED Final   Vibrio cholerae NOT DETECTED NOT DETECTED Final   Enteroaggregative E coli (EAEC) NOT DETECTED NOT DETECTED Final   Enteropathogenic E coli (EPEC) NOT DETECTED NOT DETECTED Final   Enterotoxigenic E coli (ETEC) NOT  DETECTED NOT DETECTED Final   Shiga like toxin producing E coli (STEC) NOT DETECTED NOT DETECTED Final   Shigella/Enteroinvasive E coli (EIEC) NOT DETECTED NOT DETECTED Final   Cryptosporidium NOT DETECTED NOT DETECTED Final   Cyclospora cayetanensis NOT DETECTED NOT DETECTED Final   Entamoeba histolytica NOT DETECTED NOT DETECTED Final   Giardia lamblia NOT DETECTED NOT DETECTED Final   Adenovirus F40/41 NOT DETECTED NOT DETECTED Final   Astrovirus NOT DETECTED NOT DETECTED Final   Norovirus GI/GII NOT DETECTED NOT DETECTED Final   Rotavirus A NOT DETECTED NOT DETECTED Final   Sapovirus (I, II, IV, and V) NOT DETECTED NOT DETECTED Final  I have reviewed the patient's current medications. Studies/Results: No results found. Medications:  Scheduled Meds: . bacitracin   Topical BID  . chlorhexidine  15 mL Mouth Rinse BID  . cholestyramine light  4 g Oral BID  . citalopram  40 mg Oral Daily  . cloZAPine  200 mg Oral Daily  . epoetin (EPOGEN/PROCRIT) injection  10,000 Units Intravenous Once  . famotidine  20 mg Oral Daily  . feeding supplement (ENSURE ENLIVE)  237  mL Oral BID BM  . feeding supplement (NEPRO CARB STEADY)  237 mL Oral TID BM  . haloperidol lactate  5 mg Intravenous Once  . heparin  5,000 Units Subcutaneous Q8H  . hydrocortisone sod succinate (SOLU-CORTEF) inj  25 mg Intravenous Daily  . mouth rinse  15 mL Mouth Rinse q12n4p  . mesalamine  500 mg Oral TID  . OLANZapine zydis  10 mg Oral BID WC  . sodium chloride flush  10-40 mL Intracatheter Q12H  . sodium chloride flush  3 mL Intravenous Q12H   Continuous Infusions: PRN Meds:.sodium chloride, sodium chloride, acetaminophen, LORazepam, ondansetron (ZOFRAN) IV   Assessment: Principal Problem:   Undifferentiated schizophrenia ([place]) Active Problems:   Respiratory failure ([place])   Acute renal failure (ARF) (HCC)   Pressure injury of skin   Scalp laceration  [name] is a 45 y.o. y/o female with admission for  sepsis, respiratory failure, off pressors and extubated in the ICU. We have been consulted for acute onset large volume diarrhea. Infectious causes has been ruled out with stools tests. Differentials include possible mesenteric ischemia from hypotension which should improve with conservative management vs an osmotic diarrhea from oral diet which seems to be very rich high fructose corn from the ginger ale and lactose from the milk products she is consuming  or microscopic colitis.   Plan:  Today at breakfast on her table when I walked in she had 1 can of milk, 1 cup of vanilla ice cream and a vanilla shake . There was bacon, scrambled eggs on her plate.   I discussed with the nurse in charge that I had put in a BRAT diet last evening which she confirmed and will get in touch to have the issue resolved.   Suggest strict BRAT diet for atleast 24 hours- if diarrhea slows down/resolves can advance diet , if persists will need endoscopy and biopsies.     LOS: 15 days   [name] 10/12/2016, 8:49 AM

## 2016-10-12 NOTE — Progress Notes (Signed)
Nutrition Follow-up  DOCUMENTATION CODES:   Morbid obesity  INTERVENTION:  1. BRAT diet per GI - fixed in healthtouch for patient. 2. Hold Ensure, NePro for now.  NUTRITION DIAGNOSIS:   Inadequate oral intake related to dysphagia, acute illness as evidenced by meal completion < 50%. -ongoing  GOAL:   Patient will meet greater than or equal to 90% of their needs -not meeting  MONITOR:   PO intake, Supplement acceptance  REASON FOR ASSESSMENT:   Ventilator    ASSESSMENT:   45 y.o.white female with a PMHx of Crohn's disease, depression, diabetes mellitus type 2, GERD, hiatal hernia, hyperlipidemia, hypertension, hypothyroidism, obesity, schizophrenia, traumatic brain injury, vitamin D deficiency, who was admitted to [facility] on 09/27/2016 with septic shock secondary to UTI, severe metabolic acidosis, acute encephalopathy and acute respiratory failure  Pt continues to have diarrhea, removed rectal tube twice. Did not receive appropriate tray for BRAT diet yesterday and this morning.  During my visit she had appropriate tray - had consumed everything but rice.  Will monitor for diarrhea improvement, advancement of diet, utilization of ONS to meet protein needs. No dialysis today per Dr. Candiss Norse - pt pulled out HD Cath Labs and medications reviewed: EPO  Diet Order:  Diet regular Room service appropriate? Yes; Fluid consistency: Thin  Skin:  Wound (see comment) (head laceration )  Last BM:  12/8  Height:   Ht Readings from Last 1 Encounters:  09/27/16 5\' 6"  (1.676 m)    Weight:   Wt Readings from Last 1 Encounters:  10/12/16 268 lb 9.6 oz (121.8 kg)    Ideal Body Weight:  61.3 kg  BMI:  Body mass index is 43.35 kg/m.  Estimated Nutritional Needs:   Kcal:  2200-2500 kcals  Protein:  >/= 120 g  Fluid:  >/= 1.7 L  EDUCATION NEEDS:   Education needs no appropriate at this time  Satira Anis. Shandricka Monroy, MS, RD LDN Inpatient Clinical Dietitian Pager [phone]

## 2016-10-12 NOTE — Care Management (Addendum)
Appears patient's urine output for past 24 hours 1400 cc.  Creatinine up to 6.68. Will continue to anticipate need for outpatient dialysis.  Updated [facility].  Patient will require a perm cath. Has refused rectal tube again.  Continues with diarrhea now thought to be due to crohns

## 2016-10-12 NOTE — Progress Notes (Signed)
Unable to place permcath today because patient has eaten and will need to wait until tomorrow so she can have some sedation for procedures per Caralee Ates,  Specials RN.

## 2016-10-12 NOTE — Progress Notes (Signed)
Dr. Ether Griffins rounding now. Per MD, patient can have gatorade. D/c tele, any med-surg and remove central line - MD aware that 2 lines do not flush, patient has peripheral IV that works well. Order to change CBG checks to ACHS. MD aware of bladder scan 380, patient does not feel any discomfort or the urge to void - per MD, recheck in 2 hours and I&O cath if >499.

## 2016-10-12 NOTE — Progress Notes (Signed)
[facility]  ROUNDING NOTE   Subjective:   Patient is overall doing fair Appropriate diet is being addressed for her condition Serum creatinine high again today at 6.68, BUN 61, GFR 7 Phosphorus high at 5.3 and albumin is low at 2.0 Hemoglobin also remains low at 7.0    Objective:  Vital signs in last 24 hours:  Temp:  [98.1 F (36.7 C)-98.4 F (36.9 C)] 98.4 F (36.9 C) (12/13 1116) Pulse Rate:  [93-97] 96 (12/13 1116) Resp:  [18] 18 (12/13 1116) BP: (86-107)/(42-53) 107/48 (12/13 1116) SpO2:  [91 %-96 %] 92 % (12/13 1116) Weight:  [121.8 kg (268 lb 9.6 oz)] 121.8 kg (268 lb 9.6 oz) (12/13 0426)  Weight change: -0.907 kg (-2 lb) Filed Weights   10/10/16 0627 10/11/16 0315 10/12/16 0426  Weight: 123.6 kg (272 lb 6.4 oz) 122.7 kg (270 lb 9.6 oz) 121.8 kg (268 lb 9.6 oz)    Intake/Output: I/O last 3 completed shifts: In: 1853 [P.O.:840; I.V.:13; Other:1000] Out: 2400 [Urine:1400; Stool:1000]   Intake/Output this shift:  Total I/O In: 240 [P.O.:240] Out: 275 [Urine:275]  Physical Exam: General: No acute distress  Head: OM moist  Eyes: Anicteric  Neck: Supple, trachea midline  Lungs:  clear, normal effort   Heart: S1S2 No rubs   Abdomen:  Soft, non tender  Extremities: Trace peripheral edema.  Neurologic: Alert, able to answer questions  Skin: No lesions  Access:   Foley  Basic Metabolic Panel:  Recent Labs Lab 10/06/16 0457 10/07/16 0434 10/08/16 0545 10/09/16 0513 10/10/16 0625 10/12/16 0440  NA 147* 149* 142 136 137 135  K 3.3* 3.1* 3.3* 3.2* 3.2* 3.6  CL 115* 116* 105 102 103 104  CO2 25 24 30 28 26 22   GLUCOSE 118* 107* 92 99 93 92  BUN 30* 37* 21* 32* 42* 61*  CREATININE 3.28* 4.19* 3.44* 4.71* 5.60* 6.68*  CALCIUM 8.1* 8.0* 7.6* 7.5* 7.6* 7.8*  MG 1.8  --   --   --   --   --   PHOS 4.2 4.3  --  4.5 4.4 5.3*    Liver Function Tests:  Recent Labs Lab 10/06/16 0457 10/07/16 0434 10/09/16 0513 10/10/16 0625 10/12/16 0440   ALBUMIN 2.5* 2.3* 1.9* 1.9* 2.0*   No results for input(s): LIPASE, AMYLASE in the last 168 hours. No results for input(s): AMMONIA in the last 168 hours.  CBC:  Recent Labs Lab 10/06/16 0457 10/07/16 0434 10/09/16 0513 10/10/16 0625 10/12/16 0440  WBC 24.2* 17.2* 13.9* 13.1* 10.4  NEUTROABS  --  11.0*  --   --  4.9  HGB 8.9* 7.8* 7.1* 6.8* 7.0*  HCT 27.2* 23.6* 21.3* 20.8* 20.3*  MCV 92.9 93.4 93.4 93.5 93.0  PLT 235 239 313 349 433    Cardiac Enzymes: No results for input(s): CKTOTAL, CKMB, CKMBINDEX, TROPONINI in the last 168 hours.  BNP: Invalid input(s): POCBNP  CBG:  Recent Labs Lab 10/11/16 2008 10/11/16 2357 10/12/16 0549 10/12/16 0720 10/12/16 1117  GLUCAP 118* 91 98 88 106*    Microbiology: Results for orders placed or performed during the hospital encounter of 09/27/16  Blood Culture (routine x 2)     Status: None   Collection Time: 09/27/16  6:02 AM  Result Value Ref Range Status   Specimen Description BLOOD  R AC  Final   Special Requests   Final    BOTTLES DRAWN AEROBIC AND ANAEROBIC  AER 3 ML ANA 4 ML   Culture NO  GROWTH 5 DAYS  Final   Report Status 10/02/2016 FINAL  Final  Blood Culture (routine x 2)     Status: None   Collection Time: 09/27/16  6:03 AM  Result Value Ref Range Status   Specimen Description BLOOD  R ARM  Final   Special Requests   Final    BOTTLES DRAWN AEROBIC AND ANAEROBIC  AER 6 ML ANA 10 ML   Culture NO GROWTH 5 DAYS  Final   Report Status 10/02/2016 FINAL  Final  Urine culture     Status: None   Collection Time: 09/27/16  9:30 AM  Result Value Ref Range Status   Specimen Description URINE, RANDOM  Final   Special Requests NONE  Final   Culture NO GROWTH Performed at [facility]   Final   Report Status 09/28/2016 FINAL  Final  MRSA PCR Screening     Status: None   Collection Time: 09/27/16 11:09 AM  Result Value Ref Range Status   MRSA by PCR NEGATIVE NEGATIVE Final    Comment:        The GeneXpert  MRSA Assay (FDA approved for NASAL specimens only), is one component of a comprehensive MRSA colonization surveillance program. It is not intended to diagnose MRSA infection nor to guide or monitor treatment for MRSA infections.   C difficile quick scan w PCR reflex     Status: None   Collection Time: 10/04/16 12:20 AM  Result Value Ref Range Status   C Diff antigen NEGATIVE NEGATIVE Final   C Diff toxin NEGATIVE NEGATIVE Final   C Diff interpretation No C. difficile detected.  Final  Gastrointestinal Panel by PCR , Stool     Status: None   Collection Time: 10/08/16  7:00 PM  Result Value Ref Range Status   Campylobacter species NOT DETECTED NOT DETECTED Final   Plesimonas shigelloides NOT DETECTED NOT DETECTED Final   Salmonella species NOT DETECTED NOT DETECTED Final   Yersinia enterocolitica NOT DETECTED NOT DETECTED Final   Vibrio species NOT DETECTED NOT DETECTED Final   Vibrio cholerae NOT DETECTED NOT DETECTED Final   Enteroaggregative E coli (EAEC) NOT DETECTED NOT DETECTED Final   Enteropathogenic E coli (EPEC) NOT DETECTED NOT DETECTED Final   Enterotoxigenic E coli (ETEC) NOT DETECTED NOT DETECTED Final   Shiga like toxin producing E coli (STEC) NOT DETECTED NOT DETECTED Final   Shigella/Enteroinvasive E coli (EIEC) NOT DETECTED NOT DETECTED Final   Cryptosporidium NOT DETECTED NOT DETECTED Final   Cyclospora cayetanensis NOT DETECTED NOT DETECTED Final   Entamoeba histolytica NOT DETECTED NOT DETECTED Final   Giardia lamblia NOT DETECTED NOT DETECTED Final   Adenovirus F40/41 NOT DETECTED NOT DETECTED Final   Astrovirus NOT DETECTED NOT DETECTED Final   Norovirus GI/GII NOT DETECTED NOT DETECTED Final   Rotavirus A NOT DETECTED NOT DETECTED Final   Sapovirus (I, II, IV, and V) NOT DETECTED NOT DETECTED Final    Coagulation Studies: No results for input(s): LABPROT, INR in the last 72 hours.  Urinalysis: No results for input(s): COLORURINE, LABSPEC, PHURINE,  GLUCOSEU, HGBUR, BILIRUBINUR, KETONESUR, PROTEINUR, UROBILINOGEN, NITRITE, LEUKOCYTESUR in the last 72 hours.  Invalid input(s): APPERANCEUR    Imaging: No results found.   Medications:    . bacitracin   Topical BID  . chlorhexidine  15 mL Mouth Rinse BID  . cholestyramine light  4 g Oral BID  . citalopram  40 mg Oral Daily  . cloZAPine  200 mg Oral Daily  .  epoetin (EPOGEN/PROCRIT) injection  10,000 Units Intravenous Once  . famotidine  20 mg Oral Daily  . feeding supplement (ENSURE ENLIVE)  237 mL Oral BID BM  . feeding supplement (NEPRO CARB STEADY)  237 mL Oral TID BM  . haloperidol lactate  5 mg Intravenous Once  . heparin  5,000 Units Subcutaneous Q8H  . hydrocortisone sod succinate (SOLU-CORTEF) inj  25 mg Intravenous Daily  . mouth rinse  15 mL Mouth Rinse q12n4p  . mesalamine  500 mg Oral TID  . OLANZapine zydis  10 mg Oral BID WC  . sodium chloride flush  10-40 mL Intracatheter Q12H   sodium chloride, acetaminophen, LORazepam, ondansetron (ZOFRAN) IV  Assessment/ Plan:  45 y.o.white female with a PMHx of Crohn's disease, depression, diabetes mellitus type 2, GERD, hiatal hernia, hyperlipidemia, hypertension, hypothyroidism, obesity, schizophrenia, traumatic brain injury, vitamin D deficiency, who was admitted to [facility] on 09/27/2016   1.  Acute renal failure: CRRT from 11/29 to 12/5. Hemodialysis treatment on 12/8 Acute renal failure sepsis, hypotension, and hypoxemia Presenting creatinine 4.09 on September 19, 2016.  Prior to that, the only known creatinine is 1.16 from January 2014 Serum creatinine continues to worsen.  Today's level is 6.6. Plan on PermCath placement tomorrow Possible outpatient dialysis for acute renal failure  Place PPD  2.  Hypokalemia - Likely GI losses - replace as necessary    LOS: 15 [name] 12/13/20172:44 PM

## 2016-10-12 NOTE — Progress Notes (Signed)
Physical Therapy Treatment Patient Details Name: [name] MRN: [id number] DOB: 06-Sep-1971 Today's Date: 10/12/2016    History of Present Illness Patient is a 45 y.o. female admitted on 28 NOV after being found unresponsive in group home bathroom after hitting her head on her dresser in the night. Patient diagnosed with acute septic shock from UTI, severe metabolic acidosis, and acute encephalopathy w/acute respiratory failure. PMH includes schizophrenia.    PT Comments    [name] presents alert and oriented x2 and agreeable to therapy.  She requires close min guard assist with sit<>stand and stand pivot transfers and reports that she feels like her legs are going to give way when she is pivoting to the chair.  She tolerated all interventions well this date.  Did not attempt ambulation as it was not safe to attempt at this time.  Pt will benefit from continued skilled PT services to increase functional independence and safety.  Recommendation for SNF at d/c remains appropriate.   Follow Up Recommendations  SNF     Equipment Recommendations  Other (comment) (TBD at next venue of care)    Recommendations for Other Services       Precautions / Restrictions Precautions Precautions: Fall Restrictions Weight Bearing Restrictions: No    Mobility  Bed Mobility Overal bed mobility: Needs Assistance Bed Mobility: Supine to Sit     Supine to sit: Min guard;HOB elevated     General bed mobility comments: Min guard for safety with bed mobility as pt requires increased time and uses bed rail to pull to sitting.  Pt reported initiall dizzines upon sitting which cleared within 30 seconds.  Transfers Overall transfer level: Needs assistance Equipment used: Rolling walker (2 wheeled) (Bariatric RW) Transfers: Sit to/from [facility] Sit to Stand: Min guard Stand pivot transfers: Min guard       General transfer comment: Pt stood with poor technique with both  UEs on RW, cues for correct technique.  As she began to pivot she became anxious about falling and reported that she felt like her knees were going to give way.    Ambulation/Gait             General Gait Details: not safe to attempt at this time   Stairs            Wheelchair Mobility    Modified Rankin (Stroke Patients Only)       Balance Overall balance assessment: Needs assistance Sitting-balance support: No upper extremity supported;Feet supported Sitting balance-Leahy Scale: Good     Standing balance support: Bilateral upper extremity supported;During functional activity Standing balance-Leahy Scale: Poor Standing balance comment: Relies on UE support from RW to steady                    Cognition Arousal/Alertness: Awake/alert Behavior During Therapy: WFL for tasks assessed/performed Overall Cognitive Status: No family/caregiver present to determine baseline cognitive functioning Area of Impairment: Orientation;Memory;Safety/judgement;Awareness;Problem solving Orientation Level: Disoriented to;Person;Time   Memory: Decreased short-term memory   Safety/Judgement: Decreased awareness of safety;Decreased awareness of deficits Awareness: Emergent Problem Solving: Requires verbal cues;Difficulty sequencing General Comments: Pt initally reporting a different name saying, "that's what I go by".  When asked if she goes by anthing else she replies with the name in her chart.  Pt demonstrates short term memory impairments and forgets instructions only moments after they are given.    Exercises General Exercises - Lower Extremity Ankle Circles/Pumps: 10 reps;Supine;Both;AROM Quad Sets: 10 reps;Strengthening;Both;Seated Gluteal  Sets: 10 reps;Strengthening;Both;Seated Long Arc Quad: Strengthening;Both;10 reps;Seated Hip Flexion/Marching: Strengthening;Both;10 reps;Seated    General Comments General comments (skin integrity, edema, etc.): BP supine at start  of session 94/48.  Seated in chair at end of session 100/54.       Pertinent Vitals/Pain Pain Assessment: No/denies pain    Home Living                      Prior Function            PT Goals (current goals can now be found in the care plan section) Acute Rehab PT Goals Patient Stated Goal: "To get better" PT Goal Formulation: With patient Time For Goal Achievement: 10/22/16 Potential to Achieve Goals: Fair Progress towards PT goals: Progressing toward goals    Frequency    Min 2X/week      PT Plan Current plan remains appropriate    Co-evaluation             End of Session   Activity Tolerance: Patient limited by fatigue Patient left: with call bell/phone within reach;in chair;with chair alarm set     Time: [id number] PT Time Calculation (min) (ACUTE ONLY): 24 min  Charges:  $Therapeutic Exercise: 8-22 mins $Therapeutic Activity: 8-22 mins                    G Codes:      Collie Siad PT, DPT 10/12/2016, 12:39 PM

## 2016-10-12 NOTE — Progress Notes (Signed)
Dr. [name] rounding this morning, aware that patient pulled out rectal tube twice last night and does not want another one. MD has placed patient on BRAT diet and wants patient to avoid milk to see if diarrhea improves.

## 2016-10-12 NOTE — Progress Notes (Signed)
Dr. Ether Griffins originally wanted [place] to speak with PCP to see why patient was taking mesalamine. Prior to calling PCP, patient's aunt called for update. She informed RN that patient was taking this med for Crohns disease, which was diagnosed in 2013. MD wanted Dr. [name] updated - he said he would watch patient on BRAT diet and see about doing colonoscopy later in the week.   Dr. Ether Griffins also aware of Hgb. Patient will probably need transfusion tomorrow during dialysis. Will not transfuse today.

## 2016-10-13 ENCOUNTER — Encounter
Admission: EM | Disposition: A | Discharge status: Skilled Nursing Facility | Payer: Self-pay | Source: Home / Self Care | Attending: Internal Medicine

## 2016-10-13 DIAGNOSIS — N189 Chronic kidney disease, unspecified: Secondary | ICD-10-CM

## 2016-10-13 HISTORY — PX: PERIPHERAL VASCULAR CATHETERIZATION: SHX172C

## 2016-10-13 LAB — BASIC METABOLIC PANEL
Anion gap: 11 (ref 5–15)
BUN: 63 mg/dL — ABNORMAL HIGH (ref 6–20)
CO2: 20 mmol/L — ABNORMAL LOW (ref 22–32)
CREATININE: 6.64 mg/dL — AB (ref 0.44–1.00)
Calcium: 7.3 mg/dL — ABNORMAL LOW (ref 8.9–10.3)
Chloride: 102 mmol/L (ref 101–111)
GFR calc Af Amer: 8 mL/min — ABNORMAL LOW (ref >60)
GFR, EST NON AFRICAN AMERICAN: 7 mL/min — AB (ref >60)
GLUCOSE: 85 mg/dL (ref 65–99)
POTASSIUM: 3.2 mmol/L — AB (ref 3.5–5.1)
Sodium: 133 mmol/L — ABNORMAL LOW (ref 135–145)

## 2016-10-13 LAB — GLUCOSE, CAPILLARY
GLUCOSE-CAPILLARY: 90 mg/dL (ref 65–99)
GLUCOSE-CAPILLARY: 89 mg/dL (ref 65–99)
Glucose-Capillary: 85 mg/dL (ref 65–99)
Glucose-Capillary: 84 mg/dL (ref 65–99)
Glucose-Capillary: 87 mg/dL (ref 65–99)

## 2016-10-13 LAB — PREPARE RBC (CROSSMATCH)

## 2016-10-13 LAB — HEMOGLOBIN: Hemoglobin: 6 g/dL — ABNORMAL LOW (ref 12.0–16.0)

## 2016-10-13 LAB — ABO/RH: ABO/RH(D): B POS

## 2016-10-13 LAB — T4: T4 TOTAL: 3.9 ug/dL — AB (ref 4.5–12.0)

## 2016-10-13 SURGERY — DIALYSIS/PERMA CATHETER INSERTION
Anesthesia: Moderate Sedation

## 2016-10-13 MED ORDER — HEPARIN SODIUM (PORCINE) 10000 UNIT/ML IJ SOLN
INTRAMUSCULAR | Status: AC
  Filled 2016-10-13: qty 1

## 2016-10-13 MED ORDER — HEPARIN (PORCINE) IN NACL 2-0.9 UNIT/ML-% IJ SOLN
INTRAMUSCULAR | Status: AC
  Filled 2016-10-13: qty 500

## 2016-10-13 MED ORDER — POTASSIUM CHLORIDE CRYS ER 20 MEQ PO TBCR
20.0000 meq | EXTENDED_RELEASE_TABLET | Freq: Once | ORAL | Status: AC
  Administered 2016-10-13: 20 meq via ORAL
  Filled 2016-10-13: qty 1

## 2016-10-13 MED ORDER — FENTANYL CITRATE (PF) 100 MCG/2ML IJ SOLN
INTRAMUSCULAR | Status: DC | PRN
  Administered 2016-10-13: 50 ug via INTRAVENOUS
  Administered 2016-10-13: 25 ug via INTRAVENOUS

## 2016-10-13 MED ORDER — PEG 3350-KCL-NA BICARB-NACL 420 G PO SOLR
4000.0000 mL | Freq: Once | ORAL | Status: AC
  Administered 2016-10-13: 4000 mL via ORAL
  Filled 2016-10-13: qty 4000

## 2016-10-13 MED ORDER — SODIUM CHLORIDE 0.9 % IV SOLN
INTRAVENOUS | Status: DC
  Administered 2016-10-14: 1000 mL via INTRAVENOUS

## 2016-10-13 MED ORDER — LIDOCAINE-EPINEPHRINE 1 %-1:100000 IJ SOLN
INTRAMUSCULAR | Status: AC
  Filled 2016-10-13: qty 1

## 2016-10-13 MED ORDER — SODIUM CHLORIDE 0.9 % IV SOLN: Freq: Once | INTRAVENOUS | Status: DC

## 2016-10-13 MED ORDER — MIDAZOLAM HCL 5 MG/5ML IJ SOLN
INTRAMUSCULAR | Status: AC
  Filled 2016-10-13: qty 5

## 2016-10-13 MED ORDER — SODIUM CHLORIDE 0.9 % IV SOLN
Freq: Once | INTRAVENOUS | Status: AC
  Administered 2016-10-13: 14:00:00 via INTRAVENOUS

## 2016-10-13 MED ORDER — FENTANYL CITRATE (PF) 100 MCG/2ML IJ SOLN
INTRAMUSCULAR | Status: AC
  Filled 2016-10-13: qty 2

## 2016-10-13 MED ORDER — MIDAZOLAM HCL 2 MG/2ML IJ SOLN
INTRAMUSCULAR | Status: DC | PRN
Start: 2016-10-13 — End: 2016-10-13
  Administered 2016-10-13: 1 mg via INTRAVENOUS
  Administered 2016-10-13: 2 mg via INTRAVENOUS

## 2016-10-13 SURGICAL SUPPLY — 2 items
CATH PALINDROME RT-P 15FX19CM (CATHETERS) ×3 IMPLANT
PACK ANGIOGRAPHY (CUSTOM PROCEDURE TRAY) ×3 IMPLANT

## 2016-10-13 NOTE — Progress Notes (Signed)
Patient has scheduled colonoscopy in the am. Golytely started at shift change. Patient consumed approximately 2000 cc's of the solution. RN entered the room stating she would raise patient up to sitting position so she could finish her golytely. Patient picked up the cup while lying in the bed and wasted it on herself. RN asked patient is she was trying to tell her that she didn't want anymore of the solution and the patient said yes. "I dont want anymore of that stuff"

## 2016-10-13 NOTE — Progress Notes (Signed)
[facility]  ROUNDING NOTE   Subjective:   Patient is overall doing fair Appropriate diet is being addressed for her condition Serum creatinine high again today at 6.64, BUN 63, GFR 7 Phosphorus high at 5.3 and albumin is low at 2.0 Hemoglobin also remains low at 6.0    Objective:  Vital signs in last 24 hours:  Temp:  [98.3 F (36.8 C)-98.8 F (37.1 C)] 98.4 F (36.9 C) (12/14 1358) Pulse Rate:  [88-98] 88 (12/14 1535) Resp:  [16-24] 20 (12/14 1535) BP: (90-124)/(31-59) 100/46 (12/14 1535) SpO2:  [92 %-96 %] 96 % (12/14 1358) Weight:  [120.1 kg (264 lb 12.8 oz)] 120.1 kg (264 lb 12.8 oz) (12/14 0401)  Weight change: -1.724 kg (-3 lb 12.8 oz) Filed Weights   10/11/16 0315 10/12/16 0426 10/13/16 0401  Weight: 122.7 kg (270 lb 9.6 oz) 121.8 kg (268 lb 9.6 oz) 120.1 kg (264 lb 12.8 oz)    Intake/Output: I/O last 3 completed shifts: In: 1310 [P.O.:600; I.V.:10; Other:700] Out: 1075 [Urine:1075]   Intake/Output this shift:  Total I/O In: 500 [I.V.:500] Out: -   Physical Exam: General: No acute distress  Head: OM moist  Eyes: Anicteric  Neck: Supple, trachea midline  Lungs:  clear, normal effort   Heart: S1S2 No rubs   Abdomen:  Soft, non tender  Extremities: Trace peripheral edema.  Neurologic: Alert, able to answer questions  Skin: No lesions  Access:   Foley  Basic Metabolic Panel:  Recent Labs Lab 10/07/16 0434 10/08/16 0545 10/09/16 0513 10/10/16 0625 10/12/16 0440 10/13/16 0514  NA 149* 142 136 137 135 133*  K 3.1* 3.3* 3.2* 3.2* 3.6 3.2*  CL 116* 105 102 103 104 102  CO2 24 30 28 26 22  20*  GLUCOSE 107* 92 99 93 92 85  BUN 37* 21* 32* 42* 61* 63*  CREATININE 4.19* 3.44* 4.71* 5.60* 6.68* 6.64*  CALCIUM 8.0* 7.6* 7.5* 7.6* 7.8* 7.3*  PHOS 4.3  --  4.5 4.4 5.3*  --     Liver Function Tests:  Recent Labs Lab 10/07/16 0434 10/09/16 0513 10/10/16 0625 10/12/16 0440  ALBUMIN 2.3* 1.9* 1.9* 2.0*   No results for input(s):  LIPASE, AMYLASE in the last 168 hours. No results for input(s): AMMONIA in the last 168 hours.  CBC:  Recent Labs Lab 10/07/16 0434 10/09/16 0513 10/10/16 0625 10/12/16 0440 10/13/16 0514  WBC 17.2* 13.9* 13.1* 10.4  --   NEUTROABS 11.0*  --   --  4.9  --   HGB 7.8* 7.1* 6.8* 7.0* 6.0*  HCT 23.6* 21.3* 20.8* 20.3*  --   MCV 93.4 93.4 93.5 93.0  --   PLT 239 313 349 433  --     Cardiac Enzymes: No results for input(s): CKTOTAL, CKMB, CKMBINDEX, TROPONINI in the last 168 hours.  BNP: Invalid input(s): POCBNP  CBG:  Recent Labs Lab 10/12/16 2141 10/13/16 0748 10/13/16 0930 10/13/16 1445 10/13/16 1636  GLUCAP 90 85 84 87 90    Microbiology: Results for orders placed or performed during the hospital encounter of 09/27/16  Blood Culture (routine x 2)     Status: None   Collection Time: 09/27/16  6:02 AM  Result Value Ref Range Status   Specimen Description BLOOD  R AC  Final   Special Requests   Final    BOTTLES DRAWN AEROBIC AND ANAEROBIC  AER 3 ML ANA 4 ML   Culture NO GROWTH 5 DAYS  Final   Report Status 10/02/2016  FINAL  Final  Blood Culture (routine x 2)     Status: None   Collection Time: 09/27/16  6:03 AM  Result Value Ref Range Status   Specimen Description BLOOD  R ARM  Final   Special Requests   Final    BOTTLES DRAWN AEROBIC AND ANAEROBIC  AER 6 ML ANA 10 ML   Culture NO GROWTH 5 DAYS  Final   Report Status 10/02/2016 FINAL  Final  Urine culture     Status: None   Collection Time: 09/27/16  9:30 AM  Result Value Ref Range Status   Specimen Description URINE, RANDOM  Final   Special Requests NONE  Final   Culture NO GROWTH Performed at [facility]   Final   Report Status 09/28/2016 FINAL  Final  MRSA PCR Screening     Status: None   Collection Time: 09/27/16 11:09 AM  Result Value Ref Range Status   MRSA by PCR NEGATIVE NEGATIVE Final    Comment:        The GeneXpert MRSA Assay (FDA approved for NASAL specimens only), is one  component of a comprehensive MRSA colonization surveillance program. It is not intended to diagnose MRSA infection nor to guide or monitor treatment for MRSA infections.   C difficile quick scan w PCR reflex     Status: None   Collection Time: 10/04/16 12:20 AM  Result Value Ref Range Status   C Diff antigen NEGATIVE NEGATIVE Final   C Diff toxin NEGATIVE NEGATIVE Final   C Diff interpretation No C. difficile detected.  Final  Gastrointestinal Panel by PCR , Stool     Status: None   Collection Time: 10/08/16  7:00 PM  Result Value Ref Range Status   Campylobacter species NOT DETECTED NOT DETECTED Final   Plesimonas shigelloides NOT DETECTED NOT DETECTED Final   Salmonella species NOT DETECTED NOT DETECTED Final   Yersinia enterocolitica NOT DETECTED NOT DETECTED Final   Vibrio species NOT DETECTED NOT DETECTED Final   Vibrio cholerae NOT DETECTED NOT DETECTED Final   Enteroaggregative E coli (EAEC) NOT DETECTED NOT DETECTED Final   Enteropathogenic E coli (EPEC) NOT DETECTED NOT DETECTED Final   Enterotoxigenic E coli (ETEC) NOT DETECTED NOT DETECTED Final   Shiga like toxin producing E coli (STEC) NOT DETECTED NOT DETECTED Final   Shigella/Enteroinvasive E coli (EIEC) NOT DETECTED NOT DETECTED Final   Cryptosporidium NOT DETECTED NOT DETECTED Final   Cyclospora cayetanensis NOT DETECTED NOT DETECTED Final   Entamoeba histolytica NOT DETECTED NOT DETECTED Final   Giardia lamblia NOT DETECTED NOT DETECTED Final   Adenovirus F40/41 NOT DETECTED NOT DETECTED Final   Astrovirus NOT DETECTED NOT DETECTED Final   Norovirus GI/GII NOT DETECTED NOT DETECTED Final   Rotavirus A NOT DETECTED NOT DETECTED Final   Sapovirus (I, II, IV, and V) NOT DETECTED NOT DETECTED Final    Coagulation Studies: No results for input(s): LABPROT, INR in the last 72 hours.  Urinalysis: No results for input(s): COLORURINE, LABSPEC, PHURINE, GLUCOSEU, HGBUR, BILIRUBINUR, KETONESUR, PROTEINUR,  UROBILINOGEN, NITRITE, LEUKOCYTESUR in the last 72 hours.  Invalid input(s): APPERANCEUR    Imaging: No results found.   Medications:   . sodium chloride     . bacitracin   Topical BID  .  ceFAZolin (ANCEF) IV  1 g Intravenous On Call  . chlorhexidine  15 mL Mouth Rinse BID  . cholestyramine light  4 g Oral BID  . citalopram  40 mg Oral Daily  .  cloZAPine  200 mg Oral Daily  . epoetin (EPOGEN/PROCRIT) injection  10,000 Units Intravenous Once  . famotidine  20 mg Oral Daily  . haloperidol lactate  5 mg Intravenous Once  . heparin  5,000 Units Subcutaneous Q8H  . mesalamine  500 mg Oral TID  . OLANZapine zydis  10 mg Oral BID WC  . polyethylene glycol-electrolytes  4,000 mL Oral Once  . potassium chloride  20 mEq Oral Once  . sodium chloride flush  10-40 mL Intracatheter Q12H  . tuberculin  5 Units Intradermal Once   sodium chloride, acetaminophen, LORazepam, ondansetron (ZOFRAN) IV  Assessment/ Plan:  45 y.o.white female with a PMHx of Crohn's disease, depression, diabetes mellitus type 2, GERD, hiatal hernia, hyperlipidemia, hypertension, hypothyroidism, obesity, schizophrenia, traumatic brain injury, vitamin D deficiency, who was admitted to [facility] on 09/27/2016   1.  Acute renal failure: CRRT from 11/29 to 12/5. Hemodialysis treatment on 12/8 Acute renal failure sepsis, hypotension, and hypoxemia Presenting creatinine 4.09 on September 19, 2016.  Prior to that, the only known creatinine is 1.16 from January 2014 Serum creatinine continues to worsen.  Today's level is 6.6. Plan on PermCath placement   Possible outpatient dialysis for acute renal failure  Place PPD Will start 1st HD treatment on Friday  2.  Hypokalemia - Likely GI losses - replace as necessary    LOS: 16 [name] 12/14/20174:57 PM

## 2016-10-13 NOTE — Op Note (Signed)
OPERATIVE NOTE    PRE-OPERATIVE DIAGNOSIS: 1. ESRD   POST-OPERATIVE DIAGNOSIS: same as above  PROCEDURE: 1. Ultrasound guidance for vascular access to the right internal jugular vein 2. Fluoroscopic guidance for placement of catheter 3. Placement of a 19 cm tip to cuff tunneled hemodialysis catheter via the right internal jugular vein  SURGEON: Leotis Pain, MD  ANESTHESIA:  Local with Moderate conscious sedation for approximately 20 minutes using 2 mg of Versed and 75 mcg of Fentanyl  ESTIMATED BLOOD LOSS: 50 cc  FLUORO TIME: less than one minute  CONTRAST: none  FINDING(S): 1.  Patent right internal jugular vein  SPECIMEN(S):  None  INDICATIONS:   [name] is a 45 y.o.female who presents with renal failure.  The patient needs long term dialysis access for their ESRD, and a Permcath is necessary.  Risks and benefits are discussed and informed consent is obtained.    DESCRIPTION: After obtaining full informed written consent, the patient was brought back to the vascular suited. The patient's right neck and chest were sterilely prepped and draped in a sterile surgical field was created. Moderate conscious sedation was administered during a face to face encounter with the patient throughout the procedure with my supervision of the RN administering medicines and monitoring the patient's vital signs, pulse oximetry, telemetry and mental status throughout from the start of the procedure until the patient was taken to the recovery room.  The right internal jugular vein was visualized with ultrasound and found to be patent. It was then accessed under direct ultrasound guidance and a permanent image was recorded. A wire was placed. After skin nick and dilatation, the peel-away sheath was placed over the wire. I then turned my attention to an area under the clavicle. Approximately 1-2 fingerbreadths below the clavicle a small counterincision was created and tunneled from the  subclavicular incision to the access site. Using fluoroscopic guidance, a 19 centimeter tip to cuff tunneled hemodialysis catheter was selected, and tunneled from the subclavicular incision to the access site. It was then placed through the peel-away sheath and the peel-away sheath was removed. Using fluoroscopic guidance the catheter tips were parked in the right atrium. The appropriate distal connectors were placed. It withdrew blood well and flushed easily with heparinized saline and a concentrated heparin solution was then placed. It was secured to the chest wall with 2 Prolene sutures. The access incision was closed single 4-0 Monocryl. A 4-0 Monocryl pursestring suture was placed around the exit site. Sterile dressings were placed. The patient tolerated the procedure well and was taken to the recovery room in stable condition.  COMPLICATIONS: None  CONDITION: Stable  Leotis Pain  08/08/2016, 12:56 PM   This note was created with Dragon Medical transcription system. Any errors in dictation are purely unintentional.

## 2016-10-13 NOTE — Progress Notes (Signed)
Patient left for perm cath placement via bed and transported by RN from specials

## 2016-10-13 NOTE — Progress Notes (Signed)
Jonathon Bellows MD [address], [place], [place] [address] Phone: [phone] Fax : [phone]  [name] is being followed for 3 Day diarrhea of follow up   Subjective: Still having non bloody diarrhea   Objective: Vital signs in last 24 hours: Vitals:   10/13/16 0401 10/13/16 0437 10/13/16 0445 10/13/16 0451  BP:  (!) 95/31 (!) 90/32 (!) 91/42  Pulse:  95  94  Resp:  18    Temp:  98.4 F (36.9 C)    TempSrc:  Oral    SpO2:  95%    Weight: 264 lb 12.8 oz (120.1 kg)     Height:       Weight change: -3 lb 12.8 oz (-1.724 kg)  Intake/Output Summary (Last 24 hours) at 10/13/16 1030 Last data filed at 10/13/16 0640  Gross per 24 hour  Intake              370 ml  Output              575 ml  Net             -205 ml     Exam: Heart:: S1S2 present Lungs: normal Abdomen: soft, nontender, normal bowel sounds   Lab Results: @LABTEST2 @ Micro Results: Recent Results (from the past 240 hour(s))  C difficile quick scan w PCR reflex     Status: None   Collection Time: 10/04/16 12:20 AM  Result Value Ref Range Status   C Diff antigen NEGATIVE NEGATIVE Final   C Diff toxin NEGATIVE NEGATIVE Final   C Diff interpretation No C. difficile detected.  Final  Gastrointestinal Panel by PCR , Stool     Status: None   Collection Time: 10/08/16  7:00 PM  Result Value Ref Range Status   Campylobacter species NOT DETECTED NOT DETECTED Final   Plesimonas shigelloides NOT DETECTED NOT DETECTED Final   Salmonella species NOT DETECTED NOT DETECTED Final   Yersinia enterocolitica NOT DETECTED NOT DETECTED Final   Vibrio species NOT DETECTED NOT DETECTED Final   Vibrio cholerae NOT DETECTED NOT DETECTED Final   Enteroaggregative E coli (EAEC) NOT DETECTED NOT DETECTED Final   Enteropathogenic E coli (EPEC) NOT DETECTED NOT DETECTED Final   Enterotoxigenic E coli (ETEC) NOT DETECTED NOT DETECTED Final   Shiga like toxin producing E coli (STEC) NOT DETECTED NOT DETECTED  Final   Shigella/Enteroinvasive E coli (EIEC) NOT DETECTED NOT DETECTED Final   Cryptosporidium NOT DETECTED NOT DETECTED Final   Cyclospora cayetanensis NOT DETECTED NOT DETECTED Final   Entamoeba histolytica NOT DETECTED NOT DETECTED Final   Giardia lamblia NOT DETECTED NOT DETECTED Final   Adenovirus F40/41 NOT DETECTED NOT DETECTED Final   Astrovirus NOT DETECTED NOT DETECTED Final   Norovirus GI/GII NOT DETECTED NOT DETECTED Final   Rotavirus A NOT DETECTED NOT DETECTED Final   Sapovirus (I, II, IV, and V) NOT DETECTED NOT DETECTED Final   Studies/Results: No results found. Medications: I have reviewed the patient's current medications. Scheduled Meds: . bacitracin   Topical BID  .  ceFAZolin (ANCEF) IV  1 g Intravenous On Call  . chlorhexidine  15 mL Mouth Rinse BID  . cholestyramine light  4 g Oral BID  . citalopram  40 mg Oral Daily  . cloZAPine  200 mg Oral Daily  . epoetin (EPOGEN/PROCRIT) injection  10,000 Units Intravenous Once  . famotidine  20 mg Oral Daily  . haloperidol lactate  5 mg Intravenous Once  .  heparin  5,000 Units Subcutaneous Q8H  . mouth rinse  15 mL Mouth Rinse q12n4p  . mesalamine  500 mg Oral TID  . OLANZapine zydis  10 mg Oral BID WC  . sodium chloride flush  10-40 mL Intracatheter Q12H  . tuberculin  5 Units Intradermal Once   Continuous Infusions: PRN Meds:.sodium chloride, acetaminophen, LORazepam, ondansetron (ZOFRAN) IV   Assessment: Principal Problem:   Undifferentiated schizophrenia ([place]) Active Problems:   Respiratory failure ([place])   Acute renal failure (ARF) ([facility])   Pressure injury of skin   Scalp laceration   [name] is being followed for acute diarrhea. 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 Last colonoscopy in 01/2016 showed inflammation from rectum to cecum biopsy showed mild chronic inactive colitis. She has not responded to mesalamine. She has been on Humira in the past , appears per last note was sent to [facility] for  second opinion. I do not seen a subsequent office visit after 01/2016.    Plan: 1. Colonoscopy with biopsies tomorrow to asses status of crohs colitis.    LOS: 16 days   Jonathon Bellows 10/13/2016, 10:30 AM

## 2016-10-13 NOTE — Progress Notes (Signed)
Pt arrived from specials.  BP 90/46. Patient lethargic.  Dr Ether Griffins gave orders for Normal Saline 522ml

## 2016-10-13 NOTE — Clinical Social Work Note (Signed)
Patient's pasrr received: [id number] F. Shela Leff MSW,LCSW [phone]

## 2016-10-13 NOTE — Progress Notes (Signed)
Called Dr. Marcille Blanco regarding patient's blood pressure.  Doctor will continue to monitor as long as patient is asymptomatic.  Phoebe Sharps N 10/13/2016  5:02 AM

## 2016-10-13 NOTE — Consult Note (Signed)
[facility] Consult   Reason for Consult:  Follow-up for this 45 year old woman with schizophrenia who is recovering from sepsis. No longer in the ICU. Making significant progress. Referring Physician:  Kasa Patient Identification: [name] MRN:  [id number] Principal Diagnosis: Undifferentiated schizophrenia [facility]) Diagnosis:   Patient Active Problem List   Diagnosis Date Noted  . Scalp laceration [S01.01XA]   . Pressure injury of skin [L89.90] 10/02/2016  . Undifferentiated schizophrenia ([place]) [F20.3] 09/30/2016  . Acute renal failure (ARF) ([place]) [N17.9]   . Respiratory failure ([place]) [J96.90] 09/27/2016  . Other sign and symptom in breast [N64.59]     Total Time spent with patient: 20 minutes  Subjective:   [name] is a 45 y.o. female patient admitted with "I guess I'm okay".  HPI: Follow-up December 14. Patient is not showing any complaints. Calm and appropriate behavior. Denies hallucinations.  Risk to Self: Is patient at risk for suicide?: No Risk to Others:   Prior Inpatient Therapy:   Prior Outpatient Therapy:    Past Medical History:  Past Medical History:  Diagnosis Date  . Abnormal mammogram, unspecified 2013   Prev. cytology,hypercellular smears without evidence of malignant cells. The cytopathologist questioned if samples truly representative. Care taken during sampling and is felt to be representative.  . Breast screening, unspecified 2013  . Broken leg    age 25  . Cellulitis   . Crohn's disease ([place]) 2013  . Depression   . Diabetes mellitus without complication (HCC)    non insulin dependent  . Early menopause   . Edema   . GERD (gastroesophageal reflux disease)   . Hiatal hernia 2013  . Hyperlipidemia   . Hypertension   . Hypothyroidism   . 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 Obesity, unspecified 2013  . Other sign and symptom in breast 2013   Right bst US,lower outer quadrant,A single  0.3x0.4x0.6cm hypoechoic mass with slightly lobulated borders with adjacent 0.3x0.4x0.5cm mass was noted. The 1st was 5cm from nipple, 2nd at 8 cm from the nipple. The previous lesion aspirate was at 3 0'clock position. These lesions are thought to account for the mammographic abnormality. Minimal interval change on [place].   [facility])   . Rib fracture    age 80  . Schizophrenia ([place])   . TBI (traumatic brain injury) ([place])   . Thyroid disease    hypothyroid  . Vitamin D deficiency     Past Surgical History:  Procedure Laterality Date  . COLONOSCOPY WITH PROPOFOL N/A 02/16/2016   Procedure: COLONOSCOPY WITH PROPOFOL;  Surgeon: Lollie Sails, MD;  Location: [facility] ENDOSCOPY;  Service: Endoscopy;  Laterality: N/A;  . COLONOSCOPY WITH PROPOFOL N/A 02/22/2016   Procedure: COLONOSCOPY WITH PROPOFOL;  Surgeon: Lollie Sails, MD;  Location: [facility] ENDOSCOPY;  Service: Endoscopy;  Laterality: N/A;  . EYE SURGERY Left 2013   cataract surgery  . TUMOR EXCISION Right    age of 50, tumor removed from RUE   Family History:  Family History  Problem Relation Age of Onset  . Cancer Mother 48    colon  . Cancer Paternal Aunt    Family Psychiatric  History: None identified Social History:  History  Alcohol Use No     History  Drug Use No    Social History   Social History  . Marital status: Single    Spouse name: N/A  . Number of children: N/A  .  Years of education: N/A   Social History Main Topics  . Smoking status: Never Smoker  . Smokeless tobacco: Never Used  . Alcohol use No  . Drug use: No  . Sexual activity: Not Asked   Other Topics Concern  . None   Social History Narrative  . None   Additional Social History:    Allergies:   Allergies  Allergen Reactions  . Peanut Oil Other (See Comments)    Face turns red  . Risperidone And Related Cough    Labs:  Results for orders placed or performed during the hospital encounter of 09/27/16 (from the past 48  hour(s))  Glucose, capillary     Status: None   Collection Time: 10/11/16 11:57 PM  Result Value Ref Range   Glucose-Capillary 91 65 - 99 mg/dL  TSH     Status: Abnormal   Collection Time: 10/12/16  4:40 AM  Result Value Ref Range   TSH 6.751 (H) 0.350 - 4.500 uIU/mL    Comment: Performed by a 3rd Generation assay with a functional sensitivity of <=0.01 uIU/mL.  CBC with Differential/Platelet     Status: Abnormal   Collection Time: 10/12/16  4:40 AM  Result Value Ref Range   WBC 10.4 3.6 - 11.0 K/uL   RBC 2.19 (L) 3.80 - 5.20 MIL/uL   Hemoglobin 7.0 (L) 12.0 - 16.0 g/dL   HCT 20.3 (L) 35.0 - 47.0 %   MCV 93.0 80.0 - 100.0 fL   MCH 31.8 26.0 - 34.0 pg   MCHC 34.2 32.0 - 36.0 g/dL   RDW 14.7 (H) 11.5 - 14.5 %   Platelets 433 150 - 440 K/uL   Neutrophils Relative % 46 %   Neutro Abs 4.9 1.4 - 6.5 K/uL   Lymphocytes Relative 35 %   Lymphs Abs 3.6 1.0 - 3.6 K/uL   Monocytes Relative 17 %   Monocytes Absolute 1.8 (H) 0.2 - 0.9 K/uL   Eosinophils Relative 1 %   Eosinophils Absolute 0.1 0 - 0.7 K/uL   Basophils Relative 1 %   Basophils Absolute 0.1 0 - 0.1 K/uL  Renal function panel     Status: Abnormal   Collection Time: 10/12/16  4:40 AM  Result Value Ref Range   Sodium 135 135 - 145 mmol/L   Potassium 3.6 3.5 - 5.1 mmol/L   Chloride 104 101 - 111 mmol/L   CO2 22 22 - 32 mmol/L   Glucose, Bld 92 65 - 99 mg/dL   BUN 61 (H) 6 - 20 mg/dL   Creatinine, Ser 6.68 (H) 0.44 - 1.00 mg/dL   Calcium 7.8 (L) 8.9 - 10.3 mg/dL   Phosphorus 5.3 (H) 2.5 - 4.6 mg/dL   Albumin 2.0 (L) 3.5 - 5.0 g/dL   GFR calc non Af Amer 7 (L) >60 mL/min   GFR calc Af Amer 8 (L) >60 mL/min    Comment: (NOTE) The eGFR has been calculated using the CKD EPI equation. This calculation has not been validated in all clinical situations. eGFR's persistently <60 mL/min signify possible Chronic Kidney Disease.    Anion gap 9 5 - 15  T4     Status: Abnormal   Collection Time: 10/12/16  4:40 AM  Result Value  Ref Range   T4, Total 3.9 (L) 4.5 - 12.0 ug/dL    Comment: (NOTE) Performed At: [facility] [place], [place] [id number] Lindon Romp MD [id number]   Iron and TIBC     Status:  Abnormal   Collection Time: 10/12/16  4:40 AM  Result Value Ref Range   Iron 10 (L) 28 - 170 ug/dL   TIBC 128 (L) 250 - 450 ug/dL   Saturation Ratios 8 (L) 10.4 - 31.8 %   UIBC 118 ug/dL  Ferritin     Status: Abnormal   Collection Time: 10/12/16  4:40 AM  Result Value Ref Range   Ferritin 490 (H) 11 - 307 ng/mL  Glucose, capillary     Status: None   Collection Time: 10/12/16  5:49 AM  Result Value Ref Range   Glucose-Capillary 98 65 - 99 mg/dL  Glucose, capillary     Status: None   Collection Time: 10/12/16  7:20 AM  Result Value Ref Range   Glucose-Capillary 88 65 - 99 mg/dL   Comment 1 Notify RN    Comment 2 Document in Chart   Glucose, capillary     Status: Abnormal   Collection Time: 10/12/16 11:17 AM  Result Value Ref Range   Glucose-Capillary 106 (H) 65 - 99 mg/dL   Comment 1 Notify RN    Comment 2 Document in Chart   Glucose, capillary     Status: None   Collection Time: 10/12/16  4:07 PM  Result Value Ref Range   Glucose-Capillary 96 65 - 99 mg/dL   Comment 1 Notify RN    Comment 2 Document in Chart   Glucose, capillary     Status: None   Collection Time: 10/12/16  9:41 PM  Result Value Ref Range   Glucose-Capillary 90 65 - 99 mg/dL  Basic metabolic panel     Status: Abnormal   Collection Time: 10/13/16  5:14 AM  Result Value Ref Range   Sodium 133 (L) 135 - 145 mmol/L   Potassium 3.2 (L) 3.5 - 5.1 mmol/L   Chloride 102 101 - 111 mmol/L   CO2 20 (L) 22 - 32 mmol/L   Glucose, Bld 85 65 - 99 mg/dL   BUN 63 (H) 6 - 20 mg/dL   Creatinine, Ser 6.64 (H) 0.44 - 1.00 mg/dL   Calcium 7.3 (L) 8.9 - 10.3 mg/dL   GFR calc non Af Amer 7 (L) >60 mL/min   GFR calc Af Amer 8 (L) >60 mL/min    Comment: (NOTE) The eGFR has been calculated using the CKD EPI  equation. This calculation has not been validated in all clinical situations. eGFR's persistently <60 mL/min signify possible Chronic Kidney Disease.    Anion gap 11 5 - 15  Hemoglobin     Status: Abnormal   Collection Time: 10/13/16  5:14 AM  Result Value Ref Range   Hemoglobin 6.0 (L) 12.0 - 16.0 g/dL  ABO/Rh     Status: None   Collection Time: 10/13/16  5:14 AM  Result Value Ref Range   ABO/RH(D) B POS   Glucose, capillary     Status: None   Collection Time: 10/13/16  7:48 AM  Result Value Ref Range   Glucose-Capillary 85 65 - 99 mg/dL  Glucose, capillary     Status: None   Collection Time: 10/13/16  9:30 AM  Result Value Ref Range   Glucose-Capillary 84 65 - 99 mg/dL  Glucose, capillary     Status: None   Collection Time: 10/13/16  2:45 PM  Result Value Ref Range   Glucose-Capillary 87 65 - 99 mg/dL   Comment 1 Notify RN   Glucose, capillary     Status: None   Collection Time: 10/13/16  4:36 PM  Result Value Ref Range   Glucose-Capillary 90 65 - 99 mg/dL  Type and screen [place]     Status: None (Preliminary result)   Collection Time: 10/13/16  6:03 PM  Result Value Ref Range   ISSUE DATE / TIME [id number]    Blood Product Unit Number [id number]    PRODUCT CODE [id number]    Unit Type and Rh 7300    Blood Product Expiration Date [id number]   Prepare RBC     Status: None   Collection Time: 10/13/16  6:03 PM  Result Value Ref Range   Order Confirmation ORDER PROCESSED BY BLOOD BANK     Current Facility-Administered Medications  Medication Dose Route Frequency Provider Last Rate Last Dose  . 0.9 %  sodium chloride infusion  250 mL Intravenous PRN Flora Lipps, MD 20 mL/hr at 10/06/16 1600 250 mL at 10/06/16 1600  . 0.9 %  sodium chloride infusion   Intravenous Continuous Jonathon Bellows, MD      . 0.9 %  sodium chloride infusion   Intravenous Once Theodoro Grist, MD      . acetaminophen (TYLENOL) tablet 650 mg  650 mg Oral Q4H PRN Flora Lipps,  MD   650 mg at 10/08/16 1044  . bacitracin ointment   Topical BID Jules Husbands, MD      . ceFAZolin (ANCEF) IVPB 1 g/50 mL premix  1 g Intravenous On Call Janalyn Harder Stegmayer, PA-C      . chlorhexidine (PERIDEX) 0.12 % solution 15 mL  15 mL Mouth Rinse BID Flora Lipps, MD   15 mL at 10/13/16 0937  . cholestyramine light (PREVALITE) packet 4 g  4 g Oral BID Theodoro Grist, MD   4 g at 10/12/16 2346  . citalopram (CELEXA) tablet 40 mg  40 mg Oral Daily Gonzella Lex, MD   40 mg at 10/13/16 0933  . cloZAPine (CLOZARIL) tablet 200 mg  200 mg Oral Daily Gonzella Lex, MD   200 mg at 10/13/16 6812  . epoetin alfa (EPOGEN,PROCRIT) injection 10,000 Units  10,000 Units Intravenous Once Lavonia Dana, MD      . famotidine (PEPCID) tablet 20 mg  20 mg Oral Daily Lenis Noon, RPH   20 mg at 10/13/16 7517  . haloperidol lactate (HALDOL) injection 5 mg  5 mg Intravenous Once Harrie Foreman, MD      . heparin injection 5,000 Units  5,000 Units Subcutaneous Q8H Flora Lipps, MD   5,000 Units at 10/13/16 1542  . LORazepam (ATIVAN) tablet 2 mg  2 mg Oral Q6H PRN Laverle Hobby, MD   2 mg at 10/09/16 2317  . mesalamine (PENTASA) CR capsule 500 mg  500 mg Oral TID Theodoro Grist, MD   500 mg at 10/13/16 1543  . OLANZapine zydis (ZYPREXA) disintegrating tablet 10 mg  10 mg Oral BID WC Gonzella Lex, MD   10 mg at 10/13/16 0934  . ondansetron (ZOFRAN) injection 4 mg  4 mg Intravenous Q6H PRN Flora Lipps, MD      . polyethylene glycol-electrolytes (NuLYTELY/GoLYTELY) solution 4,000 mL  4,000 mL Oral Once Jonathon Bellows, MD   4,000 mL at 10/13/16 1802  . sodium chloride flush (NS) 0.9 % injection 10-40 mL  10-40 mL Intracatheter Q12H Flora Lipps, MD   10 mL at 10/13/16 1530  . tuberculin injection 5 Units  5 Units Intradermal Once Murlean Iba, MD        Musculoskeletal: Strength &  Muscle Tone: decreased Gait & Station: unable to stand Patient leans: N/A  Psychiatric Specialty Exam: Physical Exam   Nursing note and vitals reviewed. Constitutional: She appears well-developed and well-nourished. No distress.  HENT:  Head: Normocephalic and atraumatic.  Eyes: Conjunctivae are normal. Pupils are equal, round, and reactive to light.  Neck: Normal range of motion.  Cardiovascular: Regular rhythm and normal heart sounds.   Respiratory: Effort normal. No respiratory distress.  GI: Soft.  Musculoskeletal: Normal range of motion.  Neurological: Coordination normal.  Skin: Skin is warm and dry.  Psychiatric: Her speech is normal. Her affect is not labile and not inappropriate. She is not agitated and not actively hallucinating. Thought content is not delusional. She does not express impulsivity. She expresses no homicidal and no suicidal ideation. She exhibits abnormal recent memory and abnormal remote memory. She is attentive.    Review of Systems  Unable to perform ROS: Medical condition  Constitutional: Negative.   HENT: Negative.   Eyes: Negative.   Respiratory: Negative.   Cardiovascular: Negative.   Gastrointestinal: Negative.   Musculoskeletal: Negative.   Skin: Negative.   Neurological: Negative.   Psychiatric/Behavioral: Positive for hallucinations and memory loss. Negative for depression, substance abuse and suicidal ideas. The patient is nervous/anxious. The patient does not have insomnia.     Blood pressure (!) 93/34, pulse (!) 56, temperature 98.2 F (36.8 C), temperature source Oral, resp. rate 20, height 5' 6"  (1.676 m), weight 120.1 kg (264 lb 12.8 oz), SpO2 94 %.Body mass index is 42.74 kg/m.  General Appearance: Disheveled  Eye Contact:  Minimal  Speech:  Garbled  Volume:  Decreased  Mood:  Dysphoric and Irritable  Affect:  Inappropriate  Thought Process:  Disorganized  Orientation:  Negative  Thought Content:  Negative  Suicidal Thoughts:  No  Homicidal Thoughts:  No  Memory:  Negative  Judgement:  Negative  Insight:  Negative  Psychomotor Activity:   Restlessness  Concentration:  Concentration: Poor  Recall:  Poor  Fund of Knowledge:  Fair  Language:  Fair  Akathisia:  No  Handed:  Right  AIMS (if indicated):     Assets:  Catering manager Housing  ADL's:  Impaired  Cognition:  Impaired,  Mild and Moderate  Sleep:        Treatment Plan Summary: Daily contact with patient to assess and evaluate symptoms and progress in treatment, Medication management and Plan Brief follow-up with patient who appears to be continuing to progress in her recovery. No psychiatric complaints. No evidence of psychosis. No agitation. No change to medicine.  Disposition: Patient does not meet criteria for psychiatric inpatient admission.  Alethia Berthold, MD 10/13/2016 8:44 PM

## 2016-10-13 NOTE — OR Nursing (Signed)
[place] legal guardian to obtain consent for perma cath insertion.

## 2016-10-13 NOTE — Plan of Care (Signed)
Problem: Education: Goal: Knowledge of [facility] Education information/materials will improve Outcome: Not Progressing Patient needs assistance.  Problem: Health Behavior/Discharge Planning: Goal: Ability to manage health-related needs will improve Outcome: Not Progressing Patient needs assistance.  Problem: Activity: Goal: Risk for activity intolerance will decrease Outcome: Progressing Patient can get up to bedside commode, but is very weak.  Problem: Fluid Volume: Goal: Ability to maintain a balanced intake and output will improve Outcome: Not Progressing Patient is NPO.  Problem: Nutrition: Goal: Adequate nutrition will be maintained Outcome: Not Progressing Patient is NPO.

## 2016-10-13 NOTE — Progress Notes (Signed)
Speech Language Pathology Treatment: Dysphagia  Patient Details Name: [name] MRN: [id number] DOB: 1971-03-10 Today's Date: 10/13/2016 Time: 8891-6945 SLP Time Calculation (min) (ACUTE ONLY): 30 min  Assessment / Plan / Recommendation Clinical Impression  Pt appears much improved in her alertness; fully awake now and conversing at conversational level w/ SLP and Staff. Pt is tolerating her regular consistency diet (though BRAT currently per GI order) of thin liquids and solid foods w/ no overt s/s of aspiration noted by Staff/NSG; no oral phase deficits noted. Pt requires min assistance for setup and positioning upright in bed for eating meals. Pt stated she was "much better now". Pt swallowed pills whole w/ sips of water w/ NSG this session w/ no overt s/s of aspiration. Educated on use of aspiration precautions and meds in puree if easier for swallowing. As pt appears to be tolerating a regular diet w/out deficits and at/close to her baseline for swallowing and communication w/ others, no further skilled ST services indicated at this time. NSG to reconsult if any change/decline in current status occurs while still admitted. MD updated. NSG agreed.   HPI HPI: Pt has had an extended hospital stay w/ multiple medical issues but more alert, awake and conversing w/ Staff appropriately. She answers questions appropriately and converses w/ MD/NSG re: her status now. Pt is experiencing loose stools, rectal tube is replaced, C. difficile was tested and negative. Gastrointestinal panel is negative as well. Initiated on cholestyramine and mesalamine with no significant improvement in stool consistency. The patient was seen by gastroenterologist and was initiated on [place] since there was a concern of possible high fructose corn and milk product consumption. GI continues to follow. NSG reported pt has been tolerating her BRAT diet w/ no overt s/s fo aspiration noted during intake at meals or when  swallowing pills w/ water. Pt can feed self w/ setup assistance.       SLP Plan  All goals met     Recommendations  Diet recommendations: Regular;Thin liquid Liquids provided via: Cup;Straw (monitor straw use and do not use if coughing noted) Medication Administration: Whole meds with liquid (or whole w/ Puree if easier for swallowing) Supervision: Patient able to self feed;Intermittent supervision to cue for compensatory strategies Compensations: Minimize environmental distractions;Slow rate;Small sips/bites;Follow solids with liquid Postural Changes and/or Swallow Maneuvers: Seated upright 90 degrees;Upright 30-60 min after meal                General recommendations:  (Dietician following) Oral Care Recommendations: Oral care BID;Staff/trained caregiver to provide oral care Follow up Recommendations: None Plan: All goals met       GO               Orinda Kenner, MS, CCC-SLP [name] 10/13/2016, 10:50 AM

## 2016-10-13 NOTE — Progress Notes (Signed)
Speech Language Pathology Treatment: Dysphagia  Patient Details Name: [name] MRN: [id number] DOB: May 25, 1971 Today's Date: 10/13/2016 Time: 0930-1000 SLP Time Calculation (min) (ACUTE ONLY): 30 min  Assessment / Plan / Recommendation Clinical Impression  Pt appears much improved in her alertness; fully awake now and conversing at conversational level w/ SLP and Staff. Pt appeared to adequately tolerate trials of mech soft/regular consistency foods and trials of thin liquids w/ no overt s/s of aspiration noted; no oral phase deficits noted as pt cleared orally w/ only min extra time. Pt requires min assistance for setup and positioning upright in bed for eating meals. Pt stated she was "feeling better now". Educated on use of aspiration precautions and meds in puree if easier for swallowing. Pt is being followed by Gi d/t diarrhea and a modification to the food choices may occur per GI. Informed MD that pt could upgrade to a regular consistency diet though. MD agreed. ST will f/u another session post upgrade. NSG agreed.    HPI HPI: Pt has had an extended hospital stay w/ multiple medical issues but more alert, awake and conversing w/ Staff appropriately. She answers questions appropriately and converses w/ MD/NSG re: her status now. Pt is experiencing loose stools, rectal tube is replaced, C. difficile was tested and negative. Gastrointestinal panel is negative as well. Initiated on cholestyramine and mesalamine with no significant improvement in stool consistency. The patient was seen by gastroenterologist and was initiated on [place] since there was a concern of possible high fructose corn and milk product consumption. GI continues to follow. NSG reported pt has been tolerating her po diet w/ no overt s/s fo aspiration noted during intake at meals or when swallowing pills w/ water. Pt can feed self w/ setup assistance.       SLP Plan  Continue with current plan of care      Recommendations  Diet recommendations: Regular;Thin liquid Liquids provided via: Cup;Straw (no straw if coughing noted during use) Medication Administration: Whole meds with liquid (use of puree for easier swallowing if needed) Supervision: Patient able to self feed;Intermittent supervision to cue for compensatory strategies Compensations: Minimize environmental distractions;Slow rate;Small sips/bites;Lingual sweep for clearance of pocketing;Follow solids with liquid Postural Changes and/or Swallow Maneuvers: Seated upright 90 degrees;Upright 30-60 min after meal                General recommendations:  (Dietician following) Oral Care Recommendations: Oral care BID;Staff/trained caregiver to provide oral care Follow up Recommendations:  (TBD) Plan: Continue with current plan of care       [place], [place], CCC-SLP Watson,Katherine 10/13/2016, 11:13 AM

## 2016-10-13 NOTE — Progress Notes (Signed)
[place] [facility] [place] NAME: [name]    MR#:  [id number]  DATE OF BIRTH:  1970-12-11  SUBJECTIVE:  CHIEF COMPLAINT:  No chief complaint on file.  The patient has no complaints, she is more alert, answers some questions appropriately. Loose stools, rectal tube is replaced, C. difficile was tested and negative. Gastrointestinal panel is negative as well. Initiated on cholestyramine and mesalamine with no significant improvement in stool consistency. The patient was seen by gastroenterologist and was initiated on [place] since there was a concern of possible high fructose corn and milk product consumption. Diarrheal stool is subsiding. Get cardiologist recommends colonoscopy with biopsies. Patient underwent PermCath placement today for worsening creatinine, came back from specials hypotensive with systolic blood pressure in 90s, requiring IV fluid administration REVIEW OF SYSTEMS:  Review of Systems  Unable to perform ROS: Mental status change  Constitutional: Negative for chills, fever and weight loss.  HENT: Negative for congestion.   Eyes: Negative for blurred vision and double vision.  Respiratory: Negative for cough, sputum production, shortness of breath and wheezing.   Cardiovascular: Negative for chest pain, palpitations, orthopnea, leg swelling and PND.  Gastrointestinal: Positive for diarrhea. Negative for abdominal pain, blood in stool, constipation, nausea and vomiting.  Genitourinary: Negative for dysuria, frequency, hematuria and urgency.  Musculoskeletal: Negative for falls.  Neurological: Negative for dizziness, tremors, focal weakness and headaches.  Endo/Heme/Allergies: Does not bruise/bleed easily.  Psychiatric/Behavioral: Negative for depression. The patient does not have insomnia.   Patient is confused, although knows that she is in the hospital  DRUG ALLERGIES:   Allergies  Allergen Reactions  . Peanut Oil Other (See  Comments)    Face turns red  . Risperidone And Related Cough   VITALS:  Blood pressure (!) 90/46, pulse 94, temperature 98.4 F (36.9 C), temperature source Oral, resp. rate 16, height 5\' 6"  (1.676 m), weight 120.1 kg (264 lb 12.8 oz), SpO2 96 %. PHYSICAL EXAMINATION:  Physical Exam  Constitutional: She is well-developed, well-nourished, and in no distress.  Morbid obesity.  HENT:  Head: Normocephalic.  Mouth/Throat: Oropharynx is clear and moist.  Eyes: Conjunctivae and EOM are normal.  Neck: Normal range of motion. Neck supple. No JVD present. No tracheal deviation present.  Cardiovascular: Normal rate, regular rhythm and normal heart sounds.   Pulmonary/Chest: Effort normal and breath sounds normal. No respiratory distress. She has no wheezes. She has no rales.  Abdominal: Soft. Bowel sounds are normal. She exhibits no distension. There is no tenderness.  Musculoskeletal: She exhibits no edema or tenderness.  Neurological: She is alert. No cranial nerve deficit.  Confused but follow commands.  Skin: No rash noted. No erythema.   LABORATORY PANEL:   CBC  Recent Labs Lab 10/12/16 0440 10/13/16 0514  WBC 10.4  --   HGB 7.0* 6.0*  HCT 20.3*  --   PLT 433  --    ------------------------------------------------------------------------------------------------------------------ Chemistries   Recent Labs Lab 10/13/16 0514  NA 133*  K 3.2*  CL 102  CO2 20*  GLUCOSE 85  BUN 63*  CREATININE 6.64*  CALCIUM 7.3*   RADIOLOGY:  No results found. ASSESSMENT AND PLAN:   45 yo obese white female admitted to the ICU for septic shock?UTI with severe metabolic acidosis, acute encephalopathy and acute respiratory failure;  Status post extubation. ARF on CRRT. All cultures are negative so far.  Acute respiratory failure with hypoxia--improved. Extubated 12/1 off nasal cannula, now on room air  with O2 sats of 98%  Severe septic Versus hypovolemic shock, blood pressure remains  low, Off vasopressor. She has been treated with Zosyn for 10 days, now off antibiotics. Also off steroids, may need to initiate stress doses of steroids if blood pressure does not respond to IV fluids. ECHO with normal LVEF of approximately 60% The patient continues to have diarrheal stool.  May need to initiate on IV fluids.   AKI possibly related to severe sepsis s/p  CRRT, also started on albumin by nephrology. PermCath was placed 14th of December 2017 by Dr. dew to continue dialysis, as creatinine is critically high, patient continues to have some  urinary output, patient's urinary output was 575 cc over the past 24 hours.  The  patient continues to lose some  fluids with diarrheal stool, although stool output has significantly subsided, per nursing staff. Follow closely and initiate IV fluids if needed  Hyperkalemia-resolved Hypocalcemia, likely alimental Hypophosphatemia, resolved   Leukocytosis, likely due to steroids, resolved  Acute encephalopathy, delirium related to severe sepsis, improved .  Severe agitation with underlying schizophrenia. -Psychiatry following, now on Zyprexa and clonazepam, supportive counseling and encouragement was provided by psychiatrist. Patient seemed to be responding to intervention, she is calmer and more comfortable.    Diarrhea, C. difficile negative, gastrointestinal panel is negative, history of Crohn's disease, not improving on   Mesalamine,  appreciate gastroenterologist  recommendations, now on BRAT diet, will need endoscopy and biopsies  Anemia of chronic disease, iron studies revealed low or if consideration, but high ferritin level,, no obvious bleeding noted, patient will need to be transfused as remains hypotensive, we will obtain transfusion consent from patient's aunt.    All the records are reviewed and case discussed with Care Management/Social Worker. Management plans discussed with the patient's aunt, and they are in agreement.  CODE  STATUS: Full code  TOTAL TIME TAKING CARE OF THIS PATIENT: 35 minutes.   More than 50% of the time was spent in counseling/coordination of care: YES  POSSIBLE D/C IN >3 DAYS, DEPENDING ON CLINICAL CONDITION.   Theodoro Grist M.D on 10/13/2016 at 2:47 PM  Between 7am to 6pm - Pager - [phone]  After 6pm go to [email or web address] - Proofreader  [facility]  Office  [phone]  CC: Primary care physician; Lorelee Market, MD  Note: This dictation was prepared with Dragon dictation along with smaller phrase technology. Any transcriptional errors that result from this process are unintentional.

## 2016-10-13 NOTE — H&P (Signed)
 [facility] History & Physical Update  The patient was interviewed and re-examined.  The patient's previous History and Physical has been reviewed and is unchanged.  There is no change in the plan of care. We plan to proceed with the scheduled procedure.  Leotis Pain, MD  10/13/2016, 11:42 AM

## 2016-10-14 ENCOUNTER — Inpatient Hospital Stay: Payer: Medicare Other | Admitting: Certified Registered Nurse Anesthetist

## 2016-10-14 ENCOUNTER — Encounter: Payer: Self-pay | Admitting: Vascular Surgery

## 2016-10-14 ENCOUNTER — Encounter
Admission: EM | Disposition: A | Discharge status: Skilled Nursing Facility | Payer: Self-pay | Source: Home / Self Care | Attending: Internal Medicine

## 2016-10-14 DIAGNOSIS — K626 Ulcer of anus and rectum: Secondary | ICD-10-CM

## 2016-10-14 DIAGNOSIS — K633 Ulcer of intestine: Secondary | ICD-10-CM

## 2016-10-14 HISTORY — PX: COLONOSCOPY WITH PROPOFOL: SHX5780

## 2016-10-14 LAB — CBC WITH DIFFERENTIAL/PLATELET
BAND NEUTROPHILS: 4 %
BASOS ABS: 0 10*3/uL (ref 0–0.1)
BASOS PCT: 0 %
BLASTS: 0 %
EOS ABS: 0 10*3/uL (ref 0–0.7)
EOS PCT: 0 %
HCT: 22 % — ABNORMAL LOW (ref 35.0–47.0)
Hemoglobin: 7.3 g/dL — ABNORMAL LOW (ref 12.0–16.0)
LYMPHS ABS: 3.6 10*3/uL (ref 1.0–3.6)
LYMPHS PCT: 36 %
MCH: 30 pg (ref 26.0–34.0)
MCHC: 33.3 g/dL (ref 32.0–36.0)
MCV: 90 fL (ref 80.0–100.0)
METAMYELOCYTES PCT: 4 %
MONO ABS: 1.1 10*3/uL — AB (ref 0.2–0.9)
MONOS PCT: 11 %
Myelocytes: 1 %
NEUTROS ABS: 5.4 10*3/uL (ref 1.4–6.5)
Neutrophils Relative %: 44 %
OTHER: 0 %
PLATELETS: 518 10*3/uL — AB (ref 150–440)
Promyelocytes Absolute: 0 %
RBC: 2.44 MIL/uL — ABNORMAL LOW (ref 3.80–5.20)
RDW: 15 % — AB (ref 11.5–14.5)
Smear Review: ADEQUATE
WBC: 10.1 10*3/uL (ref 3.6–11.0)
nRBC: 1 /100 WBC — ABNORMAL HIGH

## 2016-10-14 LAB — BASIC METABOLIC PANEL
Anion gap: 10 (ref 5–15)
BUN: 58 mg/dL — AB (ref 6–20)
CALCIUM: 7.5 mg/dL — AB (ref 8.9–10.3)
CO2: 20 mmol/L — AB (ref 22–32)
Chloride: 109 mmol/L (ref 101–111)
Creatinine, Ser: 6.59 mg/dL — ABNORMAL HIGH (ref 0.44–1.00)
GFR calc Af Amer: 8 mL/min — ABNORMAL LOW (ref >60)
GFR, EST NON AFRICAN AMERICAN: 7 mL/min — AB (ref >60)
GLUCOSE: 88 mg/dL (ref 65–99)
Potassium: 3.2 mmol/L — ABNORMAL LOW (ref 3.5–5.1)
Sodium: 139 mmol/L (ref 135–145)

## 2016-10-14 LAB — MAGNESIUM: Magnesium: 1.9 mg/dL (ref 1.7–2.4)

## 2016-10-14 LAB — HEMOGLOBIN AND HEMATOCRIT, BLOOD
HCT: 25.7 % — ABNORMAL LOW (ref 35.0–47.0)
HEMATOCRIT: 21.9 % — AB (ref 35.0–47.0)
Hemoglobin: 7.5 g/dL — ABNORMAL LOW (ref 12.0–16.0)
Hemoglobin: 8.8 g/dL — ABNORMAL LOW (ref 12.0–16.0)

## 2016-10-14 LAB — GLUCOSE, CAPILLARY
GLUCOSE-CAPILLARY: 92 mg/dL (ref 65–99)
GLUCOSE-CAPILLARY: 117 mg/dL — AB (ref 65–99)
GLUCOSE-CAPILLARY: 131 mg/dL — AB (ref 65–99)
Glucose-Capillary: 87 mg/dL (ref 65–99)

## 2016-10-14 LAB — CORTISOL: CORTISOL PLASMA: 22.3 ug/dL

## 2016-10-14 LAB — PREPARE RBC (CROSSMATCH)

## 2016-10-14 SURGERY — COLONOSCOPY WITH PROPOFOL
Anesthesia: General

## 2016-10-14 MED ORDER — POTASSIUM CHLORIDE CRYS ER 20 MEQ PO TBCR
40.0000 meq | EXTENDED_RELEASE_TABLET | Freq: Once | ORAL | Status: AC
  Administered 2016-10-14: 40 meq via ORAL
  Filled 2016-10-14: qty 2

## 2016-10-14 MED ORDER — LEVOTHYROXINE SODIUM 50 MCG PO TABS
50.0000 ug | ORAL_TABLET | Freq: Every day | ORAL | Status: DC
  Administered 2016-10-15 – 2016-10-18 (×4): 50 ug via ORAL
  Filled 2016-10-14 (×4): qty 1

## 2016-10-14 MED ORDER — SIMVASTATIN 20 MG PO TABS
20.0000 mg | ORAL_TABLET | Freq: Every day | ORAL | Status: DC
  Administered 2016-10-14 – 2016-10-17 (×4): 20 mg via ORAL
  Filled 2016-10-14 (×4): qty 1

## 2016-10-14 MED ORDER — PREDNISONE 20 MG PO TABS
40.0000 mg | ORAL_TABLET | Freq: Every day | ORAL | Status: DC
  Administered 2016-10-15 – 2016-10-18 (×4): 40 mg via ORAL
  Filled 2016-10-14 (×6): qty 2

## 2016-10-14 MED ORDER — SODIUM CHLORIDE 0.9 % IV SOLN: Freq: Once | INTRAVENOUS | Status: DC

## 2016-10-14 MED ORDER — MESALAMINE 4 G RE ENEM
4.0000 g | ENEMA | Freq: Every day | RECTAL | Status: DC
  Administered 2016-10-14 – 2016-10-17 (×4): 4 g via RECTAL
  Filled 2016-10-14 (×7): qty 60

## 2016-10-14 MED ORDER — LIDOCAINE HCL (CARDIAC) 20 MG/ML IV SOLN
INTRAVENOUS | Status: DC | PRN
  Administered 2016-10-14: 30 mg via INTRAVENOUS

## 2016-10-14 MED ORDER — PROPOFOL 10 MG/ML IV BOLUS
INTRAVENOUS | Status: DC | PRN
  Administered 2016-10-14: 10 mg via INTRAVENOUS
  Administered 2016-10-14: 30 mg via INTRAVENOUS

## 2016-10-14 NOTE — Progress Notes (Signed)
HD COMPLETED  

## 2016-10-14 NOTE — Brief Op Note (Signed)
Colonoscopy complete to proximal descending colon, due to poor prep and severe colitis

## 2016-10-14 NOTE — Anesthesia Postprocedure Evaluation (Addendum)
Anesthesia Post Note  Patient: [name]  Procedure(s) Performed: Procedure(s) (LRB): COLONOSCOPY WITH PROPOFOL (N/A)  Patient location during evaluation: Endoscopy Anesthesia Type: General Level of consciousness: awake and alert Pain management: pain level controlled Vital Signs Assessment: post-procedure vital signs reviewed and stable Respiratory status: spontaneous breathing, nonlabored ventilation, respiratory function stable and patient connected to nasal cannula oxygen Cardiovascular status: blood pressure returned to baseline and stable Postop Assessment: no signs of nausea or vomiting Anesthetic complications: no    Last Vitals:  Vitals:   10/14/16 0943 10/14/16 1024  BP: (!) 95/55 (!) 95/50  Pulse: 94   Resp: 18   Temp:  36.3 C    Last Pain:  Vitals:   10/14/16 1024  TempSrc: Oral  PainSc:                  Martha Clan

## 2016-10-14 NOTE — Op Note (Signed)
[facility] Patient Name: [name] Procedure Date: 10/14/2016 8:48 AM MRN: [id number] Account #: [id number] Date of Birth: December 12, 1970 Admit Type: Inpatient Age: 45 Room: [facility] ENDO ROOM 1 Gender: Female Note Status: Finalized Procedure:            Colonoscopy Indications:          Diarrhea Providers:            Jonathon Bellows MD, MD Referring MD:         Meindert A. Brunetta Genera, MD (Referring MD) Medicines:            Monitored Anesthesia Care Complications:        No immediate complications. Procedure:            Pre-Anesthesia Assessment:                       - Prior to the procedure, a History and Physical was                        performed, and patient medications, allergies and                        sensitivities were reviewed. The patient's tolerance of                        previous anesthesia was reviewed.                       - The risks and benefits of the procedure and the                        sedation options and risks were discussed with the                        patient. All questions were answered and informed                        consent was obtained.                       - The risks and benefits of the procedure and the                        sedation options and risks were discussed with the                        patient. All questions were answered and informed                        consent was obtained.                       - ASA Grade Assessment: III - A patient with severe                        systemic disease.                       After obtaining informed consent, the colonoscope was                        passed  under direct vision. Throughout the procedure,                        the patient's blood pressure, pulse, and oxygen                        saturations were monitored continuously. The                        Colonoscope was introduced through the anus and                        advanced to the the  splenic flexure. The colonoscopy                        was performed with ease. The patient tolerated the                        procedure well. The quality of the bowel preparation                        was poor. Findings:      The perianal and digital rectal examinations were normal.      A patchy area of moderately altered vascular, atrophic, congested,       pseudopolypoid, scarred, ulcerated and vascular-pattern-decreased mucosa       was found in the descending colon. Biopsies were taken with a cold       forceps for histology.      A patchy area of moderately altered vascular, atrophic, congested,       nodular, pseudopolypoid, scarred, ulcerated and       vascular-pattern-decreased mucosa was found in the sigmoid colon.       Biopsies were taken with a cold forceps for histology.      A patchy area of moderately altered vascular, atrophic, congested,       friable (with contact bleeding), plaque covered, pseudopolypoid,       scarred, ulcerated and vascular-pattern-decreased mucosa was found in       the rectum. Biopsies were taken with a cold forceps for histology. Impression:           - Preparation of the colon was poor.                       - Altered vascular, atrophic, congested,                        pseudopolypoid, scarred, ulcerated and                        vascular-pattern-decreased mucosa in the descending                        colon. Biopsied.                       - Altered vascular, atrophic, congested, nodular,                        pseudopolypoid, scarred, ulcerated and                        vascular-pattern-decreased mucosa in the sigmoid  colon.                        Biopsied.                       - Altered vascular, atrophic, congested, friable (with                        contact bleeding), plaque covered, pseudopolypoid,                        scarred, ulcerated and vascular-pattern-decreased                        mucosa in the rectum.  Biopsied. Recommendation:       - Return patient to hospital ward for ongoing care.                       - Resume previous diet.                       - Continue present medications.                       - Await pathology results.                       - No aspirin, ibuprofen, naproxen, or other                        non-steroidal anti-inflammatory drugs.                       - Use Rowasa enemas 1 per rectum daily for 4 weeks.                       - Taper prednisone.                       - Use prednisone 40 mg PO once a day for 4 weeks.                       - Please commence on prednisone 40 mg daily with taper                        of 5 mg every week provided no other medical                        contrindications. At discharge she will need follow up                        with Dr Gustavo Lah for stepping up of treatment for IBD,                        may benefit from [facility]. Procedure Code(s):    --- Professional ---                       [id number], 39, Colonoscopy, flexible; with biopsy, single                        or multiple Diagnosis Code(s):    ---  Professional ---                       K63.3, Ulcer of intestine                       K63.89, Other specified diseases of intestine                       K62.5, Hemorrhage of anus and rectum                       K62.6, Ulcer of anus and rectum                       K62.89, Other specified diseases of anus and rectum                       R19.7, Diarrhea, unspecified CPT copyright 2016 American Medical Association. All rights reserved. The codes documented in this report are preliminary and upon coder review may  be revised to meet current compliance requirements. Jonathon Bellows, MD Jonathon Bellows MD, MD 10/14/2016 9:10:01 AM This report has been signed electronically. Number of Addenda: 0 Note Initiated On: 10/14/2016 8:48 AM Total Procedure Duration: 0 hours 9 minutes 8 seconds       [facility]

## 2016-10-14 NOTE — Progress Notes (Signed)
POST DIALYSIS ASSESSMENT 

## 2016-10-14 NOTE — Progress Notes (Signed)
MEDICATION RELATED CONSULT NOTE - INITIAL   Pharmacy Consult for Clozapine monitoring  Indication: clozapine   Allergies  Allergen Reactions  . Peanut Oil Other (See Comments)    Face turns red  . Risperidone And Related Cough   Patient Measurements: Height: 5\' 6"  (167.6 cm) Weight: 259 lb 4.8 oz (117.6 kg) IBW/kg (Calculated) : 59.3  Vital Signs: Temp: 98.4 F (36.9 C) (12/15 0913) Temp Source: Tympanic (12/15 0913) BP: 95/55 (12/15 0943) Pulse Rate: 94 (12/15 0943) Intake/Output from previous day: 12/14 0701 - 12/15 0700 In: 1216 [P.O.:360; I.V.:500; Blood:356] Out: -  Intake/Output from this shift: Total I/O In: 100 [I.V.:100] Out: 0   Labs:  Recent Labs  10/12/16 0440 10/13/16 0514 10/14/16 0127 10/14/16 0448  WBC 10.4  --   --  10.1  HGB 7.0* 6.0* 7.5* 7.3*  HCT 20.3*  --  21.9* 22.0*  PLT 433  --   --  518*  CREATININE 6.68* 6.64*  --  6.59*  PHOS 5.3*  --   --   --   ALBUMIN 2.0*  --   --   --    Estimated Creatinine Clearance: 14.1 mL/min (by C-G formula based on SCr of 6.59 mg/dL (H)).  Assessment: Pharmacy consulted to monitor [place] in this patient receiving clozapine. Psychiatry is consulted and following inpatient.  Plan:  [place] on 12/15 = 5.4. Labs entered into clozapine registry and patient is eligible to continue receiving clozapine.   Will recheck [place] on 10/21/16.  Napoleon Form, PharmD Clinical Pharmacist 10/14/2016,10:11 AM

## 2016-10-14 NOTE — Addendum Note (Signed)
Addendum  created 10/14/16 1121 by Martha Clan, MD   Sign clinical note

## 2016-10-14 NOTE — Progress Notes (Signed)
Planned for colonoscopy , reached splenic flexure , very poor prep , skipp areas of ulceration , severe mucosal edema, scarring, pseudopolyps seen. The mucosa was very friable and pale. Multiple biospies taken.  Very likely moderate to severe crohns colitis based on history .   Plan  1.Suggest adding Rowasa enemas once at night daily 2. Commence on prednisone 40 mg once daily with a taper of 5 mg every week if ok from infection point of view 3. As an out patient she will need follow up with Dr Gustavo Lah to consider stepping up treatment for her crohns with consideration to use Stelara as the humira as clearly failed.   Dr Jonathon Bellows  Gastroenterology/Hepatology Pager: [phone]

## 2016-10-14 NOTE — Progress Notes (Signed)
PT Cancellation Note  Patient Details Name: [name] MRN: [id number] DOB: 09-04-1971   Cancelled Treatment:    Reason Eval/Treat Not Completed: Patient at procedure or test/unavailable (at HD).  PT will continue to follow acutely.   Collie Siad PT, DPT 10/14/2016, 2:47 PM

## 2016-10-14 NOTE — Progress Notes (Signed)
[place] [facility] [place] NAME: [name]    MR#:  [id number]  DATE OF BIRTH:  02-Nov-1970  SUBJECTIVE:  CHIEF COMPLAINT:  No chief complaint on file.  The patient has no complaints, she is more alert, answers some questions appropriately. Loose stools, rectal tube is replaced, C. difficile was tested and negative. Gastrointestinal panel is negative as well. Initiated on cholestyramine and mesalamine with no significant improvement in stool consistency. The patient was seen by gastroenterologist and was initiated on [place] since there was a concern of possible high fructose corn and milk product consumption. Diarrheal stool has not subsided with BRAT diet. Anoscopy was performed today, reached, splenic flexure, mucosa was found to be friable, prednisone orally was recommended as well as mesalamine enemas REVIEW OF SYSTEMS:  Review of Systems  Unable to perform ROS: Mental status change  Constitutional: Negative for chills, fever and weight loss.  HENT: Negative for congestion.   Eyes: Negative for blurred vision and double vision.  Respiratory: Negative for cough, sputum production, shortness of breath and wheezing.   Cardiovascular: Negative for chest pain, palpitations, orthopnea, leg swelling and PND.  Gastrointestinal: Positive for diarrhea. Negative for abdominal pain, blood in stool, constipation, nausea and vomiting.  Genitourinary: Negative for dysuria, frequency, hematuria and urgency.  Musculoskeletal: Negative for falls.  Neurological: Negative for dizziness, tremors, focal weakness and headaches.  Endo/Heme/Allergies: Does not bruise/bleed easily.  Psychiatric/Behavioral: Negative for depression. The patient does not have insomnia.   Patient is confused, although knows that she is in the hospital  DRUG ALLERGIES:   Allergies  Allergen Reactions  . Peanut Oil Other (See Comments)    Face turns red  . Risperidone And Related  Cough   VITALS:  Blood pressure 99/62, pulse 87, temperature 99.1 F (37.3 C), temperature source Oral, resp. rate 20, height 5\' 6"  (1.676 m), weight 117.1 kg (258 lb 2.5 oz), SpO2 99 %. PHYSICAL EXAMINATION:  Physical Exam  Constitutional: She is well-developed, well-nourished, and in no distress.  Morbid obesity.  HENT:  Head: Normocephalic.  Mouth/Throat: Oropharynx is clear and moist.  Eyes: Conjunctivae and EOM are normal.  Neck: Normal range of motion. Neck supple. No JVD present. No tracheal deviation present.  Cardiovascular: Normal rate, regular rhythm and normal heart sounds.   Pulmonary/Chest: Effort normal and breath sounds normal. No respiratory distress. She has no wheezes. She has no rales.  Abdominal: Soft. Bowel sounds are normal. She exhibits no distension. There is no tenderness.  Musculoskeletal: She exhibits no edema or tenderness.  Neurological: She is alert. No cranial nerve deficit.  Confused but follow commands.  Skin: No rash noted. No erythema.   LABORATORY PANEL:   CBC  Recent Labs Lab 10/14/16 0448  WBC 10.1  HGB 7.3*  HCT 22.0*  PLT 518*   ------------------------------------------------------------------------------------------------------------------ Chemistries   Recent Labs Lab 10/14/16 0448  NA 139  K 3.2*  CL 109  CO2 20*  GLUCOSE 88  BUN 58*  CREATININE 6.59*  CALCIUM 7.5*  MG 1.9   RADIOLOGY:  No results found. ASSESSMENT AND PLAN:   45 yo obese white female admitted to the ICU for septic shock?UTI with severe metabolic acidosis, acute encephalopathy and acute respiratory failure;  Status post extubation. ARF on CRRT. All cultures are negative so far.  Acute respiratory failure with hypoxia--improved. Extubated 12/1 off nasal cannula, now on room air with O2 sats of 98%  Severe septic Versus hypovolemic shock, blood pressure  remains low, Off vasopressor. She has been treated with Zosyn for 10 days, now off antibiotics.  Also off steroids, cortisone level today is normal at 22, patient is going to be transfused 1 packed red blood cells to maintain blood pressure ECHO with normal LVEF of approximately 60% The patient continues to have diarrheal stool.  May need to initiate on IV fluids.   AKI possibly related to severe sepsis s/p  CRRT, also started on albumin by nephrology. PermCath was placed 14th of December 2017 by Dr. Lucky Cowboy to continue dialysis, as creatinine is critically high, patient continues to have some  urinary output, , however, the patient continues to lose some  fluids with diarrheal stool, although stool output has subsided, per nursing staff. Follow closely and initiate IV fluids if needed. Patient had dialysis today, Dr. Candiss Norse is following closely.   Hyperkalemia-resolved Hypocalcemia, likely alimental Hypophosphatemia, resolved   Leukocytosis, likely due to steroids, resolved  Acute encephalopathy, delirium related to severe sepsis, resolved.  Severe agitation with underlying schizophrenia. -Psychiatry following, now on Zyprexa and clonazepam, supportive counseling and encouragement was provided by psychiatrist. Patient seemed to be responding to medical intervention, she is calmer and more cooperative.    Diarrhea due to severe Crohn's colitis, status post colonoscopy 15th of December 2017 by Dr. [name], C. difficile negative, gastrointestinal panel is negative, initiate patient on prednisone orally, continue  Mesalamine orally and enemas,  appreciate gastroenterologist  input, now on BRAT diet. Dr. [name] feels that patient would need to follow-up with Dr. Gustavo Lah to consider stepping up therapy for Crohn's with consideration of Stelara use, as he felt that Humira clearly failed.   Anemia of chronic disease, iron studies revealed low or if consideration, but high ferritin level,, no obvious bleeding noted, transfuse 1 unit of packed cell blood cells today, follow hemoglobin level in the morning,  patient is agreeable to transfusion, discussed benefits and risks  All the records are reviewed and case discussed with Care Management/Social Worker. Management plans discussed with the patient's aunt, and they are in agreement.  CODE STATUS: Full code  TOTAL TIME TAKING CARE OF THIS PATIENT: 35 minutes.   More than 50% of the time was spent in counseling/coordination of care: YES  POSSIBLE D/C IN >3 DAYS, DEPENDING ON CLINICAL CONDITION.   Theodoro Grist M.D on 10/14/2016 at 3:56 PM  Between 7am to 6pm - Pager - [phone]  After 6pm go to [email or web address] - Proofreader  [facility]  Office  [phone]  CC: Primary care physician; Lorelee Market, MD  Note: This dictation was prepared with Dragon dictation along with smaller phrase technology. Any transcriptional errors that result from this process are unintentional.

## 2016-10-14 NOTE — Progress Notes (Signed)
PRE DIALYSIS ASSESSMENT 

## 2016-10-14 NOTE — Anesthesia Procedure Notes (Signed)
Date/Time: 10/14/2016 8:49 AM Performed by: Johnna Acosta Pre-anesthesia Checklist: Patient identified, Emergency Drugs available, Suction available, Patient being monitored and Timeout performed Patient Re-evaluated:Patient Re-evaluated prior to inductionOxygen Delivery Method: Nasal cannula

## 2016-10-14 NOTE — Anesthesia Preprocedure Evaluation (Signed)
Anesthesia Evaluation  Patient identified by MRN, date of birth, ID band Patient awake    Reviewed: Allergy & Precautions, NPO status , Patient's Chart, lab work & pertinent test results, reviewed documented beta blocker date and time   History of Anesthesia Complications Negative for: history of anesthetic complications  Airway Mallampati: III  TM Distance: >3 FB     Dental  (+) Chipped   Pulmonary neg pulmonary ROS,           Cardiovascular Exercise Tolerance: Poor hypertension, Pt. on medications (-) angina(-) CAD, (-) Past MI, (-) Cardiac Stents and (-) CABG (-) dysrhythmias (-) Valvular Problems/Murmurs     Neuro/Psych neg Seizures PSYCHIATRIC DISORDERS Depression Schizophrenia  Neuromuscular disease    GI/Hepatic Neg liver ROS, hiatal hernia, GERD  ,  Endo/Other  diabetes, Type 2Hypothyroidism Morbid obesity  Renal/GU      Musculoskeletal   Abdominal   Peds  Hematology negative hematology ROS (+)   Anesthesia Other Findings Crohns. TBI. Obese.  Reproductive/Obstetrics negative OB ROS                             Anesthesia Physical  Anesthesia Plan  ASA: III  Anesthesia Plan: General   Post-op Pain Management:    Induction:   Airway Management Planned: Nasal Cannula  Additional Equipment:   Intra-op Plan:   Post-operative Plan:   Informed Consent: I have reviewed the patients History and Physical, chart, labs and discussed the procedure including the risks, benefits and alternatives for the proposed anesthesia with the patient or authorized representative who has indicated his/her understanding and acceptance.     Plan Discussed with: CRNA  Anesthesia Plan Comments:         Anesthesia Quick Evaluation

## 2016-10-14 NOTE — Transfer of Care (Signed)
Immediate Anesthesia Transfer of Care Note  Patient: [name]  Procedure(s) Performed: Procedure(s): COLONOSCOPY WITH PROPOFOL (N/A)  Patient Location: PACU  Anesthesia Type:General  Level of Consciousness: sedated  Airway & Oxygen Therapy: Patient Spontanous Breathing and Patient connected to nasal cannula oxygen  Post-op Assessment: Report given to RN and Post -op Vital signs reviewed and stable  Post vital signs: Reviewed and stable  Last Vitals:  Vitals:   10/14/16 0809 10/14/16 0911  BP: (!) 108/50 (!) 90/50  Pulse: 94 90  Resp: 17 16  Temp: 37.3 C (!) 36 C    Last Pain:  Vitals:   10/14/16 0911  TempSrc: Tympanic  PainSc:       Patients Stated Pain Goal: 0 ([id number])  Complications: No apparent anesthesia complications

## 2016-10-14 NOTE — Progress Notes (Signed)
[facility]  ROUNDING NOTE   Subjective:   Patient is overall doing fair Appropriate diet is being addressed for her condition Serum creatinine high again today at 6.6, BUN 58, GFR 7 Phosphorus high at 5.3 and albumin is low at 2.0 Hemoglobin also remains low 7.3 this morning after blood transfusion    Objective:  Vital signs in last 24 hours:  Temp:  [96.8 F (36 C)-99.1 F (37.3 C)] 97.7 F (36.5 C) (12/15 1238) Pulse Rate:  [56-94] 88 (12/15 1238) Resp:  [16-22] 17 (12/15 1238) BP: (86-108)/(34-56) 101/56 (12/15 1238) SpO2:  [94 %-100 %] 99 % (12/15 1238) Weight:  [117.6 kg (259 lb 4.8 oz)] 117.6 kg (259 lb 4.8 oz) (12/15 0500)  Weight change: -2.495 kg (-5 lb 8 oz) Filed Weights   10/12/16 0426 10/13/16 0401 10/14/16 0500  Weight: 121.8 kg (268 lb 9.6 oz) 120.1 kg (264 lb 12.8 oz) 117.6 kg (259 lb 4.8 oz)    Intake/Output: I/O last 3 completed shifts: In: 22 [P.O.:480; I.V.:510; Blood:356] Out: -    Intake/Output this shift:  Total I/O In: 100 [I.V.:100] Out: 0   Physical Exam: General: No acute distress  Head: OM moist  Eyes: Anicteric  Neck: Supple, trachea midline  Lungs:  clear, normal effort   Heart: S1S2 No rubs   Abdomen:  Soft, non tender  Extremities: Trace peripheral edema.  Neurologic: Alert, able to answer questions  Skin: No lesions  Access: Right IJ PermCath    Basic Metabolic Panel:  Recent Labs Lab 10/09/16 0513 10/10/16 0625 10/12/16 0440 10/13/16 0514 10/14/16 0448  NA 136 137 135 133* 139  K 3.2* 3.2* 3.6 3.2* 3.2*  CL 102 103 104 102 109  CO2 28 26 22  20* 20*  GLUCOSE 99 93 92 85 88  BUN 32* 42* 61* 63* 58*  CREATININE 4.71* 5.60* 6.68* 6.64* 6.59*  CALCIUM 7.5* 7.6* 7.8* 7.3* 7.5*  PHOS 4.5 4.4 5.3*  --   --     Liver Function Tests:  Recent Labs Lab 10/09/16 0513 10/10/16 0625 10/12/16 0440  ALBUMIN 1.9* 1.9* 2.0*   No results for input(s): LIPASE, AMYLASE in the last 168 hours. No results  for input(s): AMMONIA in the last 168 hours.  CBC:  Recent Labs Lab 10/09/16 0513 10/10/16 0625 10/12/16 0440 10/13/16 0514 10/14/16 0127 10/14/16 0448  WBC 13.9* 13.1* 10.4  --   --  10.1  NEUTROABS  --   --  4.9  --   --  5.4  HGB 7.1* 6.8* 7.0* 6.0* 7.5* 7.3*  HCT 21.3* 20.8* 20.3*  --  21.9* 22.0*  MCV 93.4 93.5 93.0  --   --  90.0  PLT 313 349 433  --   --  518*    Cardiac Enzymes: No results for input(s): CKTOTAL, CKMB, CKMBINDEX, TROPONINI in the last 168 hours.  BNP: Invalid input(s): POCBNP  CBG:  Recent Labs Lab 10/13/16 1445 10/13/16 1636 10/13/16 2115 10/14/16 0723 10/14/16 1126  GLUCAP 87 90 89 92 117*    Microbiology: Results for orders placed or performed during the hospital encounter of 09/27/16  Blood Culture (routine x 2)     Status: None   Collection Time: 09/27/16  6:02 AM  Result Value Ref Range Status   Specimen Description BLOOD  R AC  Final   Special Requests   Final    BOTTLES DRAWN AEROBIC AND ANAEROBIC  AER 3 ML ANA 4 ML   Culture NO GROWTH 5  DAYS  Final   Report Status 10/02/2016 FINAL  Final  Blood Culture (routine x 2)     Status: None   Collection Time: 09/27/16  6:03 AM  Result Value Ref Range Status   Specimen Description BLOOD  R ARM  Final   Special Requests   Final    BOTTLES DRAWN AEROBIC AND ANAEROBIC  AER 6 ML ANA 10 ML   Culture NO GROWTH 5 DAYS  Final   Report Status 10/02/2016 FINAL  Final  Urine culture     Status: None   Collection Time: 09/27/16  9:30 AM  Result Value Ref Range Status   Specimen Description URINE, RANDOM  Final   Special Requests NONE  Final   Culture NO GROWTH Performed at [facility]   Final   Report Status 09/28/2016 FINAL  Final  MRSA PCR Screening     Status: None   Collection Time: 09/27/16 11:09 AM  Result Value Ref Range Status   MRSA by PCR NEGATIVE NEGATIVE Final    Comment:        The GeneXpert MRSA Assay (FDA approved for NASAL specimens only), is one component of  a comprehensive MRSA colonization surveillance program. It is not intended to diagnose MRSA infection nor to guide or monitor treatment for MRSA infections.   C difficile quick scan w PCR reflex     Status: None   Collection Time: 10/04/16 12:20 AM  Result Value Ref Range Status   C Diff antigen NEGATIVE NEGATIVE Final   C Diff toxin NEGATIVE NEGATIVE Final   C Diff interpretation No C. difficile detected.  Final  Gastrointestinal Panel by PCR , Stool     Status: None   Collection Time: 10/08/16  7:00 PM  Result Value Ref Range Status   Campylobacter species NOT DETECTED NOT DETECTED Final   Plesimonas shigelloides NOT DETECTED NOT DETECTED Final   Salmonella species NOT DETECTED NOT DETECTED Final   Yersinia enterocolitica NOT DETECTED NOT DETECTED Final   Vibrio species NOT DETECTED NOT DETECTED Final   Vibrio cholerae NOT DETECTED NOT DETECTED Final   Enteroaggregative E coli (EAEC) NOT DETECTED NOT DETECTED Final   Enteropathogenic E coli (EPEC) NOT DETECTED NOT DETECTED Final   Enterotoxigenic E coli (ETEC) NOT DETECTED NOT DETECTED Final   Shiga like toxin producing E coli (STEC) NOT DETECTED NOT DETECTED Final   Shigella/Enteroinvasive E coli (EIEC) NOT DETECTED NOT DETECTED Final   Cryptosporidium NOT DETECTED NOT DETECTED Final   Cyclospora cayetanensis NOT DETECTED NOT DETECTED Final   Entamoeba histolytica NOT DETECTED NOT DETECTED Final   Giardia lamblia NOT DETECTED NOT DETECTED Final   Adenovirus F40/41 NOT DETECTED NOT DETECTED Final   Astrovirus NOT DETECTED NOT DETECTED Final   Norovirus GI/GII NOT DETECTED NOT DETECTED Final   Rotavirus A NOT DETECTED NOT DETECTED Final   Sapovirus (I, II, IV, and V) NOT DETECTED NOT DETECTED Final    Coagulation Studies: No results for input(s): LABPROT, INR in the last 72 hours.  Urinalysis: No results for input(s): COLORURINE, LABSPEC, PHURINE, GLUCOSEU, HGBUR, BILIRUBINUR, KETONESUR, PROTEINUR, UROBILINOGEN, NITRITE,  LEUKOCYTESUR in the last 72 hours.  Invalid input(s): APPERANCEUR    Imaging: No results found.   Medications:   . sodium chloride 1,000 mL (10/14/16 0827)   . sodium chloride   Intravenous Once  . sodium chloride   Intravenous Once  . bacitracin   Topical BID  . chlorhexidine  15 mL Mouth Rinse BID  . cholestyramine  light  4 g Oral BID  . citalopram  40 mg Oral Daily  . cloZAPine  200 mg Oral Daily  . epoetin (EPOGEN/PROCRIT) injection  10,000 Units Intravenous Once  . famotidine  20 mg Oral Daily  . haloperidol lactate  5 mg Intravenous Once  . heparin  5,000 Units Subcutaneous Q8H  . mesalamine  500 mg Oral TID  . OLANZapine zydis  10 mg Oral BID WC  . sodium chloride flush  10-40 mL Intracatheter Q12H  . tuberculin  5 Units Intradermal Once   sodium chloride, acetaminophen, LORazepam, ondansetron (ZOFRAN) IV  Assessment/ Plan:  45 y.o.white female with a PMHx of Crohn's disease, depression, diabetes mellitus type 2, GERD, hiatal hernia, hyperlipidemia, hypertension, hypothyroidism, obesity, schizophrenia, traumatic brain injury, vitamin D deficiency, who was admitted to [facility] on 09/27/2016   1.  Acute renal failure: CRRT from 11/29 to 12/5. Hemodialysis treatment on 12/8 Acute renal failure sepsis, hypotension, and hypoxemia Presenting creatinine 4.09 on September 19, 2016.  Prior to that, the only known creatinine is 1.16 from January 2014 Serum creatinine remains critically high Today's level is 6.6. Tunnelled dialysis catheter has been placed Possible outpatient dialysis for acute renal failure  Place PPD Will start 1st HD treatment today  2.  Hypokalemia - Likely GI losses - replace as necessary    LOS: 17 [name] 12/15/20172:01 PM

## 2016-10-14 NOTE — Progress Notes (Signed)
1 unit of RBC was given to dialysis nurse Charlie. Start time of blood transfusion [place]

## 2016-10-14 NOTE — H&P (Signed)
Jonathon Bellows MD [address], [place], [place] [address] Phone: [phone] Fax : [phone]  Primary Care Physician:  Lorelee Market, MD Primary Gastroenterologist:  Dr. Jonathon Bellows   Pre-Procedure History & Physical: HPI:  [name] is a 45 y.o. female is here for an colonoscopy.   Past Medical History:  Diagnosis Date  . Abnormal mammogram, unspecified 2013   Prev. cytology,hypercellular smears without evidence of malignant cells. The cytopathologist questioned if samples truly representative. Care taken during sampling and is felt to be representative.  . Breast screening, unspecified 2013  . Broken leg    age 59  . Cellulitis   . Crohn's disease ([place]) 2013  . Depression   . Diabetes mellitus without complication (HCC)    non insulin dependent  . Early menopause   . Edema   . GERD (gastroesophageal reflux disease)   . Hiatal hernia 2013  . Hyperlipidemia   . Hypertension   . Hypothyroidism   . 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 Obesity, unspecified 2013  . Other sign and symptom in breast 2013   Right bst US,lower outer quadrant,A single 0.3x0.4x0.6cm hypoechoic mass with slightly lobulated borders with adjacent 0.3x0.4x0.5cm mass was noted. The 1st was 5cm from nipple, 2nd at 8 cm from the nipple. The previous lesion aspirate was at 3 0'clock position. These lesions are thought to account for the mammographic abnormality. Minimal interval change on [place].   [facility])   . Rib fracture    age 65  . Schizophrenia ([place])   . TBI (traumatic brain injury) ([place])   . Thyroid disease    hypothyroid  . Vitamin D deficiency     Past Surgical History:  Procedure Laterality Date  . COLONOSCOPY WITH PROPOFOL N/A 02/16/2016   Procedure: COLONOSCOPY WITH PROPOFOL;  Surgeon: Lollie Sails, MD;  Location[facility] ENDOSCOPY;  Service: Endoscopy;  Laterality: N/A;  . COLONOSCOPY WITH PROPOFOL N/A 02/22/2016   Procedure:  COLONOSCOPY WITH PROPOFOL;  Surgeon: Lollie Sails, MD;  Location: [facility] ENDOSCOPY;  Service: Endoscopy;  Laterality: N/A;  . EYE SURGERY Left 2013   cataract surgery  . PERIPHERAL VASCULAR CATHETERIZATION N/A 10/13/2016   Procedure: Dialysis/Perma Catheter Insertion;  Surgeon: Algernon Huxley, MD;  Location: [facility] LAB;  Service: Cardiovascular;  Laterality: N/A;  . TUMOR EXCISION Right    age of 84, tumor removed from [place]    Prior to Admission medications   Medication Sig Start Date End Date Taking? Authorizing Provider  acetaminophen (TYLENOL) 325 MG tablet Take 650 mg by mouth every 6 (six) hours as needed for mild pain. Every 4 to 6 hours PRN   Yes Historical Provider, MD  albuterol (PROVENTIL HFA;VENTOLIN HFA) 108 (90 Base) MCG/ACT inhaler Inhale 1 puff into the lungs 4 (four) times daily as needed for wheezing or shortness of breath.   Yes Historical Provider, MD  calcium carbonate (OSCAL) 1500 (600 Ca) MG TABS tablet Take 600 mg of elemental calcium by mouth 2 (two) times daily with a meal.   Yes Historical Provider, MD  Cholecalciferol 5000 units TABS Take 5,000 Units by mouth daily.   Yes Historical Provider, MD  citalopram (CELEXA) 40 MG tablet Take 40 mg by mouth daily.   Yes Historical Provider, MD  cloZAPine (CLOZARIL) 100 MG tablet Take 200 mg by mouth at bedtime.    Yes Historical Provider, MD  diphenhydrAMINE (BENADRYL) 50 MG capsule Take 50 mg  by mouth at bedtime.   Yes Historical Provider, MD  furosemide (LASIX) 40 MG tablet Take 40 mg by mouth daily.   Yes Historical Provider, MD  gemfibrozil (LOPID) 600 MG tablet Take 600 mg by mouth 2 (two) times daily.   Yes Historical Provider, MD  levothyroxine (SYNTHROID, LEVOTHROID) 25 MCG tablet Take 25 mcg by mouth every morning.   Yes Historical Provider, MD  lisinopril (PRINIVIL,ZESTRIL) 5 MG tablet Take 5 mg by mouth daily.   Yes Historical Provider, MD  LORazepam (ATIVAN) 0.5 MG tablet Take 0.5 mg by mouth every 4 (four)  hours as needed for anxiety.   Yes Historical Provider, MD  LORazepam (ATIVAN) 1 MG tablet Take 1 mg by mouth every morning.   Yes Historical Provider, MD  mesalamine (APRISO) 0.375 g 24 hr capsule Take 1,500 mg by mouth every morning.   Yes Historical Provider, MD  metFORMIN (GLUCOPHAGE) 500 MG tablet Take 500 mg by mouth daily.   Yes Historical Provider, MD  omega-3 acid ethyl esters (LOVAZA) 1 g capsule Take 2 g by mouth every morning.    Yes Historical Provider, MD  pantoprazole (PROTONIX) 40 MG tablet Take 40 mg by mouth daily.    Yes Historical Provider, MD  potassium chloride (K-DUR) 10 MEQ tablet Take 20 mEq by mouth 2 (two) times daily.   Yes Historical Provider, MD  pseudoephedrine-guaifenesin (MUCINEX D) 60-600 MG 12 hr tablet Take 2 tablets by mouth every 12 (twelve) hours as needed for congestion.    Yes Historical Provider, MD  risperiDONE (RISPERDAL M-TAB) 1 MG disintegrating tablet Take 1 mg by mouth 2 (two) times daily.   Yes Historical Provider, MD  simvastatin (ZOCOR) 20 MG tablet Take 20 mg by mouth at bedtime.   Yes Historical Provider, MD  spironolactone (ALDACTONE) 25 MG tablet Take 50 mg by mouth daily.   Yes Historical Provider, MD  vitamin B-12 (CYANOCOBALAMIN) 1000 MCG tablet Take 1,000 mcg by mouth daily.   Yes Historical Provider, MD    Allergies as of 09/27/2016 - Review Complete 09/27/2016  Allergen Reaction Noted  . Peanut oil Other (See Comments) 07/01/2014  . Risperidone and related Cough 07/01/2014    Family History  Problem Relation Age of Onset  . Cancer Mother 66    colon  . Cancer Paternal Aunt     Social History   Social History  . Marital status: Single    Spouse name: N/A  . Number of children: N/A  . Years of education: N/A   Occupational History  . Not on file.   Social History Main Topics  . Smoking status: Never Smoker  . Smokeless tobacco: Never Used  . Alcohol use No  . Drug use: No  . Sexual activity: Not on file   Other  Topics Concern  . Not on file   Social History Narrative  . No narrative on file    Review of Systems: See HPI, otherwise negative ROS  Physical Exam: BP (!) 108/50   Pulse 94   Temp 99.1 F (37.3 C) (Tympanic)   Resp 17   Ht 5\' 6"  (1.676 m)   Wt 259 lb 4.8 oz (117.6 kg)   SpO2 96%   BMI 41.85 kg/m  General:   Alert,  pleasant and cooperative in NAD Head:  Normocephalic and atraumatic. Neck:  Supple; no masses or thyromegaly. Lungs:  Clear throughout to auscultation.    Heart:  Regular rate and rhythm. Abdomen:  Soft, nontender and nondistended. Normal  bowel sounds, without guarding, and without rebound.   Neurologic:  Alert and  oriented x4;  grossly normal neurologically.  Impression/Plan: Hamilton Capri is here for an colonoscopy to be performed for diarrhea with a background of crohns colitis  Risks, benefits, limitations, and alternatives regarding  colonoscopy have been reviewed with the patient.  Questions have been answered.  All parties agreeable.   Jonathon Bellows, MD  10/14/2016, 8:37 AM

## 2016-10-14 NOTE — Clinical Social Work Note (Signed)
CSW will facilitate discharge to [facility] when time. Pasrr obtained. RN CM following to arrange new dialysis. Shela Leff MSW,LCSW [phone]

## 2016-10-14 NOTE — Progress Notes (Signed)
HD STARTED  

## 2016-10-15 ENCOUNTER — Encounter: Payer: Self-pay | Admitting: Gastroenterology

## 2016-10-15 LAB — RENAL FUNCTION PANEL
ANION GAP: 8 (ref 5–15)
Albumin: 1.8 g/dL — ABNORMAL LOW (ref 3.5–5.0)
BUN: 31 mg/dL — ABNORMAL HIGH (ref 6–20)
CHLORIDE: 110 mmol/L (ref 101–111)
CO2: 22 mmol/L (ref 22–32)
Calcium: 7.2 mg/dL — ABNORMAL LOW (ref 8.9–10.3)
Creatinine, Ser: 4.26 mg/dL — ABNORMAL HIGH (ref 0.44–1.00)
GFR, EST AFRICAN AMERICAN: 13 mL/min — AB (ref >60)
GFR, EST NON AFRICAN AMERICAN: 12 mL/min — AB (ref >60)
Glucose, Bld: 136 mg/dL — ABNORMAL HIGH (ref 65–99)
POTASSIUM: 3.5 mmol/L (ref 3.5–5.1)
Phosphorus: 3.6 mg/dL (ref 2.5–4.6)
Sodium: 140 mmol/L (ref 135–145)

## 2016-10-15 LAB — TYPE AND SCREEN
ABO/RH(D): B POS
Antibody Screen: NEGATIVE
Unit division: 0
Unit division: 0

## 2016-10-15 LAB — GLUCOSE, CAPILLARY
GLUCOSE-CAPILLARY: 121 mg/dL — AB (ref 65–99)
GLUCOSE-CAPILLARY: 137 mg/dL — AB (ref 65–99)
GLUCOSE-CAPILLARY: 137 mg/dL — AB (ref 65–99)
GLUCOSE-CAPILLARY: 128 mg/dL — AB (ref 65–99)
Glucose-Capillary: 94 mg/dL (ref 65–99)

## 2016-10-15 NOTE — Progress Notes (Signed)
Pre hd 

## 2016-10-15 NOTE — Progress Notes (Signed)
[facility] [place] NAME: [name]    MR#:  [id number]  DATE OF BIRTH:  [birth year]  SUBJECTIVE:   Pt. Originally admitted from Altered mental status due to shock and multi-organ failure.  Initially thought to be sepsis and hypovolemic shock but that has been ruled out now. Patient had severe diarrhea secondary to severe Crohn's disease and has been started on mesalamine suppositories and oral prednisone. Also has developed acute kidney injury and now requiring hemodialysis. Diarrhea improved this morning, no other complaints presently.  REVIEW OF SYSTEMS:    Review of Systems  Constitutional: Negative for chills and fever.  HENT: Negative for congestion and tinnitus.   Eyes: Negative for blurred vision and double vision.  Respiratory: Negative for cough, shortness of breath and wheezing.   Cardiovascular: Negative for chest pain, orthopnea and PND.  Gastrointestinal: Positive for diarrhea. Negative for abdominal pain, nausea and vomiting.  Genitourinary: Negative for dysuria and hematuria.  Neurological: Positive for weakness. Negative for dizziness, sensory change and focal weakness.  All other systems reviewed and are negative.   Nutrition: Clear liquid  Tolerating Diet: Yes Tolerating PT: Ambulatory   DRUG ALLERGIES:   Allergies  Allergen Reactions  . Peanut Oil Other (See Comments)    Face turns red  . Risperidone And Related Cough    VITALS:  Blood pressure (!) 104/59, pulse 86, temperature 99.2 F (37.3 C), temperature source Oral, resp. rate (!) 21, height 5\' 6"  (1.676 m), weight 116 kg (255 lb 11.7 oz), SpO2 96 %.  PHYSICAL EXAMINATION:   Physical Exam  GENERAL:  45 y.o.-year-old patient lying in the bed in no acute distress.  EYES: Pupils equal, round, reactive to light and accommodation. No scleral icterus. Extraocular muscles intact.  HEENT: Head atraumatic, normocephalic. Oropharynx and nasopharynx clear.   NECK:  Supple, no jugular venous distention. No thyroid enlargement, no tenderness.  LUNGS: Normal breath sounds bilaterally, no wheezing, rales, rhonchi. No use of accessory muscles of respiration.  CARDIOVASCULAR: S1, S2 normal. No murmurs, rubs, or gallops.  ABDOMEN: Soft, nontender, nondistended. Bowel sounds present. No organomegaly or mass.  EXTREMITIES: No cyanosis, clubbing or edema b/l.    NEUROLOGIC: Cranial nerves II through XII are intact. No focal Motor or sensory deficits b/l.   PSYCHIATRIC: The patient is alert and oriented x 3.  SKIN: No obvious rash, lesion, or ulcer.   Right chest wall HD cath. In place with no acute drainage noted.    LABORATORY PANEL:   CBC  Recent Labs Lab 10/14/16 0448 10/14/16 1809  WBC 10.1  --   HGB 7.3* 8.8*  HCT 22.0* 25.7*  PLT 518*  --    ------------------------------------------------------------------------------------------------------------------  Chemistries   Recent Labs Lab 10/14/16 0448  NA 139  K 3.2*  CL 109  CO2 20*  GLUCOSE 88  BUN 58*  CREATININE 6.59*  CALCIUM 7.5*  MG 1.9   ------------------------------------------------------------------------------------------------------------------  Cardiac Enzymes No results for input(s): TROPONINI in the last 168 hours. ------------------------------------------------------------------------------------------------------------------  RADIOLOGY:  No results found.   ASSESSMENT AND PLAN:   45 year old female with past medical history of schizophrenia, hypothyroidism, hypertension, hyperlipidemia, GERD, diabetes who presented to the hospital initially due to altered mental status.  1. Altered mental status-initially patient was admitted to the hospital due to shock and was obtunded. -Post extubation patient's mental status is much improved and back to baseline now.  2. Shock-initially thought to be septic in nature but that has been ruled  out. -Likely  hypovolemic in nature secondary to severe diarrhea and now hemodynamics much improved. -Echocardiogram showed normal ejection fraction. Cultures so far have been negative. Next  3. Acute respiratory failure with hypoxia-patient was intubated due to altered mental status of the obtundation. Patient has been exudative since December 1 and O2 sats is stable. No acute respiratory issues.  4. Acute kidney injury-secondary to severe sepsis and ATN. Patient was on CRRT when she was hemodynamically unstable. Now has been converted to hemodialysis. -Continue further care as per nephrology. Patient may need to be on intermittent hemodialysis upon discharge and nephrology will continue follow-up patient is an outpatient.  5. Crohn's disease-patient had severe diarrhea and hypovolemia. Continues to have diarrhea but improving. Cont. Cholestyramine.  -She had a colonoscopy that showed skip areas with ulceration and severe mucosal edema. -As per gastroenterology continue oral steroids, Pentasa suppositories and follow up with gastroenterology as an outpatient.  6. History of schizo affective disorder-continue Zyprexa  7. Hypothyroidism-continue Synthroid.   All the records are reviewed and case discussed with Care Management/Social Worker. Management plans discussed with the patient, family and they are in agreement.  CODE STATUS: Full code  DVT Prophylaxis: Hep. SQ  TOTAL TIME TAKING CARE OF THIS PATIENT: 30 minutes.   POSSIBLE D/C IN 2-3 DAYS, DEPENDING ON CLINICAL CONDITION.   Henreitta Leber M.D on 10/15/2016 at 2:47 PM  Between 7am to 6pm - Pager - [phone]  After 6pm go to [email or web address] - Proofreader  [facility]  Office  [phone]  CC: Primary care physician; Lorelee Market, MD

## 2016-10-15 NOTE — Progress Notes (Signed)
  End of hd 

## 2016-10-15 NOTE — Progress Notes (Signed)
Pre hd assessment  

## 2016-10-15 NOTE — Progress Notes (Signed)
Physical Therapy Treatment Patient Details Name: [name] MRN: [id number] DOB: [birth year] Today's Date: 10/15/2016    History of Present Illness Patient is a 45 y.o. female admitted on 28 NOV after being found unresponsive in group home bathroom after hitting her head on her dresser in the night. Patient diagnosed with acute septic shock from UTI, severe metabolic acidosis, and acute encephalopathy w/acute respiratory failure. PMH includes schizophrenia.    PT Comments    Pt tolerating treatment session well, received asleep, but awakened and agreeable to participate. Pt able to complete session without increases in pain, and only midl fatigue. Noted weakness on LLE compared to RLE, pt endorses as baseline. Pt follows simple instructions well, is conversational, pleasant, and amicable, but does demonstrate some difficulty counting with accuracy during exercises. Pt progressing well toward goals, now AMB to BR frequently with nursing. Able to perform multi transfers at [facility] with HHA, and AMB short room distances with HHA. Pt demonstrating improved strength and activity tolerance. Most recent labs revealing Hb: 8.8, HCT: 25.7. DC plan should be updated to include HHPT, however still recommending close supervision with mobility, and intermittent supervision at night/day.     Follow Up Recommendations  Supervision/Assistance - 24 hour;Supervision for mobility/OOB (Return to group home with close supervision; supervision for OOB mobility. )     Equipment Recommendations       Recommendations for Other Services       Precautions / Restrictions Precautions Precautions: None Restrictions Weight Bearing Restrictions: No    Mobility  Bed Mobility Overal bed mobility: Needs Assistance Bed Mobility: Supine to Sit     Supine to sit: Supervision     General bed mobility comments: moderate effort due to core weakness.   Transfers Overall transfer level: Needs assistance Equipment  used: 1 person hand held assist Transfers: Sit to/from Stand Sit to Stand: Min guard;From elevated surface         General transfer comment: 2x4 from elevated surface and HHA; fatigue after 4, rests 60sec between sets.   Ambulation/Gait Ambulation/Gait assistance: Min guard Ambulation Distance (Feet): 20 Feet (EOB to BR on contrlateral side) Assistive device: 1 person hand held assist Gait Pattern/deviations: Wide base of support;Decreased step length - right;Decreased step length - left   Gait velocity interpretation: <1.8 ft/sec, indicative of risk for recurrent falls     Stairs            Wheelchair Mobility    Modified Rankin (Stroke Patients Only)       Balance Overall balance assessment: No apparent balance deficits (not formally assessed);Needs assistance                                  Cognition Arousal/Alertness: Awake/alert Behavior During Therapy: WFL for tasks assessed/performed Overall Cognitive Status: No family/caregiver present to determine baseline cognitive functioning                      Exercises Total Joint Exercises Bridges: Strengthening;Both;10 reps;Supine General Exercises - Lower Extremity Short Arc Quad: Strengthening;Both;10 reps;Supine Heel Slides: Strengthening;Both;10 reps;Supine Hip ABduction/ADduction: Strengthening;Both;10 reps;Supine Straight Leg Raises: Strengthening;Both;10 reps;Supine    General Comments        Pertinent Vitals/Pain Pain Assessment: 0-10 Pain Score: 7  Pain Location: Neck near incision Pain Intervention(s): Limited activity within patient's tolerance;Monitored during session;Repositioned    Home Living  Prior Function            PT Goals (current goals can now be found in the care plan section) Acute Rehab PT Goals Patient Stated Goal: "To get better" PT Goal Formulation: With patient Time For Goal Achievement: 10/22/16 Potential to  Achieve Goals: Fair Progress towards PT goals: Progressing toward goals    Frequency    Min 2X/week      PT Plan Discharge plan needs to be updated    Co-evaluation             End of Session   Activity Tolerance: Patient tolerated treatment well;Patient limited by fatigue;No increased pain Patient left: with nursing/sitter in room;Other (comment) (in BR on BSC over toilet)     Time: [id number] PT Time Calculation (min) (ACUTE ONLY): 17 min  Charges:  $Therapeutic Exercise: 8-22 mins                    G Codes:      11:49 AM, 10/25/2016 Etta Grandchild, PT, DPT Physical Therapist - [place] [phone] (mobile)

## 2016-10-15 NOTE — Progress Notes (Signed)
Hd start 

## 2016-10-15 NOTE — Progress Notes (Signed)
Post hd vitals 

## 2016-10-15 NOTE — Progress Notes (Signed)
Post hd assessment 

## 2016-10-15 NOTE — Progress Notes (Signed)
[facility]  ROUNDING NOTE   Subjective:   Patient seen during dialysis Tolerating well    HEMODIALYSIS FLOWSHEET:  Blood Flow Rate (mL/min): 250 mL/min Arterial Pressure (mmHg): -70 mmHg Venous Pressure (mmHg): 70 mmHg Transmembrane Pressure (mmHg): 50 mmHg Ultrafiltration Rate (mL/min): 200 mL/min Dialysate Flow Rate (mL/min): 500 ml/min Conductivity: Machine : 14 Conductivity: Machine : 14 Dialysis Fluid Bolus: Normal Saline Bolus Amount (mL): 250 mL (prime) Dialysate Change: 4K Intra-Hemodialysis Comments: hd start, pt alert, no c/o, vss.       Objective:  Vital signs in last 24 hours:  Temp:  [98.6 F (37 C)-99.2 F (37.3 C)] 99.2 F (37.3 C) (12/16 1419) Pulse Rate:  [86-108] 86 (12/16 1420) Resp:  [16-24] 21 (12/16 1420) BP: (91-117)/(43-90) 104/59 (12/16 1420) SpO2:  [95 %-100 %] 96 % (12/16 1420) Weight:  [116 kg (255 lb 11.7 oz)-116.5 kg (256 lb 13.4 oz)] 116 kg (255 lb 11.7 oz) (12/16 1419)  Weight change: -0.518 kg (-1 lb 2.3 oz) Filed Weights   10/14/16 1700 10/15/16 0500 10/15/16 1419  Weight: 116.5 kg (256 lb 13.4 oz) 116.2 kg (256 lb 3.2 oz) 116 kg (255 lb 11.7 oz)    Intake/Output: I/O last 3 completed shifts: In: 1691 [P.O.:1110; I.V.:225; Blood:356] Out: 140 [Other:140]   Intake/Output this shift:  Total I/O In: 750 [P.O.:750] Out: -   Physical Exam: General: No acute distress  Head: OM moist  Eyes: Anicteric  Neck: Supple, trachea midline  Lungs:  clear, normal effort   Heart: S1S2 No rubs   Abdomen:  Soft, non tender  Extremities: Trace peripheral edema.  Neurologic: Alert, able to answer questions  Skin: No lesions  Access: Right IJ PermCath    Basic Metabolic Panel:  Recent Labs Lab 10/09/16 0513 10/10/16 0625 10/12/16 0440 10/13/16 0514 10/14/16 0448  NA 136 137 135 133* 139  K 3.2* 3.2* 3.6 3.2* 3.2*  CL 102 103 104 102 109  CO2 28 26 22  20* 20*  GLUCOSE 99 93 92 85 88  BUN 32* 42* 61* 63* 58*   CREATININE 4.71* 5.60* 6.68* 6.64* 6.59*  CALCIUM 7.5* 7.6* 7.8* 7.3* 7.5*  MG  --   --   --   --  1.9  PHOS 4.5 4.4 5.3*  --   --     Liver Function Tests:  Recent Labs Lab 10/09/16 0513 10/10/16 0625 10/12/16 0440  ALBUMIN 1.9* 1.9* 2.0*   No results for input(s): LIPASE, AMYLASE in the last 168 hours. No results for input(s): AMMONIA in the last 168 hours.  CBC:  Recent Labs Lab 10/09/16 0513 10/10/16 0625 10/12/16 0440 10/13/16 0514 10/14/16 0127 10/14/16 0448 10/14/16 1809  WBC 13.9* 13.1* 10.4  --   --  10.1  --   NEUTROABS  --   --  4.9  --   --  5.4  --   HGB 7.1* 6.8* 7.0* 6.0* 7.5* 7.3* 8.8*  HCT 21.3* 20.8* 20.3*  --  21.9* 22.0* 25.7*  MCV 93.4 93.5 93.0  --   --  90.0  --   PLT 313 349 433  --   --  518*  --     Cardiac Enzymes: No results for input(s): CKTOTAL, CKMB, CKMBINDEX, TROPONINI in the last 168 hours.  BNP: Invalid input(s): POCBNP  CBG:  Recent Labs Lab 10/14/16 1741 10/14/16 2037 10/15/16 0810 10/15/16 1159 10/15/16 1434  GLUCAP 87 131* 94 121* 137*    Microbiology: Results for orders placed or performed  during the hospital encounter of 09/27/16  Blood Culture (routine x 2)     Status: None   Collection Time: 09/27/16  6:02 AM  Result Value Ref Range Status   Specimen Description BLOOD  R AC  Final   Special Requests   Final    BOTTLES DRAWN AEROBIC AND ANAEROBIC  AER 3 ML ANA 4 ML   Culture NO GROWTH 5 DAYS  Final   Report Status 10/02/2016 FINAL  Final  Blood Culture (routine x 2)     Status: None   Collection Time: 09/27/16  6:03 AM  Result Value Ref Range Status   Specimen Description BLOOD  R ARM  Final   Special Requests   Final    BOTTLES DRAWN AEROBIC AND ANAEROBIC  AER 6 ML ANA 10 ML   Culture NO GROWTH 5 DAYS  Final   Report Status 10/02/2016 FINAL  Final  Urine culture     Status: None   Collection Time: 09/27/16  9:30 AM  Result Value Ref Range Status   Specimen Description URINE, RANDOM  Final    Special Requests NONE  Final   Culture NO GROWTH Performed at [facility]   Final   Report Status 09/28/2016 FINAL  Final  MRSA PCR Screening     Status: None   Collection Time: 09/27/16 11:09 AM  Result Value Ref Range Status   MRSA by PCR NEGATIVE NEGATIVE Final    Comment:        The GeneXpert MRSA Assay (FDA approved for NASAL specimens only), is one component of a comprehensive MRSA colonization surveillance program. It is not intended to diagnose MRSA infection nor to guide or monitor treatment for MRSA infections.   C difficile quick scan w PCR reflex     Status: None   Collection Time: 10/04/16 12:20 AM  Result Value Ref Range Status   C Diff antigen NEGATIVE NEGATIVE Final   C Diff toxin NEGATIVE NEGATIVE Final   C Diff interpretation No C. difficile detected.  Final  Gastrointestinal Panel by PCR , Stool     Status: None   Collection Time: 10/08/16  7:00 PM  Result Value Ref Range Status   Campylobacter species NOT DETECTED NOT DETECTED Final   Plesimonas shigelloides NOT DETECTED NOT DETECTED Final   Salmonella species NOT DETECTED NOT DETECTED Final   Yersinia enterocolitica NOT DETECTED NOT DETECTED Final   Vibrio species NOT DETECTED NOT DETECTED Final   Vibrio cholerae NOT DETECTED NOT DETECTED Final   Enteroaggregative E coli (EAEC) NOT DETECTED NOT DETECTED Final   Enteropathogenic E coli (EPEC) NOT DETECTED NOT DETECTED Final   Enterotoxigenic E coli (ETEC) NOT DETECTED NOT DETECTED Final   Shiga like toxin producing E coli (STEC) NOT DETECTED NOT DETECTED Final   Shigella/Enteroinvasive E coli (EIEC) NOT DETECTED NOT DETECTED Final   Cryptosporidium NOT DETECTED NOT DETECTED Final   Cyclospora cayetanensis NOT DETECTED NOT DETECTED Final   Entamoeba histolytica NOT DETECTED NOT DETECTED Final   Giardia lamblia NOT DETECTED NOT DETECTED Final   Adenovirus F40/41 NOT DETECTED NOT DETECTED Final   Astrovirus NOT DETECTED NOT DETECTED Final    Norovirus GI/GII NOT DETECTED NOT DETECTED Final   Rotavirus A NOT DETECTED NOT DETECTED Final   Sapovirus (I, II, IV, and V) NOT DETECTED NOT DETECTED Final    Coagulation Studies: No results for input(s): LABPROT, INR in the last 72 hours.  Urinalysis: No results for input(s): COLORURINE, LABSPEC, [place], [place], [place],  BILIRUBINUR, KETONESUR, PROTEINUR, UROBILINOGEN, NITRITE, LEUKOCYTESUR in the last 72 hours.  Invalid input(s): APPERANCEUR    Imaging: No results found.   Medications:    . sodium chloride   Intravenous Once  . sodium chloride   Intravenous Once  . bacitracin   Topical BID  . chlorhexidine  15 mL Mouth Rinse BID  . cholestyramine light  4 g Oral BID  . citalopram  40 mg Oral Daily  . cloZAPine  200 mg Oral Daily  . epoetin (EPOGEN/PROCRIT) injection  10,000 Units Intravenous Once  . famotidine  20 mg Oral Daily  . haloperidol lactate  5 mg Intravenous Once  . heparin  5,000 Units Subcutaneous Q8H  . levothyroxine  50 mcg Oral QAC breakfast  . mesalamine  500 mg Oral TID  . mesalamine  4 g Rectal QHS  . OLANZapine zydis  10 mg Oral BID WC  . predniSONE  40 mg Oral Q breakfast  . simvastatin  20 mg Oral q1800  . sodium chloride flush  10-40 mL Intracatheter Q12H  . tuberculin  5 Units Intradermal Once   sodium chloride, acetaminophen, LORazepam, ondansetron (ZOFRAN) IV  Assessment/ Plan:  45 y.o.white female with a PMHx of Crohn's disease, depression, diabetes mellitus type 2, GERD, hiatal hernia, hyperlipidemia, hypertension, hypothyroidism, obesity, schizophrenia, traumatic brain injury, vitamin D deficiency, who was admitted to [facility] on 09/27/2016   1.  Acute renal failure: CRRT from 11/29 to 12/5. Hemodialysis treatment on 12/8 Acute renal failure sepsis, hypotension, and hypoxemia Presenting creatinine 4.09 on September 19, 2016.  Prior to that, the only known creatinine is 1.16 from January 2014 Serum creatinine remains critically high  Last   level is 6.6. On 12/15 Tunnelled dialysis catheter has been placed Possible outpatient dialysis for acute renal failure  Place PPD Continue HD for AKI  2.  Hypokalemia - Likely GI losses - replace as necessary   3. Inflammatory bowel disease- Crohn's colitis - followed by Dr [name] and [facility]    LOS: 54 Elliet Goodnow 12/16/20172:57 PM

## 2016-10-16 LAB — GLUCOSE, CAPILLARY
GLUCOSE-CAPILLARY: 90 mg/dL (ref 65–99)
GLUCOSE-CAPILLARY: 111 mg/dL — AB (ref 65–99)
Glucose-Capillary: 133 mg/dL — ABNORMAL HIGH (ref 65–99)
Glucose-Capillary: 124 mg/dL — ABNORMAL HIGH (ref 65–99)

## 2016-10-16 NOTE — Progress Notes (Signed)
[place] [facility] [place] NAME: [name]    MR#:  [id number]  DATE OF BIRTH:  June 05, 1971  SUBJECTIVE:   Diarrhea improved.  No N/V. Wants to try and eat some regular food.  No abdominal pain.   REVIEW OF SYSTEMS:    Review of Systems  Constitutional: Negative for chills and fever.  HENT: Negative for congestion and tinnitus.   Eyes: Negative for blurred vision and double vision.  Respiratory: Negative for cough, shortness of breath and wheezing.   Cardiovascular: Negative for chest pain, orthopnea and PND.  Gastrointestinal: Positive for diarrhea. Negative for abdominal pain, nausea and vomiting.  Genitourinary: Negative for dysuria and hematuria.  Neurological: Positive for weakness. Negative for dizziness, sensory change and focal weakness.  All other systems reviewed and are negative.   Nutrition: Clear liquid  Tolerating Diet: Yes Tolerating PT: Ambulatory   DRUG ALLERGIES:   Allergies  Allergen Reactions  . Peanut Oil Other (See Comments)    Face turns red  . Risperidone And Related Cough    VITALS:  Blood pressure 108/61, pulse 91, temperature 98.6 F (37 C), temperature source Oral, resp. rate 18, height 5\' 6"  (1.676 m), weight 115.8 kg (255 lb 4.7 oz), SpO2 98 %.  PHYSICAL EXAMINATION:   Physical Exam  GENERAL:  45 y.o.-year-old obese patient lying in the bed in no acute distress.  EYES: Pupils equal, round, reactive to light and accommodation. No scleral icterus. Extraocular muscles intact.  HEENT: Head atraumatic, normocephalic. Oropharynx and nasopharynx clear.  NECK:  Supple, no jugular venous distention. No thyroid enlargement, no tenderness.  LUNGS: Normal breath sounds bilaterally, no wheezing, rales, rhonchi. No use of accessory muscles of respiration.  CARDIOVASCULAR: S1, S2 normal. No murmurs, rubs, or gallops.  ABDOMEN: Soft, nontender, nondistended. Bowel sounds present. No organomegaly or mass.   EXTREMITIES: No cyanosis, clubbing or edema b/l.    NEUROLOGIC: Cranial nerves II through XII are intact. No focal Motor or sensory deficits b/l.   PSYCHIATRIC: The patient is alert and oriented x 3.  SKIN: No obvious rash, lesion, or ulcer.   Right chest wall HD cath. In place with no acute drainage noted.    LABORATORY PANEL:   CBC  Recent Labs Lab 10/14/16 0448 10/14/16 1809  WBC 10.1  --   HGB 7.3* 8.8*  HCT 22.0* 25.7*  PLT 518*  --    ------------------------------------------------------------------------------------------------------------------  Chemistries   Recent Labs Lab 10/14/16 0448 10/15/16 1420  NA 139 140  K 3.2* 3.5  CL 109 110  CO2 20* 22  GLUCOSE 88 136*  BUN 58* 31*  CREATININE 6.59* 4.26*  CALCIUM 7.5* 7.2*  MG 1.9  --    ------------------------------------------------------------------------------------------------------------------  Cardiac Enzymes No results for input(s): TROPONINI in the last 168 hours. ------------------------------------------------------------------------------------------------------------------  RADIOLOGY:  No results found.   ASSESSMENT AND PLAN:   45 year old female with past medical history of schizophrenia, hypothyroidism, hypertension, hyperlipidemia, GERD, diabetes who presented to the hospital initially due to altered mental status.  1. Altered mental status-initially patient was admitted to the hospital due to shock and was obtunded. -Post extubation patient's mental status is much improved and back to baseline now.  2. Shock-initially thought to be septic in nature but that has been ruled out. -hypovolemic in nature secondary to severe diarrhea and now hemodynamics much improved. -Echocardiogram showed normal ejection fraction. Cultures so far have been negative.   3. Acute respiratory failure with hypoxia-patient was intubated due to  altered mental status and obtundation. Patient has been  exudative since December 1 and O2 sats are stable. No acute respiratory issues.  4. Acute kidney injury-secondary to severe sepsis and ATN. Patient was on CRRT when she was hemodynamically unstable. Now has been converted to hemodialysis. -Continue further care as per nephrology. Patient may need to be on intermittent hemodialysis upon discharge and nephrology will continue follow-up patient is an outpatient. - nephrology helping in setting up outpatient HD.   5. Crohn's disease-patient had severe diarrhea and hypovolemia. Continues to have diarrhea but improving. Cont. Cholestyramine.  -She had a colonoscopy that showed skip areas with ulceration and severe mucosal edema. -As per gastroenterology continue oral steroids, Pentasa suppositories and follow up with gastroenterology as an outpatient. - tolerating clear liquids well and will advance to soft diet today and monitor.   6. History of schizo affective disorder-continue Zyprexa  7. Hypothyroidism-continue Synthroid.   All the records are reviewed and case discussed with Care Management/Social Worker. Management plans discussed with the patient, family and they are in agreement.  CODE STATUS: Full code  DVT Prophylaxis: Hep. SQ  TOTAL TIME TAKING CARE OF THIS PATIENT: 30 minutes.   POSSIBLE D/C IN 1-2 DAYS, DEPENDING ON CLINICAL CONDITION.   Henreitta Leber M.D on 10/16/2016 at 12:55 PM  Between 7am to 6pm - Pager - [phone]  After 6pm go to [email or web address] - Proofreader  [facility] [place] [facility]  Office  [phone]  CC: Primary care physician; Lorelee Market, MD

## 2016-10-16 NOTE — Consult Note (Signed)
[place] [facility] Consult   Reason for Consult:  Follow-up for this 45 year old woman with schizophrenia who is recovering from sepsis. No longer in the ICU. Making significant progress. Referring Physician:  Kasa Patient Identification: [name]:  [id number] Principal Diagnosis: Undifferentiated schizophrenia [facility]) Diagnosis:   Patient Active Problem List   Diagnosis Date Noted  . Scalp laceration [S01.01XA]   . Pressure injury of skin [L89.90] 10/02/2016  . 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 Acute renal failure (ARF) ([place]) [N17.9]   . Respiratory failure ([place]) [J96.90] 09/27/2016  . Other sign and symptom in breast [N64.59]     Total Time spent with patient: 20 minutes  Subjective:   [name] is a 45 y.o. female patient admitted with "I guess I'm okay".  XBL:[id number] up Saturday the 17th. No new complaints. Arousable. Calm. Risk to Self: Is patient at risk for suicide?: No Risk to Others:   Prior Inpatient Therapy:   Prior Outpatient Therapy:    Past Medical History:  Past Medical History:  Diagnosis Date  . Abnormal mammogram, unspecified 2013   Prev. cytology,hypercellular smears without evidence of malignant cells. The cytopathologist questioned if samples truly representative. Care taken during sampling and is felt to be representative.  . Breast screening, unspecified 2013  . Broken leg    age 47  . Cellulitis   . Crohn's disease ([place]) 2013  . Depression   . Diabetes mellitus without complication (HCC)    non insulin dependent  . Early menopause   . Edema   . GERD (gastroesophageal reflux disease)   . Hiatal hernia 2013  . Hyperlipidemia   . Hypertension   . Hypothyroidism   . 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 Obesity, unspecified 2013  . Other sign and symptom in breast 2013   Right bst US,lower outer quadrant,A single 0.3x0.4x0.6cm hypoechoic mass with slightly lobulated borders  with adjacent 0.3x0.4x0.5cm mass was noted. The 1st was 5cm from nipple, 2nd at 8 cm from the nipple. The previous lesion aspirate was at 3 0'clock position. These lesions are thought to account for the mammographic abnormality. Minimal interval change on [place].   [facility])   . Rib fracture    age 82  . Schizophrenia ([place])   . TBI (traumatic brain injury) ([place])   . Thyroid disease    hypothyroid  . Vitamin D deficiency     Past Surgical History:  Procedure Laterality Date  . COLONOSCOPY WITH PROPOFOL N/A 02/16/2016   Procedure: COLONOSCOPY WITH PROPOFOL;  Surgeon: Lollie Sails, MD;  Location: [facility] ENDOSCOPY;  Service: Endoscopy;  Laterality: N/A;  . COLONOSCOPY WITH PROPOFOL N/A 02/22/2016   Procedure: COLONOSCOPY WITH PROPOFOL;  Surgeon: Lollie Sails, MD;  Location: [facility] ENDOSCOPY;  Service: Endoscopy;  Laterality: N/A;  . COLONOSCOPY WITH PROPOFOL N/A 10/14/2016   Procedure: COLONOSCOPY WITH PROPOFOL;  Surgeon: Jonathon Bellows, MD;  Location: [facility] ENDOSCOPY;  Service: Endoscopy;  Laterality: N/A;  . EYE SURGERY Left 2013   cataract surgery  . PERIPHERAL VASCULAR CATHETERIZATION N/A 10/13/2016   Procedure: Dialysis/Perma Catheter Insertion;  Surgeon: Algernon Huxley, MD;  Location: [place] [facility] LAB;  Service: Cardiovascular;  Laterality: N/A;  . TUMOR EXCISION Right    age of 37, tumor removed from RUE   Family History:  Family History  Problem Relation Age of Onset  . Cancer Mother 40    colon  . Cancer Paternal Aunt  Family Psychiatric  History: None identified Social History:  History  Alcohol Use No     History  Drug Use No    Social History   Social History  . Marital status: Single    Spouse name: N/A  . Number of children: N/A  . Years of education: N/A   Social History Main Topics  . Smoking status: Never Smoker  . Smokeless tobacco: Never Used  . Alcohol use No  . Drug use: No  . Sexual activity: Not Asked   Other Topics Concern  .  None   Social History Narrative  . None   Additional Social History:    Allergies:   Allergies  Allergen Reactions  . Peanut Oil Other (See Comments)    Face turns red  . Risperidone And Related Cough    Labs:  Results for orders placed or performed during the hospital encounter of 09/27/16 (from the past 48 hour(s))  Glucose, capillary     Status: None   Collection Time: 10/14/16  5:41 PM  Result Value Ref Range   Glucose-Capillary 87 65 - 99 mg/dL  Hemoglobin and hematocrit, blood     Status: Abnormal   Collection Time: 10/14/16  6:09 PM  Result Value Ref Range   Hemoglobin 8.8 (L) 12.0 - 16.0 g/dL   HCT 25.7 (L) 35.0 - 47.0 %  Glucose, capillary     Status: Abnormal   Collection Time: 10/14/16  8:37 PM  Result Value Ref Range   Glucose-Capillary 131 (H) 65 - 99 mg/dL  Glucose, capillary     Status: None   Collection Time: 10/15/16  8:10 AM  Result Value Ref Range   Glucose-Capillary 94 65 - 99 mg/dL   Comment 1 Notify RN   Glucose, capillary     Status: Abnormal   Collection Time: 10/15/16 11:59 AM  Result Value Ref Range   Glucose-Capillary 121 (H) 65 - 99 mg/dL   Comment 1 Notify RN   Renal function panel     Status: Abnormal   Collection Time: 10/15/16  2:20 PM  Result Value Ref Range   Sodium 140 135 - 145 mmol/L   Potassium 3.5 3.5 - 5.1 mmol/L   Chloride 110 101 - 111 mmol/L   CO2 22 22 - 32 mmol/L   Glucose, Bld 136 (H) 65 - 99 mg/dL   BUN 31 (H) 6 - 20 mg/dL   Creatinine, Ser 4.26 (H) 0.44 - 1.00 mg/dL   Calcium 7.2 (L) 8.9 - 10.3 mg/dL   Phosphorus 3.6 2.5 - 4.6 mg/dL   Albumin 1.8 (L) 3.5 - 5.0 g/dL   GFR calc non Af Amer 12 (L) >60 mL/min   GFR calc Af Amer 13 (L) >60 mL/min    Comment: (NOTE) The eGFR has been calculated using the CKD EPI equation. This calculation has not been validated in all clinical situations. eGFR's persistently <60 mL/min signify possible Chronic Kidney Disease.    Anion gap 8 5 - 15  Glucose, capillary     Status:  Abnormal   Collection Time: 10/15/16  2:34 PM  Result Value Ref Range   Glucose-Capillary 137 (H) 65 - 99 mg/dL  Glucose, capillary     Status: Abnormal   Collection Time: 10/15/16  5:35 PM  Result Value Ref Range   Glucose-Capillary 137 (H) 65 - 99 mg/dL  Glucose, capillary     Status: Abnormal   Collection Time: 10/15/16  9:25 PM  Result Value Ref Range  Glucose-Capillary 128 (H) 65 - 99 mg/dL  Glucose, capillary     Status: None   Collection Time: 10/16/16  7:34 AM  Result Value Ref Range   Glucose-Capillary 90 65 - 99 mg/dL  Glucose, capillary     Status: Abnormal   Collection Time: 10/16/16 11:38 AM  Result Value Ref Range   Glucose-Capillary 111 (H) 65 - 99 mg/dL    Current Facility-Administered Medications  Medication Dose Route Frequency Provider Last Rate Last Dose  . 0.9 %  sodium chloride infusion  250 mL Intravenous PRN Flora Lipps, MD   Stopped at 10/14/16 1839  . 0.9 %  sodium chloride infusion   Intravenous Once Theodoro Grist, MD      . 0.9 %  sodium chloride infusion   Intravenous Once Theodoro Grist, MD      . acetaminophen (TYLENOL) tablet 650 mg  650 mg Oral Q4H PRN Flora Lipps, MD   650 mg at 10/08/16 1044  . bacitracin ointment   Topical BID Jules Husbands, MD      . chlorhexidine (PERIDEX) 0.12 % solution 15 mL  15 mL Mouth Rinse BID Flora Lipps, MD   15 mL at 10/16/16 0912  . cholestyramine light (PREVALITE) packet 4 g  4 g Oral BID Theodoro Grist, MD   4 g at 10/16/16 0911  . citalopram (CELEXA) tablet 40 mg  40 mg Oral Daily Gonzella Lex, MD   40 mg at 10/16/16 0912  . cloZAPine (CLOZARIL) tablet 200 mg  200 mg Oral Daily Gonzella Lex, MD   200 mg at 10/16/16 0919  . famotidine (PEPCID) tablet 20 mg  20 mg Oral Daily Lenis Noon, RPH   20 mg at 10/16/16 7628  . haloperidol lactate (HALDOL) injection 5 mg  5 mg Intravenous Once Harrie Foreman, MD      . heparin injection 5,000 Units  5,000 Units Subcutaneous Q8H Flora Lipps, MD   5,000 Units at  10/16/16 [phone]  . levothyroxine (SYNTHROID, LEVOTHROID) tablet 50 mcg  50 mcg Oral QAC breakfast Theodoro Grist, MD   50 mcg at 10/16/16 7616  . LORazepam (ATIVAN) tablet 2 mg  2 mg Oral Q6H PRN Laverle Hobby, MD   2 mg at 10/09/16 2317  . mesalamine (PENTASA) CR capsule 500 mg  500 mg Oral TID Theodoro Grist, MD   500 mg at 10/16/16 0923  . mesalamine (ROWASA) enema 4 g  4 g Rectal QHS Theodoro Grist, MD   4 g at 10/15/16 2148  . OLANZapine zydis (ZYPREXA) disintegrating tablet 10 mg  10 mg Oral BID WC Gonzella Lex, MD   10 mg at 10/16/16 1159  . ondansetron (ZOFRAN) injection 4 mg  4 mg Intravenous Q6H PRN Flora Lipps, MD      . predniSONE (DELTASONE) tablet 40 mg  40 mg Oral Q breakfast Theodoro Grist, MD   40 mg at 10/16/16 0912  . simvastatin (ZOCOR) tablet 20 mg  20 mg Oral q1800 Theodoro Grist, MD   20 mg at 10/15/16 1733  . sodium chloride flush (NS) 0.9 % injection 10-40 mL  10-40 mL Intracatheter Q12H Flora Lipps, MD   10 mL at 10/16/16 0920  . tuberculin injection 5 Units  5 Units Intradermal Once Murlean Iba, MD        Musculoskeletal: Strength & Muscle Tone: decreased Gait & Station: unable to stand Patient leans: N/A  Psychiatric Specialty Exam: Physical Exam  Nursing note and vitals reviewed. Constitutional:  She appears well-developed and well-nourished. No distress.  HENT:  Head: Normocephalic and atraumatic.  Eyes: Conjunctivae are normal. Pupils are equal, round, and reactive to light.  Neck: Normal range of motion.  Cardiovascular: Regular rhythm and normal heart sounds.   Respiratory: Effort normal. No respiratory distress.  GI: Soft.  Musculoskeletal: Normal range of motion.  Neurological: Coordination normal.  Skin: Skin is warm and dry.  Psychiatric: Her speech is normal. Her affect is not labile and not inappropriate. She is not agitated and not actively hallucinating. Thought content is not delusional. She does not express impulsivity. She expresses no  homicidal and no suicidal ideation. She exhibits abnormal recent memory and abnormal remote memory. She is attentive.    Review of Systems  Unable to perform ROS: Medical condition  Constitutional: Negative.   HENT: Negative.   Eyes: Negative.   Respiratory: Negative.   Cardiovascular: Negative.   Gastrointestinal: Negative.   Musculoskeletal: Negative.   Skin: Negative.   Neurological: Negative.   Psychiatric/Behavioral: Positive for hallucinations and memory loss. Negative for depression, substance abuse and suicidal ideas. The patient is nervous/anxious. The patient does not have insomnia.     Blood pressure 108/61, pulse 91, temperature 98.6 F (37 C), temperature source Oral, resp. rate 18, height _0  (1.676 m), weight 115.8 kg (255 lb 4.7 oz), SpO2 98 %.Body mass index is 41.21 kg/m.  General Appearance: Disheveled  Eye Contact:  Minimal  Speech:  Garbled  Volume:  Decreased  Mood:  Dysphoric and Irritable  Affect:  Inappropriate  Thought Process:  Disorganized  Orientation:  Negative  Thought Content:  Negative  Suicidal Thoughts:  No  Homicidal Thoughts:  No  Memory:  Negative  Judgement:  Negative  Insight:  Negative  Psychomotor Activity:  Restlessness  Concentration:  Concentration: Poor  Recall:  Poor  Fund of Knowledge:  Fair  Language:  Fair  Akathisia:  No  Handed:  Right  AIMS (if indicated):     Assets:  Financial Resources/Insurance Housing  ADL's:  Impaired  Cognition:  Impaired,  Mild and Moderate  Sleep:        Treatment Plan Summary: Going to recheck Clozapine level today. ANC looks fine. No change to medication for now.  Disposition: Patient does not meet criteria for psychiatric inpatient admission.  Alethia Berthold, MD 10/16/2016 2:32 PM

## 2016-10-16 NOTE — Plan of Care (Signed)
Problem: Education: Goal: Knowledge of [facility] Education information/materials will improve Outcome: Not Progressing Needs assistance. Patient has POA.  Problem: Health Behavior/Discharge Planning: Goal: Ability to manage health-related needs will improve Outcome: Not Progressing Patient needs assistance.  Problem: Bowel/Gastric: Goal: Will not experience complications related to bowel motility Outcome: Progressing Patient has bouts of diarrhea.

## 2016-10-16 NOTE — Progress Notes (Signed)
[facility]  ROUNDING NOTE   Subjective:   Doing fair Has been tolerating dialysis well Since complains of loose stools However, eating well No leg edema or shortness of breath     Objective:  Vital signs in last 24 hours:  Temp:  [97.8 F (36.6 C)-99.2 F (37.3 C)] 98.6 F (37 C) (12/17 1217) Pulse Rate:  [82-102] 91 (12/17 1217) Resp:  [15-23] 18 (12/17 1217) BP: (83-113)/(43-67) 108/61 (12/17 1217) SpO2:  [96 %-98 %] 98 % (12/17 1217) Weight:  [115.8 kg (255 lb 4.7 oz)-116 kg (255 lb 11.7 oz)] 115.8 kg (255 lb 4.7 oz) (12/17 0500)  Weight change: -1.1 kg (-2 lb 6.8 oz) Filed Weights   10/15/16 0500 10/15/16 1419 10/16/16 0500  Weight: 116.2 kg (256 lb 3.2 oz) 116 kg (255 lb 11.7 oz) 115.8 kg (255 lb 4.7 oz)    Intake/Output: I/O last 3 completed shifts: In: 1500 [P.O.:1500] Out: 3000 [Urine:3000]   Intake/Output this shift:  Total I/O In: 240 [P.O.:240] Out: 500 [Urine:500]  Physical Exam: General: No acute distress  Head: OM moist  Eyes: Anicteric  Neck: Supple, trachea midline  Lungs:  clear, normal effort , room air  Heart: S1S2 No rubs   Abdomen:  Soft, non tender  Extremities: Trace peripheral edema.  Neurologic: Alert, able to answer questions  Skin: No lesions  Access: Right IJ PermCath    Basic Metabolic Panel:  Recent Labs Lab 10/10/16 0625 10/12/16 0440 10/13/16 0514 10/14/16 0448 10/15/16 1420  NA 137 135 133* 139 140  K 3.2* 3.6 3.2* 3.2* 3.5  CL 103 104 102 109 110  CO2 26 22 20* 20* 22  GLUCOSE 93 92 85 88 136*  BUN 42* 61* 63* 58* 31*  CREATININE 5.60* 6.68* 6.64* 6.59* 4.26*  CALCIUM 7.6* 7.8* 7.3* 7.5* 7.2*  MG  --   --   --  1.9  --   PHOS 4.4 5.3*  --   --  3.6    Liver Function Tests:  Recent Labs Lab 10/10/16 0625 10/12/16 0440 10/15/16 1420  ALBUMIN 1.9* 2.0* 1.8*   No results for input(s): LIPASE, AMYLASE in the last 168 hours. No results for input(s): AMMONIA in the last 168  hours.  CBC:  Recent Labs Lab 10/10/16 0625 10/12/16 0440 10/13/16 0514 10/14/16 0127 10/14/16 0448 10/14/16 1809  WBC 13.1* 10.4  --   --  10.1  --   NEUTROABS  --  4.9  --   --  5.4  --   HGB 6.8* 7.0* 6.0* 7.5* 7.3* 8.8*  HCT 20.8* 20.3*  --  21.9* 22.0* 25.7*  MCV 93.5 93.0  --   --  90.0  --   PLT 349 433  --   --  518*  --     Cardiac Enzymes: No results for input(s): CKTOTAL, CKMB, CKMBINDEX, TROPONINI in the last 168 hours.  BNP: Invalid input(s): POCBNP  CBG:  Recent Labs Lab 10/15/16 1434 10/15/16 1735 10/15/16 2125 10/16/16 0734 10/16/16 1138  GLUCAP 137* 137* 128* 90 111*    Microbiology: Results for orders placed or performed during the hospital encounter of 09/27/16  Blood Culture (routine x 2)     Status: None   Collection Time: 09/27/16  6:02 AM  Result Value Ref Range Status   Specimen Description BLOOD  R AC  Final   Special Requests   Final    BOTTLES DRAWN AEROBIC AND ANAEROBIC  AER 3 ML ANA 4 ML  Culture NO GROWTH 5 DAYS  Final   Report Status 10/02/2016 FINAL  Final  Blood Culture (routine x 2)     Status: None   Collection Time: 09/27/16  6:03 AM  Result Value Ref Range Status   Specimen Description BLOOD  R ARM  Final   Special Requests   Final    BOTTLES DRAWN AEROBIC AND ANAEROBIC  AER 6 ML ANA 10 ML   Culture NO GROWTH 5 DAYS  Final   Report Status 10/02/2016 FINAL  Final  Urine culture     Status: None   Collection Time: 09/27/16  9:30 AM  Result Value Ref Range Status   Specimen Description URINE, RANDOM  Final   Special Requests NONE  Final   Culture NO GROWTH Performed at [facility]   Final   Report Status 09/28/2016 FINAL  Final  MRSA PCR Screening     Status: None   Collection Time: 09/27/16 11:09 AM  Result Value Ref Range Status   MRSA by PCR NEGATIVE NEGATIVE Final    Comment:        The GeneXpert MRSA Assay (FDA approved for NASAL specimens only), is one component of a comprehensive MRSA  colonization surveillance program. It is not intended to diagnose MRSA infection nor to guide or monitor treatment for MRSA infections.   C difficile quick scan w PCR reflex     Status: None   Collection Time: 10/04/16 12:20 AM  Result Value Ref Range Status   C Diff antigen NEGATIVE NEGATIVE Final   C Diff toxin NEGATIVE NEGATIVE Final   C Diff interpretation No C. difficile detected.  Final  Gastrointestinal Panel by PCR , Stool     Status: None   Collection Time: 10/08/16  7:00 PM  Result Value Ref Range Status   Campylobacter species NOT DETECTED NOT DETECTED Final   Plesimonas shigelloides NOT DETECTED NOT DETECTED Final   Salmonella species NOT DETECTED NOT DETECTED Final   Yersinia enterocolitica NOT DETECTED NOT DETECTED Final   Vibrio species NOT DETECTED NOT DETECTED Final   Vibrio cholerae NOT DETECTED NOT DETECTED Final   Enteroaggregative E coli (EAEC) NOT DETECTED NOT DETECTED Final   Enteropathogenic E coli (EPEC) NOT DETECTED NOT DETECTED Final   Enterotoxigenic E coli (ETEC) NOT DETECTED NOT DETECTED Final   Shiga like toxin producing E coli (STEC) NOT DETECTED NOT DETECTED Final   Shigella/Enteroinvasive E coli (EIEC) NOT DETECTED NOT DETECTED Final   Cryptosporidium NOT DETECTED NOT DETECTED Final   Cyclospora cayetanensis NOT DETECTED NOT DETECTED Final   Entamoeba histolytica NOT DETECTED NOT DETECTED Final   Giardia lamblia NOT DETECTED NOT DETECTED Final   Adenovirus F40/41 NOT DETECTED NOT DETECTED Final   Astrovirus NOT DETECTED NOT DETECTED Final   Norovirus GI/GII NOT DETECTED NOT DETECTED Final   Rotavirus A NOT DETECTED NOT DETECTED Final   Sapovirus (I, II, IV, and V) NOT DETECTED NOT DETECTED Final    Coagulation Studies: No results for input(s): LABPROT, INR in the last 72 hours.  Urinalysis: No results for input(s): COLORURINE, LABSPEC, PHURINE, GLUCOSEU, HGBUR, BILIRUBINUR, KETONESUR, PROTEINUR, UROBILINOGEN, NITRITE, LEUKOCYTESUR in the  last 72 hours.  Invalid input(s): APPERANCEUR    Imaging: No results found.   Medications:    . sodium chloride   Intravenous Once  . sodium chloride   Intravenous Once  . bacitracin   Topical BID  . chlorhexidine  15 mL Mouth Rinse BID  . cholestyramine light  4 g  Oral BID  . citalopram  40 mg Oral Daily  . cloZAPine  200 mg Oral Daily  . famotidine  20 mg Oral Daily  . haloperidol lactate  5 mg Intravenous Once  . heparin  5,000 Units Subcutaneous Q8H  . levothyroxine  50 mcg Oral QAC breakfast  . mesalamine  500 mg Oral TID  . mesalamine  4 g Rectal QHS  . OLANZapine zydis  10 mg Oral BID WC  . predniSONE  40 mg Oral Q breakfast  . simvastatin  20 mg Oral q1800  . sodium chloride flush  10-40 mL Intracatheter Q12H  . tuberculin  5 Units Intradermal Once   sodium chloride, acetaminophen, LORazepam, ondansetron (ZOFRAN) IV  Assessment/ Plan:  45 y.o.white female with a PMHx of Crohn's disease, depression, diabetes mellitus type 2, GERD, hiatal hernia, hyperlipidemia, hypertension, hypothyroidism, obesity, schizophrenia, traumatic brain injury, vitamin D deficiency, who was admitted to [facility] on 09/27/2016   1.  Acute renal failure: CRRT from 11/29 to 12/5. Hemodialysis treatment on 12/8 Acute renal failure sepsis, hypotension, and hypoxemia Presenting creatinine 4.09 on September 19, 2016.  Prior to that, the only known creatinine is 1.16 from January 2014 Serum creatinine remains high  Tunnelled dialysis catheter has been placed PPD placed Continue HD for AKI- outpatient dialysis for acute renal failure - [address] HD based on daily assessment- last treatment Dec 16  2.  Hypokalemia - Likely GI losses - replace as necessary   3. Inflammatory bowel disease- Crohn's colitis - followed by Dr [name] and [facility]    LOS: 19 [name] 12/17/20171:18 PM

## 2016-10-17 LAB — RENAL FUNCTION PANEL
ALBUMIN: 1.9 g/dL — AB (ref 3.5–5.0)
ANION GAP: 6 (ref 5–15)
BUN: 20 mg/dL (ref 6–20)
CO2: 28 mmol/L (ref 22–32)
Calcium: 7.5 mg/dL — ABNORMAL LOW (ref 8.9–10.3)
Chloride: 109 mmol/L (ref 101–111)
Creatinine, Ser: 3.14 mg/dL — ABNORMAL HIGH (ref 0.44–1.00)
GFR calc Af Amer: 19 mL/min — ABNORMAL LOW (ref >60)
GFR calc non Af Amer: 17 mL/min — ABNORMAL LOW (ref >60)
GLUCOSE: 97 mg/dL (ref 65–99)
PHOSPHORUS: 2.6 mg/dL (ref 2.5–4.6)
POTASSIUM: 2.9 mmol/L — AB (ref 3.5–5.1)
Sodium: 143 mmol/L (ref 135–145)

## 2016-10-17 LAB — GLUCOSE, CAPILLARY
GLUCOSE-CAPILLARY: 93 mg/dL (ref 65–99)
Glucose-Capillary: 110 mg/dL — ABNORMAL HIGH (ref 65–99)
Glucose-Capillary: 134 mg/dL — ABNORMAL HIGH (ref 65–99)
Glucose-Capillary: 125 mg/dL — ABNORMAL HIGH (ref 65–99)

## 2016-10-17 LAB — SURGICAL PATHOLOGY

## 2016-10-17 MED ORDER — ENSURE ENLIVE PO LIQD
237.0000 mL | ORAL | Status: DC
  Administered 2016-10-17 – 2016-10-18 (×2): 237 mL via ORAL

## 2016-10-17 MED ORDER — POTASSIUM CHLORIDE 20 MEQ PO PACK
40.0000 meq | PACK | ORAL | Status: AC
  Administered 2016-10-17 (×2): 40 meq via ORAL
  Filled 2016-10-17 (×2): qty 2

## 2016-10-17 MED ORDER — PRO-STAT SUGAR FREE PO LIQD: 30.0000 mL | Freq: Two times a day (BID) | ORAL | Status: DC

## 2016-10-17 NOTE — Progress Notes (Signed)
Nutrition Follow-up  DOCUMENTATION CODES:   Morbid obesity  INTERVENTION:  Provide Pro-Stat 30 ml po BID, each supplement provides 100 kcal and 15 grams of protein. Can be mixed in patient's beverage of choice.  Provide Ensure Enlive po once daily, each supplement provides 350 kcal and 20 grams of protein.   NUTRITION DIAGNOSIS:   Inadequate oral intake related to dysphagia, acute illness as evidenced by meal completion < 50%.  Ongoing.  GOAL:   Patient will meet greater than or equal to 90% of their needs  Progressing.  MONITOR:   PO intake, Supplement acceptance  REASON FOR ASSESSMENT:   Ventilator    ASSESSMENT:   45 y.o.white female with a PMHx of Crohn's disease, depression, diabetes mellitus type 2, GERD, hiatal hernia, hyperlipidemia, hypertension, hypothyroidism, obesity, schizophrenia, traumatic brain injury, vitamin D deficiency, who was admitted to [facility] on 09/27/2016 with septic shock secondary to UTI, severe metabolic acidosis, acute encephalopathy and acute respiratory failure   -After being on BRAT diet last week, patient was put on CLD on 12/14 and then advanced to Soft Diet 12/17.   -Per chart last HD treatment was 12/16 and plan is for ongoing dialysis as needed for AKI based on daily assessment. HD session on 12/16 was 2.5 hours in duration, net UF 0 ml.  Spoke with patient at bedside. She reports her appetite is good now and she is able to finish most of her meals. Denies nausea or abdominal pain. Patient reports she is having loose bowel movements now.   Meal Completion: 75-100% per chart and patient report. In the past 24 hours patient has had 1766 kcal (81% minimum estimated kcal needs) and 56 grams of protein (49% minimum estimated protein needs).   Medications reviewed and include: famotidine, levothyroxine, prednisone 40 mg daily.  Labs reviewed: CBG 93-133 past 24 hrs, Potassium 2.9, Creatinine 3.14.   Weight Trend: current body weight of  115 kg now -7.5 kg from admission. Weight has fluctuated significantly in setting of volume status with CRRT and HD.   Discussed with RN.   Diet Order:  DIET SOFT Room service appropriate? Yes; Fluid consistency: Thin  Skin:  Wound (see comment) (head laceration )  Last BM:  10/17/2016  Height:   Ht Readings from Last 1 Encounters:  09/27/16 5\' 6"  (1.676 m)    Weight:   Wt Readings from Last 1 Encounters:  10/17/16 253 lb 8.5 oz (115 kg)    Ideal Body Weight:  61.3 kg  BMI:  Body mass index is 40.92 kg/m.  Estimated Nutritional Needs:   Kcal:  2175-2360 (MSJ x 1.2-1.3)  Protein:  115-140 grams (1-1.2 grams/kg)  Fluid:  >/= 1.7 L  EDUCATION NEEDS:   Education needs no appropriate at this time  Willey Blade, MS, RD, LDN Pager: [phone] After Hours Pager: [phone]

## 2016-10-17 NOTE — Progress Notes (Signed)
[facility] [place] NAME: [name]    MR#:  [id number]  DATE OF BIRTH:  Jan 07, 1971  SUBJECTIVE:   Diarrhea improved.  No N/V. Tolerating soft diet well.  Urine output good and Cr. Trending down.   REVIEW OF SYSTEMS:    Review of Systems  Constitutional: Negative for chills and fever.  HENT: Negative for congestion and tinnitus.   Eyes: Negative for blurred vision and double vision.  Respiratory: Negative for cough, shortness of breath and wheezing.   Cardiovascular: Negative for chest pain, orthopnea and PND.  Gastrointestinal: Positive for diarrhea. Negative for abdominal pain, nausea and vomiting.  Genitourinary: Negative for dysuria and hematuria.  Neurological: Positive for weakness. Negative for dizziness, sensory change and focal weakness.  All other systems reviewed and are negative.   Nutrition: Mech. Soft.  Tolerating Diet: Yes Tolerating PT: Ambulatory   DRUG ALLERGIES:   Allergies  Allergen Reactions  . Peanut Oil Other (See Comments)    Face turns red  . Risperidone And Related Cough    VITALS:  Blood pressure 120/77, pulse 88, temperature 98 F (36.7 C), temperature source Oral, resp. rate 18, height 5\' 6"  (1.676 m), weight 115 kg (253 lb 8.5 oz), SpO2 96 %.  PHYSICAL EXAMINATION:   Physical Exam  GENERAL:  45 y.o.-year-old obese patient lying in the bed in no acute distress.  EYES: Pupils equal, round, reactive to light and accommodation. No scleral icterus. Extraocular muscles intact.  HEENT: Head atraumatic, normocephalic. Oropharynx and nasopharynx clear.  NECK:  Supple, no jugular venous distention. No thyroid enlargement, no tenderness.  LUNGS: Normal breath sounds bilaterally, no wheezing, rales, rhonchi. No use of accessory muscles of respiration.  CARDIOVASCULAR: S1, S2 normal. No murmurs, rubs, or gallops.  ABDOMEN: Soft, nontender, nondistended. Bowel sounds present. No organomegaly or mass.   EXTREMITIES: No cyanosis, clubbing or edema b/l.    NEUROLOGIC: Cranial nerves II through XII are intact. No focal Motor or sensory deficits b/l.   PSYCHIATRIC: The patient is alert and oriented x 3.  SKIN: No obvious rash, lesion, or ulcer.   Right chest wall HD cath. In place with no acute drainage noted.    LABORATORY PANEL:   CBC  Recent Labs Lab 10/14/16 0448 10/14/16 1809  WBC 10.1  --   HGB 7.3* 8.8*  HCT 22.0* 25.7*  PLT 518*  --    ------------------------------------------------------------------------------------------------------------------  Chemistries   Recent Labs Lab 10/14/16 0448  10/17/16 0547  NA 139  < > 143  K 3.2*  < > 2.9*  CL 109  < > 109  CO2 20*  < > 28  GLUCOSE 88  < > 97  BUN 58*  < > 20  CREATININE 6.59*  < > 3.14*  CALCIUM 7.5*  < > 7.5*  MG 1.9  --   --   < > = values in this interval not displayed. ------------------------------------------------------------------------------------------------------------------  Cardiac Enzymes No results for input(s): TROPONINI in the last 168 hours. ------------------------------------------------------------------------------------------------------------------  RADIOLOGY:  No results found.   ASSESSMENT AND PLAN:   45 year old female with past medical history of schizophrenia, hypothyroidism, hypertension, hyperlipidemia, GERD, diabetes who presented to the hospital initially due to altered mental status.  1. Altered mental status-initially patient was admitted to the hospital due to shock and was obtunded. - mental status back to baseline.   2. Shock-initially thought to be septic in nature but that has been ruled out. -hypovolemic in nature secondary to  severe diarrhea and now hemodynamics much improved. -Echocardiogram showed normal ejection fraction. Cultures so far have been negative.   3. Acute respiratory failure with hypoxia-patient was intubated due to altered mental status  and obtundation. Patient has been extubated since December 1 and O2 sats are stable. No acute respiratory issues.  4. Acute kidney injury-secondary to severe sepsis and ATN. Patient was on CRRT when she was hemodynamically unstable. Now has been on intermittent HD.  - Cr. Trending down and made 4 L of urine overnight.  - This with nephrology and if her urine output remains good and creatinine continues 10 down she may not need hemodialysis. -Patient did have a PPD done on 10/10/2016 which has been read and showed no induration in preparation for possible outpatient hemodialysis placement. - cont. Further care as per nephrology.   5. Crohn's disease-patient had severe diarrhea and hypovolemia. Continues to have diarrhea but improving. Cont. Cholestyramine.  -She had a colonoscopy that showed skip areas with ulceration and severe mucosal edema. -As per gastroenterology to continue oral steroids, Pentasa suppositories, and oral Pentasa upon discharge and follow up with gastroenterology as an outpatient. - tolerating soft diet well.   6. History of schizo affective disorder-continue Zyprexa, Clozaril.    7. Hypothyroidism-continue Synthroid.  Possible d/c to SNF tomorrow.   All the records are reviewed and case discussed with Care Management/Social Worker. Management plans discussed with the patient, family and they are in agreement.  CODE STATUS: Full code  DVT Prophylaxis: Hep. SQ  TOTAL TIME TAKING CARE OF THIS PATIENT: 30 minutes.   POSSIBLE D/C IN 1-2 DAYS, DEPENDING ON CLINICAL CONDITION.   Henreitta Leber M.D on 10/17/2016 at 3:26 PM  Between 7am to 6pm - Pager - [phone]  After 6pm go to [email or web address] - Proofreader  [facility]  Office  [phone]  CC: Primary care physician; Lorelee Market, MD

## 2016-10-17 NOTE — Care Management (Signed)
PPD information resent to [facility] brawner

## 2016-10-17 NOTE — Progress Notes (Signed)
Ppd assessed on 10/11/19. No redness nor induration noted

## 2016-10-17 NOTE — Progress Notes (Signed)
[facility]  ROUNDING NOTE   Subjective:  Patient doing much better since the last time I saw her. She is awake and alert and following commands. Good urine output noted over the preceding 24 hours. Creatinine today is down to 3.14. It appears that she's experiencing renal recovery.    Objective:  Vital signs in last 24 hours:  Temp:  [98 F (36.7 C)-98.4 F (36.9 C)] 98 F (36.7 C) (12/18 1300) Pulse Rate:  [88-96] 88 (12/18 1300) Resp:  [16-18] 18 (12/18 1300) BP: (96-126)/(41-79) 120/77 (12/18 1300) SpO2:  [96 %-98 %] 96 % (12/18 1300) Weight:  [115 kg (253 lb 8.5 oz)] 115 kg (253 lb 8.5 oz) (12/18 0507)  Weight change: -1 kg (-2 lb 3.3 oz) Filed Weights   10/15/16 1419 10/16/16 0500 10/17/16 0507  Weight: 116 kg (255 lb 11.7 oz) 115.8 kg (255 lb 4.7 oz) 115 kg (253 lb 8.5 oz)    Intake/Output: I/O last 3 completed shifts: In: 2100 [P.O.:2080; I.V.:20] Out: 7200 [Urine:7200]   Intake/Output this shift:  Total I/O In: 0  Out: 800 [Urine:800]  Physical Exam: General: No acute distress  Head: OM moist  Eyes: Anicteric  Neck: Supple, trachea midline  Lungs:  clear, normal effort , room air  Heart: S1S2 No rubs   Abdomen:  Soft, non tender, bowel sounds present  Extremities: Trace peripheral edema.  Neurologic: Alert, able to answer questions  Skin: No lesions  Access: R IJ permcath    Basic Metabolic Panel:  Recent Labs Lab 10/12/16 0440 10/13/16 0514 10/14/16 0448 10/15/16 1420 10/17/16 0547  NA 135 133* 139 140 143  K 3.6 3.2* 3.2* 3.5 2.9*  CL 104 102 109 110 109  CO2 22 20* 20* 22 28  GLUCOSE 92 85 88 136* 97  BUN 61* 63* 58* 31* 20  CREATININE 6.68* 6.64* 6.59* 4.26* 3.14*  CALCIUM 7.8* 7.3* 7.5* 7.2* 7.5*  MG  --   --  1.9  --   --   PHOS 5.3*  --   --  3.6 2.6    Liver Function Tests:  Recent Labs Lab 10/12/16 0440 10/15/16 1420 10/17/16 0547  ALBUMIN 2.0* 1.8* 1.9*   No results for input(s): LIPASE, AMYLASE in the  last 168 hours. No results for input(s): AMMONIA in the last 168 hours.  CBC:  Recent Labs Lab 10/12/16 0440 10/13/16 0514 10/14/16 0127 10/14/16 0448 10/14/16 1809  WBC 10.4  --   --  10.1  --   NEUTROABS 4.9  --   --  5.4  --   HGB 7.0* 6.0* 7.5* 7.3* 8.8*  HCT 20.3*  --  21.9* 22.0* 25.7*  MCV 93.0  --   --  90.0  --   PLT 433  --   --  518*  --     Cardiac Enzymes: No results for input(s): CKTOTAL, CKMB, CKMBINDEX, TROPONINI in the last 168 hours.  BNP: Invalid input(s): POCBNP  CBG:  Recent Labs Lab 10/16/16 1138 10/16/16 1628 10/16/16 2121 10/17/16 0754 10/17/16 1135  GLUCAP 111* 133* 124* 86 110*    Microbiology: Results for orders placed or performed during the hospital encounter of 09/27/16  Blood Culture (routine x 2)     Status: None   Collection Time: 09/27/16  6:02 AM  Result Value Ref Range Status   Specimen Description BLOOD  R AC  Final   Special Requests   Final    BOTTLES DRAWN AEROBIC AND ANAEROBIC  AER  3 ML ANA 4 ML   Culture NO GROWTH 5 DAYS  Final   Report Status 10/02/2016 FINAL  Final  Blood Culture (routine x 2)     Status: None   Collection Time: 09/27/16  6:03 AM  Result Value Ref Range Status   Specimen Description BLOOD  R ARM  Final   Special Requests   Final    BOTTLES DRAWN AEROBIC AND ANAEROBIC  AER 6 ML ANA 10 ML   Culture NO GROWTH 5 DAYS  Final   Report Status 10/02/2016 FINAL  Final  Urine culture     Status: None   Collection Time: 09/27/16  9:30 AM  Result Value Ref Range Status   Specimen Description URINE, RANDOM  Final   Special Requests NONE  Final   Culture NO GROWTH Performed at [facility]   Final   Report Status 09/28/2016 FINAL  Final  MRSA PCR Screening     Status: None   Collection Time: 09/27/16 11:09 AM  Result Value Ref Range Status   MRSA by PCR NEGATIVE NEGATIVE Final    Comment:        The GeneXpert MRSA Assay (FDA approved for NASAL specimens only), is one component of  a comprehensive MRSA colonization surveillance program. It is not intended to diagnose MRSA infection nor to guide or monitor treatment for MRSA infections.   C difficile quick scan w PCR reflex     Status: None   Collection Time: 10/04/16 12:20 AM  Result Value Ref Range Status   C Diff antigen NEGATIVE NEGATIVE Final   C Diff toxin NEGATIVE NEGATIVE Final   C Diff interpretation No C. difficile detected.  Final  Gastrointestinal Panel by PCR , Stool     Status: None   Collection Time: 10/08/16  7:00 PM  Result Value Ref Range Status   Campylobacter species NOT DETECTED NOT DETECTED Final   Plesimonas shigelloides NOT DETECTED NOT DETECTED Final   Salmonella species NOT DETECTED NOT DETECTED Final   Yersinia enterocolitica NOT DETECTED NOT DETECTED Final   Vibrio species NOT DETECTED NOT DETECTED Final   Vibrio cholerae NOT DETECTED NOT DETECTED Final   Enteroaggregative E coli (EAEC) NOT DETECTED NOT DETECTED Final   Enteropathogenic E coli (EPEC) NOT DETECTED NOT DETECTED Final   Enterotoxigenic E coli (ETEC) NOT DETECTED NOT DETECTED Final   Shiga like toxin producing E coli (STEC) NOT DETECTED NOT DETECTED Final   Shigella/Enteroinvasive E coli (EIEC) NOT DETECTED NOT DETECTED Final   Cryptosporidium NOT DETECTED NOT DETECTED Final   Cyclospora cayetanensis NOT DETECTED NOT DETECTED Final   Entamoeba histolytica NOT DETECTED NOT DETECTED Final   Giardia lamblia NOT DETECTED NOT DETECTED Final   Adenovirus F40/41 NOT DETECTED NOT DETECTED Final   Astrovirus NOT DETECTED NOT DETECTED Final   Norovirus GI/GII NOT DETECTED NOT DETECTED Final   Rotavirus A NOT DETECTED NOT DETECTED Final   Sapovirus (I, II, IV, and V) NOT DETECTED NOT DETECTED Final    Coagulation Studies: No results for input(s): LABPROT, INR in the last 72 hours.  Urinalysis: No results for input(s): COLORURINE, LABSPEC, PHURINE, GLUCOSEU, HGBUR, BILIRUBINUR, KETONESUR, PROTEINUR, UROBILINOGEN, NITRITE,  LEUKOCYTESUR in the last 72 hours.  Invalid input(s): APPERANCEUR    Imaging: No results found.   Medications:    . sodium chloride   Intravenous Once  . sodium chloride   Intravenous Once  . bacitracin   Topical BID  . chlorhexidine  15 mL Mouth Rinse BID  .  cholestyramine light  4 g Oral BID  . citalopram  40 mg Oral Daily  . cloZAPine  200 mg Oral Daily  . famotidine  20 mg Oral Daily  . feeding supplement (ENSURE ENLIVE)  237 mL Oral Q24H  . feeding supplement (PRO-STAT SUGAR FREE 64)  30 mL Oral BID  . haloperidol lactate  5 mg Intravenous Once  . heparin  5,000 Units Subcutaneous Q8H  . levothyroxine  50 mcg Oral QAC breakfast  . mesalamine  500 mg Oral TID  . mesalamine  4 g Rectal QHS  . OLANZapine zydis  10 mg Oral BID WC  . potassium chloride  40 mEq Oral Q4H  . predniSONE  40 mg Oral Q breakfast  . simvastatin  20 mg Oral q1800  . sodium chloride flush  10-40 mL Intracatheter Q12H  . tuberculin  5 Units Intradermal Once   sodium chloride, acetaminophen, LORazepam, ondansetron (ZOFRAN) IV  Assessment/ Plan:  45 y.o.white female with a PMHx of Crohn's disease, depression, diabetes mellitus type 2, GERD, hiatal hernia, hyperlipidemia, hypertension, hypothyroidism, obesity, schizophrenia, traumatic brain injury, vitamin D deficiency, who was admitted to [facility] on 09/27/2016   1.  Acute renal failure: CRRT from 11/29 to 12/5. Hemodialysis treatment on 12/8 Acute renal failure sepsis, hypotension, and hypoxemia Presenting creatinine 4.09 on September 19, 2016.  Prior to that, the only known creatinine is 1.16 from January 2014 last dialysis treatment Dec 16 - it appears as the patient's renal function is improving.  Creatinine currently 3.14.  Good urine output noted over the preceding 24 hours.  No further indication for dialysis at the moment.  Followup renal function trend tomorrow.  2.  Hypokalemia - Likely GI losses - patient receiving potassium chloride 40 mEq  by mouth every 4 hours.  Continue to monitor serum potassium closely.   3. Inflammatory bowel disease- Crohn's colitis - followed by Dr [name] and [facility]    LOS: [place], Jerrold Haskell 12/18/20173:58 PM

## 2016-10-17 NOTE — Progress Notes (Signed)
   10/10/16 1200  PPD Results  Does patient have an induration at the injection site? No (verified with pharmacy ([facility]))  Induration(mm) 0 mm

## 2016-10-18 ENCOUNTER — Encounter
Admission: EM | Disposition: A | Discharge status: Skilled Nursing Facility | Payer: Self-pay | Source: Home / Self Care | Attending: Internal Medicine

## 2016-10-18 DIAGNOSIS — N186 End stage renal disease: Secondary | ICD-10-CM

## 2016-10-18 HISTORY — PX: PERIPHERAL VASCULAR CATHETERIZATION: SHX172C

## 2016-10-18 LAB — BASIC METABOLIC PANEL
Anion gap: 6 (ref 5–15)
BUN: 26 mg/dL — AB (ref 6–20)
CALCIUM: 7.8 mg/dL — AB (ref 8.9–10.3)
CHLORIDE: 111 mmol/L (ref 101–111)
CO2: 24 mmol/L (ref 22–32)
CREATININE: 2.65 mg/dL — AB (ref 0.44–1.00)
GFR calc Af Amer: 24 mL/min — ABNORMAL LOW (ref >60)
GFR calc non Af Amer: 21 mL/min — ABNORMAL LOW (ref >60)
Glucose, Bld: 100 mg/dL — ABNORMAL HIGH (ref 65–99)
Potassium: 3.7 mmol/L (ref 3.5–5.1)
Sodium: 141 mmol/L (ref 135–145)

## 2016-10-18 LAB — GLUCOSE, CAPILLARY
Glucose-Capillary: 98 mg/dL (ref 65–99)
Glucose-Capillary: 97 mg/dL (ref 65–99)

## 2016-10-18 LAB — MAGNESIUM: MAGNESIUM: 1.5 mg/dL — AB (ref 1.7–2.4)

## 2016-10-18 SURGERY — DIALYSIS/PERMA CATHETER REMOVAL
Anesthesia: LOCAL

## 2016-10-18 MED ORDER — MESALAMINE 4 G RE ENEM: 4.0000 g | ENEMA | Freq: Every day | RECTAL | 1 refills | Status: DC

## 2016-10-18 MED ORDER — LIDOCAINE HCL (PF) 1 % IJ SOLN
INTRAMUSCULAR | Status: DC | PRN
  Administered 2016-10-18: 30 mL

## 2016-10-18 MED ORDER — MAGNESIUM SULFATE 2 GM/50ML IV SOLN
2.0000 g | Freq: Once | INTRAVENOUS | Status: DC
  Filled 2016-10-18: qty 50

## 2016-10-18 MED ORDER — CHOLESTYRAMINE LIGHT 4 G PO PACK: 4.0000 g | PACK | Freq: Two times a day (BID) | ORAL | 0 refills | Status: DC

## 2016-10-18 MED ORDER — PREDNISONE 10 MG PO TABS: ORAL_TABLET | ORAL | 0 refills | Status: DC

## 2016-10-18 SURGICAL SUPPLY — 2 items
PREP CHG 10.5 TEAL (MISCELLANEOUS) ×2 IMPLANT
TRAY LACERAT/PLASTIC (MISCELLANEOUS) ×2 IMPLANT

## 2016-10-18 NOTE — Care Management (Signed)
Plan for patient to discharge today.  To return to group home with home health services.  Patient does not have preference of home health agency.  Per group home preference is [facility].  Referral for PT made to [facility] with [place].  It has been determined that patient will not require outpatient HD.  I have notified Alda Lea for [facility] of discharge.  RNCM signing off.

## 2016-10-18 NOTE — NC FL2 (Signed)
[place] LEVEL OF CARE SCREENING TOOL     IDENTIFICATION  Patient Name: [name] Birthdate: June 11, 1971 Sex: female Admission Date (Current Location): 09/27/2016  [facility] and [place] Number:  Engineering geologist and Address:  [facility], [address], [place], [place] [address]      Provider Number: [id number]  Attending Physician Name and Address:  Henreitta Leber, MD  Relative Name and Phone Number:       Current Level of Care: Hospital Recommended Level of Care: [facility] Prior Approval Number:    Date Approved/Denied:   PASRR Number:    Discharge Plan:  (group home)    Current Diagnoses: Patient Active Problem List   Diagnosis Date Noted  . Scalp laceration   . Pressure injury of skin 10/02/2016  . 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 Acute renal failure (ARF) ([place])   . Respiratory failure ([place]) 09/27/2016  . Other sign and symptom in breast     Orientation RESPIRATION BLADDER Height & Weight     Self, Time, Situation, Place  Normal Incontinent Weight: 255 lb 1.2 oz (115.7 kg) Height:  5\' 6"  (167.6 cm)  BEHAVIORAL SYMPTOMS/MOOD NEUROLOGICAL BOWEL NUTRITION STATUS   (none)   Incontinent Diet (NDDS 2 with nectar thick liquids)  AMBULATORY STATUS COMMUNICATION OF NEEDS Skin   Supervision Verbally Normal                       Personal Care Assistance Level of Assistance  Bathing, Dressing Bathing Assistance: Independent   Dressing Assistance: Independent     Functional Limitations Info             SPECIAL CARE FACTORS FREQUENCY  PT (By licensed PT)                    Contractures Contractures Info: Not present    Additional Factors Info  Allergies, Psychotropic   Allergies Info: Peanut Oil, Risperidone And Related Psychotropic Info: Celexa, Risperadone (her allergy only causes a cough), Clozaril, Ativan         Medication List    STOP taking these  medications   furosemide 40 MG tablet Commonly known as:  LASIX  lisinopril 5 MG tablet Commonly known as:  PRINIVIL,ZESTRIL  metFORMIN 500 MG tablet Commonly known as:  GLUCOPHAGE  spironolactone 25 MG tablet Commonly known as:  ALDACTONE    TAKE these medications   acetaminophen 325 MG tablet Commonly known as:  TYLENOL Take 650 mg by mouth every 6 (six) hours as needed for mild pain. Every 4 to 6 hours PRN  albuterol 108 (90 Base) MCG/ACT inhaler Commonly known as:  PROVENTIL HFA;VENTOLIN HFA Inhale 1 puff into the lungs 4 (four) times daily as needed for wheezing or shortness of breath.  calcium carbonate 1500 (600 Ca) MG Tabs tablet Commonly known as:  OSCAL Take 600 mg of elemental calcium by mouth 2 (two) times daily with a meal.  Cholecalciferol 5000 units Tabs Take 5,000 Units by mouth daily.  cholestyramine light 4 g packet Commonly known as:  PREVALITE Take 1 packet (4 g total) by mouth 2 (two) times daily.  citalopram 40 MG tablet Commonly known as:  CELEXA Take 40 mg by mouth daily.  cloZAPine 100 MG tablet Commonly known as:  CLOZARIL Take 200 mg by mouth at bedtime.  diphenhydrAMINE 50 MG capsule Commonly known as:  BENADRYL Take 50 mg by mouth at bedtime.  gemfibrozil  600 MG tablet Commonly known as:  LOPID Take 600 mg by mouth 2 (two) times daily.  levothyroxine 25 MCG tablet Commonly known as:  SYNTHROID, LEVOTHROID Take 25 mcg by mouth every morning.  LORazepam 0.5 MG tablet Commonly known as:  ATIVAN Take 0.5 mg by mouth every 4 (four) hours as needed for anxiety.  LORazepam 1 MG tablet Commonly known as:  ATIVAN Take 1 mg by mouth every morning.  mesalamine 0.375 g 24 hr capsule Commonly known as:  APRISO Take 1,500 mg by mouth every morning. What changed:  Another medication with the same name was added. Make sure you understand how and when to take each.  mesalamine 4 g enema Commonly known as:  ROWASA Place 60 mLs (4 g total) rectally at  bedtime. What changed:  You were already taking a medication with the same name, and this prescription was added. Make sure you understand how and when to take each.  omega-3 acid ethyl esters 1 g capsule Commonly known as:  LOVAZA Take 2 g by mouth every morning.  pantoprazole 40 MG tablet Commonly known as:  PROTONIX Take 40 mg by mouth daily.  potassium chloride 10 MEQ tablet Commonly known as:  K-DUR Take 20 mEq by mouth 2 (two) times daily.  predniSONE 10 MG tablet Commonly known as:  DELTASONE 4 tabs Daily X 4 days, then 3.5 tabs daily X 7 days, then 3 tabs daily X 7 days, then 2.5 tabs daily X 7 days, then 2 tabs daily X 7 days, then 1.5 tabs daily X 7 days, then 1 tab daily X 7 days then STOP.  pseudoephedrine-guaifenesin 60-600 MG 12 hr tablet Commonly known as:  MUCINEX D Take 2 tablets by mouth every 12 (twelve) hours as needed for congestion.  RISPERDAL M-TAB 1 MG disintegrating tablet Generic drug:  risperiDONE Take 1 mg by mouth 2 (two) times daily.  simvastatin 20 MG tablet Commonly known as:  ZOCOR Take 20 mg by mouth at bedtime.  vitamin B-12 1000 MCG tablet Commonly known as:  CYANOCOBALAMIN Take 1,000 mcg by mouth daily.       Relevant Imaging Results:  Relevant Lab Results:   Additional Information SS# [id number]; Will need home health PT through [place], LCSW

## 2016-10-18 NOTE — Discharge Summary (Signed)
[facility] NAME: [name]    MR#:  [id number]  DATE OF BIRTH:  Jan 21, 1971  DATE OF ADMISSION:  09/27/2016 ADMITTING PHYSICIAN: Flora Lipps, MD  DATE OF DISCHARGE: 10/18/2016  PRIMARY CARE PHYSICIAN: Lorelee Market, MD    ADMISSION DIAGNOSIS:  Lactic acidosis [E87.2] Unresponsive [R41.89] Encounter for intubation [Z01.818] Severe sepsis ([place]) [A41.9, R65.20] Acute renal failure, unspecified acute renal failure type ([place]) [N17.9]  DISCHARGE DIAGNOSIS:  Principal Problem:   Undifferentiated schizophrenia ([place]) Active Problems:   Respiratory failure ([place])   Acute renal failure (ARF) (HCC)   Pressure injury of skin   Scalp laceration   SECONDARY DIAGNOSIS:   Past Medical History:  Diagnosis Date  . Abnormal mammogram, unspecified 2013   Prev. cytology,hypercellular smears without evidence of malignant cells. The cytopathologist questioned if samples truly representative. Care taken during sampling and is felt to be representative.  . Breast screening, unspecified 2013  . Broken leg    age 55  . Cellulitis   . Crohn's disease ([place]) 2013  . Depression   . Diabetes mellitus without complication (HCC)    non insulin dependent  . Early menopause   . Edema   . GERD (gastroesophageal reflux disease)   . Hiatal hernia 2013  . Hyperlipidemia   . Hypertension   . Hypothyroidism   . 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 Obesity, unspecified 2013  . Other sign and symptom in breast 2013   Right bst US,lower outer quadrant,A single 0.3x0.4x0.6cm hypoechoic mass with slightly lobulated borders with adjacent 0.3x0.4x0.5cm mass was noted. The 1st was 5cm from nipple, 2nd at 8 cm from the nipple. The previous lesion aspirate was at 3 0'clock position. These lesions are thought to account for the mammographic abnormality. Minimal interval change on [place].   [facility])   . Rib fracture     age 74  . Schizophrenia ([place])   . TBI (traumatic brain injury) ([place])   . Thyroid disease    hypothyroid  . Vitamin D deficiency     HOSPITAL COURSE:   45 year old female with past medical history of schizophrenia, hypothyroidism, hypertension, hyperlipidemia, GERD, diabetes who presented to the hospital initially due to altered mental status.  1. Altered mental status-initially patient was admitted to the hospital due to shock and was obtunded. - pt. Was treated for the shock with IV fluids and her hemodynamics have improved and back to baseline now.  Her mental status is stable now  2. Shock-initially thought to be septic in nature but that has been ruled out. - it was hypovolemic in nature secondary to severe diarrhea from Crohn's colitis and now hemodynamics much improved. -Echocardiogram showed normal ejection fraction. Cultures (-) and pt. Is off abx now.   3. Acute respiratory failure with hypoxia-patient was intubated due to altered mental status and obtundation. Patient has been extubated since 09/30/16 and O2 sats are stable. No acute respiratory issues presently.  4. Acute kidney injury-secondary to severe sepsis and ATN. Patient was on CRRT when she was hemodynamically unstable and then weaned to intermitten HD.  - the plan was to have pt. On HD intermittently as outpatient for ARF but her Cr. Has continued to improve and she is making good urine.  She was seen by Nephrology and as per them she does not need HD now.  - She is to follow up with Nephrology as outpatient to check  her renal function.   5. Crohn's disease-patient had severe diarrhea and hypovolemia.  -She had a colonoscopy that showed skip areas with ulceration and severe mucosal edema. -She was started on Oral Prednisone, Pentasa suppositories, and oral Pentasa and has clinically improved.  - tolerating soft diet well now and being discharged with follow up w. Dr. Gustavo Lah as outpatient.  6. History of  schizo affective disorder- Pt. Will continue Clozaril, risperdal.  - she was seen Psych and should have her [place] checked as outpatient. Her counts are stable.    7. Hypothyroidism- she will continue Synthroid.  DISCHARGE CONDITIONS:   Stable  CONSULTS OBTAINED:  Treatment Team:  Anthonette Legato, MD Gonzella Lex, MD Lucilla Lame, MD Algernon Huxley, MD  DRUG ALLERGIES:   Allergies  Allergen Reactions  . Peanut Oil Other (See Comments)    Face turns red  . Risperidone And Related Cough    DISCHARGE MEDICATIONS:   Allergies as of 10/18/2016      Reactions   Peanut Oil Other (See Comments)   Face turns red   Risperidone And Related Cough      Medication List    STOP taking these medications   furosemide 40 MG tablet Commonly known as:  LASIX   lisinopril 5 MG tablet Commonly known as:  PRINIVIL,ZESTRIL   metFORMIN 500 MG tablet Commonly known as:  GLUCOPHAGE   spironolactone 25 MG tablet Commonly known as:  ALDACTONE     TAKE these medications   acetaminophen 325 MG tablet Commonly known as:  TYLENOL Take 650 mg by mouth every 6 (six) hours as needed for mild pain. Every 4 to 6 hours PRN   albuterol 108 (90 Base) MCG/ACT inhaler Commonly known as:  PROVENTIL HFA;VENTOLIN HFA Inhale 1 puff into the lungs 4 (four) times daily as needed for wheezing or shortness of breath.   calcium carbonate 1500 (600 Ca) MG Tabs tablet Commonly known as:  OSCAL Take 600 mg of elemental calcium by mouth 2 (two) times daily with a meal.   Cholecalciferol 5000 units Tabs Take 5,000 Units by mouth daily.   cholestyramine light 4 g packet Commonly known as:  PREVALITE Take 1 packet (4 g total) by mouth 2 (two) times daily.   citalopram 40 MG tablet Commonly known as:  CELEXA Take 40 mg by mouth daily.   cloZAPine 100 MG tablet Commonly known as:  CLOZARIL Take 200 mg by mouth at bedtime.   diphenhydrAMINE 50 MG capsule Commonly known as:  BENADRYL Take 50 mg by mouth  at bedtime.   gemfibrozil 600 MG tablet Commonly known as:  LOPID Take 600 mg by mouth 2 (two) times daily.   levothyroxine 25 MCG tablet Commonly known as:  SYNTHROID, LEVOTHROID Take 25 mcg by mouth every morning.   LORazepam 0.5 MG tablet Commonly known as:  ATIVAN Take 0.5 mg by mouth every 4 (four) hours as needed for anxiety.   LORazepam 1 MG tablet Commonly known as:  ATIVAN Take 1 mg by mouth every morning.   mesalamine 0.375 g 24 hr capsule Commonly known as:  APRISO Take 1,500 mg by mouth every morning. What changed:  Another medication with the same name was added. Make sure you understand how and when to take each.   mesalamine 4 g enema Commonly known as:  ROWASA Place 60 mLs (4 g total) rectally at bedtime. What changed:  You were already taking a medication with the same name, and this prescription was added.  Make sure you understand how and when to take each.   omega-3 acid ethyl esters 1 g capsule Commonly known as:  LOVAZA Take 2 g by mouth every morning.   pantoprazole 40 MG tablet Commonly known as:  PROTONIX Take 40 mg by mouth daily.   potassium chloride 10 MEQ tablet Commonly known as:  K-DUR Take 20 mEq by mouth 2 (two) times daily.   predniSONE 10 MG tablet Commonly known as:  DELTASONE 4 tabs Daily X 4 days, then 3.5 tabs daily X 7 days, then 3 tabs daily X 7 days, then 2.5 tabs daily X 7 days, then 2 tabs daily X 7 days, then 1.5 tabs daily X 7 days, then 1 tab daily X 7 days then STOP.   pseudoephedrine-guaifenesin 60-600 MG 12 hr tablet Commonly known as:  MUCINEX D Take 2 tablets by mouth every 12 (twelve) hours as needed for congestion.   RISPERDAL M-TAB 1 MG disintegrating tablet Generic drug:  risperiDONE Take 1 mg by mouth 2 (two) times daily.   simvastatin 20 MG tablet Commonly known as:  ZOCOR Take 20 mg by mouth at bedtime.   vitamin B-12 1000 MCG tablet Commonly known as:  CYANOCOBALAMIN Take 1,000 mcg by mouth daily.          DISCHARGE INSTRUCTIONS:   DIET:  Cardiac diet Carb control  DISCHARGE CONDITION:  Stable  ACTIVITY:  Activity as tolerated  OXYGEN:  Home Oxygen: No.   Oxygen Delivery: room air  DISCHARGE LOCATION:  group home with Home Health services.   If you experience worsening of your admission symptoms, develop shortness of breath, life threatening emergency, suicidal or homicidal thoughts you must seek medical attention immediately by calling 911 or calling your MD immediately  if symptoms less severe.  You Must read complete instructions/literature along with all the possible adverse reactions/side effects for all the Medicines you take and that have been prescribed to you. Take any new Medicines after you have completely understood and accpet all the possible adverse reactions/side effects.   Please note  You were cared for by a hospitalist during your hospital stay. If you have any questions about your discharge medications or the care you received while you were in the hospital after you are discharged, you can call the unit and asked to speak with the hospitalist on call if the hospitalist that took care of you is not available. Once you are discharged, your primary care physician will handle any further medical issues. Please note that NO REFILLS for any discharge medications will be authorized once you are discharged, as it is imperative that you return to your primary care physician (or establish a relationship with a primary care physician if you do not have one) for your aftercare needs so that they can reassess your need for medications and monitor your lab values.     Today   Diarrhea improved.  Renal function improving and making good urine.  D/c to group home today.   VITAL SIGNS:  Blood pressure 108/68, pulse 90, temperature 98.3 F (36.8 C), temperature source Oral, resp. rate 18, height 5\' 6"  (1.676 m), weight 115.7 kg (255 lb 1.2 oz), SpO2 99 %.  I/O:     Intake/Output Summary (Last 24 hours) at 10/18/16 1415 Last data filed at 10/18/16 1404  Gross per 24 hour  Intake              720 ml  Output  1800 ml  Net            -1080 ml    PHYSICAL EXAMINATION:   Physical Exam  GENERAL:  45 y.o.-year-old obese patient lying in the bed in no acute distress.  EYES: Pupils equal, round, reactive to light and accommodation. No scleral icterus. Extraocular muscles intact.  HEENT: Head atraumatic, normocephalic. Oropharynx and nasopharynx clear.  NECK:  Supple, no jugular venous distention. No thyroid enlargement, no tenderness.  LUNGS: Normal breath sounds bilaterally, no wheezing, rales, rhonchi. No use of accessory muscles of respiration.  CARDIOVASCULAR: S1, S2 normal. No murmurs, rubs, or gallops.  ABDOMEN: Soft, nontender, nondistended. Bowel sounds present. No organomegaly or mass.  EXTREMITIES: No cyanosis, clubbing or edema b/l.    NEUROLOGIC: Cranial nerves II through XII are intact. No focal Motor or sensory deficits b/l.   PSYCHIATRIC: The patient is alert and oriented x 3.  SKIN: No obvious rash, lesion, or ulcer   DATA REVIEW:   CBC  Recent Labs Lab 10/14/16 0448 10/14/16 1809  WBC 10.1  --   HGB 7.3* 8.8*  HCT 22.0* 25.7*  PLT 518*  --     Chemistries   Recent Labs Lab 10/18/16 0506  NA 141  K 3.7  CL 111  CO2 24  GLUCOSE 100*  BUN 26*  CREATININE 2.65*  CALCIUM 7.8*  MG 1.5*    Cardiac Enzymes No results for input(s): TROPONINI in the last 168 hours.  Microbiology Results  Results for orders placed or performed during the hospital encounter of 09/27/16  Blood Culture (routine x 2)     Status: None   Collection Time: 09/27/16  6:02 AM  Result Value Ref Range Status   Specimen Description BLOOD  R AC  Final   Special Requests   Final    BOTTLES DRAWN AEROBIC AND ANAEROBIC  AER 3 ML ANA 4 ML   Culture NO GROWTH 5 DAYS  Final   Report Status 10/02/2016 FINAL  Final  Blood Culture  (routine x 2)     Status: None   Collection Time: 09/27/16  6:03 AM  Result Value Ref Range Status   Specimen Description BLOOD  R ARM  Final   Special Requests   Final    BOTTLES DRAWN AEROBIC AND ANAEROBIC  AER 6 ML ANA 10 ML   Culture NO GROWTH 5 DAYS  Final   Report Status 10/02/2016 FINAL  Final  Urine culture     Status: None   Collection Time: 09/27/16  9:30 AM  Result Value Ref Range Status   Specimen Description URINE, RANDOM  Final   Special Requests NONE  Final   Culture NO GROWTH Performed at [facility]   Final   Report Status 09/28/2016 FINAL  Final  MRSA PCR Screening     Status: None   Collection Time: 09/27/16 11:09 AM  Result Value Ref Range Status   MRSA by PCR NEGATIVE NEGATIVE Final    Comment:        The GeneXpert MRSA Assay (FDA approved for NASAL specimens only), is one component of a comprehensive MRSA colonization surveillance program. It is not intended to diagnose MRSA infection nor to guide or monitor treatment for MRSA infections.   C difficile quick scan w PCR reflex     Status: None   Collection Time: 10/04/16 12:20 AM  Result Value Ref Range Status   C Diff antigen NEGATIVE NEGATIVE Final   C Diff toxin NEGATIVE NEGATIVE Final  C Diff interpretation No C. difficile detected.  Final  Gastrointestinal Panel by PCR , Stool     Status: None   Collection Time: 10/08/16  7:00 PM  Result Value Ref Range Status   Campylobacter species NOT DETECTED NOT DETECTED Final   Plesimonas shigelloides NOT DETECTED NOT DETECTED Final   Salmonella species NOT DETECTED NOT DETECTED Final   Yersinia enterocolitica NOT DETECTED NOT DETECTED Final   Vibrio species NOT DETECTED NOT DETECTED Final   Vibrio cholerae NOT DETECTED NOT DETECTED Final   Enteroaggregative E coli (EAEC) NOT DETECTED NOT DETECTED Final   Enteropathogenic E coli (EPEC) NOT DETECTED NOT DETECTED Final   Enterotoxigenic E coli (ETEC) NOT DETECTED NOT DETECTED Final   Shiga like  toxin producing E coli (STEC) NOT DETECTED NOT DETECTED Final   Shigella/Enteroinvasive E coli (EIEC) NOT DETECTED NOT DETECTED Final   Cryptosporidium NOT DETECTED NOT DETECTED Final   Cyclospora cayetanensis NOT DETECTED NOT DETECTED Final   Entamoeba histolytica NOT DETECTED NOT DETECTED Final   Giardia lamblia NOT DETECTED NOT DETECTED Final   Adenovirus F40/41 NOT DETECTED NOT DETECTED Final   Astrovirus NOT DETECTED NOT DETECTED Final   Norovirus GI/GII NOT DETECTED NOT DETECTED Final   Rotavirus A NOT DETECTED NOT DETECTED Final   Sapovirus (I, II, IV, and V) NOT DETECTED NOT DETECTED Final    RADIOLOGY:  No results found.    Management plans discussed with the patient, family and they are in agreement.  CODE STATUS:     Code Status Orders        Start     Ordered   09/27/16 0844  Full code  Continuous     09/27/16 0845    Code Status History    Date Active Date Inactive Code Status Order ID Comments User Context   This patient has a current code status but no historical code status.      TOTAL TIME TAKING CARE OF THIS PATIENT: 45 minutes.    Henreitta Leber M.D on 10/18/2016 at 2:15 PM  Between 7am to 6pm - Pager - [phone]  After 6pm go to [email or web address] - Proofreader  [facility]  Office  [phone]  CC: Primary care physician; Lorelee Market, MD

## 2016-10-18 NOTE — Progress Notes (Signed)
Physical Therapy Treatment Patient Details Name: [name] MRN: [id number] DOB: [birth year] Today's Date: 10/18/2016    History of Present Illness Patient is a 45 y.o. female admitted on 28 NOV after being found unresponsive in group home bathroom after hitting her head on her dresser in the night. Patient diagnosed with acute septic shock from UTI, severe metabolic acidosis, and acute encephalopathy w/acute respiratory failure. PMH includes schizophrenia.    PT Comments    Pt able to progress ambulation distance to 110 feet with RW CGA; limited distance d/t SOB and HR increased from 98 bpm at rest to 124 bpm with activity (HR returned to lower 90's with rest).  Will continue to progress pt with activity tolerance, strengthening, and progressive functional mobility per pt tolerance.   Follow Up Recommendations  Supervision/Assistance - 24 hour;Supervision for mobility/OOB;Home health PT (Return to group home with close supervision; supervision for OOB mobility)     Equipment Recommendations  Rolling walker with 5" wheels (pt reports having RW at home already)    Recommendations for Other Services       Precautions / Restrictions Precautions Precautions: Fall Precaution Comments: R IJ HD permcath Restrictions Weight Bearing Restrictions: No    Mobility  Bed Mobility Overal bed mobility: Needs Assistance Bed Mobility: Supine to Sit     Supine to sit: Supervision;HOB elevated     General bed mobility comments: inceased effort to perform on pt's own  Transfers Overall transfer level: Needs assistance Equipment used: Rolling walker (2 wheeled) Transfers: Sit to/from Stand Sit to Stand: Min guard Stand pivot transfers: Min guard       General transfer comment: x1 trial from bed; x4 trials from chair; impulsiveness noted with sitting (vc's for safety required)  Ambulation/Gait Ambulation/Gait assistance: Min guard Ambulation Distance (Feet): 110 Feet Assistive  device: Rolling walker (2 wheeled) Gait Pattern/deviations: Wide base of support;Decreased step length - right;Decreased step length - left   Gait velocity interpretation: Below normal speed for age/gender General Gait Details: limited distance d/t SOB   Stairs            Wheelchair Mobility    Modified Rankin (Stroke Patients Only)       Balance Overall balance assessment: Needs assistance Sitting-balance support: Bilateral upper extremity supported;Feet supported Sitting balance-Leahy Scale: Good     Standing balance support: Bilateral upper extremity supported (on RW) Standing balance-Leahy Scale: Fair                      Cognition Arousal/Alertness: Awake/alert Behavior During Therapy: WFL for tasks assessed/performed Overall Cognitive Status: No family/caregiver present to determine baseline cognitive functioning                      Exercises General Exercises - Lower Extremity (B UE support on RW; CGA) Hip ABduction/ADduction: AROM;Strengthening;Both;10 reps;Standing Hip Flexion/Marching: AROM;Strengthening;Both;10 reps;Standing Heel Raises: AROM;Strengthening;Both;10 reps;Standing Other Exercises Other Exercises: standing hip extension and hamstring curls B LE's standing with UE support on RW    General Comments   Nursing cleared pt for participation in physical therapy.  Pt agreeable to PT session.      Pertinent Vitals/Pain Pain Assessment: No/denies pain    Home Living                      Prior Function            PT Goals (current goals can now be found in the care plan  section) Acute Rehab PT Goals Patient Stated Goal: "To get better" PT Goal Formulation: With patient Time For Goal Achievement: 10/22/16 Potential to Achieve Goals: Good Progress towards PT goals: Progressing toward goals    Frequency    Min 2X/week      PT Plan Current plan remains appropriate    Co-evaluation             End of  Session Equipment Utilized During Treatment: Gait belt Activity Tolerance: Patient limited by fatigue Patient left: in chair;with call bell/phone within reach;with chair alarm set     Time: 1010-1033 PT Time Calculation (min) (ACUTE ONLY): 23 min  Charges:  $Therapeutic Exercise: 8-22 mins $Therapeutic Activity: 8-22 mins                    G CodesLeitha Bleak 2016/10/30, 10:43 AM [name], PT [phone]

## 2016-10-18 NOTE — Clinical Social Work Note (Signed)
CSW informed patient's group home had called this morning requesting if patient could return to them with home health rather than go to rehab. CSW contacted the owner: [name] of [place], and she informed CSW that she believes that her staff can manage patient and provide 24/7 close supervision. CSW contacted patient's power of attorney: [name] and informed her of this information and asked her what she wished for [place] to pursue on patient's behalf. [name] stated that she would be fine with patient returning to the group home because they have been so good to her. MD to discharge patient today back to group home with home health and [name] to transport when time. Shela Leff MSW,LCSW [phone]

## 2016-10-18 NOTE — Plan of Care (Signed)
Problem: Education: Goal: Knowledge of [facility] Education information/materials will improve Outcome: Not Progressing Patient needs assistance.  Problem: Skin Integrity: Goal: Risk for impaired skin integrity will decrease Outcome: Progressing Patient has redness due to diarrhea but is using barrier cream on buttocks.

## 2016-10-18 NOTE — Progress Notes (Signed)
Called Dr. Jannifer Franklin regarding an order for patient to shower.  Appropriate orders were placed.  [name]  10/18/2016  12:57 AM

## 2016-10-18 NOTE — Progress Notes (Signed)
[facility]  ROUNDING NOTE   Subjective:  Overall patient feeling much better. PermCath to be removed today. Creatinine down to 2.65 today with a urine output of 1.3 L over the preceding 24 hours.    Objective:  Vital signs in last 24 hours:  Temp:  [98 F (36.7 C)-98.3 F (36.8 C)] 98.3 F (36.8 C) (12/19 1300) Pulse Rate:  [83-96] 90 (12/19 1300) Resp:  [18] 18 (12/19 1300) BP: (105-127)/(62-90) 108/68 (12/19 1300) SpO2:  [97 %-100 %] 99 % (12/19 1300) Weight:  [115.7 kg (255 lb 1.2 oz)] 115.7 kg (255 lb 1.2 oz) (12/19 0020)  Weight change: 0.7 kg (1 lb 8.7 oz) Filed Weights   10/16/16 0500 10/17/16 0507 10/18/16 0020  Weight: 115.8 kg (255 lb 4.7 oz) 115 kg (253 lb 8.5 oz) 115.7 kg (255 lb 1.2 oz)    Intake/Output: I/O last 3 completed shifts: In: 600 [P.O.:600] Out: 3900 [Urine:3900]   Intake/Output this shift:  Total I/O In: 720 [P.O.:720] Out: 1500 [Urine:1500]  Physical Exam: General: No acute distress  Head: OM moist  Eyes: Anicteric  Neck: Supple, trachea midline  Lungs:  clear, normal effort , room air  Heart: S1S2 No rubs   Abdomen:  Soft, non tender, bowel sounds present  Extremities: Trace peripheral edema.  Neurologic: Alert, able to answer questions  Skin: No lesions  Access: R IJ permcath    Basic Metabolic Panel:  Recent Labs Lab 10/12/16 0440 10/13/16 0514 10/14/16 0448 10/15/16 1420 10/17/16 0547 10/18/16 0506  NA 135 133* 139 140 143 141  K 3.6 3.2* 3.2* 3.5 2.9* 3.7  CL 104 102 109 110 109 111  CO2 22 20* 20* 22 28 24   GLUCOSE 92 85 88 136* 97 100*  BUN 61* 63* 58* 31* 20 26*  CREATININE 6.68* 6.64* 6.59* 4.26* 3.14* 2.65*  CALCIUM 7.8* 7.3* 7.5* 7.2* 7.5* 7.8*  MG  --   --  1.9  --   --  1.5*  PHOS 5.3*  --   --  3.6 2.6  --     Liver Function Tests:  Recent Labs Lab 10/12/16 0440 10/15/16 1420 10/17/16 0547  ALBUMIN 2.0* 1.8* 1.9*   No results for input(s): LIPASE, AMYLASE in the last 168 hours. No  results for input(s): AMMONIA in the last 168 hours.  CBC:  Recent Labs Lab 10/12/16 0440 10/13/16 0514 10/14/16 0127 10/14/16 0448 10/14/16 1809  WBC 10.4  --   --  10.1  --   NEUTROABS 4.9  --   --  5.4  --   HGB 7.0* 6.0* 7.5* 7.3* 8.8*  HCT 20.3*  --  21.9* 22.0* 25.7*  MCV 93.0  --   --  90.0  --   PLT 433  --   --  518*  --     Cardiac Enzymes: No results for input(s): CKTOTAL, CKMB, CKMBINDEX, TROPONINI in the last 168 hours.  BNP: Invalid input(s): POCBNP  CBG:  Recent Labs Lab 10/17/16 1135 10/17/16 1644 10/17/16 2132 10/18/16 0736 10/18/16 1129  GLUCAP 110* 134* 125* 98 97    Microbiology: Results for orders placed or performed during the hospital encounter of 09/27/16  Blood Culture (routine x 2)     Status: None   Collection Time: 09/27/16  6:02 AM  Result Value Ref Range Status   Specimen Description BLOOD  R AC  Final   Special Requests   Final    BOTTLES DRAWN AEROBIC AND ANAEROBIC  AER 3  ML ANA 4 ML   Culture NO GROWTH 5 DAYS  Final   Report Status 10/02/2016 FINAL  Final  Blood Culture (routine x 2)     Status: None   Collection Time: 09/27/16  6:03 AM  Result Value Ref Range Status   Specimen Description BLOOD  R ARM  Final   Special Requests   Final    BOTTLES DRAWN AEROBIC AND ANAEROBIC  AER 6 ML ANA 10 ML   Culture NO GROWTH 5 DAYS  Final   Report Status 10/02/2016 FINAL  Final  Urine culture     Status: None   Collection Time: 09/27/16  9:30 AM  Result Value Ref Range Status   Specimen Description URINE, RANDOM  Final   Special Requests NONE  Final   Culture NO GROWTH Performed at [facility]   Final   Report Status 09/28/2016 FINAL  Final  MRSA PCR Screening     Status: None   Collection Time: 09/27/16 11:09 AM  Result Value Ref Range Status   MRSA by PCR NEGATIVE NEGATIVE Final    Comment:        The GeneXpert MRSA Assay (FDA approved for NASAL specimens only), is one component of a comprehensive MRSA  colonization surveillance program. It is not intended to diagnose MRSA infection nor to guide or monitor treatment for MRSA infections.   C difficile quick scan w PCR reflex     Status: None   Collection Time: 10/04/16 12:20 AM  Result Value Ref Range Status   C Diff antigen NEGATIVE NEGATIVE Final   C Diff toxin NEGATIVE NEGATIVE Final   C Diff interpretation No C. difficile detected.  Final  Gastrointestinal Panel by PCR , Stool     Status: None   Collection Time: 10/08/16  7:00 PM  Result Value Ref Range Status   Campylobacter species NOT DETECTED NOT DETECTED Final   Plesimonas shigelloides NOT DETECTED NOT DETECTED Final   Salmonella species NOT DETECTED NOT DETECTED Final   Yersinia enterocolitica NOT DETECTED NOT DETECTED Final   Vibrio species NOT DETECTED NOT DETECTED Final   Vibrio cholerae NOT DETECTED NOT DETECTED Final   Enteroaggregative E coli (EAEC) NOT DETECTED NOT DETECTED Final   Enteropathogenic E coli (EPEC) NOT DETECTED NOT DETECTED Final   Enterotoxigenic E coli (ETEC) NOT DETECTED NOT DETECTED Final   Shiga like toxin producing E coli (STEC) NOT DETECTED NOT DETECTED Final   Shigella/Enteroinvasive E coli (EIEC) NOT DETECTED NOT DETECTED Final   Cryptosporidium NOT DETECTED NOT DETECTED Final   Cyclospora cayetanensis NOT DETECTED NOT DETECTED Final   Entamoeba histolytica NOT DETECTED NOT DETECTED Final   Giardia lamblia NOT DETECTED NOT DETECTED Final   Adenovirus F40/41 NOT DETECTED NOT DETECTED Final   Astrovirus NOT DETECTED NOT DETECTED Final   Norovirus GI/GII NOT DETECTED NOT DETECTED Final   Rotavirus A NOT DETECTED NOT DETECTED Final   Sapovirus (I, II, IV, and V) NOT DETECTED NOT DETECTED Final    Coagulation Studies: No results for input(s): LABPROT, INR in the last 72 hours.  Urinalysis: No results for input(s): COLORURINE, LABSPEC, PHURINE, GLUCOSEU, HGBUR, BILIRUBINUR, KETONESUR, PROTEINUR, UROBILINOGEN, NITRITE, LEUKOCYTESUR in the  last 72 hours.  Invalid input(s): APPERANCEUR    Imaging: No results found.   Medications:    . sodium chloride   Intravenous Once  . sodium chloride   Intravenous Once  . bacitracin   Topical BID  . chlorhexidine  15 mL Mouth Rinse BID  .  cholestyramine light  4 g Oral BID  . citalopram  40 mg Oral Daily  . cloZAPine  200 mg Oral Daily  . famotidine  20 mg Oral Daily  . feeding supplement (ENSURE ENLIVE)  237 mL Oral Q24H  . feeding supplement (PRO-STAT SUGAR FREE 64)  30 mL Oral BID  . haloperidol lactate  5 mg Intravenous Once  . heparin  5,000 Units Subcutaneous Q8H  . levothyroxine  50 mcg Oral QAC breakfast  . magnesium sulfate 1 - 4 g bolus IVPB  2 g Intravenous Once  . mesalamine  500 mg Oral TID  . mesalamine  4 g Rectal QHS  . OLANZapine zydis  10 mg Oral BID WC  . predniSONE  40 mg Oral Q breakfast  . simvastatin  20 mg Oral q1800  . sodium chloride flush  10-40 mL Intracatheter Q12H  . tuberculin  5 Units Intradermal Once   sodium chloride, acetaminophen, LORazepam, ondansetron (ZOFRAN) IV  Assessment/ Plan:  45 y.o.white female with a PMHx of Crohn's disease, depression, diabetes mellitus type 2, GERD, hiatal hernia, hyperlipidemia, hypertension, hypothyroidism, obesity, schizophrenia, traumatic brain injury, vitamin D deficiency, who was [facility] to [facility] on 09/27/2016   1.  Acute renal failure: CRRT from 11/29 to 12/5. Hemodialysis treatment on 12/8 Acute renal failure sepsis, hypotension, and hypoxemia Presenting creatinine 4.09 on September 19, 2016.  Prior to that, the only known creatinine is 1.16 from January 2014 last dialysis treatment Dec 16 - patient continues to improve.  Creatinine down to 2.6.  Good urine output noted. No further indication for dialysis.  Therefore we will request PermCath to be removed today.  She will need followup in our office in the next week or 2.  2.  Hypokalemia - Likely GI losses - potassium up to 3.7 today.  Continue  to monitor as an outpatient  3. Inflammatory bowel disease- Crohn's colitis - followed by Dr [name] and [facility], now on prednisone 40 mg every morning.    LOS: 21 [name] 12/19/20173:09 PM

## 2016-10-18 NOTE — Progress Notes (Signed)
10/18/2016 4:16 PM  BP 108/68 (BP Location: Right Arm)   Pulse 90   Temp 98.3 F (36.8 C) (Oral)   Resp 18   Ht 5\' 6"  (1.676 m)   Wt 115.7 kg (255 lb 1.2 oz)   SpO2 99%   BMI 41.17 kg/m  Patient discharged per MD orders. Discharge instructions reviewed with caregiver and she verbalized understanding. IV removed per policy. Prescriptions discussed and given to  Caregiver in packet. Discharged via wheelchair escorted by nursing staff.  Almedia Balls, RN

## 2016-10-18 NOTE — Care Management Important Message (Signed)
Important Message  Patient Details  Name: [name] MRN: [id number] Date of Birth: 1971/04/02   Medicare Important Message Given:  Yes    Beverly Sessions, RN 10/18/2016, 3:21 PM

## 2016-10-19 LAB — CLOZAPINE (CLOZARIL)
Clozapine Lvl: 1535 ng/mL — ABNORMAL HIGH (ref 350–650)
NorClozapine: 316 ng/mL
Total(Cloz+Norcloz): 1851 ng/mL

## 2016-10-21 ENCOUNTER — Encounter: Payer: Self-pay | Admitting: Vascular Surgery

## 2016-10-21 NOTE — Op Note (Signed)
  OPERATIVE NOTE   PROCEDURE: 1. Removal of a right IJ tunneled dialysis catheter  PRE-OPERATIVE DIAGNOSIS: Complication of dialysis catheter, End stage renal disease  POST-OPERATIVE DIAGNOSIS: Same  SURGEON: Hortencia Pilar, M.D.  ANESTHESIA: Local anesthetic with 1% lidocaine with epinephrine   ESTIMATED BLOOD LOSS: Minimal   FINDING(S): 1. Catheter intact   SPECIMEN(S):  Catheter  INDICATIONS:   [name] is a 45 y.o. female who presents with nonfunctioning tunneled catheter with adequate extremity access.  The patient has undergone placement of an extremity access which is working and this has been successfully cannulated without difficulty.  therefore is undergoing removal of his tunneled catheter which is no longer needed to avoid septic complications.   DESCRIPTION: After obtaining full informed written consent, the patient was positioned supine. The right IJ catheter and surrounding area is prepped and draped in a sterile fashion. The cuff was localized by palpation and noted to be less than 3 cm from the exit site. After appropriate timeout is called, 1% lidocaine with epinephrine is infiltrated into the surrounding tissues around the cuff. Small transverse incision is created at the exit site with an 11 blade scalpel and the dissection was carried up along the catheter to expose the cuff of the tunneled catheter.  The catheter cuff is then freed from the surrounding attachments and adhesions. Once the catheter has been freed circumferentially it is removed in 1 piece. Light pressure was held at the base of the neck.   Antibiotic ointment and a sterile dressing is applied to the exit site. Patient tolerated procedure well and there were no complications.  COMPLICATIONS: None  CONDITION: Unchanged  Hortencia Pilar, M.D. [facility] Office: [phone]  10/21/2016,1:28 PM

## 2017-01-23 ENCOUNTER — Inpatient Hospital Stay
Admission: EM | Admit: 2017-01-23 | Discharge: 2017-01-28 | DRG: 315 | Disposition: A | Discharge status: Skilled Nursing Facility | Payer: Medicare Other | Attending: Internal Medicine | Admitting: Internal Medicine

## 2017-01-23 ENCOUNTER — Emergency Department: Payer: Medicare Other

## 2017-01-23 ENCOUNTER — Encounter: Payer: Self-pay | Admitting: Emergency Medicine

## 2017-01-23 DIAGNOSIS — E559 Vitamin D deficiency, unspecified: Secondary | ICD-10-CM | POA: Diagnosis present

## 2017-01-23 DIAGNOSIS — E039 Hypothyroidism, unspecified: Secondary | ICD-10-CM | POA: Diagnosis present

## 2017-01-23 DIAGNOSIS — I950 Idiopathic hypotension: Secondary | ICD-10-CM | POA: Diagnosis not present

## 2017-01-23 DIAGNOSIS — Z79899 Other long term (current) drug therapy: Secondary | ICD-10-CM

## 2017-01-23 DIAGNOSIS — F209 Schizophrenia, unspecified: Secondary | ICD-10-CM | POA: Diagnosis present

## 2017-01-23 DIAGNOSIS — I959 Hypotension, unspecified: Secondary | ICD-10-CM | POA: Diagnosis not present

## 2017-01-23 DIAGNOSIS — F329 Major depressive disorder, single episode, unspecified: Secondary | ICD-10-CM | POA: Diagnosis present

## 2017-01-23 DIAGNOSIS — N183 Chronic kidney disease, stage 3 (moderate): Secondary | ICD-10-CM | POA: Diagnosis present

## 2017-01-23 DIAGNOSIS — K5090 Crohn's disease, unspecified, without complications: Secondary | ICD-10-CM | POA: Diagnosis present

## 2017-01-23 DIAGNOSIS — R609 Edema, unspecified: Secondary | ICD-10-CM

## 2017-01-23 DIAGNOSIS — L0390 Cellulitis, unspecified: Secondary | ICD-10-CM | POA: Diagnosis present

## 2017-01-23 DIAGNOSIS — E785 Hyperlipidemia, unspecified: Secondary | ICD-10-CM | POA: Diagnosis present

## 2017-01-23 DIAGNOSIS — E669 Obesity, unspecified: Secondary | ICD-10-CM | POA: Diagnosis present

## 2017-01-23 DIAGNOSIS — Z6839 Body mass index (BMI) 39.0-39.9, adult: Secondary | ICD-10-CM

## 2017-01-23 DIAGNOSIS — K219 Gastro-esophageal reflux disease without esophagitis: Secondary | ICD-10-CM | POA: Diagnosis present

## 2017-01-23 DIAGNOSIS — N390 Urinary tract infection, site not specified: Secondary | ICD-10-CM | POA: Diagnosis present

## 2017-01-23 DIAGNOSIS — Z87820 Personal history of traumatic brain injury: Secondary | ICD-10-CM

## 2017-01-23 DIAGNOSIS — I129 Hypertensive chronic kidney disease with stage 1 through stage 4 chronic kidney disease, or unspecified chronic kidney disease: Secondary | ICD-10-CM | POA: Diagnosis present

## 2017-01-23 DIAGNOSIS — R600 Localized edema: Secondary | ICD-10-CM | POA: Diagnosis present

## 2017-01-23 DIAGNOSIS — D638 Anemia in other chronic diseases classified elsewhere: Secondary | ICD-10-CM | POA: Diagnosis present

## 2017-01-23 DIAGNOSIS — E1122 Type 2 diabetes mellitus with diabetic chronic kidney disease: Secondary | ICD-10-CM | POA: Diagnosis present

## 2017-01-23 LAB — DIFFERENTIAL
BAND NEUTROPHILS: 0 %
BASOS ABS: 0 10*3/uL (ref 0–0.1)
Basophils Relative: 0 %
Blasts: 0 %
EOS ABS: 0 10*3/uL (ref 0–0.7)
EOS PCT: 0 %
LYMPHS ABS: 4.9 10*3/uL — AB (ref 1.0–3.6)
Lymphocytes Relative: 53 %
METAMYELOCYTES PCT: 0 %
MONO ABS: 0.5 10*3/uL (ref 0.2–0.9)
Monocytes Relative: 5 %
Myelocytes: 0 %
NEUTROS ABS: 3.8 10*3/uL (ref 1.4–6.5)
NEUTROS PCT: 42 %
NRBC: 0 /100{WBCs}
Other: 0 %
Promyelocytes Absolute: 0 %

## 2017-01-23 LAB — TROPONIN I

## 2017-01-23 LAB — BLOOD GAS, VENOUS
Acid-base deficit: 3.3 mmol/L — ABNORMAL HIGH (ref 0.0–2.0)
Bicarbonate: 22.1 mmol/L (ref 20.0–28.0)
O2 Saturation: 79.2 %
PATIENT TEMPERATURE: 37
PH VEN: 7.35 (ref 7.250–7.430)
pCO2, Ven: 40 mmHg — ABNORMAL LOW (ref 44.0–60.0)
pO2, Ven: 46 mmHg — ABNORMAL HIGH (ref 32.0–45.0)

## 2017-01-23 LAB — URINALYSIS, COMPLETE (UACMP) WITH MICROSCOPIC
Bilirubin Urine: NEGATIVE
GLUCOSE, UA: NEGATIVE mg/dL
Hgb urine dipstick: NEGATIVE
KETONES UR: NEGATIVE mg/dL
LEUKOCYTES UA: NEGATIVE
NITRITE: NEGATIVE
PROTEIN: NEGATIVE mg/dL
SPECIFIC GRAVITY, URINE: 1.002 — AB (ref 1.005–1.030)
pH: 5 (ref 5.0–8.0)

## 2017-01-23 LAB — BASIC METABOLIC PANEL
Anion gap: 6 (ref 5–15)
BUN: 9 mg/dL (ref 6–20)
CO2: 22 mmol/L (ref 22–32)
Calcium: 8.3 mg/dL — ABNORMAL LOW (ref 8.9–10.3)
Chloride: 109 mmol/L (ref 101–111)
Creatinine, Ser: 1.34 mg/dL — ABNORMAL HIGH (ref 0.44–1.00)
GFR, EST AFRICAN AMERICAN: 54 mL/min — AB (ref >60)
GFR, EST NON AFRICAN AMERICAN: 47 mL/min — AB (ref >60)
Glucose, Bld: 70 mg/dL (ref 65–99)
POTASSIUM: 3.8 mmol/L (ref 3.5–5.1)
SODIUM: 137 mmol/L (ref 135–145)

## 2017-01-23 LAB — CBC
HEMATOCRIT: 27.3 % — AB (ref 35.0–47.0)
Hemoglobin: 8.7 g/dL — ABNORMAL LOW (ref 12.0–16.0)
MCH: 30.8 pg (ref 26.0–34.0)
MCHC: 31.9 g/dL — ABNORMAL LOW (ref 32.0–36.0)
MCV: 96.4 fL (ref 80.0–100.0)
Platelets: 481 10*3/uL — ABNORMAL HIGH (ref 150–440)
RBC: 2.83 MIL/uL — AB (ref 3.80–5.20)
RDW: 21 % — AB (ref 11.5–14.5)
WBC: 9.2 10*3/uL (ref 3.6–11.0)

## 2017-01-23 LAB — CREATININE, SERUM
CREATININE: 1.18 mg/dL — AB (ref 0.44–1.00)
GFR calc Af Amer: >60 mL/min (ref >60)
GFR, EST NON AFRICAN AMERICAN: 55 mL/min — AB (ref >60)

## 2017-01-23 LAB — LACTIC ACID, PLASMA
LACTIC ACID, VENOUS: 1.3 mmol/L (ref 0.5–1.9)
Lactic Acid, Venous: 1.7 mmol/L (ref 0.5–1.9)

## 2017-01-23 LAB — GLUCOSE, CAPILLARY: GLUCOSE-CAPILLARY: 86 mg/dL (ref 65–99)

## 2017-01-23 MED ORDER — POTASSIUM CHLORIDE ER 10 MEQ PO TBCR
20.0000 meq | EXTENDED_RELEASE_TABLET | Freq: Two times a day (BID) | ORAL | Status: DC
  Administered 2017-01-23 – 2017-01-28 (×10): 20 meq via ORAL
  Filled 2017-01-23 (×19): qty 2

## 2017-01-23 MED ORDER — VITAMIN D 1000 UNITS PO TABS
5000.0000 [IU] | ORAL_TABLET | Freq: Every day | ORAL | Status: DC
  Administered 2017-01-23 – 2017-01-28 (×6): 5000 [IU] via ORAL
  Filled 2017-01-23 (×6): qty 5

## 2017-01-23 MED ORDER — PSEUDOEPHEDRINE HCL ER 120 MG PO TB12
120.0000 mg | ORAL_TABLET | Freq: Two times a day (BID) | ORAL | Status: DC | PRN
  Filled 2017-01-23: qty 1

## 2017-01-23 MED ORDER — CLOZAPINE 100 MG PO TABS
200.0000 mg | ORAL_TABLET | Freq: Every day | ORAL | Status: DC
  Administered 2017-01-23 – 2017-01-24 (×2): 200 mg via ORAL
  Filled 2017-01-23 (×2): qty 2

## 2017-01-23 MED ORDER — CALCIUM CARBONATE ANTACID 500 MG PO CHEW
600.0000 mg | CHEWABLE_TABLET | Freq: Two times a day (BID) | ORAL | Status: DC
  Administered 2017-01-23 – 2017-01-28 (×8): 600 mg via ORAL
  Filled 2017-01-23 (×7): qty 3

## 2017-01-23 MED ORDER — PIPERACILLIN-TAZOBACTAM 3.375 G IVPB 30 MIN
3.3750 g | Freq: Once | INTRAVENOUS | Status: AC
  Administered 2017-01-23: 3.375 g via INTRAVENOUS
  Filled 2017-01-23: qty 50

## 2017-01-23 MED ORDER — MESALAMINE ER 0.375 G PO CP24
1.5000 g | ORAL_CAPSULE | Freq: Every day | ORAL | Status: DC | PRN
  Filled 2017-01-23: qty 4

## 2017-01-23 MED ORDER — PSEUDOEPHEDRINE-GUAIFENESIN ER 60-600 MG PO TB12: 2.0000 | ORAL_TABLET | Freq: Two times a day (BID) | ORAL | Status: DC | PRN

## 2017-01-23 MED ORDER — ACETAMINOPHEN 325 MG PO TABS: 650.0000 mg | ORAL_TABLET | Freq: Four times a day (QID) | ORAL | Status: DC | PRN

## 2017-01-23 MED ORDER — ACETAMINOPHEN 650 MG RE SUPP: 650.0000 mg | Freq: Four times a day (QID) | RECTAL | Status: DC | PRN

## 2017-01-23 MED ORDER — SENNOSIDES-DOCUSATE SODIUM 8.6-50 MG PO TABS: 1.0000 | ORAL_TABLET | Freq: Every evening | ORAL | Status: DC | PRN

## 2017-01-23 MED ORDER — GEMFIBROZIL 600 MG PO TABS
600.0000 mg | ORAL_TABLET | Freq: Two times a day (BID) | ORAL | Status: DC
  Administered 2017-01-23 – 2017-01-28 (×10): 600 mg via ORAL
  Filled 2017-01-23 (×11): qty 1

## 2017-01-23 MED ORDER — VITAMIN B-12 1000 MCG PO TABS
1000.0000 ug | ORAL_TABLET | Freq: Every day | ORAL | Status: DC
  Administered 2017-01-24 – 2017-01-28 (×5): 1000 ug via ORAL
  Filled 2017-01-23 (×5): qty 1

## 2017-01-23 MED ORDER — CITALOPRAM HYDROBROMIDE 20 MG PO TABS
40.0000 mg | ORAL_TABLET | Freq: Every day | ORAL | Status: DC
  Administered 2017-01-24 – 2017-01-28 (×5): 40 mg via ORAL
  Filled 2017-01-23 (×5): qty 2

## 2017-01-23 MED ORDER — BISACODYL 5 MG PO TBEC: 5.0000 mg | DELAYED_RELEASE_TABLET | Freq: Every day | ORAL | Status: DC | PRN

## 2017-01-23 MED ORDER — SODIUM CHLORIDE 0.9 % IV BOLUS (SEPSIS)
1000.0000 mL | Freq: Once | INTRAVENOUS | Status: AC
  Administered 2017-01-23: 1000 mL via INTRAVENOUS

## 2017-01-23 MED ORDER — PANTOPRAZOLE SODIUM 40 MG PO TBEC
40.0000 mg | DELAYED_RELEASE_TABLET | Freq: Every day | ORAL | Status: DC
  Administered 2017-01-24 – 2017-01-28 (×5): 40 mg via ORAL
  Filled 2017-01-23 (×5): qty 1

## 2017-01-23 MED ORDER — HEPARIN SODIUM (PORCINE) 5000 UNIT/ML IJ SOLN
5000.0000 [IU] | Freq: Three times a day (TID) | INTRAMUSCULAR | Status: DC
  Administered 2017-01-23 – 2017-01-28 (×14): 5000 [IU] via SUBCUTANEOUS
  Filled 2017-01-23 (×14): qty 1

## 2017-01-23 MED ORDER — ONDANSETRON HCL 4 MG PO TABS: 4.0000 mg | ORAL_TABLET | Freq: Four times a day (QID) | ORAL | Status: DC | PRN

## 2017-01-23 MED ORDER — LEVOTHYROXINE SODIUM 25 MCG PO TABS
25.0000 ug | ORAL_TABLET | Freq: Every day | ORAL | Status: DC
  Administered 2017-01-24 – 2017-01-28 (×5): 25 ug via ORAL
  Filled 2017-01-23 (×5): qty 1

## 2017-01-23 MED ORDER — DIPHENHYDRAMINE HCL 25 MG PO CAPS
50.0000 mg | ORAL_CAPSULE | Freq: Every evening | ORAL | Status: DC | PRN
Start: 2017-01-23 — End: 2017-01-28

## 2017-01-23 MED ORDER — MESALAMINE 4 G RE ENEM
4.0000 g | ENEMA | Freq: Every evening | RECTAL | Status: DC | PRN
  Filled 2017-01-23: qty 60

## 2017-01-23 MED ORDER — SODIUM CHLORIDE 0.9 % IV BOLUS (SEPSIS)
500.0000 mL | Freq: Once | INTRAVENOUS | Status: AC
Start: 2017-01-23 — End: 2017-01-23
  Administered 2017-01-23: 500 mL via INTRAVENOUS

## 2017-01-23 MED ORDER — DOCUSATE SODIUM 100 MG PO CAPS
100.0000 mg | ORAL_CAPSULE | Freq: Two times a day (BID) | ORAL | Status: DC
  Administered 2017-01-23 – 2017-01-28 (×10): 100 mg via ORAL
  Filled 2017-01-23 (×10): qty 1

## 2017-01-23 MED ORDER — GUAIFENESIN ER 600 MG PO TB12: 1200.0000 mg | ORAL_TABLET | Freq: Two times a day (BID) | ORAL | Status: DC | PRN

## 2017-01-23 MED ORDER — RISPERIDONE 0.5 MG PO TBDP
1.0000 mg | ORAL_TABLET | Freq: Two times a day (BID) | ORAL | Status: DC
  Administered 2017-01-23 – 2017-01-28 (×10): 1 mg via ORAL
  Filled 2017-01-23 (×12): qty 2

## 2017-01-23 MED ORDER — SODIUM CHLORIDE 0.9 % IV SOLN
INTRAVENOUS | Status: DC
  Administered 2017-01-23 – 2017-01-27 (×6): via INTRAVENOUS
  Administered 2017-01-28: 75 mL/h via INTRAVENOUS

## 2017-01-23 MED ORDER — ONDANSETRON HCL 4 MG/2ML IJ SOLN
4.0000 mg | Freq: Four times a day (QID) | INTRAMUSCULAR | Status: DC | PRN
  Administered 2017-01-27 – 2017-01-28 (×2): 4 mg via INTRAVENOUS
  Filled 2017-01-23 (×2): qty 2

## 2017-01-23 MED ORDER — HYDROCORTISONE NA SUCCINATE PF 100 MG IJ SOLR
100.0000 mg | Freq: Once | INTRAMUSCULAR | Status: AC
  Administered 2017-01-23: 100 mg via INTRAVENOUS
  Filled 2017-01-23: qty 2

## 2017-01-23 MED ORDER — ALBUTEROL SULFATE (2.5 MG/3ML) 0.083% IN NEBU: 2.5000 mg | INHALATION_SOLUTION | RESPIRATORY_TRACT | Status: DC | PRN

## 2017-01-23 MED ORDER — LORAZEPAM 0.5 MG PO TABS: 0.5000 mg | ORAL_TABLET | ORAL | Status: DC | PRN

## 2017-01-23 MED ORDER — VANCOMYCIN HCL IN DEXTROSE 1-5 GM/200ML-% IV SOLN
1000.0000 mg | Freq: Once | INTRAVENOUS | Status: AC
  Administered 2017-01-23: 1000 mg via INTRAVENOUS
  Filled 2017-01-23: qty 200

## 2017-01-23 MED ORDER — LORAZEPAM 1 MG PO TABS
1.0000 mg | ORAL_TABLET | ORAL | Status: DC
  Administered 2017-01-24 – 2017-01-28 (×6): 1 mg via ORAL
  Filled 2017-01-23 (×6): qty 1

## 2017-01-23 MED ORDER — SIMVASTATIN 20 MG PO TABS
20.0000 mg | ORAL_TABLET | Freq: Every day | ORAL | Status: DC
  Administered 2017-01-23 – 2017-01-27 (×5): 20 mg via ORAL
  Filled 2017-01-23 (×6): qty 1

## 2017-01-23 MED ORDER — CHOLESTYRAMINE LIGHT 4 G PO PACK
4.0000 g | PACK | ORAL | Status: DC
  Administered 2017-01-23 – 2017-01-27 (×9): 4 g via ORAL
  Filled 2017-01-23 (×10): qty 1

## 2017-01-23 NOTE — H&P (Addendum)
[facility] [place] NAME: [name]    MR#:  [id number]  DATE OF BIRTH:  03/30/1971  DATE OF ADMISSION:  01/23/2017  PRIMARY CARE PHYSICIAN: Lorelee Market, MD   REQUESTING/REFERRING PHYSICIAN: Earleen Newport, MD  CHIEF COMPLAINT:   Chief Complaint  Patient presents with  . Weakness   Generalized weakness. HISTORY OF PRESENT ILLNESS:  [name]  is a 46 y.o. female with a known history of Hypertension, hyperlipidemia, diabetes, Crohn's disease and hypothyroidism. The patient was sent from group home to ED due to generalized weakness. She also complains of leg edema but no chest pain or shortness of breath. She denies any fever or chills, no nausea, vomiting or diarrhea. She was found hypotensive with systolic blood pressure at 70s to 80s, given normal saline bolus but blood pressure is still in low side. Chest x-ray and urinalysis is unremarkable. Dr. Jimmye Norman request for observation.  PAST MEDICAL HISTORY:   Past Medical History:  Diagnosis Date  . Abnormal mammogram, unspecified 2013   Prev. cytology,hypercellular smears without evidence of malignant cells. The cytopathologist questioned if samples truly representative. Care taken during sampling and is felt to be representative.  . Breast screening, unspecified 2013  . Broken leg    age 61  . Cellulitis   . Crohn's disease ([place]) 2013  . Depression   . Diabetes mellitus without complication (HCC)    non insulin dependent  . Early menopause   . Edema   . GERD (gastroesophageal reflux disease)   . Hiatal hernia 2013  . Hyperlipidemia   . Hypertension   . Hypothyroidism   . 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 Obesity, unspecified 2013  . Other sign and symptom in breast 2013   Right bst US,lower outer quadrant,A single 0.3x0.4x0.6cm hypoechoic mass with slightly lobulated borders with adjacent 0.3x0.4x0.5cm mass was noted. The 1st  was 5cm from nipple, 2nd at 8 cm from the nipple. The previous lesion aspirate was at 3 0'clock position. These lesions are thought to account for the mammographic abnormality. Minimal interval change on [place].   [facility])   . Rib fracture    age 38  . Schizophrenia ([place])   . TBI (traumatic brain injury) ([place])   . Thyroid disease    hypothyroid  . Vitamin D deficiency     PAST SURGICAL HISTORY:   Past Surgical History:  Procedure Laterality Date  . COLONOSCOPY WITH PROPOFOL N/A 02/16/2016   Procedure: COLONOSCOPY WITH PROPOFOL;  Surgeon: Lollie Sails, MD;  Location: [facility] ENDOSCOPY;  Service: Endoscopy;  Laterality: N/A;  . COLONOSCOPY WITH PROPOFOL N/A 02/22/2016   Procedure: COLONOSCOPY WITH PROPOFOL;  Surgeon: Lollie Sails, MD;  Location: [facility] ENDOSCOPY;  Service: Endoscopy;  Laterality: N/A;  . COLONOSCOPY WITH PROPOFOL N/A 10/14/2016   Procedure: COLONOSCOPY WITH PROPOFOL;  Surgeon: Jonathon Bellows, MD;  Location: [facility] ENDOSCOPY;  Service: Endoscopy;  Laterality: N/A;  . EYE SURGERY Left 2013   cataract surgery  . PERIPHERAL VASCULAR CATHETERIZATION N/A 10/13/2016   Procedure: Dialysis/Perma Catheter Insertion;  Surgeon: Algernon Huxley, MD;  Location: [facility] CV LAB;  Service: Cardiovascular;  Laterality: N/A;  . PERIPHERAL VASCULAR CATHETERIZATION N/A 10/18/2016   Procedure: Dialysis/Perma Catheter Removal;  Surgeon: Katha Cabal, MD;  Location: [facility] CV LAB;  Service: Cardiovascular;  Laterality: N/A;  . TUMOR EXCISION Right    age of 89, tumor removed  from [place]:   Social History  Substance Use Topics  . Smoking status: Never Smoker  . Smokeless tobacco: Never Used  . Alcohol use No    FAMILY HISTORY:   Family History  Problem Relation Age of Onset  . Cancer Mother 49    colon  . Cancer Paternal Aunt     DRUG ALLERGIES:   Allergies  Allergen Reactions  . Peanut Oil Other (See Comments)    Face turns red  .  Risperidone And Related Cough    REVIEW OF SYSTEMS:   Review of Systems  Constitutional: Positive for malaise/fatigue. Negative for chills and fever.  HENT: Negative for congestion and sore throat.   Eyes: Negative for blurred vision and double vision.  Respiratory: Negative for cough, shortness of breath, wheezing and stridor.   Cardiovascular: Negative for chest pain, palpitations and leg swelling.  Gastrointestinal: Negative for abdominal pain, blood in stool, constipation, diarrhea, melena, nausea and vomiting.  Genitourinary: Negative for dysuria, flank pain, frequency and hematuria.  Musculoskeletal: Negative for back pain.  Skin: Negative for itching and rash.  Neurological: Positive for dizziness and weakness. Negative for focal weakness and headaches.  Psychiatric/Behavioral: Negative for depression. The patient is not nervous/anxious.     MEDICATIONS AT HOME:   Prior to Admission medications   Medication Sig Start Date End Date Taking? Authorizing Provider  acetaminophen (TYLENOL) 325 MG tablet Take 650 mg by mouth every 6 (six) hours as needed for mild pain. Every 4 to 6 hours PRN   Yes Historical Provider, MD  albuterol (PROVENTIL HFA;VENTOLIN HFA) 108 (90 Base) MCG/ACT inhaler Inhale 1 puff into the lungs 4 (four) times daily as needed for wheezing or shortness of breath.   Yes Historical Provider, MD  calcium carbonate (OSCAL) 1500 (600 Ca) MG TABS tablet Take 600 mg of elemental calcium by mouth 2 (two) times daily with a meal.   Yes Historical Provider, MD  Cholecalciferol 5000 units TABS Take 5,000 Units by mouth daily.   Yes Historical Provider, MD  cholestyramine light (PREVALITE) 4 g packet Take 1 packet (4 g total) by mouth 2 (two) times daily. 10/18/16 01/23/17 Yes Henreitta Leber, MD  citalopram (CELEXA) 40 MG tablet Take 40 mg by mouth daily.   Yes Historical Provider, MD  cloZAPine (CLOZARIL) 100 MG tablet Take 200 mg by mouth at bedtime.    Yes Historical  Provider, MD  diphenhydrAMINE (BENADRYL) 50 MG capsule Take 50 mg by mouth at bedtime.   Yes Historical Provider, MD  docusate sodium (COLACE) 100 MG capsule Take 100 mg by mouth 2 (two) times daily.   Yes Historical Provider, MD  gemfibrozil (LOPID) 600 MG tablet Take 600 mg by mouth 2 (two) times daily.   Yes Historical Provider, MD  hyoscyamine (ANASPAZ) 0.125 MG TBDP disintergrating tablet Place 0.125 mg under the tongue every 6 (six) hours as needed.   Yes Historical Provider, MD  levothyroxine (SYNTHROID, LEVOTHROID) 25 MCG tablet Take 25 mcg by mouth every morning.   Yes Historical Provider, MD  LORazepam (ATIVAN) 0.5 MG tablet Take 0.5 mg by mouth every 4 (four) hours as needed for anxiety.   Yes Historical Provider, MD  LORazepam (ATIVAN) 1 MG tablet Take 1 mg by mouth every morning.   Yes Historical Provider, MD  mesalamine (APRISO) 0.375 g 24 hr capsule Take 1,500 mg by mouth every morning.   Yes Historical Provider, MD  mesalamine (ROWASA) 4 g enema Place 60 mLs (  4 g total) rectally at bedtime. 10/18/16 01/23/17 Yes Henreitta Leber, MD  omega-3 acid ethyl esters (LOVAZA) 1 g capsule Take 2 g by mouth every morning.    Yes Historical Provider, MD  ondansetron (ZOFRAN) 4 MG tablet Take 4 mg by mouth every 6 (six) hours as needed for nausea or vomiting.   Yes Historical Provider, MD  pantoprazole (PROTONIX) 40 MG tablet Take 40 mg by mouth daily.    Yes Historical Provider, MD  potassium chloride (K-DUR) 10 MEQ tablet Take 20 mEq by mouth 2 (two) times daily.   Yes Historical Provider, MD  pseudoephedrine-guaifenesin (MUCINEX D) 60-600 MG 12 hr tablet Take 2 tablets by mouth every 12 (twelve) hours as needed for congestion.    Yes Historical Provider, MD  risperiDONE (RISPERDAL M-TAB) 1 MG disintegrating tablet Take 1 mg by mouth 2 (two) times daily.   Yes Historical Provider, MD  simvastatin (ZOCOR) 20 MG tablet Take 20 mg by mouth at bedtime.   Yes Historical Provider, MD  vitamin B-12  (CYANOCOBALAMIN) 1000 MCG tablet Take 1,000 mcg by mouth daily.   Yes Historical Provider, MD  predniSONE (DELTASONE) 10 MG tablet 4 tabs Daily X 4 days, then 3.5 tabs daily X 7 days, then 3 tabs daily X 7 days, then 2.5 tabs daily X 7 days, then 2 tabs daily X 7 days, then 1.5 tabs daily X 7 days, then 1 tab daily X 7 days then STOP. 10/18/16   Henreitta Leber, MD      VITAL SIGNS:  Blood pressure 102/90, pulse 75, temperature 97.6 F (36.4 C), temperature source Oral, resp. rate 14, weight 250 lb (113.4 kg), SpO2 100 %.  PHYSICAL EXAMINATION:  Physical Exam  GENERAL:  46 y.o.-year-old patient lying in the bed with no acute distress. Obese. EYES: Pupils equal, round, reactive to light and accommodation. No scleral icterus. Extraocular muscles intact.  HEENT: Head atraumatic, normocephalic. Oropharynx and nasopharynx clear.  NECK:  Supple, no jugular venous distention. No thyroid enlargement, no tenderness.  LUNGS: Normal breath sounds bilaterally, no wheezing, rales,rhonchi or crepitation. No use of accessory muscles of respiration.  CARDIOVASCULAR: S1, S2 normal. No murmurs, rubs, or gallops.  ABDOMEN: Soft, nontender, nondistended. Bowel sounds present. No organomegaly or mass.  EXTREMITIES: Bilateral leg edema, weeping on the right leg, no cyanosis, or clubbing.  NEUROLOGIC: Cranial nerves II through XII are intact. Muscle strength 5/5 in all extremities. Sensation intact. Gait not checked.  PSYCHIATRIC: The patient is alert and oriented x 3.  SKIN: No obvious rash, lesion, or ulcer.   LABORATORY PANEL:   CBC  Recent Labs Lab 01/23/17 1302  WBC 9.2  HGB 8.7*  HCT 27.3*  PLT 481*   ------------------------------------------------------------------------------------------------------------------  Chemistries   Recent Labs Lab 01/23/17 1302  NA 137  K 3.8  CL 109  CO2 22  GLUCOSE 70  BUN 9  CREATININE 1.34*  CALCIUM 8.3*    ------------------------------------------------------------------------------------------------------------------  Cardiac Enzymes  Recent Labs Lab 01/23/17 1302  TROPONINI <0.03   ------------------------------------------------------------------------------------------------------------------  RADIOLOGY:  Dg Chest Port 1 View  Result Date: 01/23/2017 CLINICAL DATA:  Weakness. EXAM: PORTABLE CHEST 1 VIEW COMPARISON:  10/02/2016 . FINDINGS: Interim removal of IJ lines. Mediastinum and hilar structures normal. Heart size normal. No focal infiltrate. No pleural effusion or pneumothorax. No acute bony abnormality . IMPRESSION: No acute cardiopulmonary disease. Electronically Signed   By: Marcello Moores  Register   On: 01/23/2017 14:13      IMPRESSION AND PLAN:  Hypotension. Unclear etiology. The patient will be placed for observation. Continue normal saline IV. Follow-up vital sign. Leg edema. The patient may need Lasix if blood pressure is stable. Echocardiogram last November showed Normal LVEF 60% with graade 1 diastolic dysfunction. CKD stage III. Stable. Hyperlipidemia. Continue Lopid. Hypothyroidism. Continue Synthroid and check TSH. Diabetes. Start sliding scale. Generalized weakness. PT evaluation. All the records are reviewed and case discussed with ED provider. Management plans discussed with the patient, family and they are in agreement.  CODE STATUS: Full code  TOTAL TIME TAKING CARE OF THIS PATIENT: 52 minutes.    Demetrios Loll M.D on 01/23/2017 at 4:55 PM  Between 7am to 6pm - Pager - [phone]  After 6pm go to [email or web address] - Proofreader  [facility]  Office  [phone]  CC: Primary care physician; Lorelee Market, MD   Note: This dictation was prepared with Dragon dictation along with smaller phrase technology. Any transcriptional errors that result from this process are unintentional.

## 2017-01-23 NOTE — Progress Notes (Signed)
CLOZAPINE CONSULT - INITIAL   Pharmacy Consult for clozapine REMS monitoring for eligibility  Allergies  Allergen Reactions  . Peanut Oil Other (See Comments)    Face turns red  . Risperidone And Related Cough    Patient Measurements: Height: 5\' 6"  (167.6 cm) Weight: 243 lb (110.2 kg) IBW/kg (Calculated) : 59.3  Vital Signs: Temp: 98.2 F (36.8 C) (03/26 1727) Temp Source: Oral (03/26 1727) BP: 99/57 (03/26 1727) Pulse Rate: 78 (03/26 1727) Intake/Output from previous day: No intake/output data recorded. Intake/Output from this shift: No intake/output data recorded.  Labs:  Recent Labs  01/23/17 1302 01/23/17 1736  WBC 9.2  --   HGB 8.7*  --   HCT 27.3*  --   PLT 481*  --   CREATININE 1.34* 1.18*   Estimated Creatinine Clearance: 75.8 mL/min (A) (by C-G formula based on SCr of 1.18 mg/dL (H)).   Medical History: Past Medical History:  Diagnosis Date  . Abnormal mammogram, unspecified 2013   Prev. cytology,hypercellular smears without evidence of malignant cells. The cytopathologist questioned if samples truly representative. Care taken during sampling and is felt to be representative.  . Breast screening, unspecified 2013  . Broken leg    age 41  . Cellulitis   . Crohn's disease ([place]) 2013  . Depression   . Diabetes mellitus without complication (HCC)    non insulin dependent  . Early menopause   . Edema   . GERD (gastroesophageal reflux disease)   . Hiatal hernia 2013  . Hyperlipidemia   . Hypertension   . Hypothyroidism   . 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 Obesity, unspecified 2013  . Other sign and symptom in breast 2013   Right bst US,lower outer quadrant,A single 0.3x0.4x0.6cm hypoechoic mass with slightly lobulated borders with adjacent 0.3x0.4x0.5cm mass was noted. The 1st was 5cm from nipple, 2nd at 8 cm from the nipple. The previous lesion aspirate was at 3 0'clock position. These lesions are thought to account  for the mammographic abnormality. Minimal interval change on [place].   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 Rib fracture    age 5  . Schizophrenia ([place])   . TBI (traumatic brain injury) ([place])   . Thyroid disease    hypothyroid  . Vitamin D deficiency    Assessment: Pharmacy consult entered to assist with monitoring and submitting ANC to clozapine REMS registry to eligibility to receive medication. Patient was taking clozapine 200 mg PO qHS PTA.  Patients zip code: [id number] [place] on 3/26  Plan:  Patient lab value submitted to clozapine REMS registry - patient is eligible to continue to receive clozapine.   Will recheck CBC with differential in one week.  Lenis Noon, PharmD, BCPS Clinical Pharmacist 01/23/2017,7:27 PM

## 2017-01-23 NOTE — ED Provider Notes (Signed)
[facility] Emergency Department Provider Note       Time seen: ----------------------------------------- 12:59 PM on 01/23/2017 -----------------------------------------     I have reviewed the triage vital signs and the nursing notes.   HISTORY   Chief Complaint Weakness    HPI [name] is a 46 y.o. female who presents to the ED for weakness and low blood pressure this morning. Caregiver reports she is currently on antibiotics for cellulitis and UTI. Patient arrives alert but hypotensive with a blood pressure in the 70s. She denies any specific complaint, caregiver states she's only been sick for the past several days. She was seen by primary care and was referred to the ER for evaluation. There was concerned for possible sepsis.   Past Medical History:  Diagnosis Date  . Abnormal mammogram, unspecified 2013   Prev. cytology,hypercellular smears without evidence of malignant cells. The cytopathologist questioned if samples truly representative. Care taken during sampling and is felt to be representative.  . Breast screening, unspecified 2013  . Broken leg    age 6  . Cellulitis   . Crohn's disease ([place]) 2013  . Depression   . Diabetes mellitus without complication (HCC)    non insulin dependent  . Early menopause   . Edema   . GERD (gastroesophageal reflux disease)   . Hiatal hernia 2013  . Hyperlipidemia   . Hypertension   . Hypothyroidism   . 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 Obesity, unspecified 2013  . Other sign and symptom in breast 2013   Right bst US,lower outer quadrant,A single 0.3x0.4x0.6cm hypoechoic mass with slightly lobulated borders with adjacent 0.3x0.4x0.5cm mass was noted. The 1st was 5cm from nipple, 2nd at 8 cm from the nipple. The previous lesion aspirate was at 3 0'clock position. These lesions are thought to account for the mammographic abnormality. Minimal interval change on [place].   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 Rib fracture    age 69  . Schizophrenia ([place])   . TBI (traumatic brain injury) ([place])   . Thyroid disease    hypothyroid  . Vitamin D deficiency     Patient Active Problem List   Diagnosis Date Noted  . Scalp laceration   . Pressure injury of skin 10/02/2016  . 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 Acute renal failure (ARF) ([place])   . Respiratory failure ([place]) 09/27/2016  . Other sign and symptom in breast     Past Surgical History:  Procedure Laterality Date  . COLONOSCOPY WITH PROPOFOL N/A 02/16/2016   Procedure: COLONOSCOPY WITH PROPOFOL;  Surgeon: Lollie Sails, MD;  Location: [facility] ENDOSCOPY;  Service: Endoscopy;  Laterality: N/A;  . COLONOSCOPY WITH PROPOFOL N/A 02/22/2016   Procedure: COLONOSCOPY WITH PROPOFOL;  Surgeon: Lollie Sails, MD;  Location: [facility] ENDOSCOPY;  Service: Endoscopy;  Laterality: N/A;  . COLONOSCOPY WITH PROPOFOL N/A 10/14/2016   Procedure: COLONOSCOPY WITH PROPOFOL;  Surgeon: Jonathon Bellows, MD;  Location: [facility] ENDOSCOPY;  Service: Endoscopy;  Laterality: N/A;  . EYE SURGERY Left 2013   cataract surgery  . PERIPHERAL VASCULAR CATHETERIZATION N/A 10/13/2016   Procedure: Dialysis/Perma Catheter Insertion;  Surgeon: Algernon Huxley, MD;  Location: [facility] CV LAB;  Service: Cardiovascular;  Laterality: N/A;  . PERIPHERAL VASCULAR CATHETERIZATION N/A 10/18/2016   Procedure: Dialysis/Perma Catheter Removal;  Surgeon: Katha Cabal, MD;  Location: [place] CV LAB;  Service: Cardiovascular;  Laterality: N/A;  . TUMOR EXCISION  Right    age of 12, tumor removed from RUE    Allergies Peanut oil and Risperidone and related  Social History Social History  Substance Use Topics  . Smoking status: Never Smoker  . Smokeless tobacco: Never Used  . Alcohol use No    Review of Systems Constitutional: Negative for fever. Cardiovascular: Negative for chest pain. Respiratory: Negative for shortness of  breath. Gastrointestinal: Negative for abdominal pain, vomiting and diarrhea. Genitourinary: Negative for dysuria. Musculoskeletal: Negative for back pain. Skin: positive for rash Neurological: Negative for headaches, Positive for generalized weakness  10-point ROS otherwise negative.  ____________________________________________   PHYSICAL EXAM:  VITAL SIGNS: ED Triage Vitals  Enc Vitals Group     BP 01/23/17 1220 (!) 85/53     Pulse Rate 01/23/17 1220 85     Resp 01/23/17 1220 18     Temp 01/23/17 1220 97.6 F (36.4 C)     Temp Source 01/23/17 1220 Oral     SpO2 01/23/17 1220 100 %     Weight 01/23/17 1221 250 lb (113.4 kg)     Height --      Head Circumference --      Peak Flow --      Pain Score 01/23/17 1220 0     Pain Loc --      Pain Edu? --      Excl. in [place]? --     Constitutional: Mildly lethargic but oriented, no distress Eyes: Conjunctivae are normal. Normal extraocular movements. ENT   Head: Normocephalic and atraumatic.   Nose: No congestion/rhinnorhea.   Mouth/Throat: Mucous membranes are moist.   Neck: No stridor. Cardiovascular: Normal rate, regular rhythm. No murmurs, rubs, or gallops. Respiratory: Normal respiratory effort without tachypnea nor retractions. Breath sounds are clear and equal bilaterally. No wheezes/rales/rhonchi. Gastrointestinal: Soft and nontender. Normal bowel sounds Musculoskeletal: Nontender with normal range of motion in extremities. Bilateral lower extremity edema Neurologic:  Normal speech and language. No gross focal neurologic deficits are appreciated.  Skin:  Mild erythema over the right lower leg laterally with a small area of skin abrasion and contusion Psychiatric: Mood and affect are normal. Speech and behavior are normal.  ____________________________________________  EKG: Interpreted by me. Sinus rhythm rate 79 bpm, prolonged PR interval, normal QT, normal axis,  artifact  ____________________________________________  ED COURSE:  Pertinent labs & imaging results that were available during my care of the patient were reviewed by me and considered in my medical decision making (see chart for details). Patient presents for weakness and hypotension, we will assess with labs and imaging as indicated. We will initiate sepsis protocol  Left EJ peripheral IV placed by me.   Procedures ____________________________________________   LABS (pertinent positives/negatives)  Labs Reviewed  BASIC METABOLIC PANEL - Abnormal; Notable for the following:       Result Value   Creatinine, Ser 1.34 (*)    Calcium 8.3 (*)    GFR calc non Af Amer 47 (*)    GFR calc Af Amer 54 (*)    All other components within normal limits  CBC - Abnormal; Notable for the following:    RBC 2.83 (*)    Hemoglobin 8.7 (*)    HCT 27.3 (*)    MCHC 31.9 (*)    RDW 21.0 (*)    Platelets 481 (*)    All other components within normal limits  BLOOD GAS, VENOUS - Abnormal; Notable for the following:    pCO2, Ven 40 (*)  pO2, Ven 46.0 (*)    Acid-base deficit 3.3 (*)    All other components within normal limits  CULTURE, BLOOD (ROUTINE X 2)  CULTURE, BLOOD (ROUTINE X 2)  URINE CULTURE  LACTIC ACID, PLASMA  TROPONIN I  URINALYSIS, COMPLETE (UACMP) WITH MICROSCOPIC  LACTIC ACID, PLASMA  CBG MONITORING, ED   CRITICAL CARE Performed by: Earleen Newport   Total critical care time: 30 minutes  Critical care time was exclusive of separately billable procedures and treating other patients.  Critical care was necessary to treat or prevent imminent or life-threatening deterioration.  Critical care was time spent personally by me on the following activities: development of treatment plan with patient and/or surrogate as well as nursing, discussions with consultants, evaluation of patient's response to treatment, examination of patient, obtaining history from patient or  surrogate, ordering and performing treatments and interventions, ordering and review of laboratory studies, ordering and review of radiographic studies, pulse oximetry and re-evaluation of patient's condition.  RADIOLOGY Images were viewed by me  Chest x-ray IMPRESSION: No acute cardiopulmonary disease. ____________________________________________  FINAL ASSESSMENT AND PLAN  Hypotension, weakness  Plan: Patient's labs and imaging were dictated above. Patient had presented for Weakness and was found to have hypotension. To this point no specific etiology has been discovered. She has been initiated on sepsis protocol, final disposition is pending at this time.   Earleen Newport, MD   Note: This note was generated in part or whole with voice recognition software. Voice recognition is usually quite accurate but there are transcription errors that can and very often do occur. I apologize for any typographical errors that were not detected and corrected.     Earleen Newport, MD 01/23/17 [phone]

## 2017-01-23 NOTE — Progress Notes (Signed)
Chaplain received a consult for Prayer for a Pt in Rm 136A. [place] visited and met with the Pt. Pt was lying on the bed at the time of this visit, but she did not seem to be in pain. Pt stated she wanted prayer for joy, peace, and serenity. Chaplain provided the prayer support and presence. Perry also informed the Pt that [facility] services were available as needed.   01/23/17 2200  Clinical Encounter Type  Visited With Patient  Visit Type Initial  Referral From Nurse  Consult/Referral To Chaplain  Spiritual Encounters  Spiritual Needs Prayer

## 2017-01-23 NOTE — ED Notes (Signed)
Spoke with Dr. Bridgett Larsson regarding pt 's hypotension:stated for pt to have a MAP >65 and that would be ok; relayed this to receiving nurse

## 2017-01-23 NOTE — ED Notes (Signed)
Patient transported to X-ray 

## 2017-01-23 NOTE — ED Triage Notes (Signed)
Pt to ed with c/o weakness and low blood pressure this am.  Pr caregiver pt is currently on abx for cellulitis and UTI.  Pt alert at triage, denies pain.

## 2017-01-24 ENCOUNTER — Observation Stay
Admit: 2017-01-24 | Discharge: 2017-01-24 | Disposition: A | Discharge status: Home / Self Care | Payer: Medicare Other | Attending: Internal Medicine | Admitting: Internal Medicine

## 2017-01-24 ENCOUNTER — Observation Stay: Payer: Medicare Other

## 2017-01-24 LAB — BASIC METABOLIC PANEL
Anion gap: 5 (ref 5–15)
BUN: 8 mg/dL (ref 6–20)
CHLORIDE: 116 mmol/L — AB (ref 101–111)
CO2: 22 mmol/L (ref 22–32)
Calcium: 7.5 mg/dL — ABNORMAL LOW (ref 8.9–10.3)
Creatinine, Ser: 1.03 mg/dL — ABNORMAL HIGH (ref 0.44–1.00)
GFR calc Af Amer: >60 mL/min (ref >60)
GLUCOSE: 107 mg/dL — AB (ref 65–99)
Potassium: 3.5 mmol/L (ref 3.5–5.1)
Sodium: 143 mmol/L (ref 135–145)

## 2017-01-24 LAB — URINE CULTURE: Culture: <10000 — AB

## 2017-01-24 LAB — HIV ANTIBODY (ROUTINE TESTING W REFLEX): HIV SCREEN 4TH GENERATION: NONREACTIVE

## 2017-01-24 LAB — CORTISOL: CORTISOL PLASMA: 28.4 ug/dL

## 2017-01-24 MED ORDER — MIDODRINE HCL 5 MG PO TABS
5.0000 mg | ORAL_TABLET | Freq: Three times a day (TID) | ORAL | Status: DC
  Administered 2017-01-24 (×2): 5 mg via ORAL
  Filled 2017-01-24 (×3): qty 1

## 2017-01-24 NOTE — Care Management Note (Signed)
Case Management Note  Patient Details  Name: MACKENNA KAMER MRN: [id number] Date of Birth: 1971/10/27  Subjective/Objective:   Patient active with [facility] for RN, PT and OT. Agency aware of admission.  Following. Possible dc today                 Action/Plan:   Expected Discharge Date:                  Expected Discharge Plan:  Council  In-House Referral:     Discharge planning Services  CM Consult  Post Acute Care Choice:  Home Health, Resumption of Svcs/PTA Provider Choice offered to:  [facility] POA / Guardian  DME Arranged:    DME Agency:     HH Arranged:  RN, PT, OT HH Agency:  [place] [facility]  Status of Service:  In process, will continue to follow  If discussed at Long Length of Stay Meetings, dates discussed:    Additional Comments:  Jolly Mango, RN 01/24/2017, 9:53 AM

## 2017-01-24 NOTE — Care Management Obs Status (Signed)
[place] NOTIFICATION   Patient Details  Name: [name] MRN: [id number] Date of Birth: [birth year]   Medicare Observation Status Notification Given:  Yes Barbaraann Share [place], [place])    Jolly Mango, RN 01/24/2017, 9:19 AM

## 2017-01-24 NOTE — Progress Notes (Signed)
PT is recommending SNF. Clinical Education officer, museum (CSW) contacted [facility] group home owner and made her aware of above. Per Marcelline Mates she is not sure patient needs SNF level of care. Cherry reported that she will come assess patient in person to see if they can meet her needs at the group home this afternoon or tomorrow. CSW made [facility] aware that patient is under observation and medicare will not pay for SNF and if she needs to be placed it will have to be under medicaid. CSW will continue to follow and assist as needed.   [employer], LCSW [phone]

## 2017-01-24 NOTE — Progress Notes (Signed)
Pt. Having loose stools. Dr. Marcille Blanco ordered for stool to be tested for c-diff.

## 2017-01-24 NOTE — Plan of Care (Signed)
Problem: Acute Rehab PT Goals(only PT should resolve) Goal: Patient Will Transfer Sit To/From Stand Outcome: Progressing Perform with RW. Goal: Pt Will Transfer Bed To Chair/Chair To Bed Outcome: Progressing With use of RW. Goal: Pt Will Perform Standing Balance Or Pre-Gait Outcome: Progressing With use of RW. Goal: Pt Will Ambulate With use of RW. Goal: Pt Will Verbalize and Adhere to Precautions While PT Will Verbalize and Adhere to Precautions While Performing Mobility  Outcome: Progressing Pt will verbalize and adhere to fall risk precautions while performing mobility.

## 2017-01-24 NOTE — Progress Notes (Signed)
[place] [facility]                                                                                                                                                                                  Patient Demographics   [name], is a 46 y.o. female, DOB - [birth year], YSA:[id number]  Admit date - 01/23/2017   Admitting Physician Demetrios Loll, MD  Outpatient Primary MD for the patient is Lorelee Market, MD   LOS - 0  Subjective: Patient continues to have low blood pressure. She also has some loose stools. But denies any other chest pains complains of feeling weak and dizzy with standing. No nausea vomiting or diarrhea    Review of Systems:   CONSTITUTIONAL: No documented fever. Positive  Fatigue and weakness. No weight gain, no weight loss.  EYES: No blurry or double vision.  ENT: No tinnitus. No postnasal drip. No redness of the oropharynx.  RESPIRATORY: No cough, no wheeze, no hemoptysis. No dyspnea.  CARDIOVASCULAR: No chest pain. No orthopnea. No palpitations. No syncope.  GASTROINTESTINAL: No nausea, no vomiting or diarrhea. No abdominal pain. No melena or hematochezia.  GENITOURINARY: No dysuria or hematuria.  ENDOCRINE: No polyuria or nocturia. No heat or cold intolerance.  HEMATOLOGY: No anemia. No bruising. No bleeding.  INTEGUMENTARY: No rashes. No lesions.  MUSCULOSKELETAL: No arthritis. No swelling. No gout.  NEUROLOGIC: No numbness, tingling, or ataxia. No seizure-type activity.  PSYCHIATRIC: No anxiety. No insomnia. No ADD.    Vitals:   Vitals:   01/23/17 2310 01/24/17 0622 01/24/17 0815 01/24/17 0900  BP: (!) 98/47 (!) 100/50 (!) 98/56 (!) 94/58  Pulse: 75 78 96 96  Resp: 20 20 18    Temp: 99.1 F (37.3 C) 98.8 F (37.1 C) 97.6 F (36.4 C)   TempSrc: Oral Oral Oral   SpO2: 98% 97% 99% 98%  Weight:      Height:        Wt Readings from Last 3 Encounters:  01/23/17 243 lb (110.2 kg)  10/18/16 255 lb 1.2 oz (115.7 kg)   02/22/16 270 lb (122.5 kg)     Intake/Output Summary (Last 24 hours) at 01/24/17 1301 Last data filed at 01/24/17 0955  Gross per 24 hour  Intake          4438.33 ml  Output                0 ml  Net          4438.33 ml    Physical Exam:   GENERAL: Pleasant-appearing in no apparent distress.  HEAD, EYES, EARS, NOSE AND THROAT: Atraumatic, normocephalic. Extraocular muscles are intact. Pupils  equal and reactive to light. Sclerae anicteric. No conjunctival injection. No oro-pharyngeal erythema.  NECK: Supple. There is no jugular venous distention. No bruits, no lymphadenopathy, no thyromegaly.  HEART: Regular rate and rhythm,. No murmurs, no rubs, no clicks.  LUNGS: Clear to auscultation bilaterally. No rales or rhonchi. No wheezes.  ABDOMEN: Soft, flat, nontender, nondistended. Has good bowel sounds. No hepatosplenomegaly appreciated.  EXTREMITIES: No evidence of any cyanosis, clubbing, or peripheral edema.  +2 pedal and radial pulses bilaterally.  NEUROLOGIC: The patient is alert, awake, and oriented x3 with no focal motor or sensory deficits appreciated bilaterally.  SKIN: Moist and warm with no rashes appreciated.  Psych: Not anxious, depressed LN: No inguinal LN enlargement    Antibiotics   Anti-infectives    Start     Dose/Rate Route Frequency Ordered Stop   01/23/17 1300  piperacillin-tazobactam (ZOSYN) IVPB 3.375 g     3.375 g 100 mL/hr over 30 Minutes Intravenous  Once 01/23/17 1258 01/23/17 1536   01/23/17 1300  vancomycin (VANCOCIN) IVPB 1000 mg/200 mL premix     1,000 mg 200 mL/hr over 60 Minutes Intravenous  Once 01/23/17 1258 01/23/17 1536      Medications   Scheduled Meds: . calcium carbonate  600 mg of elemental calcium Oral BID WC  . cholecalciferol  5,000 Units Oral Daily  . cholestyramine light  4 g Oral 2 times per day  . citalopram  40 mg Oral Daily  . cloZAPine  200 mg Oral QHS  . docusate sodium  100 mg Oral BID  . gemfibrozil  600 mg Oral BID   . heparin  5,000 Units Subcutaneous Q8H  . levothyroxine  25 mcg Oral Q0600  . LORazepam  1 mg Oral BH-q7a  . midodrine  5 mg Oral TID WC  . pantoprazole  40 mg Oral Daily  . potassium chloride  20 mEq Oral BID  . risperiDONE  1 mg Oral BID  . simvastatin  20 mg Oral QHS  . vitamin B-12  1,000 mcg Oral Daily   Continuous Infusions: . sodium chloride 125 mL/hr at 01/24/17 0138   PRN Meds:.acetaminophen **OR** acetaminophen, albuterol, bisacodyl, diphenhydrAMINE, guaiFENesin **AND** pseudoephedrine, LORazepam, mesalamine, mesalamine, ondansetron **OR** ondansetron (ZOFRAN) IV, senna-docusate   Data Review:   Micro Results Recent Results (from the past 240 hour(s))  Blood Culture (routine x 2)     Status: None (Preliminary result)   Collection Time: 01/23/17  1:03 PM  Result Value Ref Range Status   Specimen Description BLOOD L FA  Final   Special Requests   Final    BOTTLES DRAWN AEROBIC AND ANAEROBIC Blood Culture adequate volume   Culture NO GROWTH < 24 HOURS  Final   Report Status PENDING  Incomplete  Blood Culture (routine x 2)     Status: None (Preliminary result)   Collection Time: 01/23/17  1:03 PM  Result Value Ref Range Status   Specimen Description BLOOD R AC  Final   Special Requests   Final    BOTTLES DRAWN AEROBIC AND ANAEROBIC Blood Culture adequate volume   Culture NO GROWTH < 24 HOURS  Final   Report Status PENDING  Incomplete    Radiology Reports US Venous Img Lower Bilateral  Result Date: 01/24/2017 CLINICAL DATA:  Bilateral lower extremity swelling. EXAM: BILATERAL LOWER EXTREMITY VENOUS DOPPLER ULTRASOUND TECHNIQUE: Gray-scale sonography with graded compression, as well as color Doppler and duplex ultrasound were performed to evaluate the lower extremity deep venous systems from the level of  the common femoral vein and including the common femoral, femoral, profunda femoral, popliteal and calf veins including the posterior tibial, peroneal and  gastrocnemius veins when visible. The superficial great saphenous vein was also interrogated. Spectral Doppler was utilized to evaluate flow at rest and with distal augmentation maneuvers in the common femoral, femoral and popliteal veins. COMPARISON:  Ultrasound of July 27, 2012. FINDINGS: RIGHT LOWER EXTREMITY Common Femoral Vein: No evidence of thrombus. Normal compressibility, respiratory phasicity and response to augmentation. Saphenofemoral Junction: No evidence of thrombus. Normal compressibility and flow on color Doppler imaging. Profunda Femoral Vein: No evidence of thrombus. Normal compressibility and flow on color Doppler imaging. Femoral Vein: No evidence of thrombus. Normal compressibility, respiratory phasicity and response to augmentation. Popliteal Vein: No evidence of thrombus. Normal compressibility, respiratory phasicity and response to augmentation. Calf Veins: No evidence of thrombus. Normal compressibility and flow on color Doppler imaging. Superficial Great Saphenous Vein: No evidence of thrombus. Normal compressibility and flow on color Doppler imaging. Venous Reflux:  None. Other Findings:  None. LEFT LOWER EXTREMITY Common Femoral Vein: No evidence of thrombus. Normal compressibility, respiratory phasicity and response to augmentation. Saphenofemoral Junction: No evidence of thrombus. Normal compressibility and flow on color Doppler imaging. Profunda Femoral Vein: No evidence of thrombus. Normal compressibility and flow on color Doppler imaging. Femoral Vein: No evidence of thrombus. Normal compressibility, respiratory phasicity and response to augmentation. Popliteal Vein: No evidence of thrombus. Normal compressibility, respiratory phasicity and response to augmentation. Calf Veins: No evidence of thrombus. Normal compressibility and flow on color Doppler imaging. Superficial Great Saphenous Vein: No evidence of thrombus. Normal compressibility and flow on color Doppler imaging.  Venous Reflux:  None. Other Findings:  None. IMPRESSION: No evidence deep venous thrombosis seen in either lower extremity. Electronically Signed   By: Marijo Conception, M.D.   On: 01/24/2017 12:43   Dg Chest Port 1 View  Result Date: 01/23/2017 CLINICAL DATA:  Weakness. EXAM: PORTABLE CHEST 1 VIEW COMPARISON:  10/02/2016 . FINDINGS: Interim removal of IJ lines. Mediastinum and hilar structures normal. Heart size normal. No focal infiltrate. No pleural effusion or pneumothorax. No acute bony abnormality . IMPRESSION: No acute cardiopulmonary disease. Electronically Signed   By: Marcello Moores  Register   On: 01/23/2017 14:13     CBC  Recent Labs Lab 01/23/17 1302  WBC 9.2  HGB 8.7*  HCT 27.3*  PLT 481*  MCV 96.4  MCH 30.8  MCHC 31.9*  RDW 21.0*  LYMPHSABS 4.9*  MONOABS 0.5  EOSABS 0.0  BASOSABS 0.0    Chemistries   Recent Labs Lab 01/23/17 1302 01/23/17 1736 01/24/17 0255  NA 137  --  143  K 3.8  --  3.5  CL 109  --  116*  CO2 22  --  22  GLUCOSE 70  --  107*  BUN 9  --  8  CREATININE 1.34* 1.18* 1.03*  CALCIUM 8.3*  --  7.5*   ------------------------------------------------------------------------------------------------------------------ estimated creatinine clearance is 86.8 mL/min (A) (by C-G formula based on SCr of 1.03 mg/dL (H)). ------------------------------------------------------------------------------------------------------------------ No results for input(s): HGBA1C in the last 72 hours. ------------------------------------------------------------------------------------------------------------------ No results for input(s): CHOL, HDL, LDLCALC, TRIG, CHOLHDL, LDLDIRECT in the last 72 hours. ------------------------------------------------------------------------------------------------------------------ No results for input(s): TSH, T4TOTAL, T3FREE, THYROIDAB in the last 72 hours.  Invalid input(s):  FREET3 ------------------------------------------------------------------------------------------------------------------ No results for input(s): VITAMINB12, FOLATE, FERRITIN, TIBC, IRON, RETICCTPCT in the last 72 hours.  Coagulation profile No results for input(s): INR, PROTIME in the last 168 hours.  No results for input(s): DDIMER in the last 72 hours.  Cardiac Enzymes  Recent Labs Lab 01/23/17 1302  TROPONINI <0.03   ------------------------------------------------------------------------------------------------------------------ Invalid input(s): POCBNP    Assessment & Plan  Pt is 46 y.o with h/o chronic kidney disease stage III, hyperlipidemia, hypothyroidism and diabetes presenting with generalized weakness 1 Hypotension.   She needs to be hypotensive no source of infection identified I will check a random cortisol I will start patient on Midrodrine Decrease IVF rate  2. CKD stage III. Stable.  3. Hyperlipidemia. Continue Lopid.  4. Hypothyroidism. Continue Synthroid    5. Diabetes. Continue SSI  6. Generalized weakness.  SNF     Code Status Orders        Start     Ordered   01/23/17 1727  Full code  Continuous     01/23/17 1726    Code Status History    Date Active Date Inactive Code Status Order ID Comments User Context   09/27/2016  8:45 AM 10/18/2016  8:43 PM Full Code [id number]  Flora Lipps, MD ED           Consults  29min  DVT Prophylaxis  Lovenox    Lab Results  Component Value Date   PLT 481 (H) 01/23/2017     Time Spent in minutes  47min Greater than 50% of time spent in care coordination and counseling patient regarding the condition and plan of care.   Dustin Flock M.D on 01/24/2017 at 1:01 PM  Between 7am to 6pm - Pager - [phone]  After 6pm go to [email or web address] - password [email or web address] [facility]   Office  [phone]

## 2017-01-24 NOTE — Evaluation (Signed)
Physical Therapy Evaluation Patient Details Name: [name] MRN: [id number] DOB: August 09, 1971 Today's Date: 01/24/2017   History of Present Illness  Pt is a 46 y.o. F admitted on 01/23/17 for weakness and hypotension. Prior to admission pt has been on antibiotics for cellulitis and UTI with concern for sepsis. Pt being tested for C-diff due to loose bowel movement. Pt PMH includes Crohn's disease, depression, DM, HLD, HTN, hypothyroidism, R eye mass, schizophrenia, TBI, and ARF.  Clinical Impression  Pt was awake in the bed upon PT arrival; pt agreeable to working with PT. At the beginning of PT session the pt stated that she needed to use the restroom and could not wait, so pt was place on the bed pan requiring min assist to roll and provided some privacy as she stated that her bladder was "shy." D/t the bed pan spilling during removal, pt performed bed mobility with min assist with PT in order to get cleaned up. Pt required mod assist with all bed mobility and transfers and +2 HHA to stand. Pt was put back to bed due to low BP taken during session. Pt would benefit from skilled PT during admission to decrease generalized weakness and improve functional mobility. PT will attempt transfers and ambulation with pt. during next session if BP is more appropriate. PT recommending SNF at d/c due to pt weakness, level of assistance, and hx of falls.     Follow Up Recommendations SNF    Equipment Recommendations  Rolling walker with 5" wheels    Recommendations for Other Services       Precautions / Restrictions Precautions Precautions: Fall Restrictions Weight Bearing Restrictions: No      Mobility  Bed Mobility Overal bed mobility: Needs Assistance Bed Mobility: Rolling;Sidelying to Sit;Sit to Supine Rolling: Min assist Sidelying to sit: Mod assist (Pt. needed mod assist with trunk.)   Sit to supine: Mod assist (Pt needed mod assist to lift LEs into bed.)   General bed mobility  comments: Pt. given v/c's to reach for bed rail to roll in bed; Pt. able to bilaterally roll halfway in bed (modified independent using bed railing), pt required min assist to complete rolling onto side; v/c's given for pt. to push through left arm for sidelying to sit, but pt. required mod assist with trunk to complete due to UE weakness; Pt needed mod assist for LEs during sit to supine; pt required max assist of 2 to slide up in bed in supine.  Transfers Overall transfer level: Needs assistance Equipment used: 2 person hand held assist Transfers: Sit to/from Stand Sit to Stand: Mod assist;+2 physical assistance         General transfer comment: Pt. stood from EOB to allow nursing to change the sheets on the bed; v/c's given for pt. to side step to the left, but d/t LE weakness pt was unable to do so.  Ambulation/Gait                Stairs            Wheelchair Mobility    Modified Rankin (Stroke Patients Only)       Balance Overall balance assessment: Needs assistance Sitting-balance support: Bilateral upper extremity supported;Feet supported Sitting balance-Leahy Scale: Good Sitting balance - Comments: Pt able to maintain sitting balance with CGA for safety and while performing LE exercises in sitting.   Standing balance support: Bilateral upper extremity supported Standing balance-Leahy Scale: Poor Standing balance comment: Pt. required mod assist of 2  with bilateral hand held assist to static stand.                             Pertinent Vitals/Pain Pain Assessment: No/denies pain  Pt Vitals in supine: Pulse: 96 bpm; BP: 94/58; O2 sats: 98% on RA Pt Vitals in sitting: BP: 90/51 Nursing notified of pt's BP during session.    Home Living Family/patient expects to be discharged to:: Group home (Pt lives at [facility].)                 Additional Comments: Pt lives at [facility]; pt reports having 24 hr assistance  for mobility and medications and pt uses a [employer] service to take her to and from school; Pt reports 1 step w/o rails to enter the Group home; Pt reprots that the bathroom is accessable for her with her rollator and she uses a high toilet w/ grab bars and a tub shower with a grab bar on the L.    Prior Function Level of Independence: Needs assistance   Gait / Transfers Assistance Needed: Pt. reports that she ambulates with a rollator in the group home and in the community and is provided assistance with mobility in and out of bed.           Hand Dominance        Extremity/Trunk Assessment   Upper Extremity Assessment Upper Extremity Assessment: Generalized weakness (Pt. presents grossly at least 3/5 strength in all movements, not specifically tested, no apparent sensation or coordination deficits; may present with functional limitations during activity.)    Lower Extremity Assessment Lower Extremity Assessment: Generalized weakness (Pt presents grossly at least 3/5 strength in all movements, not specifically tested d/t pt being tender to touch on LE.)    Cervical / Trunk Assessment Cervical / Trunk Assessment: Normal  Communication   Communication: No difficulties  Cognition Arousal/Alertness: Awake/alert Behavior During Therapy: WFL for tasks assessed/performed Overall Cognitive Status: No family/caregiver present to determine baseline cognitive functioning                                 General Comments: Pt. orianted to self and place, but had difficulty recalling the date, month, and day and reason for being in the hospital. This may be baseline as pt has a hx of cognitive impairments.      General Comments General comments (skin integrity, edema, etc.): Pt. was placed on a bed pan at the beginning of PT session, pt voided and bed pan spilled into bed with removal; pt. was cleaned and provided a new gown and adult diaper, nursing replace bed linen during PT  session.    Exercises General Exercises - Lower Extremity Ankle Circles/Pumps: AROM;Both;10 reps;Seated Long Arc Quad: AROM;Both;10 reps;Seated Hip Flexion/Marching: AROM;Both;10 reps;Seated (Limited hip AROM d/t LE weakness.)   Assessment/Plan    PT Assessment Patient needs continued PT services  PT Problem List Decreased strength;Decreased mobility;Decreased activity tolerance;Decreased balance       PT Treatment Interventions DME instruction;Therapeutic activities;Gait training;Therapeutic exercise;Patient/family education;Balance training;Stair training;Functional mobility training    PT Goals (Current goals can be found in the Care Plan section)  Acute Rehab PT Goals Patient Stated Goal: To sit up in the chair and walk. PT Goal Formulation: With patient Time For Goal Achievement: 02/07/17 Potential to Achieve Goals: Good    Frequency Min  2X/week   Barriers to discharge Other (comment) Pt. reports that she has 24 hr assistance at group home, however no family/caregiver was present to confirm; Pt also reports having 2 falls within that last 6 months at the group home prior to current admission.    Co-evaluation               End of Session   Activity Tolerance: Treatment limited secondary to medical complications (Comment) (Ambulation and transfer to chair held until next PT session for safety d/t pt's low BP. ) Patient left: in bed;with nursing/sitter in room;with call bell/phone within reach;with bed alarm set Nurse Communication: Precautions;Mobility status (via white board.) PT Visit Diagnosis: Unsteadiness on feet (R26.81);Muscle weakness (generalized) (M62.81);History of falling (Z91.81)    Time: 0037-9444 PT Time Calculation (min) (ACUTE ONLY): 50 min   Charges:         PT G Codes:         Kallee Nam, SPT 01/24/2017, 11:02 AM

## 2017-01-24 NOTE — NC FL2 (Signed)
[place] LEVEL OF CARE SCREENING TOOL     IDENTIFICATION  Patient Name: [name] Birthdate: 04/12/1971 Sex: female Admission Date (Current Location): 01/23/2017  [facility] [place] Number:  [name]  ([id number]) Facility and Address:  [facility], [address] [place], [place] [address]      Provider Number: [id number]  Attending Physician Name and Address:  Dustin Flock, MD  Relative Name and Phone Number:       Current Level of Care: Hospital Recommended Level of Care: Other (Comment) (Group Home ) Prior Approval Number:    Date Approved/Denied:   PASRR Number:  ([id number] )  Discharge Plan: Domiciliary (Rest home)    Current Diagnoses: Patient Active Problem List   Diagnosis Date Noted  . Hypotension 01/23/2017  . Scalp laceration   . Pressure injury of skin 10/02/2016  . 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 Acute renal failure (ARF) ([place])   . Respiratory failure ([place]) 09/27/2016  . Other sign and symptom in breast     Orientation RESPIRATION BLADDER Height & Weight     Self, Time, Place  Normal Incontinent Weight: 243 lb (110.2 kg) Height:  5\' 6"  (167.6 cm)  BEHAVIORAL SYMPTOMS/MOOD NEUROLOGICAL BOWEL NUTRITION STATUS   (none)  (none) Incontinent Diet (Diet: Heart Healthy )  AMBULATORY STATUS COMMUNICATION OF NEEDS Skin   Limited Assist Verbally Normal                       Personal Care Assistance Level of Assistance  Bathing, Feeding, Dressing Bathing Assistance: Limited assistance Feeding assistance: Independent Dressing Assistance: Limited assistance     Functional Limitations Info  Sight, Hearing, Speech Sight Info: Adequate Hearing Info: Adequate Speech Info: Adequate    SPECIAL CARE FACTORS FREQUENCY  PT (By licensed PT)     PT Frequency:  (2-3 days per week via home health )              Contractures      Additional Factors Info  Code Status,  Allergies, Isolation Precautions Code Status Info:  (Full Code. ) Allergies Info:  (Peanut Oil, Risperidone And Related)     Isolation Precautions Info:  (Enteric precautions)     Current Medications (01/24/2017):  This is the current hospital active medication list Current Facility-Administered Medications  Medication Dose Route Frequency Provider Last Rate Last Dose  . 0.9 %  sodium chloride infusion   Intravenous Continuous Demetrios Loll, MD 125 mL/hr at 01/24/17 0138    . acetaminophen (TYLENOL) tablet 650 mg  650 mg Oral Q6H PRN Demetrios Loll, MD       Or  . acetaminophen (TYLENOL) suppository 650 mg  650 mg Rectal Q6H PRN Demetrios Loll, MD      . albuterol (PROVENTIL) (2.5 MG/3ML) 0.083% nebulizer solution 2.5 mg  2.5 mg Nebulization Q2H PRN Demetrios Loll, MD      . bisacodyl (DULCOLAX) EC tablet 5 mg  5 mg Oral Daily PRN Demetrios Loll, MD      . calcium carbonate (TUMS - dosed in mg elemental calcium) chewable tablet 600 mg of elemental calcium  600 mg of elemental calcium Oral BID WC Demetrios Loll, MD   600 mg of elemental calcium at 01/24/17 0922  . cholecalciferol (VITAMIN D) tablet 5,000 Units  5,000 Units Oral Daily Demetrios Loll, MD   5,000 Units at 01/24/17 [phone]  . cholestyramine light (PREVALITE) packet 4 g  4 g Oral 2 times per day  Demetrios Loll, MD   4 g at 01/23/17 2059  . citalopram (CELEXA) tablet 40 mg  40 mg Oral Daily Demetrios Loll, MD   40 mg at 01/24/17 6269  . cloZAPine (CLOZARIL) tablet 200 mg  200 mg Oral QHS Demetrios Loll, MD   200 mg at 01/23/17 2059  . diphenhydrAMINE (BENADRYL) capsule 50 mg  50 mg Oral QHS PRN Demetrios Loll, MD      . docusate sodium (COLACE) capsule 100 mg  100 mg Oral BID Demetrios Loll, MD   100 mg at 01/24/17 4854  . gemfibrozil (LOPID) tablet 600 mg  600 mg Oral BID Demetrios Loll, MD   600 mg at 01/24/17 0950  . guaiFENesin (MUCINEX) 12 hr tablet 1,200 mg  1,200 mg Oral BID PRN Demetrios Loll, MD       And  . pseudoephedrine (SUDAFED) 12 hr tablet 120 mg  120 mg Oral BID PRN Demetrios Loll, MD       . heparin injection 5,000 Units  5,000 Units Subcutaneous Q8H Demetrios Loll, MD   5,000 Units at 01/24/17 [phone]  . levothyroxine (SYNTHROID, LEVOTHROID) tablet 25 mcg  25 mcg Oral Q0600 Demetrios Loll, MD   25 mcg at 01/24/17 [phone]  . LORazepam (ATIVAN) tablet 0.5 mg  0.5 mg Oral Q4H PRN Demetrios Loll, MD      . LORazepam (ATIVAN) tablet 1 mg  1 mg Oral Beaulah Dinning, MD   1 mg at 01/24/17 0609  . mesalamine (APRISO) 24 hr capsule 1.5 g  1.5 g Oral Daily PRN Demetrios Loll, MD      . mesalamine (ROWASA) enema 4 g  4 g Rectal QHS PRN Demetrios Loll, MD      . midodrine (PROAMATINE) tablet 5 mg  5 mg Oral TID WC Dustin Flock, MD      . ondansetron (ZOFRAN) tablet 4 mg  4 mg Oral Q6H PRN Demetrios Loll, MD       Or  . ondansetron [facility]) injection 4 mg  4 mg Intravenous Q6H PRN Demetrios Loll, MD      . pantoprazole (PROTONIX) EC tablet 40 mg  40 mg Oral Daily Demetrios Loll, MD   40 mg at 01/24/17 9381  . potassium chloride (K-DUR) CR tablet 20 mEq  20 mEq Oral BID Demetrios Loll, MD   20 mEq at 01/24/17 8299  . risperiDONE (RISPERDAL M-TABS) disintegrating tablet 1 mg  1 mg Oral BID Demetrios Loll, MD   1 mg at 01/24/17 3716  . senna-docusate (Senokot-S) tablet 1 tablet  1 tablet Oral QHS PRN Demetrios Loll, MD      . simvastatin (ZOCOR) tablet 20 mg  20 mg Oral QHS Demetrios Loll, MD   20 mg at 01/23/17 2104  . vitamin B-12 (CYANOCOBALAMIN) tablet 1,000 mcg  1,000 mcg Oral Daily Demetrios Loll, MD   1,000 mcg at 01/24/17 9678     Discharge Medications: Please see discharge summary for a list of discharge medications.  Relevant Imaging Results:  Relevant Lab Results:   Additional Information  (SSN: [id number])  Marie Chow, Veronia Beets, LCSW

## 2017-01-24 NOTE — Clinical Social Work Note (Addendum)
Clinical Social Work Assessment  Patient Details  Name: [name] MRN: [id number] Date of Birth: Apr 21, 1971  Date of referral:  01/24/17               Reason for consult:  Facility Placement, Discharge Planning                Permission sought to share information with:  Chartered certified accountant granted to share information::  Yes, Verbal Permission Granted  Name::      [place]::   [place]   Relationship::     Contact Information:     Housing/Transportation Living arrangements for the past 2 months:  Group Home ([facility]) Source of Information:  Patient, Power of Attorney Patient Interpreter Needed:  None Criminal Activity/Legal Involvement Pertinent to Current Situation/Hospitalization:  No - Comment as needed Significant Relationships:  Other Family Members Lives with:  Facility Resident ([facility]) Do you feel safe going back to the place where you live?  Yes Need for family participation in patient care:  Yes (Comment)  Care giving concerns: Patient is a resident at at [facility] in [place] (fax: [phone]).    Social Worker assessment / plan: Holiday representative (CSW) noted that patient was from a group home. Patient was sitting up in bed and was alert and oriented. Social work Theatre manager met with patient alone at bedside and introduced self. Per patient, she has been a resident at [facility] for 2 years now.  Patient's aunt Barbaraann Share is patient HPOA. Patient uses a walker to transport at the facility. Patient verbally agreed she wants to return to [facility].    CSW contacted group home and spoke with employee Evette. Per Evette patient has been at the group home since 2014 and her aunt Barbaraann Share is her HPOA. Per Evette patient walks with a walker at baseline and is not on oxygen. Per Evette she will send a group home staff member to pick patient up once she is  discharged. CSW also contacted group home owner Janetta Hora who reported that patietn can return when stable and has home health PT and nursing.   CSW contacted patient's aunt Barbaraann Share and made her aware of above. Per Barbaraann Share she lives in [place] and is patient's HPOA. Barbaraann Share is agreeable for patient to return to the group home.    Employment status:  Unemployed Forensic scientist:  Medicare PT Recommendations:  Not assessed at this time Information / Referral to community resources:     Patient/Family's Response to care:  Patient verbally agreed she wants to return to [facility].   Patient/Family's Understanding of and Emotional Response to Diagnosis, Current Treatment, and Prognosis:  Patient was pleasant and thanked social work Theatre manager for visiting.   Emotional Assessment Appearance:  Appears stated age Attitude/Demeanor/Rapport:    Affect (typically observed):  Accepting, Adaptable, Appropriate Orientation:  Oriented to Self, Oriented to Place, Oriented to  Time, Oriented to Situation Alcohol / Substance use:  Not Applicable Psych involvement (Current and /or in the community):  No (Comment)  Discharge Needs  Concerns to be addressed:  Basic Needs Readmission within the last 30 days:  No Current discharge risk:  Chronically ill Barriers to Discharge:  Continued Medical Work up   [employer], [place] Work 01/24/2017, 9:56 AM

## 2017-01-24 NOTE — Progress Notes (Signed)
Clinical Education officer, museum (CSW) received a call from patient's aunt/ HPOA Louise. Per aunt she would like for patient to go back to [facility] because she has been doing well there. CSW explained that PT is recommending SNF, however group home owner Janetta Hora is coming to assess the patient to see if they can meet her needs at the group home. Aunt verbalized her understanding. CSW will continue to follow and assist as needed.   [employer], LCSW [phone]

## 2017-01-25 DIAGNOSIS — L0390 Cellulitis, unspecified: Secondary | ICD-10-CM | POA: Diagnosis present

## 2017-01-25 DIAGNOSIS — E669 Obesity, unspecified: Secondary | ICD-10-CM | POA: Diagnosis present

## 2017-01-25 DIAGNOSIS — N183 Chronic kidney disease, stage 3 (moderate): Secondary | ICD-10-CM | POA: Diagnosis present

## 2017-01-25 DIAGNOSIS — I959 Hypotension, unspecified: Secondary | ICD-10-CM | POA: Diagnosis present

## 2017-01-25 DIAGNOSIS — Z87820 Personal history of traumatic brain injury: Secondary | ICD-10-CM | POA: Diagnosis not present

## 2017-01-25 DIAGNOSIS — R600 Localized edema: Secondary | ICD-10-CM | POA: Diagnosis present

## 2017-01-25 DIAGNOSIS — E1122 Type 2 diabetes mellitus with diabetic chronic kidney disease: Secondary | ICD-10-CM | POA: Diagnosis present

## 2017-01-25 DIAGNOSIS — F329 Major depressive disorder, single episode, unspecified: Secondary | ICD-10-CM | POA: Diagnosis present

## 2017-01-25 DIAGNOSIS — D638 Anemia in other chronic diseases classified elsewhere: Secondary | ICD-10-CM | POA: Diagnosis present

## 2017-01-25 DIAGNOSIS — K219 Gastro-esophageal reflux disease without esophagitis: Secondary | ICD-10-CM | POA: Diagnosis present

## 2017-01-25 DIAGNOSIS — E785 Hyperlipidemia, unspecified: Secondary | ICD-10-CM | POA: Diagnosis present

## 2017-01-25 DIAGNOSIS — Z6839 Body mass index (BMI) 39.0-39.9, adult: Secondary | ICD-10-CM | POA: Diagnosis not present

## 2017-01-25 DIAGNOSIS — E559 Vitamin D deficiency, unspecified: Secondary | ICD-10-CM | POA: Diagnosis present

## 2017-01-25 DIAGNOSIS — N390 Urinary tract infection, site not specified: Secondary | ICD-10-CM | POA: Diagnosis present

## 2017-01-25 DIAGNOSIS — K5090 Crohn's disease, unspecified, without complications: Secondary | ICD-10-CM | POA: Diagnosis present

## 2017-01-25 DIAGNOSIS — I129 Hypertensive chronic kidney disease with stage 1 through stage 4 chronic kidney disease, or unspecified chronic kidney disease: Secondary | ICD-10-CM | POA: Diagnosis present

## 2017-01-25 DIAGNOSIS — Z79899 Other long term (current) drug therapy: Secondary | ICD-10-CM | POA: Diagnosis not present

## 2017-01-25 DIAGNOSIS — I950 Idiopathic hypotension: Secondary | ICD-10-CM | POA: Diagnosis present

## 2017-01-25 DIAGNOSIS — E039 Hypothyroidism, unspecified: Secondary | ICD-10-CM | POA: Diagnosis present

## 2017-01-25 DIAGNOSIS — F209 Schizophrenia, unspecified: Secondary | ICD-10-CM | POA: Diagnosis present

## 2017-01-25 LAB — CBC WITH DIFFERENTIAL/PLATELET
BASOS ABS: 0.1 10*3/uL (ref 0–0.1)
Basophils Relative: 1 %
EOS ABS: 0.1 10*3/uL (ref 0–0.7)
Eosinophils Relative: 1 %
HEMATOCRIT: 23 % — AB (ref 35.0–47.0)
HEMOGLOBIN: 7.2 g/dL — AB (ref 12.0–16.0)
LYMPHS PCT: 60 %
Lymphs Abs: 5.9 10*3/uL — ABNORMAL HIGH (ref 1.0–3.6)
MCH: 31.2 pg (ref 26.0–34.0)
MCHC: 31.4 g/dL — ABNORMAL LOW (ref 32.0–36.0)
MCV: 99.3 fL (ref 80.0–100.0)
MONOS PCT: 6 %
Monocytes Absolute: 0.6 10*3/uL (ref 0.2–0.9)
NEUTROS ABS: 3.2 10*3/uL (ref 1.4–6.5)
NEUTROS PCT: 32 %
Platelets: 401 10*3/uL (ref 150–440)
RBC: 2.32 MIL/uL — AB (ref 3.80–5.20)
RDW: 20.2 % — ABNORMAL HIGH (ref 11.5–14.5)
WBC: 9.9 10*3/uL (ref 3.6–11.0)

## 2017-01-25 LAB — C DIFFICILE QUICK SCREEN W PCR REFLEX
C Diff antigen: NEGATIVE
C Diff interpretation: NOT DETECTED
C Diff toxin: NEGATIVE

## 2017-01-25 LAB — IRON AND TIBC: IRON: 28 ug/dL (ref 28–170)

## 2017-01-25 LAB — FERRITIN: FERRITIN: 467 ng/mL — AB (ref 11–307)

## 2017-01-25 LAB — VITAMIN B12: Vitamin B-12: 758 pg/mL (ref 180–914)

## 2017-01-25 LAB — FOLATE: Folate: 3.7 ng/mL — ABNORMAL LOW (ref >5.9)

## 2017-01-25 MED ORDER — SODIUM CHLORIDE 0.9 % IV BOLUS (SEPSIS): 1000.0000 mL | INTRAVENOUS | Status: DC | PRN

## 2017-01-25 MED ORDER — MIDODRINE HCL 5 MG PO TABS
10.0000 mg | ORAL_TABLET | Freq: Three times a day (TID) | ORAL | Status: DC
  Administered 2017-01-25 – 2017-01-28 (×11): 10 mg via ORAL
  Filled 2017-01-25 (×11): qty 2
  Filled 2017-01-25: qty 1
  Filled 2017-01-25: qty 2

## 2017-01-25 MED ORDER — AMOXICILLIN-POT CLAVULANATE 875-125 MG PO TABS
1.0000 | ORAL_TABLET | Freq: Two times a day (BID) | ORAL | Status: DC
  Administered 2017-01-25 – 2017-01-28 (×7): 1 via ORAL
  Filled 2017-01-25 (×8): qty 1

## 2017-01-25 NOTE — Progress Notes (Signed)
[place] at [facility]                                                                                                                                                                                  Patient Demographics   [name], is a 46 y.o. female, DOB - Dec 01, 1970, NLG:[id number]  Admit date - 01/23/2017   Admitting Physician Demetrios Loll, MD  Outpatient Primary MD for the patient is Lorelee Market, MD   LOS - 0  Subjective: Patient had another episode of low blood pressure earlier today. She is asymptomatic.   Review of Systems:   CONSTITUTIONAL: No documented fever. Positive  Fatigue and weakness. No weight gain, no weight loss.  EYES: No blurry or double vision.  ENT: No tinnitus. No postnasal drip. No redness of the oropharynx.  RESPIRATORY: No cough, no wheeze, no hemoptysis. No dyspnea.  CARDIOVASCULAR: No chest pain. No orthopnea. No palpitations. No syncope.  GASTROINTESTINAL: No nausea, no vomiting or diarrhea. No abdominal pain. No melena or hematochezia.  GENITOURINARY: No dysuria or hematuria.  ENDOCRINE: No polyuria or nocturia. No heat or cold intolerance.  HEMATOLOGY: No anemia. No bruising. No bleeding.  INTEGUMENTARY: No rashes. No lesions.  MUSCULOSKELETAL: No arthritis. No swelling. No gout.  NEUROLOGIC: No numbness, tingling, or ataxia. No seizure-type activity.  PSYCHIATRIC: No anxiety. No insomnia. No ADD.    Vitals:   Vitals:   01/25/17 0704 01/25/17 0750 01/25/17 0757 01/25/17 1059  BP: (!) 98/50 (!) 62/38 (!) 76/46 (!) 80/52  Pulse:  80  77  Resp:  16    Temp:  97.4 F (36.3 C)  97.7 F (36.5 C)  TempSrc:  Oral  Oral  SpO2:  92%  99%  Weight:      Height:        Wt Readings from Last 3 Encounters:  01/23/17 243 lb (110.2 kg)  10/18/16 255 lb 1.2 oz (115.7 kg)  02/22/16 270 lb (122.5 kg)     Intake/Output Summary (Last 24 hours) at 01/25/17 1334 Last data filed at 01/25/17 1324  Gross per 24 hour   Intake          3242.08 ml  Output              650 ml  Net          2592.08 ml    Physical Exam:   GENERAL: Pleasant-appearing in no apparent distress.  HEAD, EYES, EARS, NOSE AND THROAT: Atraumatic, normocephalic. Extraocular muscles are intact. Pupils equal and reactive to light. Sclerae anicteric. No conjunctival injection. No oro-pharyngeal erythema.  NECK: Supple. There is no jugular venous distention. No bruits, no  lymphadenopathy, no thyromegaly.  HEART: Regular rate and rhythm,. No murmurs, no rubs, no clicks.  LUNGS: Clear to auscultation bilaterally. No rales or rhonchi. No wheezes.  ABDOMEN: Soft, flat, nontender, nondistended. Has good bowel sounds. No hepatosplenomegaly appreciated.  EXTREMITIES: No evidence of any cyanosis, clubbing, or 2+ peripheral edema.  +2 pedal and radial pulses bilaterally.  NEUROLOGIC: The patient is alert, awake, and oriented x3 with no focal motor or sensory deficits appreciated bilaterally.  SKIN: She has a wound on the right lower extremity. With some drainage Psych: Not anxious, depressed LN: No inguinal LN enlargement    Antibiotics   Anti-infectives    Start     Dose/Rate Route Frequency Ordered Stop   01/25/17 1200  amoxicillin-clavulanate (AUGMENTIN) 875-125 MG per tablet 1 tablet     1 tablet Oral Every 12 hours 01/25/17 1126     01/23/17 1300  piperacillin-tazobactam (ZOSYN) IVPB 3.375 g     3.375 g 100 mL/hr over 30 Minutes Intravenous  Once 01/23/17 1258 01/23/17 1536   01/23/17 1300  vancomycin (VANCOCIN) IVPB 1000 mg/200 mL premix     1,000 mg 200 mL/hr over 60 Minutes Intravenous  Once 01/23/17 1258 01/23/17 1536      Medications   Scheduled Meds: . amoxicillin-clavulanate  1 tablet Oral Q12H  . calcium carbonate  600 mg of elemental calcium Oral BID WC  . cholecalciferol  5,000 Units Oral Daily  . cholestyramine light  4 g Oral 2 times per day  . citalopram  40 mg Oral Daily  . docusate sodium  100 mg Oral BID  .  gemfibrozil  600 mg Oral BID  . heparin  5,000 Units Subcutaneous Q8H  . levothyroxine  25 mcg Oral Q0600  . LORazepam  1 mg Oral BH-q7a  . midodrine  10 mg Oral TID WC  . pantoprazole  40 mg Oral Daily  . potassium chloride  20 mEq Oral BID  . risperiDONE  1 mg Oral BID  . simvastatin  20 mg Oral QHS  . vitamin B-12  1,000 mcg Oral Daily   Continuous Infusions: . sodium chloride 75 mL/hr at 01/24/17 2345   PRN Meds:.acetaminophen **OR** acetaminophen, albuterol, bisacodyl, diphenhydrAMINE, guaiFENesin **AND** pseudoephedrine, LORazepam, mesalamine, mesalamine, ondansetron **OR** ondansetron (ZOFRAN) IV, senna-docusate, sodium chloride   Data Review:   Micro Results Recent Results (from the past 240 hour(s))  Blood Culture (routine x 2)     Status: None (Preliminary result)   Collection Time: 01/23/17  1:03 PM  Result Value Ref Range Status   Specimen Description BLOOD L FA  Final   Special Requests   Final    BOTTLES DRAWN AEROBIC AND ANAEROBIC Blood Culture adequate volume   Culture NO GROWTH 2 DAYS  Final   Report Status PENDING  Incomplete  Blood Culture (routine x 2)     Status: None (Preliminary result)   Collection Time: 01/23/17  1:03 PM  Result Value Ref Range Status   Specimen Description BLOOD R AC  Final   Special Requests   Final    BOTTLES DRAWN AEROBIC AND ANAEROBIC Blood Culture adequate volume   Culture NO GROWTH 2 DAYS  Final   Report Status PENDING  Incomplete  Urine culture     Status: Abnormal   Collection Time: 01/23/17  3:40 PM  Result Value Ref Range Status   Specimen Description URINE, RANDOM  Final   Special Requests NONE  Final   Culture (A)  Final    <10,000 COLONIES/mL  INSIGNIFICANT GROWTH Performed at [facility], [place] [address] [place], [place] [address]    Report Status 01/24/2017 FINAL  Final    Radiology Reports US Venous Img Lower Bilateral  Result Date: 01/24/2017 CLINICAL DATA:  Bilateral lower extremity swelling. EXAM:  BILATERAL LOWER EXTREMITY VENOUS DOPPLER ULTRASOUND TECHNIQUE: Gray-scale sonography with graded compression, as well as color Doppler and duplex ultrasound were performed to evaluate the lower extremity deep venous systems from the level of the common femoral vein and including the common femoral, femoral, profunda femoral, popliteal and calf veins including the posterior tibial, peroneal and gastrocnemius veins when visible. The superficial great saphenous vein was also interrogated. Spectral Doppler was utilized to evaluate flow at rest and with distal augmentation maneuvers in the common femoral, femoral and popliteal veins. COMPARISON:  Ultrasound of July 27, 2012. FINDINGS: RIGHT LOWER EXTREMITY Common Femoral Vein: No evidence of thrombus. Normal compressibility, respiratory phasicity and response to augmentation. Saphenofemoral Junction: No evidence of thrombus. Normal compressibility and flow on color Doppler imaging. Profunda Femoral Vein: No evidence of thrombus. Normal compressibility and flow on color Doppler imaging. Femoral Vein: No evidence of thrombus. Normal compressibility, respiratory phasicity and response to augmentation. Popliteal Vein: No evidence of thrombus. Normal compressibility, respiratory phasicity and response to augmentation. Calf Veins: No evidence of thrombus. Normal compressibility and flow on color Doppler imaging. Superficial Great Saphenous Vein: No evidence of thrombus. Normal compressibility and flow on color Doppler imaging. Venous Reflux:  None. Other Findings:  None. LEFT LOWER EXTREMITY Common Femoral Vein: No evidence of thrombus. Normal compressibility, respiratory phasicity and response to augmentation. Saphenofemoral Junction: No evidence of thrombus. Normal compressibility and flow on color Doppler imaging. Profunda Femoral Vein: No evidence of thrombus. Normal compressibility and flow on color Doppler imaging. Femoral Vein: No evidence of thrombus. Normal  compressibility, respiratory phasicity and response to augmentation. Popliteal Vein: No evidence of thrombus. Normal compressibility, respiratory phasicity and response to augmentation. Calf Veins: No evidence of thrombus. Normal compressibility and flow on color Doppler imaging. Superficial Great Saphenous Vein: No evidence of thrombus. Normal compressibility and flow on color Doppler imaging. Venous Reflux:  None. Other Findings:  None. IMPRESSION: No evidence deep venous thrombosis seen in either lower extremity. Electronically Signed   By: Marijo Conception, M.D.   On: 01/24/2017 12:43   Dg Chest Port 1 View  Result Date: 01/23/2017 CLINICAL DATA:  Weakness. EXAM: PORTABLE CHEST 1 VIEW COMPARISON:  10/02/2016 . FINDINGS: Interim removal of IJ lines. Mediastinum and hilar structures normal. Heart size normal. No focal infiltrate. No pleural effusion or pneumothorax. No acute bony abnormality . IMPRESSION: No acute cardiopulmonary disease. Electronically Signed   By: Marcello Moores  Register   On: 01/23/2017 14:13     CBC  Recent Labs Lab 01/23/17 1302 01/25/17 1145  WBC 9.2 9.9  HGB 8.7* 7.2*  HCT 27.3* 23.0*  PLT 481* 401  MCV 96.4 99.3  MCH 30.8 31.2  MCHC 31.9* 31.4*  RDW 21.0* 20.2*  LYMPHSABS 4.9* 5.9*  MONOABS 0.5 0.6  EOSABS 0.0 0.1  BASOSABS 0.0 0.1    Chemistries   Recent Labs Lab 01/23/17 1302 01/23/17 1736 01/24/17 0255  NA 137  --  143  K 3.8  --  3.5  CL 109  --  116*  CO2 22  --  22  GLUCOSE 70  --  107*  BUN 9  --  8  CREATININE 1.34* 1.18* 1.03*  CALCIUM 8.3*  --  7.5*   ------------------------------------------------------------------------------------------------------------------  estimated creatinine clearance is 86.8 mL/min (A) (by C-G formula based on SCr of 1.03 mg/dL (H)). ------------------------------------------------------------------------------------------------------------------ No results for input(s): HGBA1C in the last 72  hours. ------------------------------------------------------------------------------------------------------------------ No results for input(s): CHOL, HDL, LDLCALC, TRIG, CHOLHDL, LDLDIRECT in the last 72 hours. ------------------------------------------------------------------------------------------------------------------ No results for input(s): TSH, T4TOTAL, T3FREE, THYROIDAB in the last 72 hours.  Invalid input(s): FREET3 ------------------------------------------------------------------------------------------------------------------ No results for input(s): VITAMINB12, FOLATE, FERRITIN, TIBC, IRON, RETICCTPCT in the last 72 hours.  Coagulation profile No results for input(s): INR, PROTIME in the last 168 hours.  No results for input(s): DDIMER in the last 72 hours.  Cardiac Enzymes  Recent Labs Lab 01/23/17 1302  TROPONINI <0.03   ------------------------------------------------------------------------------------------------------------------ Invalid input(s): POCBNP    Assessment & Plan  Pt is 46 y.o with h/o chronic kidney disease stage III, hyperlipidemia, hypothyroidism and diabetes presenting with generalized weakness 1 Hypotension.   Cortisol level normal Could be related risks related to  Clozapine, I will stop the medication I will start her on some antibiotics orally for a lower extremity wound infection but I do not think this is related to infection Will increase her midrodrine to 10mg  tid  2. CKD stage III. Stable.  3. Hyperlipidemia. Continue Lopid.  4. Hypothyroidism. Continue Synthroid    5. Diabetes. Continue SSI  6. Generalized weakness.  SNF     Code Status Orders        Start     Ordered   01/23/17 1727  Full code  Continuous     01/23/17 1726    Code Status History    Date Active Date Inactive Code Status Order ID Comments User Context   09/27/2016  8:45 AM 10/18/2016  8:43 PM Full Code [id number]  Flora Lipps, MD ED            Consults  71min  DVT Prophylaxis  Lovenox    Lab Results  Component Value Date   PLT 401 01/25/2017     Time Spent in minutes  1min Greater than 50% of time spent in care coordination and counseling patient regarding the condition and plan of care.   Dustin Flock M.D on 01/25/2017 at 1:34 PM  Between 7am to 6pm - Pager - [phone]  After 6pm go to [email or web address] - password [email or web address] [facility]   Office  [phone]

## 2017-01-25 NOTE — Progress Notes (Signed)
PASARR has been received, [id number] E expires on 02/24/2017. Clinical Social Worker (CSW) presented bed offers to patient and she chose [facility]. [facility] liaison is aware of accepted bed offer. Patient's aunt Barbaraann Share is aware of above. Evette [facility] employee is aware of above. CSW will continue to follow and assist as needed.   [employer], LCSW [phone]

## 2017-01-25 NOTE — NC FL2 (Signed)
[place] LEVEL OF CARE SCREENING TOOL     IDENTIFICATION  Patient Name: [name] Birthdate: 1971-02-14 Sex: female Admission Date (Current Location): 01/23/2017  [facility] and [place] Number:  [name]  ([id number]) Facility and Address:  [facility], [address], [place], [name] [address]      Provider Number: [id number]  Attending Physician Name and Address:  Dustin Flock, MD  Relative Name and Phone Number:       Current Level of Care: Hospital Recommended Level of Care: [place]  Prior Approval Number:    Date Approved/Denied:   PASRR Number:   Discharge Plan: Skilled Nursing Facility     Current Diagnoses: Patient Active Problem List   Diagnosis Date Noted  . Hypotension 01/23/2017  . Scalp laceration   . Pressure injury of skin 10/02/2016  . 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 Acute renal failure (ARF) ([name])   . Respiratory failure ([name]) 09/27/2016  . Other sign and symptom in breast     Orientation RESPIRATION BLADDER Height & Weight     Self, Time, Place  Normal Incontinent Weight: 243 lb (110.2 kg) Height:  5\' 6"  (167.6 cm)  BEHAVIORAL SYMPTOMS/MOOD NEUROLOGICAL BOWEL NUTRITION STATUS   (none)  (none) Incontinent Diet (Diet: Heart Healthy )  AMBULATORY STATUS COMMUNICATION OF NEEDS Skin   Extensive Assist  Verbally Normal                       Personal Care Assistance Level of Assistance  Bathing, Feeding, Dressing Bathing Assistance: Limited assistance Feeding assistance: Independent Dressing Assistance: Limited assistance     Functional Limitations Info  Sight, Hearing, Speech Sight Info: Adequate Hearing Info: Adequate Speech Info: Adequate    SPECIAL CARE FACTORS FREQUENCY  PT (By licensed PT) and OT      PT and OT 5 times per week               Contractures      Additional Factors Info  Code Status, Allergies, Isolation Precautions Code  Status Info:  (Full Code. ) Allergies Info:  (Peanut Oil, Risperidone And Related)     Isolation Precautions Info:  (Enteric precautions)     Current Medications (01/25/2017):  This is the current hospital active medication list Current Facility-Administered Medications  Medication Dose Route Frequency Provider Last Rate Last Dose  . 0.9 %  sodium chloride infusion   Intravenous Continuous Dustin Flock, MD 75 mL/hr at 01/24/17 2345    . acetaminophen (TYLENOL) tablet 650 mg  650 mg Oral Q6H PRN Demetrios Loll, MD       Or  . acetaminophen (TYLENOL) suppository 650 mg  650 mg Rectal Q6H PRN Demetrios Loll, MD      . albuterol (PROVENTIL) (2.5 MG/3ML) 0.083% nebulizer solution 2.5 mg  2.5 mg Nebulization Q2H PRN Demetrios Loll, MD      . amoxicillin-clavulanate (AUGMENTIN) 875-125 MG per tablet 1 tablet  1 tablet Oral Q12H Dustin Flock, MD      . bisacodyl (DULCOLAX) EC tablet 5 mg  5 mg Oral Daily PRN Demetrios Loll, MD      . calcium carbonate (TUMS - dosed in mg elemental calcium) chewable tablet 600 mg of elemental calcium  600 mg of elemental calcium Oral BID WC Demetrios Loll, MD   600 mg of elemental calcium at 01/25/17 0857  . cholecalciferol (VITAMIN D) tablet 5,000 Units  5,000 Units Oral Daily Demetrios Loll, MD  5,000 Units at 01/25/17 0855  . cholestyramine light (PREVALITE) packet 4 g  4 g Oral 2 times per day Demetrios Loll, MD   4 g at 01/24/17 2048  . citalopram (CELEXA) tablet 40 mg  40 mg Oral Daily Demetrios Loll, MD   40 mg at 01/25/17 0854  . diphenhydrAMINE (BENADRYL) capsule 50 mg  50 mg Oral QHS PRN Demetrios Loll, MD      . docusate sodium (COLACE) capsule 100 mg  100 mg Oral BID Demetrios Loll, MD   100 mg at 01/25/17 0855  . gemfibrozil (LOPID) tablet 600 mg  600 mg Oral BID Demetrios Loll, MD   600 mg at 01/25/17 0857  . guaiFENesin (MUCINEX) 12 hr tablet 1,200 mg  1,200 mg Oral BID PRN Demetrios Loll, MD       And  . pseudoephedrine (SUDAFED) 12 hr tablet 120 mg  120 mg Oral BID PRN Demetrios Loll, MD      . heparin  injection 5,000 Units  5,000 Units Subcutaneous Q8H Demetrios Loll, MD   5,000 Units at 01/25/17 0547  . levothyroxine (SYNTHROID, LEVOTHROID) tablet 25 mcg  25 mcg Oral Q0600 Demetrios Loll, MD   25 mcg at 01/25/17 0547  . LORazepam (ATIVAN) tablet 0.5 mg  0.5 mg Oral Q4H PRN Demetrios Loll, MD      . LORazepam (ATIVAN) tablet 1 mg  1 mg Oral Beaulah Dinning, MD   1 mg at 01/25/17 0547  . mesalamine (APRISO) 24 hr capsule 1.5 g  1.5 g Oral Daily PRN Demetrios Loll, MD      . mesalamine (ROWASA) enema 4 g  4 g Rectal QHS PRN Demetrios Loll, MD      . midodrine (PROAMATINE) tablet 10 mg  10 mg Oral TID WC Dustin Flock, MD   10 mg at 01/25/17 0856  . ondansetron (ZOFRAN) tablet 4 mg  4 mg Oral Q6H PRN Demetrios Loll, MD       Or  . ondansetron [facility]) injection 4 mg  4 mg Intravenous Q6H PRN Demetrios Loll, MD      . pantoprazole (PROTONIX) EC tablet 40 mg  40 mg Oral Daily Demetrios Loll, MD   40 mg at 01/25/17 0855  . potassium chloride (K-DUR) CR tablet 20 mEq  20 mEq Oral BID Demetrios Loll, MD   20 mEq at 01/25/17 0854  . risperiDONE (RISPERDAL M-TABS) disintegrating tablet 1 mg  1 mg Oral BID Demetrios Loll, MD   1 mg at 01/25/17 0855  . senna-docusate (Senokot-S) tablet 1 tablet  1 tablet Oral QHS PRN Demetrios Loll, MD      . simvastatin (ZOCOR) tablet 20 mg  20 mg Oral QHS Demetrios Loll, MD   20 mg at 01/24/17 2049  . sodium chloride 0.9 % bolus 1,000 mL  1,000 mL Intravenous PRN Dustin Flock, MD      . vitamin B-12 (CYANOCOBALAMIN) tablet 1,000 mcg  1,000 mcg Oral Daily Demetrios Loll, MD   1,000 mcg at 01/25/17 9509     Discharge Medications: Please see discharge summary for a list of discharge medications.  Relevant Imaging Results:  Relevant Lab Results:   Additional Information  (SSN: [id number])  [name], Kiara Beets, LCSW

## 2017-01-25 NOTE — Progress Notes (Signed)
Clinical Education officer, museum (CSW) contacted group home owner Janetta Hora who reported that she came to assess patient and stated that she will not be able to come back to group home level of care and will need a higher level of care. CSW contacted patient's aunt/ HPOA Barbaraann Share and made her aware of above. CSW explained that patient has changed to inpatient today 01/25/17 and can be placed at a SNF for short term rehab under her medicare. CSW explained that she will need a 3 night inpatient qualifying stay in order for medicare to pay for SNF. Aunt is agreeable to SNF search. FL2 complete and faxed out. PASARR is pending. CSW will continue to follow and assist as needed.   [employer], LCSW [phone]

## 2017-01-25 NOTE — Progress Notes (Signed)
PT Cancellation Note  Patient Details Name: [name] MRN: [id number] DOB: 1970/11/06   Cancelled Treatment:    Reason Eval/Treat Not Completed: Medical issues which prohibited therapy.  Pt's BP recently taken and 80/52.  D/t low BP, will hold PT at this time and re-attempt PT treatment at a later date/time as medically appropriate.  [name], PT 01/25/17, 11:08 AM [phone]

## 2017-01-25 NOTE — Clinical Social Work Placement (Signed)
   CLINICAL SOCIAL WORK PLACEMENT  NOTE  Date:  01/25/2017  Patient Details  Name: [name] MRN: [id number] Date of Birth: 13-Feb-1971  Clinical Social Work is seeking post-discharge placement for this patient at the [place] level of care (*CSW will initial, date and re-position this form in  chart as items are completed):  Yes   Patient/family provided with [facility] Work Department's list of facilities offering this level of care within the geographic area requested by the patient (or if unable, by the patient's family).  Yes   Patient/family informed of their freedom to choose among providers that offer the needed level of care, that participate in Medicare, Medicaid or managed care program needed by the patient, have an available bed and are willing to accept the patient.  Yes   Patient/family informed of [facility]'s ownership interest in [facility] and [facility], as well as of the fact that they are under no obligation to receive care at these facilities.  PASRR submitted to EDS on 01/25/17     PASRR number received on       Existing PASRR number confirmed on       FL2 transmitted to all facilities in geographic area requested by pt/family on 01/25/17     FL2 transmitted to all facilities within larger geographic area on       Patient informed that his/her managed care company has contracts with or will negotiate with certain facilities, including the following:            Patient/family informed of bed offers received.  Patient chooses bed at       Physician recommends and patient chooses bed at      Patient to be transferred to   on  .  Patient to be transferred to facility by       Patient family notified on   of transfer.  Name of family member notified:        PHYSICIAN       Additional Comment:    _______________________________________________ Niyati Heinke, Veronia Beets, LCSW 01/25/2017, 11:52 AM

## 2017-01-26 LAB — GLUCOSE, CAPILLARY
GLUCOSE-CAPILLARY: 113 mg/dL — AB (ref 65–99)
Glucose-Capillary: 114 mg/dL — ABNORMAL HIGH (ref 65–99)

## 2017-01-26 LAB — CBC
HEMATOCRIT: 23.8 % — AB (ref 35.0–47.0)
Hemoglobin: 7.8 g/dL — ABNORMAL LOW (ref 12.0–16.0)
MCH: 32.3 pg (ref 26.0–34.0)
MCHC: 32.9 g/dL (ref 32.0–36.0)
MCV: 98.3 fL (ref 80.0–100.0)
Platelets: 379 10*3/uL (ref 150–440)
RBC: 2.42 MIL/uL — ABNORMAL LOW (ref 3.80–5.20)
RDW: 20.7 % — ABNORMAL HIGH (ref 11.5–14.5)
WBC: 10.3 10*3/uL (ref 3.6–11.0)

## 2017-01-26 LAB — BASIC METABOLIC PANEL
Anion gap: 4 — ABNORMAL LOW (ref 5–15)
BUN: 8 mg/dL (ref 6–20)
CALCIUM: 7.7 mg/dL — AB (ref 8.9–10.3)
CHLORIDE: 120 mmol/L — AB (ref 101–111)
CO2: 22 mmol/L (ref 22–32)
CREATININE: 1.04 mg/dL — AB (ref 0.44–1.00)
GFR calc non Af Amer: >60 mL/min (ref >60)
GLUCOSE: 81 mg/dL (ref 65–99)
Potassium: 3.6 mmol/L (ref 3.5–5.1)
Sodium: 146 mmol/L — ABNORMAL HIGH (ref 135–145)

## 2017-01-26 LAB — ECHOCARDIOGRAM COMPLETE
HEIGHTINCHES: 66 in
Weight: 3888 oz

## 2017-01-26 MED ORDER — SODIUM CHLORIDE 0.9 % IV BOLUS (SEPSIS)
500.0000 mL | Freq: Once | INTRAVENOUS | Status: AC
  Administered 2017-01-26: 500 mL via INTRAVENOUS

## 2017-01-26 NOTE — Progress Notes (Signed)
Plan is for patient to D/C to [facility] Saturday 01/28/17 pending medical clearance. MD aware of above. [facility] liaison is aware of above. Clinical Social Worker (CSW) will continue to follow and assist as needed.   [employer], LCSW [phone]

## 2017-01-26 NOTE — Progress Notes (Signed)
Notified Dr Posey Pronto to obtain order for CBG order. Order received to obtain fsbs ACHS

## 2017-01-26 NOTE — Progress Notes (Signed)
PT Cancellation Note  Patient Details Name: [name] MRN: [id number] DOB: [birth year]   Cancelled Treatment:    Reason Eval/Treat Not Completed: Medical issues which prohibited therapy.  Pt's recent BP charted at 87/73.  D/t low BP, will hold PT and re-attempt PT at a later date/time as medically appropriate.  [name], PT 01/26/17, 5:18 PM [phone]

## 2017-01-26 NOTE — Progress Notes (Signed)
[place] [facility]                                                                                                                                                                                  Patient Demographics   [name], is a 46 y.o. female, DOB - 05/26/1971, DHR:[id number]  Admit date - 01/23/2017   Admitting Physician Demetrios Loll, MD  Outpatient Primary MD for the patient is Lorelee Market, MD   LOS - 1  Subjective: Patient had hypotension at 77/41 earlier today. She is asymptomatic.   Review of Systems:   CONSTITUTIONAL: No documented fever. Positive  Fatigue and weakness. No weight gain, no weight loss.  EYES: No blurry or double vision.  ENT: No tinnitus. No postnasal drip. No redness of the oropharynx.  RESPIRATORY: No cough, no wheeze, no hemoptysis. No dyspnea.  CARDIOVASCULAR: No chest pain. No orthopnea. No palpitations. No syncope.  GASTROINTESTINAL: No nausea, no vomiting or diarrhea. No abdominal pain. No melena or hematochezia.  GENITOURINARY: No dysuria or hematuria.  ENDOCRINE: No polyuria or nocturia. No heat or cold intolerance.  HEMATOLOGY: No anemia. No bruising. No bleeding.  INTEGUMENTARY: No rashes. No lesions.  MUSCULOSKELETAL: No arthritis. No swelling. No gout.  NEUROLOGIC: No numbness, tingling, or ataxia. No seizure-type activity.  PSYCHIATRIC: No anxiety. No insomnia. No ADD.    Vitals:   Vitals:   01/26/17 0448 01/26/17 0926 01/26/17 1025 01/26/17 1207  BP: (!) 101/53 (!) 77/41 (!) 93/48 92/69  Pulse: 97 99 92 94  Resp:  18  18  Temp:  98.8 F (37.1 C)  98.2 F (36.8 C)  TempSrc:  Oral  Oral  SpO2:  99%  99%  Weight:      Height:        Wt Readings from Last 3 Encounters:  01/23/17 243 lb (110.2 kg)  10/18/16 255 lb 1.2 oz (115.7 kg)  02/22/16 270 lb (122.5 kg)     Intake/Output Summary (Last 24 hours) at 01/26/17 1612 Last data filed at 01/26/17 1509  Gross per 24 hour  Intake            2202.5 ml  Output             4350 ml  Net          -2147.5 ml    Physical Exam:   GENERAL: Pleasant-appearing in no apparent distress. Morbidly obese. HEAD, EYES, EARS, NOSE AND THROAT: Atraumatic, normocephalic. Extraocular muscles are intact. Pupils equal and reactive to light. Sclerae anicteric. No conjunctival injection. No oro-pharyngeal erythema.  NECK: Supple. There is no jugular venous distention. No bruits, no lymphadenopathy, no thyromegaly.  HEART: Regular rate and rhythm,. No murmurs, no rubs, no clicks.  LUNGS: Clear to auscultation bilaterally. No rales or rhonchi. No wheezes.  ABDOMEN: Soft, flat, nontender, nondistended. Has good bowel sounds. No hepatosplenomegaly appreciated.  EXTREMITIES: No evidence of any cyanosis, clubbing, or 2+ peripheral edema.  +2 pedal and radial pulses bilaterally.  NEUROLOGIC: The patient is alert, awake, and oriented x3 with no focal motor or sensory deficits appreciated bilaterally.  SKIN: She has a wound on the right lower extremity. With some drainage Psych: Not anxious, depressed LN: No inguinal LN enlargement    Antibiotics   Anti-infectives    Start     Dose/Rate Route Frequency Ordered Stop   01/25/17 1200  amoxicillin-clavulanate (AUGMENTIN) 875-125 MG per tablet 1 tablet     1 tablet Oral Every 12 hours 01/25/17 1126     01/23/17 1300  piperacillin-tazobactam (ZOSYN) IVPB 3.375 g     3.375 g 100 mL/hr over 30 Minutes Intravenous  Once 01/23/17 1258 01/23/17 1536   01/23/17 1300  vancomycin (VANCOCIN) IVPB 1000 mg/200 mL premix     1,000 mg 200 mL/hr over 60 Minutes Intravenous  Once 01/23/17 1258 01/23/17 1536      Medications   Scheduled Meds: . amoxicillin-clavulanate  1 tablet Oral Q12H  . calcium carbonate  600 mg of elemental calcium Oral BID WC  . cholecalciferol  5,000 Units Oral Daily  . cholestyramine light  4 g Oral 2 times per day  . citalopram  40 mg Oral Daily  . docusate sodium  100 mg Oral BID  .  gemfibrozil  600 mg Oral BID  . heparin  5,000 Units Subcutaneous Q8H  . levothyroxine  25 mcg Oral Q0600  . LORazepam  1 mg Oral BH-q7a  . midodrine  10 mg Oral TID WC  . pantoprazole  40 mg Oral Daily  . potassium chloride  20 mEq Oral BID  . risperiDONE  1 mg Oral BID  . simvastatin  20 mg Oral QHS  . vitamin B-12  1,000 mcg Oral Daily   Continuous Infusions: . sodium chloride 75 mL/hr at 01/26/17 0544   PRN Meds:.acetaminophen **OR** acetaminophen, albuterol, bisacodyl, diphenhydrAMINE, guaiFENesin **AND** pseudoephedrine, LORazepam, mesalamine, mesalamine, ondansetron **OR** ondansetron (ZOFRAN) IV, senna-docusate, sodium chloride   Data Review:   Micro Results Recent Results (from the past 240 hour(s))  Blood Culture (routine x 2)     Status: None (Preliminary result)   Collection Time: 01/23/17  1:03 PM  Result Value Ref Range Status   Specimen Description BLOOD L FA  Final   Special Requests   Final    BOTTLES DRAWN AEROBIC AND ANAEROBIC Blood Culture adequate volume   Culture NO GROWTH 3 DAYS  Final   Report Status PENDING  Incomplete  Blood Culture (routine x 2)     Status: None (Preliminary result)   Collection Time: 01/23/17  1:03 PM  Result Value Ref Range Status   Specimen Description BLOOD R AC  Final   Special Requests   Final    BOTTLES DRAWN AEROBIC AND ANAEROBIC Blood Culture adequate volume   Culture NO GROWTH 3 DAYS  Final   Report Status PENDING  Incomplete  Urine culture     Status: Abnormal   Collection Time: 01/23/17  3:40 PM  Result Value Ref Range Status   Specimen Description URINE, RANDOM  Final   Special Requests NONE  Final   Culture (A)  Final    <10,000 COLONIES/mL INSIGNIFICANT GROWTH Performed at  [facility], [place] [address], [place], [place] [address]    Report Status 01/24/2017 FINAL  Final  C difficile quick scan w PCR reflex     Status: None   Collection Time: 01/24/17  6:00 PM  Result Value Ref Range Status   C Diff  antigen NEGATIVE NEGATIVE Final   C Diff toxin NEGATIVE NEGATIVE Final   C Diff interpretation No C. difficile detected.  Final    Radiology Reports US Venous Img Lower Bilateral  Result Date: 01/24/2017 CLINICAL DATA:  Bilateral lower extremity swelling. EXAM: BILATERAL LOWER EXTREMITY VENOUS DOPPLER ULTRASOUND TECHNIQUE: Gray-scale sonography with graded compression, as well as color Doppler and duplex ultrasound were performed to evaluate the lower extremity deep venous systems from the level of the common femoral vein and including the common femoral, femoral, profunda femoral, popliteal and calf veins including the posterior tibial, peroneal and gastrocnemius veins when visible. The superficial great saphenous vein was also interrogated. Spectral Doppler was utilized to evaluate flow at rest and with distal augmentation maneuvers in the common femoral, femoral and popliteal veins. COMPARISON:  Ultrasound of July 27, 2012. FINDINGS: RIGHT LOWER EXTREMITY Common Femoral Vein: No evidence of thrombus. Normal compressibility, respiratory phasicity and response to augmentation. Saphenofemoral Junction: No evidence of thrombus. Normal compressibility and flow on color Doppler imaging. Profunda Femoral Vein: No evidence of thrombus. Normal compressibility and flow on color Doppler imaging. Femoral Vein: No evidence of thrombus. Normal compressibility, respiratory phasicity and response to augmentation. Popliteal Vein: No evidence of thrombus. Normal compressibility, respiratory phasicity and response to augmentation. Calf Veins: No evidence of thrombus. Normal compressibility and flow on color Doppler imaging. Superficial Great Saphenous Vein: No evidence of thrombus. Normal compressibility and flow on color Doppler imaging. Venous Reflux:  None. Other Findings:  None. LEFT LOWER EXTREMITY Common Femoral Vein: No evidence of thrombus. Normal compressibility, respiratory phasicity and response to  augmentation. Saphenofemoral Junction: No evidence of thrombus. Normal compressibility and flow on color Doppler imaging. Profunda Femoral Vein: No evidence of thrombus. Normal compressibility and flow on color Doppler imaging. Femoral Vein: No evidence of thrombus. Normal compressibility, respiratory phasicity and response to augmentation. Popliteal Vein: No evidence of thrombus. Normal compressibility, respiratory phasicity and response to augmentation. Calf Veins: No evidence of thrombus. Normal compressibility and flow on color Doppler imaging. Superficial Great Saphenous Vein: No evidence of thrombus. Normal compressibility and flow on color Doppler imaging. Venous Reflux:  None. Other Findings:  None. IMPRESSION: No evidence deep venous thrombosis seen in either lower extremity. Electronically Signed   By: Marijo Conception, M.D.   On: 01/24/2017 12:43   Dg Chest Port 1 View  Result Date: 01/23/2017 CLINICAL DATA:  Weakness. EXAM: PORTABLE CHEST 1 VIEW COMPARISON:  10/02/2016 . FINDINGS: Interim removal of IJ lines. Mediastinum and hilar structures normal. Heart size normal. No focal infiltrate. No pleural effusion or pneumothorax. No acute bony abnormality . IMPRESSION: No acute cardiopulmonary disease. Electronically Signed   By: Marcello Moores  Register   On: 01/23/2017 14:13     CBC  Recent Labs Lab 01/23/17 1302 01/25/17 1145 01/26/17 0415  WBC 9.2 9.9 10.3  HGB 8.7* 7.2* 7.8*  HCT 27.3* 23.0* 23.8*  PLT 481* 401 379  MCV 96.4 99.3 98.3  MCH 30.8 31.2 32.3  MCHC 31.9* 31.4* 32.9  RDW 21.0* 20.2* 20.7*  LYMPHSABS 4.9* 5.9*  --   MONOABS 0.5 0.6  --   EOSABS 0.0 0.1  --   BASOSABS 0.0 0.1  --  Chemistries   Recent Labs Lab 01/23/17 1302 01/23/17 1736 01/24/17 0255 01/26/17 0415  NA 137  --  143 146*  K 3.8  --  3.5 3.6  CL 109  --  116* 120*  CO2 22  --  22 22  GLUCOSE 70  --  107* 81  BUN 9  --  8 8  CREATININE 1.34* 1.18* 1.03* 1.04*  CALCIUM 8.3*  --  7.5* 7.7*    ------------------------------------------------------------------------------------------------------------------ estimated creatinine clearance is 85.9 mL/min (A) (by C-G formula based on SCr of 1.04 mg/dL (H)). ------------------------------------------------------------------------------------------------------------------ No results for input(s): HGBA1C in the last 72 hours. ------------------------------------------------------------------------------------------------------------------ No results for input(s): CHOL, HDL, LDLCALC, TRIG, CHOLHDL, LDLDIRECT in the last 72 hours. ------------------------------------------------------------------------------------------------------------------ No results for input(s): TSH, T4TOTAL, T3FREE, THYROIDAB in the last 72 hours.  Invalid input(s): FREET3 ------------------------------------------------------------------------------------------------------------------  Recent Labs  01/25/17 1145  VITAMINB12 758  FOLATE 3.7*  FERRITIN 467*  TIBC NOT CALCULATED  IRON 28    Coagulation profile No results for input(s): INR, PROTIME in the last 168 hours.  No results for input(s): DDIMER in the last 72 hours.  Cardiac Enzymes  Recent Labs Lab 01/23/17 1302  TROPONINI <0.03   ------------------------------------------------------------------------------------------------------------------ Invalid input(s): POCBNP    Assessment & Plan  Pt is 46 y.o with h/o chronic kidney disease stage III, hyperlipidemia, hypothyroidism and diabetes presenting with generalized weakness  1 Hypotension.   Cortisol level normal Could be related risks related to  Clozapine,Which was discontinued.  On Augmentin for a lower extremity wound infection.  midrodrine was increased to 10mg  tid. Given 500 ml normal saline bolus this morning. Continue normal saline IV.  2. CKD stage III. Stable.  3. Hyperlipidemia. Continue Lopid.  4. Hypothyroidism.  Continue Synthroid    5. Diabetes. Continue SSI  6. Generalized weakness.  SNF  Anemia of chronic disease. Hemoglobin is stable.     Code Status Orders        Start     Ordered   01/23/17 1727  Full code  Continuous     01/23/17 1726    Code Status History    Date Active Date Inactive Code Status Order ID Comments User Context   09/27/2016  8:45 AM 10/18/2016  8:43 PM Full Code [id number]  Flora Lipps, MD ED           Consults  68min  DVT Prophylaxis  Lovenox    Lab Results  Component Value Date   PLT 379 01/26/2017     Time Spent in minutes  55min Greater than 50% of time spent in care coordination and counseling patient regarding the condition and plan of care.   Demetrios Loll M.D on 01/26/2017 at 4:12 PM  Between 7am to 6pm - Pager - [phone]  After 6pm go to [email or web address] - password [email or web address] [facility]   Office  [phone]

## 2017-01-26 NOTE — Progress Notes (Signed)
Patient BP 77/44 MD aware and will order bolus and continue midodrine.

## 2017-01-27 LAB — GLUCOSE, CAPILLARY
Glucose-Capillary: 72 mg/dL (ref 65–99)
Glucose-Capillary: 96 mg/dL (ref 65–99)
Glucose-Capillary: 88 mg/dL (ref 65–99)
Glucose-Capillary: 91 mg/dL (ref 65–99)

## 2017-01-27 LAB — HEMOGLOBIN: HEMOGLOBIN: 8.2 g/dL — AB (ref 12.0–16.0)

## 2017-01-27 MED ORDER — HYDROCORTISONE 1 % EX CREA
TOPICAL_CREAM | CUTANEOUS | Status: DC | PRN
  Administered 2017-01-27 (×2): via TOPICAL
  Filled 2017-01-27: qty 28

## 2017-01-27 NOTE — Care Management Important Message (Signed)
Important Message  Patient Details  Name: [name] MRN: [id number] Date of Birth: [birth year]   Medicare Important Message Given:  Yes    Jolly Mango, RN 01/27/2017, 10:57 AM

## 2017-01-27 NOTE — Care Management (Signed)
[facility] made ware of anticipated discharge tomorrow.

## 2017-01-27 NOTE — Progress Notes (Signed)
[place] [facility]                                                                                                                                                                                  Patient Demographics   [name], is a 46 y.o. female, DOB - 09-Aug-1971, QIH:[id number]  Admit date - 01/23/2017   Admitting Physician Demetrios Loll, MD  Outpatient Primary MD for the patient is Lorelee Market, MD   LOS - 2  Subjective: Patient has no complaint. Blood pressure was 83/52 and is better but is still in low side.   Review of Systems:   CONSTITUTIONAL: No documented fever. Positive  Fatigue and weakness. No weight gain, no weight loss.  EYES: No blurry or double vision.  ENT: No tinnitus. No postnasal drip. No redness of the oropharynx.  RESPIRATORY: No cough, no wheeze, no hemoptysis. No dyspnea.  CARDIOVASCULAR: No chest pain. No orthopnea. No palpitations. No syncope.  GASTROINTESTINAL: No nausea, no vomiting or diarrhea. No abdominal pain. No melena or hematochezia.  GENITOURINARY: No dysuria or hematuria.  ENDOCRINE: No polyuria or nocturia. No heat or cold intolerance.  HEMATOLOGY: No anemia. No bruising. No bleeding.  INTEGUMENTARY: No rashes. No lesions.  MUSCULOSKELETAL: No arthritis. No swelling. No gout.  NEUROLOGIC: No numbness, tingling, or ataxia. No seizure-type activity.  PSYCHIATRIC: No anxiety. No insomnia. No ADD.    Vitals:   Vitals:   01/26/17 1655 01/27/17 0008 01/27/17 0730 01/27/17 1345  BP: (!) 87/73 (!) 86/45 (!) 83/52 (!) 100/56  Pulse: 93 96 99 94  Resp: 18 18    Temp: 97.6 F (36.4 C) 97.6 F (36.4 C) 98.6 F (37 C)   TempSrc: Oral Oral Oral   SpO2: 98% 98% 97% 98%  Weight:      Height:        Wt Readings from Last 3 Encounters:  01/23/17 243 lb (110.2 kg)  10/18/16 255 lb 1.2 oz (115.7 kg)  02/22/16 270 lb (122.5 kg)     Intake/Output Summary (Last 24 hours) at 01/27/17 1509 Last data filed  at 01/27/17 1336  Gross per 24 hour  Intake           2672.5 ml  Output             4600 ml  Net          -1927.5 ml    Physical Exam:   GENERAL: Pleasant-appearing in no apparent distress. Morbidly obese. HEAD, EYES, EARS, NOSE AND THROAT: Atraumatic, normocephalic. Extraocular muscles are intact. Pupils equal and reactive to light. Sclerae anicteric. No conjunctival injection. No oro-pharyngeal erythema.  NECK: Supple. There  is no jugular venous distention. No bruits, no lymphadenopathy, no thyromegaly.  HEART: Regular rate and rhythm,. No murmurs, no rubs, no clicks.  LUNGS: Clear to auscultation bilaterally. No rales or rhonchi. No wheezes.  ABDOMEN: Soft, flat, nontender, nondistended. Has good bowel sounds. No hepatosplenomegaly appreciated.  EXTREMITIES: No evidence of any cyanosis, clubbing, or 2+ peripheral edema.  +2 pedal and radial pulses bilaterally.  NEUROLOGIC: The patient is alert, awake, and oriented x3 with no focal motor or sensory deficits appreciated bilaterally.  SKIN: She has a wound on the right lower extremity in dressing. Psych: Not anxious, depressed LN: No inguinal LN enlargement    Antibiotics   Anti-infectives    Start     Dose/Rate Route Frequency Ordered Stop   01/25/17 1200  amoxicillin-clavulanate (AUGMENTIN) 875-125 MG per tablet 1 tablet     1 tablet Oral Every 12 hours 01/25/17 1126     01/23/17 1300  piperacillin-tazobactam (ZOSYN) IVPB 3.375 g     3.375 g 100 mL/hr over 30 Minutes Intravenous  Once 01/23/17 1258 01/23/17 1536   01/23/17 1300  vancomycin (VANCOCIN) IVPB 1000 mg/200 mL premix     1,000 mg 200 mL/hr over 60 Minutes Intravenous  Once 01/23/17 1258 01/23/17 1536      Medications   Scheduled Meds: . amoxicillin-clavulanate  1 tablet Oral Q12H  . calcium carbonate  600 mg of elemental calcium Oral BID WC  . cholecalciferol  5,000 Units Oral Daily  . cholestyramine light  4 g Oral 2 times per day  . citalopram  40 mg Oral  Daily  . docusate sodium  100 mg Oral BID  . gemfibrozil  600 mg Oral BID  . heparin  5,000 Units Subcutaneous Q8H  . levothyroxine  25 mcg Oral Q0600  . LORazepam  1 mg Oral BH-q7a  . midodrine  10 mg Oral TID WC  . pantoprazole  40 mg Oral Daily  . potassium chloride  20 mEq Oral BID  . risperiDONE  1 mg Oral BID  . simvastatin  20 mg Oral QHS  . vitamin B-12  1,000 mcg Oral Daily   Continuous Infusions: . sodium chloride 75 mL/hr at 01/27/17 0238   PRN Meds:.acetaminophen **OR** acetaminophen, albuterol, bisacodyl, diphenhydrAMINE, guaiFENesin **AND** pseudoephedrine, hydrocortisone cream, LORazepam, mesalamine, mesalamine, ondansetron **OR** ondansetron (ZOFRAN) IV, senna-docusate, sodium chloride   Data Review:   Micro Results Recent Results (from the past 240 hour(s))  Blood Culture (routine x 2)     Status: None (Preliminary result)   Collection Time: 01/23/17  1:03 PM  Result Value Ref Range Status   Specimen Description BLOOD L FA  Final   Special Requests   Final    BOTTLES DRAWN AEROBIC AND ANAEROBIC Blood Culture adequate volume   Culture NO GROWTH 4 DAYS  Final   Report Status PENDING  Incomplete  Blood Culture (routine x 2)     Status: None (Preliminary result)   Collection Time: 01/23/17  1:03 PM  Result Value Ref Range Status   Specimen Description BLOOD R AC  Final   Special Requests   Final    BOTTLES DRAWN AEROBIC AND ANAEROBIC Blood Culture adequate volume   Culture NO GROWTH 4 DAYS  Final   Report Status PENDING  Incomplete  Urine culture     Status: Abnormal   Collection Time: 01/23/17  3:40 PM  Result Value Ref Range Status   Specimen Description URINE, RANDOM  Final   Special Requests NONE  Final  Culture (A)  Final    <10,000 COLONIES/mL INSIGNIFICANT GROWTH Performed at [facility] [address] [place], [place] [address]    Report Status 01/24/2017 FINAL  Final  C difficile quick scan w PCR reflex     Status: None   Collection  Time: 01/24/17  6:00 PM  Result Value Ref Range Status   C Diff antigen NEGATIVE NEGATIVE Final   C Diff toxin NEGATIVE NEGATIVE Final   C Diff interpretation No C. difficile detected.  Final    Radiology Reports US Venous Img Lower Bilateral  Result Date: 01/24/2017 CLINICAL DATA:  Bilateral lower extremity swelling. EXAM: BILATERAL LOWER EXTREMITY VENOUS DOPPLER ULTRASOUND TECHNIQUE: Gray-scale sonography with graded compression, as well as color Doppler and duplex ultrasound were performed to evaluate the lower extremity deep venous systems from the level of the common femoral vein and including the common femoral, femoral, profunda femoral, popliteal and calf veins including the posterior tibial, peroneal and gastrocnemius veins when visible. The superficial great saphenous vein was also interrogated. Spectral Doppler was utilized to evaluate flow at rest and with distal augmentation maneuvers in the common femoral, femoral and popliteal veins. COMPARISON:  Ultrasound of July 27, 2012. FINDINGS: RIGHT LOWER EXTREMITY Common Femoral Vein: No evidence of thrombus. Normal compressibility, respiratory phasicity and response to augmentation. Saphenofemoral Junction: No evidence of thrombus. Normal compressibility and flow on color Doppler imaging. Profunda Femoral Vein: No evidence of thrombus. Normal compressibility and flow on color Doppler imaging. Femoral Vein: No evidence of thrombus. Normal compressibility, respiratory phasicity and response to augmentation. Popliteal Vein: No evidence of thrombus. Normal compressibility, respiratory phasicity and response to augmentation. Calf Veins: No evidence of thrombus. Normal compressibility and flow on color Doppler imaging. Superficial Great Saphenous Vein: No evidence of thrombus. Normal compressibility and flow on color Doppler imaging. Venous Reflux:  None. Other Findings:  None. LEFT LOWER EXTREMITY Common Femoral Vein: No evidence of thrombus.  Normal compressibility, respiratory phasicity and response to augmentation. Saphenofemoral Junction: No evidence of thrombus. Normal compressibility and flow on color Doppler imaging. Profunda Femoral Vein: No evidence of thrombus. Normal compressibility and flow on color Doppler imaging. Femoral Vein: No evidence of thrombus. Normal compressibility, respiratory phasicity and response to augmentation. Popliteal Vein: No evidence of thrombus. Normal compressibility, respiratory phasicity and response to augmentation. Calf Veins: No evidence of thrombus. Normal compressibility and flow on color Doppler imaging. Superficial Great Saphenous Vein: No evidence of thrombus. Normal compressibility and flow on color Doppler imaging. Venous Reflux:  None. Other Findings:  None. IMPRESSION: No evidence deep venous thrombosis seen in either lower extremity. Electronically Signed   By: Marijo Conception, M.D.   On: 01/24/2017 12:43   Dg Chest Port 1 View  Result Date: 01/23/2017 CLINICAL DATA:  Weakness. EXAM: PORTABLE CHEST 1 VIEW COMPARISON:  10/02/2016 . FINDINGS: Interim removal of IJ lines. Mediastinum and hilar structures normal. Heart size normal. No focal infiltrate. No pleural effusion or pneumothorax. No acute bony abnormality . IMPRESSION: No acute cardiopulmonary disease. Electronically Signed   By: Marcello Moores  Register   On: 01/23/2017 14:13     CBC  Recent Labs Lab 01/23/17 1302 01/25/17 1145 01/26/17 0415 01/27/17 0754  WBC 9.2 9.9 10.3  --   HGB 8.7* 7.2* 7.8* 8.2*  HCT 27.3* 23.0* 23.8*  --   PLT 481* 401 379  --   MCV 96.4 99.3 98.3  --   MCH 30.8 31.2 32.3  --   MCHC 31.9* 31.4* 32.9  --  RDW 21.0* 20.2* 20.7*  --   LYMPHSABS 4.9* 5.9*  --   --   MONOABS 0.5 0.6  --   --   EOSABS 0.0 0.1  --   --   BASOSABS 0.0 0.1  --   --     Chemistries   Recent Labs Lab 01/23/17 1302 01/23/17 1736 01/24/17 0255 01/26/17 0415  NA 137  --  143 146*  K 3.8  --  3.5 3.6  CL 109  --  116* 120*   CO2 22  --  22 22  GLUCOSE 70  --  107* 81  BUN 9  --  8 8  CREATININE 1.34* 1.18* 1.03* 1.04*  CALCIUM 8.3*  --  7.5* 7.7*   ------------------------------------------------------------------------------------------------------------------ estimated creatinine clearance is 85.9 mL/min (A) (by C-G formula based on SCr of 1.04 mg/dL (H)). ------------------------------------------------------------------------------------------------------------------ No results for input(s): HGBA1C in the last 72 hours. ------------------------------------------------------------------------------------------------------------------ No results for input(s): CHOL, HDL, LDLCALC, TRIG, CHOLHDL, LDLDIRECT in the last 72 hours. ------------------------------------------------------------------------------------------------------------------ No results for input(s): TSH, T4TOTAL, T3FREE, THYROIDAB in the last 72 hours.  Invalid input(s): FREET3 ------------------------------------------------------------------------------------------------------------------  Recent Labs  01/25/17 1145  VITAMINB12 758  FOLATE 3.7*  FERRITIN 467*  TIBC NOT CALCULATED  IRON 28    Coagulation profile No results for input(s): INR, PROTIME in the last 168 hours.  No results for input(s): DDIMER in the last 72 hours.  Cardiac Enzymes  Recent Labs Lab 01/23/17 1302  TROPONINI <0.03   ------------------------------------------------------------------------------------------------------------------ Invalid input(s): POCBNP    Assessment & Plan  Pt is 46 y.o with h/o chronic kidney disease stage III, hyperlipidemia, hypothyroidism and diabetes presenting with generalized weakness  1 Hypotension.   Cortisol level normal Could be related risks related to  Clozapine,Which was discontinued.  On Augmentin for a lower extremity wound infection.  midrodrine was increased to 10mg  tid. Continue normal saline IV.  2. CKD  stage III. Stable.  3. Hyperlipidemia. Continue Lopid.  4. Hypothyroidism. Continue Synthroid    5. Diabetes. Continue SSI  6. Generalized weakness.  SNF  Anemia of chronic disease. Hemoglobin is stable. Hb 8.2.     Code Status Orders        Start     Ordered   01/23/17 1727  Full code  Continuous     01/23/17 1726    Code Status History    Date Active Date Inactive Code Status Order ID Comments User Context   09/27/2016  8:45 AM 10/18/2016  8:43 PM Full Code [id number]  Flora Lipps, MD ED      DVT Prophylaxis  Lovenox    Lab Results  Component Value Date   PLT 379 01/26/2017     Time Spent in minutes  25 min Greater than 50% of time spent in care coordination and counseling patient regarding the condition and plan of care. Possible discharge to skilled nursing facility tomorrow.   Demetrios Loll M.D on 01/27/2017 at 3:09 PM  Between 7am to 6pm - Pager - [phone]  After 6pm go to [email or web address] - password [email or web address] [facility]   Office  [phone]

## 2017-01-27 NOTE — Progress Notes (Signed)
Plan is for patient to D/C to [facility] over the weekend if medically stable. Per [facility] liaison patient can come to room 610, RN will call report to [address] nurse. Patient is aware of above and in agreement with plan. Clinical Education officer, museum (CSW) contacted patient's aunt/ HPOA Barbaraann Share and made her aware of above. CSW will continue to follow and assist as needed.   [employer], LCSW [phone]

## 2017-01-27 NOTE — Plan of Care (Signed)
Problem: Physical Regulation: Goal: Ability to maintain clinical measurements within normal limits will improve Outcome: Progressing Patient BP still low, but progressively getting better.  Deri Fuelling

## 2017-01-27 NOTE — Progress Notes (Signed)
Patient alert and oriented. Patient has rash, itching, and abrasions from scratches on his buttocks. RN applied hydrocortisone cream and pick foam to buttocks. RN also has MASD between folds on her thighs, barrier cream applied. Patient got up to recliner today with 2 assist and walker.   Deri Fuelling, RN

## 2017-01-27 NOTE — Progress Notes (Signed)
Physical Therapy Treatment Patient Details Name: MORGIN HALLS MRN: [id number] DOB: 25-Feb-1971 Today's Date: 01/27/2017    History of Present Illness Pt is a 46 y.o. F admitted on 01/23/17 for weakness and hypotension. Prior to admission pt has been on antibiotics for cellulitis and UTI with concern for sepsis. Pt is currently being treated for a wound on her L LE and a sacral wound. Pt PMH includes Crohn's disease, depression, DM, HLD, HTN, hypothyroidism, R eye mass, schizophrenia, TBI, and ARF.    PT Comments    Pt sleeping upon entry into pt room. Pt woken and agreeable to PT. Pt reported lower abdominal pain at beginning of session and RN notified and pain monitored during session. Pt demonstrated minimal UE weakness and increased LE weakness from last session. Pt fatigued easily with LE bed level exercises, pt required rest breaks d/t SOB between exercises. Pt required min assist for rolling in bed, max assist to transfer from sidelying to sitting, and min to CGA for sit to stand and transfers. Pt required multiple rest breaks d/t fatigue and SOB during session. Pt would benefit from skilled PT during admission to increase strength, increase activity tolerance, and begin ambulation with RW. Current recommendations remain appropriate d/t pt's limited activity tolerance and weakness.   Follow Up Recommendations  SNF     Equipment Recommendations  Rolling walker with 5" wheels    Recommendations for Other Services       Precautions / Restrictions Precautions Precautions: Fall Restrictions Weight Bearing Restrictions: No    Mobility  Bed Mobility Overal bed mobility: Needs Assistance Bed Mobility: Sidelying to Sit;Rolling Rolling: Min assist Sidelying to sit: Max assist       General bed mobility comments: Pt given v/c's to reach for bed rail to roll to sidelying before sitting up; pt requiered a rest break after first attempt to sit up from sidelying due to SOB and fatigue;  pt required max assist to sit up from sidelying and pt SOB in sitting.  Transfers Overall transfer level: Needs assistance Equipment used: Rolling walker (2 wheeled) Transfers: Sit to/from [facility] Sit to Stand: Min assist Stand pivot transfers: Min guard       General transfer comment: Pt stood from EOB and transfered to the chair.  Ambulation/Gait             General Gait Details: Not appropriate to asses at this time d/t pt's decreased activity tolerance and fatigue.   Stairs            Wheelchair Mobility    Modified Rankin (Stroke Patients Only)       Balance Overall balance assessment: Needs assistance Sitting-balance support: Single extremity supported;Feet unsupported Sitting balance-Leahy Scale: Good Sitting balance - Comments: Pt able to maintain sitting balance with CGA for safety and performed LE MMTs in sitting.   Standing balance support: Bilateral upper extremity supported Standing balance-Leahy Scale: Fair                              Cognition Arousal/Alertness: Awake/alert Behavior During Therapy: WFL for tasks assessed/performed Overall Cognitive Status: Within Functional Limits for tasks assessed                                        Exercises General Exercises - Lower Extremity Ankle Circles/Pumps: AROM;Both;10 reps;Supine Short Arc  Quad: AROM;Both;10 reps;Supine Heel Slides: AROM;Both;10 reps;Supine (Supported pt's heel to prevent sliding against bed sheet.) Hip ABduction/ADduction: AROM;Both;10 reps;Supine (Supported pt's heel to prevent sliding against bed sheet.) Straight Leg Raises: AROM;Both;10 reps;Supine    General Comments  UE MMTs assessed and pt 4+/5 for shoulder flexion and elbow flexion/extension; LE MMTs assessed and pt performed 4-/5 for hip flexion and knee flexion/extension      Pertinent Vitals/Pain Pain Assessment: 0-10 (Pt reported having "colon pain" during PT  session (RN notified) ) Pain Score: 7  Pain Location: L lower abdomen Pain Descriptors / Indicators: Discomfort;Grimacing;Guarding Pain Intervention(s): Limited activity within patient's tolerance;Monitored during session;Patient requesting pain meds-RN notified  Monitored pt's BP throughout session: Supine: 100/56; Sitting EOB: 102/56; Sitting in chair after stand pivot transfer: 105/60; Sitting in chair after rest: 91/51(Nursing notified)     Home Living                      Prior Function            PT Goals (current goals can now be found in the care plan section) Acute Rehab PT Goals Patient Stated Goal: To sit up in the chair and walk. PT Goal Formulation: With patient Time For Goal Achievement: 02/07/17 Potential to Achieve Goals: Good Progress towards PT goals: Progressing toward goals    Frequency    Min 2X/week      PT Plan Current plan remains appropriate    Co-evaluation             End of Session Equipment Utilized During Treatment: Gait belt Activity Tolerance: Patient limited by fatigue;No increased pain Patient left: in chair;with call bell/phone within reach;with chair alarm set (SCD's left off per nursing stating pt's legs are tender.) Nurse Communication: Mobility status;Patient requests pain meds; notified of BP at end on session PT Visit Diagnosis: Unsteadiness on feet (R26.81);Muscle weakness (generalized) (M62.81);History of falling (Z91.81)     Time: 4008-6761 PT Time Calculation (min) (ACUTE ONLY): 53 min  Charges:                       G Codes:         Alyssha Housh, SPT 01/27/2017, 3:07 PM

## 2017-01-27 NOTE — Plan of Care (Signed)
Problem: Skin Integrity: Goal: Risk for impaired skin integrity will decrease Outcome: Progressing Patient has MASD (Moisture Associated Skin Damage) sacral/buttocks area. Barrier cream applied.

## 2017-01-28 LAB — CULTURE, BLOOD (ROUTINE X 2)
CULTURE: NO GROWTH
Culture: NO GROWTH
SPECIAL REQUESTS: ADEQUATE
Special Requests: ADEQUATE

## 2017-01-28 LAB — GLUCOSE, CAPILLARY
Glucose-Capillary: 67 mg/dL (ref 65–99)
Glucose-Capillary: 81 mg/dL (ref 65–99)
Glucose-Capillary: 86 mg/dL (ref 65–99)

## 2017-01-28 MED ORDER — BISACODYL 5 MG PO TBEC: 5.0000 mg | DELAYED_RELEASE_TABLET | Freq: Every day | ORAL | 0 refills | Status: DC | PRN

## 2017-01-28 MED ORDER — LORAZEPAM 0.5 MG PO TABS: 0.5000 mg | ORAL_TABLET | ORAL | 0 refills | Status: DC | PRN

## 2017-01-28 MED ORDER — METOCLOPRAMIDE HCL 5 MG/ML IJ SOLN
10.0000 mg | Freq: Once | INTRAMUSCULAR | Status: AC
  Administered 2017-01-28: 10 mg via INTRAVENOUS
  Filled 2017-01-28: qty 2

## 2017-01-28 MED ORDER — MIDODRINE HCL 10 MG PO TABS: 10.0000 mg | ORAL_TABLET | Freq: Three times a day (TID) | ORAL | Status: DC

## 2017-01-28 MED ORDER — LORAZEPAM 1 MG PO TABS: 1.0000 mg | ORAL_TABLET | ORAL | 0 refills | Status: DC

## 2017-01-28 NOTE — Clinical Social Work Note (Signed)
Patient will DC to [facility] today via non-emergent EMS. The facility and her guardian [name] are aware of the dc and are in agreement. CSW will con't to follow pending additional dc needs.  Santiago Bumpers, MSW, Latanya Presser [phone]

## 2017-01-28 NOTE — Progress Notes (Signed)
Patient BP is better, states she feels ok, no nausea or dizziness. Dr Bridgett Larsson notified of BP and states to continue with discharge plans. EMS called.

## 2017-01-28 NOTE — Progress Notes (Signed)
Pt c/o nausea, PRN Zofran was given at 2019. Paged and spoke to Dr. Almyra Free, ordered for one time dose of Reglan 10mg  IV. Will administer as ordered and continue to monitor.

## 2017-01-28 NOTE — Progress Notes (Signed)
EMS arrived, stated patient was nauseated. I came to check on patient and raise head of bed, patient vomited 900 cc of bile colored emesis. BP low, HR 93. After vomiting patient stated she felt much better. Dr Bridgett Larsson paged about BP. MD states to recheck BP in 30 minutes and call back with result. I told EMS I would call them back later if patient is stable and still is being discharged.

## 2017-01-28 NOTE — Discharge Summary (Signed)
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 Abnormal mammogram, unspecified 2013   Prev. cytology,hypercellular smears without evidence of malignant cells. The cytopathologist questioned if samples truly representative. Care taken during sampling and is felt to be representative.  . Breast screening, unspecified 2013  . Broken leg    age 46  . Cellulitis   . Crohn's disease ([place]) 2013  . Depression   . Diabetes mellitus without complication (HCC)    non insulin dependent  . Early menopause   . Edema   . GERD (gastroesophageal reflux disease)   . Hiatal hernia 2013  . Hyperlipidemia   . Hypertension   . Hypothyroidism   . 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 Obesity, unspecified 2013  . Other sign and symptom in breast 2013   Right bst US,lower outer quadrant,A single 0.3x0.4x0.6cm hypoechoic mass with slightly lobulated borders with adjacent 0.3x0.4x0.5cm mass was noted. The 1st was 5cm from nipple, 2nd at 8 cm from the nipple. The previous lesion aspirate was at 3 0'clock position. These lesions are thought to account for the mammographic abnormality. Minimal interval change on [place].   [facility])   . Rib fracture    age 46  . Schizophrenia ([place])   . TBI (traumatic brain injury) ([place])   . Thyroid disease    hypothyroid  . Vitamin D deficiency    HOSPITAL COURSE:   Pt is 46 y.o with h/o chronic kidney disease stage III, hyperlipidemia, hypothyroidism and diabetes presenting with generalized weakness  1 Hypotension.     Cortisol level normal Could be related risks related to  Clozapine,Which was discontinued.  She is treated with Augmentin for a lower extremity wound infection.  midrodrine was increased to 10mg  tid. BP is better but in low side, the patient has no symptom of dizziness,  looks like her baseline, discontinue normal saline IV.  2. CKDstage III. Stable.  3. Hyperlipidemia. Continue Lopid.  4. Hypothyroidism. Continue Synthroid    5. Diabetes. Continue SSI  6. Generalized weakness.  SNF  Anemia of chronic disease. Hemoglobin is stable. Hb 8.2.  DISCHARGE CONDITIONS:  Stable, discharge to SNF today. CONSULTS OBTAINED:   DRUG ALLERGIES:   Allergies  Allergen Reactions  . Peanut Oil Other (See Comments)    Face turns red  . Risperidone And Related Cough   DISCHARGE MEDICATIONS:   Allergies as of 01/28/2017      Reactions   Peanut Oil Other (See Comments)   Face turns red   Risperidone And Related Cough      Medication List    STOP taking these medications   cloZAPine 100 MG tablet Commonly known as:  CLOZARIL   predniSONE 10 MG tablet Commonly known as:  DELTASONE     TAKE these medications   acetaminophen 325 MG tablet Commonly known as:  TYLENOL Take 650 mg by mouth every 6 (six) hours as needed for mild pain. Every 4 to 6 hours PRN   albuterol 108 (90 Base) MCG/ACT inhaler Commonly known as:  PROVENTIL HFA;VENTOLIN HFA Inhale 1 puff into the lungs 4 (four) times daily as needed for wheezing or shortness of breath.   bisacodyl 5 MG EC tablet Commonly known as:  DULCOLAX Take 1 tablet (5 mg total) by mouth daily as needed for moderate constipation.   calcium carbonate 1500 (600 Ca) MG Tabs tablet Commonly known as:  OSCAL Take 600 mg of elemental calcium by mouth 2 (two) times daily with a meal.   Cholecalciferol 5000 units Tabs Take 5,000 Units by mouth daily.   cholestyramine light 4 g packet Commonly known as:  PREVALITE Take 1 packet (4 g  total) by mouth 2 (two) times daily.   citalopram 40 MG tablet Commonly known as:  CELEXA Take 40 mg by mouth daily.   diphenhydrAMINE 50 MG capsule Commonly known as:  BENADRYL Take 50 mg by mouth at bedtime.   docusate sodium 100 MG capsule Commonly known as:  COLACE Take 100 mg by mouth 2 (two) times daily.   gemfibrozil 600 MG tablet Commonly known as:  LOPID Take 600 mg by mouth 2 (two) times daily.   hyoscyamine 0.125 MG Tbdp disintergrating tablet Commonly known as:  ANASPAZ Place 0.125 mg under the tongue every 6 (six) hours as needed.   levothyroxine 25 MCG tablet Commonly known as:  SYNTHROID, LEVOTHROID Take 25 mcg by mouth every morning.   LORazepam 1 MG tablet Commonly known as:  ATIVAN Take 1 tablet (1 mg total) by mouth every morning.   LORazepam 0.5 MG tablet Commonly known as:  ATIVAN Take 1 tablet (0.5 mg total) by mouth every 4 (four) hours as needed for anxiety.   mesalamine 0.375 g 24 hr capsule Commonly known as:  APRISO Take 1,500 mg by mouth every morning.   mesalamine 4 g enema Commonly known as:  ROWASA Place 60 mLs (4 g total) rectally at bedtime.   midodrine 10 MG tablet Commonly known as:  PROAMATINE Take 1 tablet (10 mg total) by mouth 3 (three) times daily with meals.   omega-3 acid ethyl esters 1 g capsule Commonly known as:  LOVAZA Take 2 g by mouth every morning.   ondansetron 4 MG tablet Commonly known as:  ZOFRAN Take 4 mg by mouth every 6 (six) hours as needed for nausea or vomiting.   pantoprazole 40 MG tablet Commonly known as:  PROTONIX Take 40 mg by mouth daily.   potassium chloride 10 MEQ tablet Commonly known as:  K-DUR Take 20 mEq by mouth 2 (two) times daily.   pseudoephedrine-guaifenesin 60-600 MG 12 hr tablet Commonly known as:  MUCINEX D Take 2 tablets by mouth every 12 (twelve) hours as needed for congestion.   RISPERDAL M-TAB 1 MG disintegrating tablet Generic drug:  risperiDONE Take 1 mg by mouth 2  (two) times daily.   simvastatin 20 MG tablet Commonly known as:  ZOCOR Take 20 mg by mouth at bedtime.   vitamin B-12 1000 MCG tablet Commonly known as:  CYANOCOBALAMIN Take 1,000 mcg by mouth daily.        DISCHARGE INSTRUCTIONS:  See AVS.  If you experience worsening of your admission symptoms, develop shortness of breath, life threatening emergency, suicidal or homicidal thoughts you must seek medical attention immediately by calling 911 or calling your MD immediately  if symptoms less severe.  You Must read complete instructions/literature along with all the possible adverse reactions/side effects for all the Medicines you take and that have been prescribed to you. Take any new Medicines after  you have completely understood and accpet all the possible adverse reactions/side effects.   Please note  You were cared for by a hospitalist during your hospital stay. If you have any questions about your discharge medications or the care you received while you were in the hospital after you are discharged, you can call the unit and asked to speak with the hospitalist on call if the hospitalist that took care of you is not available. Once you are discharged, your primary care physician will handle any further medical issues. Please note that NO REFILLS for any discharge medications will be authorized once you are discharged, as it is imperative that you return to your primary care physician (or establish a relationship with a primary care physician if you do not have one) for your aftercare needs so that they can reassess your need for medications and monitor your lab values.    On the day of Discharge:  VITAL SIGNS:  Blood pressure (!) 91/45, pulse 93, temperature 98.1 F (36.7 C), temperature source Oral, resp. rate 17, height 5\' 6"  (1.676 m), weight 243 lb (110.2 kg), SpO2 95 %. PHYSICAL EXAMINATION:  GENERAL:  46 y.o.-year-old patient lying in the bed with no acute distress.  obese. EYES: Pupils equal, round, reactive to light and accommodation. No scleral icterus. Extraocular muscles intact.  HEENT: Head atraumatic, normocephalic. Oropharynx and nasopharynx clear.  NECK:  Supple, no jugular venous distention. No thyroid enlargement, no tenderness.  LUNGS: Normal breath sounds bilaterally, no wheezing, rales,rhonchi or crepitation. No use of accessory muscles of respiration.  CARDIOVASCULAR: S1, S2 normal. No murmurs, rubs, or gallops.  ABDOMEN: Soft, non-tender, non-distended. Bowel sounds present. No organomegaly or mass.  EXTREMITIES: leg edema, no cyanosis, or clubbing. Right leg wound in dressing. NEUROLOGIC: Cranial nerves II through XII are intact. Muscle strength 5/5 in all extremities. Sensation intact. Gait not checked.  PSYCHIATRIC: The patient is alert and oriented x 3.  SKIN: No obvious rash, lesion, or ulcer.  DATA REVIEW:   CBC  Recent Labs Lab 01/26/17 0415 01/27/17 0754  WBC 10.3  --   HGB 7.8* 8.2*  HCT 23.8*  --   PLT 379  --     Chemistries   Recent Labs Lab 01/26/17 0415  NA 146*  K 3.6  CL 120*  CO2 22  GLUCOSE 81  BUN 8  CREATININE 1.04*  CALCIUM 7.7*     Microbiology Results  Results for orders placed or performed during the hospital encounter of 01/23/17  Blood Culture (routine x 2)     Status: None (Preliminary result)   Collection Time: 01/23/17  1:03 PM  Result Value Ref Range Status   Specimen Description BLOOD L FA  Final   Special Requests   Final    BOTTLES DRAWN AEROBIC AND ANAEROBIC Blood Culture adequate volume   Culture NO GROWTH 4 DAYS  Final   Report Status PENDING  Incomplete  Blood Culture (routine x 2)     Status: None (Preliminary result)   Collection Time: 01/23/17  1:03 PM  Result Value Ref Range Status   Specimen Description BLOOD R AC  Final   Special Requests   Final    BOTTLES DRAWN AEROBIC AND ANAEROBIC Blood Culture adequate volume   Culture NO GROWTH 4 DAYS  Final   Report Status  PENDING  Incomplete  Urine culture     Status: Abnormal   Collection Time: 01/23/17  3:40 PM  Result Value Ref Range Status   Specimen  Description URINE, RANDOM  Final   Special Requests NONE  Final   Culture (A)  Final    <10,000 COLONIES/mL INSIGNIFICANT GROWTH Performed at [facility], [place] [address], [place], [place] [address]    Report Status 01/24/2017 FINAL  Final  C difficile quick scan w PCR reflex     Status: None   Collection Time: 01/24/17  6:00 PM  Result Value Ref Range Status   C Diff antigen NEGATIVE NEGATIVE Final   C Diff toxin NEGATIVE NEGATIVE Final   C Diff interpretation No C. difficile detected.  Final    RADIOLOGY:  No results found.   Management plans discussed with the patient, family and they are in agreement.  CODE STATUS: Full Code   TOTAL TIME TAKING CARE OF THIS PATIENT: 33 minutes.    Demetrios Loll M.D on 01/28/2017 at 8:34 AM  Between 7am to 6pm - Pager - [phone]  After 6pm go to [email or web address] - Proofreader  [facility]  Office  [phone]  CC: Primary care physician; Lorelee Market, MD   Note: This dictation was prepared with Dragon dictation along with smaller phrase technology. Any transcriptional errors that result from this process are unintentional.

## 2017-01-28 NOTE — Progress Notes (Signed)
Patient discharging to [facility]. Report called. EMS called. Waiting on transport.

## 2017-02-13 ENCOUNTER — Inpatient Hospital Stay
Admission: EM | Admit: 2017-02-13 | Discharge: 2017-02-15 | DRG: 872 | Disposition: A | Discharge status: Skilled Nursing Facility | Payer: Medicare Other | Attending: Internal Medicine | Admitting: Internal Medicine

## 2017-02-13 ENCOUNTER — Emergency Department: Payer: Medicare Other

## 2017-02-13 DIAGNOSIS — E785 Hyperlipidemia, unspecified: Secondary | ICD-10-CM | POA: Diagnosis present

## 2017-02-13 DIAGNOSIS — K219 Gastro-esophageal reflux disease without esophagitis: Secondary | ICD-10-CM | POA: Diagnosis present

## 2017-02-13 DIAGNOSIS — Z888 Allergy status to other drugs, medicaments and biological substances status: Secondary | ICD-10-CM | POA: Diagnosis not present

## 2017-02-13 DIAGNOSIS — E876 Hypokalemia: Secondary | ICD-10-CM | POA: Diagnosis present

## 2017-02-13 DIAGNOSIS — K5090 Crohn's disease, unspecified, without complications: Secondary | ICD-10-CM | POA: Diagnosis present

## 2017-02-13 DIAGNOSIS — R300 Dysuria: Secondary | ICD-10-CM | POA: Diagnosis present

## 2017-02-13 DIAGNOSIS — Z87820 Personal history of traumatic brain injury: Secondary | ICD-10-CM | POA: Diagnosis not present

## 2017-02-13 DIAGNOSIS — I10 Essential (primary) hypertension: Secondary | ICD-10-CM | POA: Diagnosis present

## 2017-02-13 DIAGNOSIS — Z91018 Allergy to other foods: Secondary | ICD-10-CM | POA: Diagnosis not present

## 2017-02-13 DIAGNOSIS — N390 Urinary tract infection, site not specified: Secondary | ICD-10-CM | POA: Diagnosis present

## 2017-02-13 DIAGNOSIS — E039 Hypothyroidism, unspecified: Secondary | ICD-10-CM | POA: Diagnosis present

## 2017-02-13 DIAGNOSIS — I950 Idiopathic hypotension: Secondary | ICD-10-CM | POA: Diagnosis present

## 2017-02-13 DIAGNOSIS — Z79899 Other long term (current) drug therapy: Secondary | ICD-10-CM

## 2017-02-13 DIAGNOSIS — I878 Other specified disorders of veins: Secondary | ICD-10-CM | POA: Diagnosis present

## 2017-02-13 DIAGNOSIS — F203 Undifferentiated schizophrenia: Secondary | ICD-10-CM | POA: Diagnosis present

## 2017-02-13 DIAGNOSIS — E119 Type 2 diabetes mellitus without complications: Secondary | ICD-10-CM | POA: Diagnosis present

## 2017-02-13 DIAGNOSIS — R4189 Other symptoms and signs involving cognitive functions and awareness: Secondary | ICD-10-CM | POA: Diagnosis present

## 2017-02-13 DIAGNOSIS — E871 Hypo-osmolality and hyponatremia: Secondary | ICD-10-CM | POA: Diagnosis not present

## 2017-02-13 DIAGNOSIS — E559 Vitamin D deficiency, unspecified: Secondary | ICD-10-CM | POA: Diagnosis present

## 2017-02-13 DIAGNOSIS — M7989 Other specified soft tissue disorders: Secondary | ICD-10-CM | POA: Diagnosis present

## 2017-02-13 DIAGNOSIS — A419 Sepsis, unspecified organism: Principal | ICD-10-CM | POA: Diagnosis present

## 2017-02-13 DIAGNOSIS — F329 Major depressive disorder, single episode, unspecified: Secondary | ICD-10-CM | POA: Diagnosis present

## 2017-02-13 DIAGNOSIS — R531 Weakness: Secondary | ICD-10-CM

## 2017-02-13 DIAGNOSIS — R609 Edema, unspecified: Secondary | ICD-10-CM

## 2017-02-13 LAB — DIFFERENTIAL
BAND NEUTROPHILS: 1 %
BASOS PCT: 0 %
BLASTS: 0 %
Basophils Absolute: 0 10*3/uL (ref 0–0.1)
EOS PCT: 0 %
Eosinophils Absolute: 0 10*3/uL (ref 0–0.7)
Lymphocytes Relative: 50 %
Lymphs Abs: 8.1 10*3/uL — ABNORMAL HIGH (ref 1.0–3.6)
METAMYELOCYTES PCT: 0 %
MONOS PCT: 4 %
MYELOCYTES: 0 %
Monocytes Absolute: 0.6 10*3/uL (ref 0.2–0.9)
NEUTROS ABS: 6.3 10*3/uL (ref 1.4–6.5)
NRBC: 0 /100{WBCs}
Neutrophils Relative %: 38 %
OTHER: 7 %
Promyelocytes Absolute: 0 %
Smear Review: ADEQUATE

## 2017-02-13 LAB — LACTIC ACID, PLASMA: LACTIC ACID, VENOUS: 1 mmol/L (ref 0.5–1.9)

## 2017-02-13 LAB — HEPATIC FUNCTION PANEL
ALT: 9 U/L — AB (ref 14–54)
AST: 18 U/L (ref 15–41)
Alkaline Phosphatase: 100 U/L (ref 38–126)
TOTAL PROTEIN: 4 g/dL — AB (ref 6.5–8.1)
Total Bilirubin: 0.4 mg/dL (ref 0.3–1.2)

## 2017-02-13 LAB — URINALYSIS, COMPLETE (UACMP) WITH MICROSCOPIC
BILIRUBIN URINE: NEGATIVE
Glucose, UA: NEGATIVE mg/dL
Hgb urine dipstick: NEGATIVE
Ketones, ur: NEGATIVE mg/dL
Nitrite: NEGATIVE
PH: 6 (ref 5.0–8.0)
Protein, ur: NEGATIVE mg/dL
SPECIFIC GRAVITY, URINE: 1.002 — AB (ref 1.005–1.030)

## 2017-02-13 LAB — CBC
HCT: 31.3 % — ABNORMAL LOW (ref 35.0–47.0)
Hemoglobin: 10.4 g/dL — ABNORMAL LOW (ref 12.0–16.0)
MCH: 31 pg (ref 26.0–34.0)
MCHC: 33.1 g/dL (ref 32.0–36.0)
MCV: 93.7 fL (ref 80.0–100.0)
PLATELETS: 607 10*3/uL — AB (ref 150–440)
RBC: 3.34 MIL/uL — ABNORMAL LOW (ref 3.80–5.20)
RDW: 15.3 % — AB (ref 11.5–14.5)
WBC: 16.2 10*3/uL — AB (ref 3.6–11.0)

## 2017-02-13 LAB — BASIC METABOLIC PANEL
Anion gap: 8 (ref 5–15)
BUN: 16 mg/dL (ref 6–20)
CO2: 19 mmol/L — ABNORMAL LOW (ref 22–32)
CREATININE: 1.16 mg/dL — AB (ref 0.44–1.00)
Calcium: 6.8 mg/dL — ABNORMAL LOW (ref 8.9–10.3)
Chloride: 100 mmol/L — ABNORMAL LOW (ref 101–111)
GFR calc Af Amer: >60 mL/min (ref >60)
GFR, EST NON AFRICAN AMERICAN: 56 mL/min — AB (ref >60)
GLUCOSE: 80 mg/dL (ref 65–99)
Potassium: 3.2 mmol/L — ABNORMAL LOW (ref 3.5–5.1)
SODIUM: 127 mmol/L — AB (ref 135–145)

## 2017-02-13 LAB — TSH: TSH: 6.03 u[IU]/mL — AB (ref 0.350–4.500)

## 2017-02-13 NOTE — ED Provider Notes (Addendum)
[facility] Emergency Department Provider Note   ____________________________________________   First MD Initiated Contact with Patient 02/13/17 1900     (approximate)  I have reviewed the triage vital signs and the nursing notes.   HISTORY  Chief Complaint Weakness    HPI [name] is a 46 y.o. female who has been at [facility] for a week. She reports her legs are weeping. Review of the old record shows that this was present previously. She reports she has been getting weak off and on and cannot get to the bathroom. This also seems to have been present previously. Patient told the nurse it was nothing new and told me it was new in the last couple days. Patient has a diagnosis of idiopathic hypotension and is on medication for it.   Past Medical History:  Diagnosis Date  . Abnormal mammogram, unspecified 2013   Prev. cytology,hypercellular smears without evidence of malignant cells. The cytopathologist questioned if samples truly representative. Care taken during sampling and is felt to be representative.  . Breast screening, unspecified 2013  . Broken leg    age 89  . Cellulitis   . Crohn's disease ([place]) 2013  . Depression   . Diabetes mellitus without complication (HCC)    non insulin dependent  . Early menopause   . Edema   . GERD (gastroesophageal reflux disease)   . Hiatal hernia 2013  . Hyperlipidemia   . Hypertension   . Hypothyroidism   . 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 Obesity, unspecified 2013  . Other sign and symptom in breast 2013   Right bst US,lower outer quadrant,A single 0.3x0.4x0.6cm hypoechoic mass with slightly lobulated borders with adjacent 0.3x0.4x0.5cm mass was noted. The 1st was 5cm from nipple, 2nd at 8 cm from the nipple. The previous lesion aspirate was at 3 0'clock position. These lesions are thought to account for the mammographic abnormality. Minimal interval change on [place].     [facility])   . Rib fracture    age 17  . Schizophrenia ([place])   . TBI (traumatic brain injury) ([place])   . Thyroid disease    hypothyroid  . Vitamin D deficiency     Patient Active Problem List   Diagnosis Date Noted  . Hypotension 01/23/2017  . Scalp laceration   . Pressure injury of skin 10/02/2016  . 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 Acute renal failure (ARF) ([place])   . Respiratory failure ([place]) 09/27/2016  . Other sign and symptom in breast     Past Surgical History:  Procedure Laterality Date  . COLONOSCOPY WITH PROPOFOL N/A 02/16/2016   Procedure: COLONOSCOPY WITH PROPOFOL;  Surgeon: Lollie Sails, MD;  Location: [facility] ENDOSCOPY;  Service: Endoscopy;  Laterality: N/A;  . COLONOSCOPY WITH PROPOFOL N/A 02/22/2016   Procedure: COLONOSCOPY WITH PROPOFOL;  Surgeon: Lollie Sails, MD;  Location[facility] ENDOSCOPY;  Service: Endoscopy;  Laterality: N/A;  . COLONOSCOPY WITH PROPOFOL N/A 10/14/2016   Procedure: COLONOSCOPY WITH PROPOFOL;  Surgeon: Jonathon Bellows, MD;  Location: [facility] ENDOSCOPY;  Service: Endoscopy;  Laterality: N/A;  . EYE SURGERY Left 2013   cataract surgery  . PERIPHERAL VASCULAR CATHETERIZATION N/A 10/13/2016   Procedure: Dialysis/Perma Catheter Insertion;  Surgeon: Algernon Huxley, MD;  Location: [facility] CV LAB;  Service: Cardiovascular;  Laterality: N/A;  . PERIPHERAL VASCULAR CATHETERIZATION N/A 10/18/2016   Procedure: Dialysis/Perma Catheter Removal;  Surgeon: Katha Cabal, MD;  Location: [facility] CV LAB;  Service: Cardiovascular;  Laterality: N/A;  . TUMOR EXCISION Right    age of 73, tumor removed from [place]    Prior to Admission medications   Medication Sig Start Date End Date Taking? Authorizing Provider  acetaminophen (TYLENOL) 325 MG tablet Take 650 mg by mouth every 6 (six) hours as needed for mild pain. Every 4 to 6 hours PRN    Historical Provider, MD  albuterol (PROVENTIL HFA;VENTOLIN HFA) 108 (90 Base)  MCG/ACT inhaler Inhale 1 puff into the lungs 4 (four) times daily as needed for wheezing or shortness of breath.    Historical Provider, MD  bisacodyl (DULCOLAX) 5 MG EC tablet Take 1 tablet (5 mg total) by mouth daily as needed for moderate constipation. 01/28/17   Demetrios Loll, MD  calcium carbonate (OSCAL) 1500 (600 Ca) MG TABS tablet Take 600 mg of elemental calcium by mouth 2 (two) times daily with a meal.    Historical Provider, MD  Cholecalciferol 5000 units TABS Take 5,000 Units by mouth daily.    Historical Provider, MD  cholestyramine light (PREVALITE) 4 g packet Take 1 packet (4 g total) by mouth 2 (two) times daily. 10/18/16 01/23/17  Henreitta Leber, MD  citalopram (CELEXA) 40 MG tablet Take 40 mg by mouth daily.    Historical Provider, MD  diphenhydrAMINE (BENADRYL) 50 MG capsule Take 50 mg by mouth at bedtime.    Historical Provider, MD  docusate sodium (COLACE) 100 MG capsule Take 100 mg by mouth 2 (two) times daily.    Historical Provider, MD  gemfibrozil (LOPID) 600 MG tablet Take 600 mg by mouth 2 (two) times daily.    Historical Provider, MD  hyoscyamine (ANASPAZ) 0.125 MG TBDP disintergrating tablet Place 0.125 mg under the tongue every 6 (six) hours as needed.    Historical Provider, MD  levothyroxine (SYNTHROID, LEVOTHROID) 25 MCG tablet Take 25 mcg by mouth every morning.    Historical Provider, MD  LORazepam (ATIVAN) 0.5 MG tablet Take 1 tablet (0.5 mg total) by mouth every 4 (four) hours as needed for anxiety. 01/28/17   Demetrios Loll, MD  LORazepam (ATIVAN) 1 MG tablet Take 1 tablet (1 mg total) by mouth every morning. 01/28/17   Demetrios Loll, MD  mesalamine (APRISO) 0.375 g 24 hr capsule Take 1,500 mg by mouth every morning.    Historical Provider, MD  mesalamine (ROWASA) 4 g enema Place 60 mLs (4 g total) rectally at bedtime. 10/18/16 01/23/17  Henreitta Leber, MD  midodrine (PROAMATINE) 10 MG tablet Take 1 tablet (10 mg total) by mouth 3 (three) times daily with meals. 01/28/17   Demetrios Loll, MD  omega-3 acid ethyl esters (LOVAZA) 1 g capsule Take 2 g by mouth every morning.     Historical Provider, MD  ondansetron (ZOFRAN) 4 MG tablet Take 4 mg by mouth every 6 (six) hours as needed for nausea or vomiting.    Historical Provider, MD  pantoprazole (PROTONIX) 40 MG tablet Take 40 mg by mouth daily.     Historical Provider, MD  potassium chloride (K-DUR) 10 MEQ tablet Take 20 mEq by mouth 2 (two) times daily.    Historical Provider, MD  pseudoephedrine-guaifenesin (MUCINEX D) 60-600 MG 12 hr tablet Take 2 tablets by mouth every 12 (twelve) hours as needed for congestion.     Historical Provider, MD  risperiDONE (RISPERDAL M-TAB) 1 MG disintegrating tablet Take 1 mg by mouth 2 (two) times daily.    Historical Provider,  MD  simvastatin (ZOCOR) 20 MG tablet Take 20 mg by mouth at bedtime.    Historical Provider, MD  vitamin B-12 (CYANOCOBALAMIN) 1000 MCG tablet Take 1,000 mcg by mouth daily.    Historical Provider, MD    Allergies Peanut oil and Risperidone and related  Family History  Problem Relation Age of Onset  . Cancer Mother 73    colon  . Cancer Paternal Aunt     Social History Social History  Substance Use Topics  . Smoking status: Never Smoker  . Smokeless tobacco: Never Used  . Alcohol use No    Review of Systems Constitutional: No fever/chills Eyes: No visual changes. ENT: No sore throat. Cardiovascular: Denies chest pain. Respiratory: Denies shortness of breath. Gastrointestinal: No abdominal pain.  No nausea, no vomiting.  No diarrhea.  No constipation. Genitourinary: Negative for dysuria. Musculoskeletal: Negative for back pain.   10-point ROS otherwise negative.  ____________________________________________   PHYSICAL EXAM:  VITAL SIGNS: ED Triage Vitals  Enc Vitals Group     BP 02/13/17 1901 (!) 82/46     Pulse Rate 02/13/17 1901 90     Resp 02/13/17 1901 20     Temp 02/13/17 1901 97.4 F (36.3 C)     Temp Source 02/13/17 1901 Oral       SpO2 02/13/17 1900 100 %     Weight 02/13/17 1901 256 lb (116.1 kg)     Height 02/13/17 1901 5\' 6"  (1.676 m)     Head Circumference --      [facility] --      Pain Score 02/13/17 1900 0     Pain Loc --      Pain Edu? --      Excl. in [place]? --     Constitutional: Alert and oriented.  in no acute distress. Eyes: Conjunctivae are normal. PERRL. EOMI. Head: Atraumatic. Nose: No congestion/rhinnorhea. Mouth/Throat: Mucous membranes are moist.  Oropharynx non-erythematous. Neck: No stridor. Cardiovascular: Normal rate, regular rhythm. Grossly normal heart sounds.  Good peripheral circulation. Respiratory: Normal respiratory effort.  No retractions. Lungs CTAB. Gastrointestinal: Soft and nontender. No distention. No abdominal bruits. No CVA tenderness. Musculoskeletal: Lower extremity edema with chronic pieces venous stasis changes and some weeping. She also has bumps on the skin which are blisters with very thick covers. Her left arm is almost an inch bigger in diameter than the right one. Neurologic:  Normal speech and language. No gross focal neurologic deficits are appreciated. Specifically there is no leg weakness at present Rectal: Normal rectal tone  ____________________________________________   LABS (all labs ordered are listed, but only abnormal results are displayed)  Labs Reviewed  BASIC METABOLIC PANEL - Abnormal; Notable for the following:       Result Value   Sodium 127 (*)    Potassium 3.2 (*)    Chloride 100 (*)    CO2 19 (*)    Creatinine, Ser 1.16 (*)    Calcium 6.8 (*)    GFR calc non Af Amer 56 (*)    All other components within normal limits  CBC - Abnormal; Notable for the following:    WBC 16.2 (*)    RBC 3.34 (*)    Hemoglobin 10.4 (*)    HCT 31.3 (*)    RDW 15.3 (*)    Platelets 607 (*)    All other components within normal limits  DIFFERENTIAL - Abnormal; Notable for the following:    Lymphs Abs 8.1 (*)    All  other components within normal  limits  LACTIC ACID, PLASMA  URINALYSIS, COMPLETE (UACMP) WITH MICROSCOPIC  LACTIC ACID, PLASMA  TSH  HEPATIC FUNCTION PANEL  CBG MONITORING, ED   ____________________________________________  EKG  ____________________________________________  RADIOLOGY  tudy Result   CLINICAL DATA:  Left arm edema  EXAM: Left UPPER EXTREMITY VENOUS DOPPLER ULTRASOUND  TECHNIQUE: Gray-scale sonography with graded compression, as well as color Doppler and duplex ultrasound were performed to evaluate the upper extremity deep venous system from the level of the subclavian vein and including the jugular, axillary, basilic, radial, ulnar and upper cephalic vein. Spectral Doppler was utilized to evaluate flow at rest and with distal augmentation maneuvers.  COMPARISON:  None.  FINDINGS: Contralateral Subclavian Vein: Respiratory phasicity is normal and symmetric with the symptomatic side. No evidence of thrombus. Normal compressibility.  Internal Jugular Vein: No evidence of thrombus. Normal compressibility, respiratory phasicity and response to augmentation.  Subclavian Vein: No evidence of thrombus. Normal compressibility, respiratory phasicity and response to augmentation.  Axillary Vein: No evidence of thrombus. Normal compressibility, respiratory phasicity and response to augmentation.  Cephalic Vein: No evidence of thrombus. Normal compressibility, respiratory phasicity and response to augmentation.  Basilic Vein: No evidence of thrombus. Normal compressibility, respiratory phasicity and response to augmentation.  Brachial Veins: No evidence of thrombus. Normal compressibility, respiratory phasicity and response to augmentation.  Radial Veins: No evidence of thrombus.  Normal compressibility  Ulnar Veins: No evidence of thrombus.  Normal compressibility  Venous Reflux:  None visualized.  Other Findings:  None visualized.  IMPRESSION: No evidence of deep  venous thrombosis.   Electronically Signed   By: Donavan Foil M.D.   On: 02/13/2017 21:39    ____________________________________________   PROCEDURES  Procedure(s) performed:  Procedures  Critical Care performed:   ____________________________________________   INITIAL IMPRESSION / ASSESSMENT AND PLAN / ED COURSE  Pertinent labs & imaging results that were available during my care of the patient were reviewed by me and considered in my medical decision making (see chart for details).        ____________________________________________   FINAL CLINICAL IMPRESSION(S) / ED DIAGNOSES  Final diagnoses:  Weakness  Hyponatremia      NEW MEDICATIONS STARTED DURING THIS VISIT:  New Prescriptions   No medications on file     Note:  This document was prepared using Dragon voice recognition software and may include unintentional dictation errors.    Nena Polio, MD 02/13/17 2030    Nena Polio, MD 02/13/17 [phone]

## 2017-02-13 NOTE — ED Triage Notes (Signed)
Pt arrived via ems from [facility] - they stated that pt awoke this am with weakness in bilat ext and they have now decided that they want her evaluated for this - pt has extreme edema in bilat arms and legs - pt legs are weeping and she reports that this is new for her

## 2017-02-13 NOTE — H&P (Signed)
History and Physical   [facility] - [place] @ [facility] Admission History and Physical [employer], D.O.    Patient Name: [name] MR#: [id number] Date of Birth: Feb 12, 1971 Date of Admission: 02/13/2017  Referring MD/NP/PA: Dr. Cinda Quest Primary Care Physician: Lorelee Market, MD Patient coming from: [facility]   Chief Complaint:  Chief Complaint  Patient presents with  . Weakness  Please note the entire history is obtained from the patient's emergency department chart, emergency department provider. Patient's personal history is limited by poor historian.    HPI: [name] is a 46 y.o. female with a known history of Crohn's, DM, depression, GERD, HLD, HTN, hypothyroidism, schizophrenia, TBI presents to the emergency department for evaluation of weakness.  Patient reports living at [facility] following a hospitalization.  She has been essentially bed bound for the past month getting up infrequently and using a walker.  She states her immobility is due to weakness.  She has not felt well for over a month.  She also complains of dysuria, frequency, vaginal burning, lower extremity swelling and blisters.  She also asked to have her stomach pumped because a nurse at [facility] is trying to poison her with risperidone which she is known to be allergic to.  .  Patient denies fevers/chills, weakness, dizziness, chest pain, shortness of breath, N/V/C/D, abdominal pain, dysuria/frequency, changes in mental status.    Of note patient was admittted here in 11/17 with severe sepsis.  She has also recently been started on midodrine for hypotension.    Review of Systems:  CONSTITUTIONAL: No fever/chills, weight gain/loss, headache. Positive generalized weakness, fatigue and immobility EYES: No blurry or double vision. ENT: No tinnitus, postnasal drip, redness or soreness of the oropharynx. RESPIRATORY: No cough, dyspnea, wheeze.  No hemoptysis.  CARDIOVASCULAR: No chest  pain, palpitations, syncope, orthopnea. Positive LUE and BL lower extremity edema.  GASTROINTESTINAL: No nausea, vomiting, abdominal pain, diarrhea, constipation.  No hematemesis, melena or hematochezia. GENITOURINARY: Positive dysuria, frequency, vaginal burning ENDOCRINE: No polyuria or nocturia. No heat or cold intolerance. HEMATOLOGY: No anemia, bruising, bleeding. INTEGUMENTARY: No rashes, ulcers, lesions. MUSCULOSKELETAL: No arthritis, gout, dyspnea. NEUROLOGIC: No numbness, tingling, ataxia, seizure-type activity, weakness. PSYCHIATRIC: No anxiety, depression, insomnia. Positive paranoia.    Past Medical History:  Diagnosis Date  . Abnormal mammogram, unspecified 2013   Prev. cytology,hypercellular smears without evidence of malignant cells. The cytopathologist questioned if samples truly representative. Care taken during sampling and is felt to be representative.  . Breast screening, unspecified 2013  . Broken leg    age 56  . Cellulitis   . Crohn's disease ([place]) 2013  . Depression   . Diabetes mellitus without complication (HCC)    non insulin dependent  . Early menopause   . Edema   . GERD (gastroesophageal reflux disease)   . Hiatal hernia 2013  . Hyperlipidemia   . Hypertension   . Hypothyroidism   . 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 Obesity, unspecified 2013  . Other sign and symptom in breast 2013   Right bst US,lower outer quadrant,A single 0.3x0.4x0.6cm hypoechoic mass with slightly lobulated borders with adjacent 0.3x0.4x0.5cm mass was noted. The 1st was 5cm from nipple, 2nd at 8 cm from the nipple. The previous lesion aspirate was at 3 0'clock position. These lesions are thought to account for the mammographic abnormality. Minimal interval change on [place].   [facility])   . Rib fracture    age  8  . Schizophrenia ([place])   . TBI (traumatic brain injury) ([place])   . Thyroid disease    hypothyroid  . Vitamin D deficiency      Past Surgical History:  Procedure Laterality Date  . COLONOSCOPY WITH PROPOFOL N/A 02/16/2016   Procedure: COLONOSCOPY WITH PROPOFOL;  Surgeon: Lollie Sails, MD;  Location: [facility] ENDOSCOPY;  Service: Endoscopy;  Laterality: N/A;  . COLONOSCOPY WITH PROPOFOL N/A 02/22/2016   Procedure: COLONOSCOPY WITH PROPOFOL;  Surgeon: Lollie Sails, MD;  Location: [facility] ENDOSCOPY;  Service: Endoscopy;  Laterality: N/A;  . COLONOSCOPY WITH PROPOFOL N/A 10/14/2016   Procedure: COLONOSCOPY WITH PROPOFOL;  Surgeon: Jonathon Bellows, MD;  Location: [facility] ENDOSCOPY;  Service: Endoscopy;  Laterality: N/A;  . EYE SURGERY Left 2013   cataract surgery  . PERIPHERAL VASCULAR CATHETERIZATION N/A 10/13/2016   Procedure: Dialysis/Perma Catheter Insertion;  Surgeon: Algernon Huxley, MD;  Location: [place] CV LAB;  Service: Cardiovascular;  Laterality: N/A;  . PERIPHERAL VASCULAR CATHETERIZATION N/A 10/18/2016   Procedure: Dialysis/Perma Catheter Removal;  Surgeon: Katha Cabal, MD;  Location: [place] CV LAB;  Service: Cardiovascular;  Laterality: N/A;  . TUMOR EXCISION Right    age of 78, tumor removed from [place]     reports that she has never smoked. She has never used smokeless tobacco. She reports that she does not drink alcohol or use drugs.  Allergies  Allergen Reactions  . Peanut Oil Other (See Comments)    Face turns red  . Risperidone And Related Cough    Family History  Problem Relation Age of Onset  . Cancer Mother 36    colon  . Cancer Paternal Aunt     Prior to Admission medications   Medication Sig Start Date End Date Taking? Authorizing Provider  acetaminophen (TYLENOL) 325 MG tablet Take 650 mg by mouth every 6 (six) hours as needed for mild pain. Every 4 to 6 hours PRN   Yes Historical Provider, MD  Amino Acids-Protein Hydrolys (FEEDING SUPPLEMENT, PRO-STAT SUGAR FREE 64,) LIQD Take 30 mLs by mouth 3 (three) times daily with meals.   Yes Historical Provider, MD  ascorbic acid  (VITAMIN C) 500 MG tablet Take 500 mg by mouth 2 (two) times daily.   Yes Historical Provider, MD  bisacodyl (DULCOLAX) 5 MG EC tablet Take 1 tablet (5 mg total) by mouth daily as needed for moderate constipation. 01/28/17  Yes Demetrios Loll, MD  calcium carbonate (OSCAL) 1500 (600 Ca) MG TABS tablet Take 600 mg of elemental calcium by mouth 2 (two) times daily with a meal.   Yes Historical Provider, MD  Cholecalciferol 5000 units TABS Take 5,000 Units by mouth daily.   Yes Historical Provider, MD  cholestyramine light (PREVALITE) 4 g packet Take 1 packet (4 g total) by mouth 2 (two) times daily. 10/18/16 02/13/17 Yes Henreitta Leber, MD  citalopram (CELEXA) 40 MG tablet Take 40 mg by mouth daily.   Yes Historical Provider, MD  diphenhydrAMINE (BENADRYL) 50 MG capsule Take 50 mg by mouth at bedtime.   Yes Historical Provider, MD  docusate sodium (COLACE) 100 MG capsule Take 100 mg by mouth 2 (two) times daily.   Yes Historical Provider, MD  gemfibrozil (LOPID) 600 MG tablet Take 600 mg by mouth 2 (two) times daily.   Yes Historical Provider, MD  hyoscyamine (ANASPAZ) 0.125 MG TBDP disintergrating tablet Place 0.125 mg under the tongue every 6 (six) hours as needed.   Yes Historical Provider, MD  Levothyroxine  Sodium 50 MCG CAPS Take 50 mcg by mouth every morning.    Yes Historical Provider, MD  LORazepam (ATIVAN) 0.5 MG tablet Take 1 tablet (0.5 mg total) by mouth every 4 (four) hours as needed for anxiety. 01/28/17  Yes Demetrios Loll, MD  LORazepam (ATIVAN) 1 MG tablet Take 1 tablet (1 mg total) by mouth every morning. 01/28/17  Yes Demetrios Loll, MD  magnesium oxide (MAG-OX) 400 MG tablet Take 400 mg by mouth daily.   Yes Historical Provider, MD  mesalamine (APRISO) 0.375 g 24 hr capsule Take 1,500 mg by mouth every morning.   Yes Historical Provider, MD  midodrine (PROAMATINE) 10 MG tablet Take 1 tablet (10 mg total) by mouth 3 (three) times daily with meals. 01/28/17  Yes Demetrios Loll, MD  Multiple Vitamin  (MULTIVITAMIN) tablet Take 1 tablet by mouth daily.   Yes Historical Provider, MD  omega-3 acid ethyl esters (LOVAZA) 1 g capsule Take 2 g by mouth every morning.    Yes Historical Provider, MD  pantoprazole (PROTONIX) 40 MG tablet Take 40 mg by mouth daily.    Yes Historical Provider, MD  potassium chloride (K-DUR) 10 MEQ tablet Take 20 mEq by mouth 2 (two) times daily.   Yes Historical Provider, MD  simvastatin (ZOCOR) 20 MG tablet Take 20 mg by mouth at bedtime.   Yes Historical Provider, MD  vitamin B-12 (CYANOCOBALAMIN) 1000 MCG tablet Take 1,000 mcg by mouth daily.   Yes Historical Provider, MD  albuterol (PROVENTIL HFA;VENTOLIN HFA) 108 (90 Base) MCG/ACT inhaler Inhale 1 puff into the lungs 4 (four) times daily as needed for wheezing or shortness of breath.    Historical Provider, MD  mesalamine (ROWASA) 4 g enema Place 60 mLs (4 g total) rectally at bedtime. 10/18/16 01/23/17  Henreitta Leber, MD  ondansetron (ZOFRAN) 4 MG tablet Take 4 mg by mouth every 6 (six) hours as needed for nausea or vomiting.    Historical Provider, MD  pseudoephedrine-guaifenesin (MUCINEX D) 60-600 MG 12 hr tablet Take 2 tablets by mouth every 12 (twelve) hours as needed for congestion.     Historical Provider, MD  risperiDONE (RISPERDAL M-TAB) 1 MG disintegrating tablet Take 1 mg by mouth 2 (two) times daily.    Historical Provider, MD    Physical Exam: Vitals:   02/13/17 2100 02/13/17 2200 02/13/17 2230 02/13/17 2300  BP: (!) 98/43 (!) 96/52 (!) 73/40 (!) 88/55  Pulse: 94 100 86 85  Resp: 15 (!) 21 15 15   Temp:      TempSrc:      SpO2: 98% 100% 100% 100%  Weight:      Height:        GENERAL: 46 y.o.-year-old white female patient, well-developed, well-nourished lying in the bed in no acute distress.  Pleasant and cooperative, but odd affect. Globally weak. HEENT: Head atraumatic, normocephalic. Pupils equal, round, reactive to light and accommodation. No scleral icterus. Extraocular muscles intact. Nares  are patent. Oropharynx is clear. Mucus membranes moist. NECK: Supple, full range of motion. No JVD, no bruit heard. No thyroid enlargement, no tenderness, no cervical lymphadenopathy. CHEST: Normal breath sounds bilaterally. No wheezing, rales, rhonchi or crackles. No use of accessory muscles of respiration.  No reproducible chest wall tenderness.  CARDIOVASCULAR: S1, S2 normal. No murmurs, rubs, or gallops. Cap refill <2 seconds. Pulses intact distally.  ABDOMEN: Soft, nondistended, nontender. No rebound, guarding, rigidity. Normoactive bowel sounds present in all four quadrants. No organomegaly or mass. EXTREMITIES: Severe pitting edema bilateral  lower extremities with blisters, some weeping. Mild erythema, no warm. Tender to palpation.   Positive nonpitting LUE edema.  NEUROLOGIC: The patient is alert and oriented x 3. Cranial nerves II through XII are grossly intact with no focal sensorimotor deficit. Muscle strength 5/5 in all extremities. Sensation intact. Gait not checked. PSYCHIATRIC:  Odd affect, depressed mood, paranoid thoughts   Labs on Admission:  CBC:  Recent Labs Lab 02/13/17 1913  WBC 16.2*  NEUTROABS 6.3  HGB 10.4*  HCT 31.3*  MCV 93.7  PLT 979*   Basic Metabolic Panel:  Recent Labs Lab 02/13/17 1913  NA 127*  K 3.2*  CL 100*  CO2 19*  GLUCOSE 80  BUN 16  CREATININE 1.16*  CALCIUM 6.8*   GFR: Estimated Creatinine Clearance: 79.3 mL/min (A) (by C-G formula based on SCr of 1.16 mg/dL (H)). Liver Function Tests:  Recent Labs Lab 02/13/17 1913  AST 18  ALT 9*  ALKPHOS 100  BILITOT 0.4  PROT 4.0*  ALBUMIN <1.0*   No results for input(s): LIPASE, AMYLASE in the last 168 hours. No results for input(s): AMMONIA in the last 168 hours. Coagulation Profile: No results for input(s): INR, PROTIME in the last 168 hours. Cardiac Enzymes: No results for input(s): CKTOTAL, CKMB, CKMBINDEX, TROPONINI in the last 168 hours. BNP (last 3 results) No results for  input(s): PROBNP in the last 8760 hours. HbA1C: No results for input(s): HGBA1C in the last 72 hours. CBG: No results for input(s): GLUCAP in the last 168 hours. Lipid Profile: No results for input(s): CHOL, HDL, LDLCALC, TRIG, CHOLHDL, LDLDIRECT in the last 72 hours. Thyroid Function Tests:  Recent Labs  02/13/17 1913  TSH 6.030*   Anemia Panel: No results for input(s): VITAMINB12, FOLATE, FERRITIN, TIBC, IRON, RETICCTPCT in the last 72 hours. Urine analysis:    Component Value Date/Time   COLORURINE YELLOW (A) 02/13/2017 2026   APPEARANCEUR HAZY (A) 02/13/2017 2026   APPEARANCEUR Turbid 07/27/2012 1059   LABSPEC 1.002 (L) 02/13/2017 2026   LABSPEC 1.025 07/27/2012 1059   PHURINE 6.0 02/13/2017 2026   GLUCOSEU NEGATIVE 02/13/2017 2026   GLUCOSEU Negative 07/27/2012 1059   HGBUR NEGATIVE 02/13/2017 2026   BILIRUBINUR NEGATIVE 02/13/2017 2026   BILIRUBINUR Negative 07/27/2012 [place] 02/13/2017 2026   PROTEINUR NEGATIVE 02/13/2017 2026   NITRITE NEGATIVE 02/13/2017 2026   LEUKOCYTESUR LARGE (A) 02/13/2017 2026   LEUKOCYTESUR Negative 07/27/2012 1059   Sepsis Labs: @LABRCNTIP (procalcitonin:4,lacticidven:4) )No results found for this or any previous visit (from the past 240 hour(s)).   Radiological Exams on Admission: US Venous Img Upper Uni Left  Result Date: 02/13/2017 CLINICAL DATA:  Left arm edema EXAM: Left UPPER EXTREMITY VENOUS DOPPLER ULTRASOUND TECHNIQUE: Gray-scale sonography with graded compression, as well as color Doppler and duplex ultrasound were performed to evaluate the upper extremity deep venous system from the level of the subclavian vein and including the jugular, axillary, basilic, radial, ulnar and upper cephalic vein. Spectral Doppler was utilized to evaluate flow at rest and with distal augmentation maneuvers. COMPARISON:  None. FINDINGS: Contralateral Subclavian Vein: Respiratory phasicity is normal and symmetric with the symptomatic  side. No evidence of thrombus. Normal compressibility. Internal Jugular Vein: No evidence of thrombus. Normal compressibility, respiratory phasicity and response to augmentation. Subclavian Vein: No evidence of thrombus. Normal compressibility, respiratory phasicity and response to augmentation. Axillary Vein: No evidence of thrombus. Normal compressibility, respiratory phasicity and response to augmentation. Cephalic Vein: No evidence of thrombus. Normal compressibility, respiratory phasicity and  response to augmentation. Basilic Vein: No evidence of thrombus. Normal compressibility, respiratory phasicity and response to augmentation. Brachial Veins: No evidence of thrombus. Normal compressibility, respiratory phasicity and response to augmentation. Radial Veins: No evidence of thrombus.  Normal compressibility Ulnar Veins: No evidence of thrombus.  Normal compressibility Venous Reflux:  None visualized. Other Findings:  None visualized. IMPRESSION: No evidence of deep venous thrombosis. Electronically Signed   By: Donavan Foil M.D.   On: 02/13/2017 21:39   Assessment/Plan  This is a 46 y.o. female with a history of Crohn's, DM, depression, GERD, HLD, HTN, hypothyroidism, schizophrenia, TBI now being admitted with:  #. Sepsis 2/2 UTI - Admit to inpatient with telemetry monitoring - IV antibiotics: Maxipime, Vanco - IV fluid hydration - Follow up blood,urine & sputum cultures - Repeat CBC in am.  - Continue midodrine for hypotension  #. Weakness secondary to hyponatremia - IVFs and repeat BMP - PT eval  #. Hypokalemia - Replace PO  #. LUE edema - doppler negative for DVT. Likely stasis secondary to immobility.   #. Weeping edema B/L lower extremities, likely stasis secondary to immobility - B/L LE dopplers  - Wound culture  #. History of depression, schizophrenia - Continue Celexa, Ativan - Hold Risperdone secondary to reported allergy. - Psych consultation  #. History of HLD -  Continue Lopid, Zocor, Lovaza  #. History of GERD - Continue Protonix  #. History of Crohn's - Continue mesalamine, hyoscyamine  #. History of hypothyroidism - Continue Synthroid  #. H/o Diabetes - Accuchecks achs with RISS coverage - Heart healthy, carb controlled diet  Admission status: Inpatient, tele IV Fluids: NS Diet/Nutrition: HH, CC Consults called: Psych DVT Px: Lovenox, SCDs and early ambulation. Code Status: Full Code  Disposition Plan: To home in 1-2 days  All the records are reviewed and case discussed with ED provider. Management plans discussed with the patient and/or family who express understanding and agree with plan of care.  Sharone Almond D.O. on 02/13/2017 at 11:24 PM Between 7am to 6pm - Pager - [phone] After 6pm go to [email or web address] - [employer] [facility] [place] [facility] Office [phone] CC: Primary care physician; Lorelee Market, MD   02/13/2017, 11:24 PM

## 2017-02-13 NOTE — ED Notes (Signed)
Pt arrived via ems from [facility] - they stated that pt awoke this am with weakness in bilat ext and they have now decided that they want her evaluated for this - pt has extreme edema in bilat arms and legs - pt legs are weeping and she reports that this is new for her - pt lungs are clear in bilat upper lobes but diminished in bilat lower lobes

## 2017-02-13 NOTE — ED Notes (Signed)
Pt in and out cath with the assistance of Apolonio Schneiders RN - pt tolerated well

## 2017-02-14 ENCOUNTER — Inpatient Hospital Stay: Payer: Medicare Other

## 2017-02-14 DIAGNOSIS — F203 Undifferentiated schizophrenia: Secondary | ICD-10-CM

## 2017-02-14 LAB — BASIC METABOLIC PANEL
ANION GAP: 7 (ref 5–15)
BUN: 17 mg/dL (ref 6–20)
CALCIUM: 6.7 mg/dL — AB (ref 8.9–10.3)
CO2: 22 mmol/L (ref 22–32)
CREATININE: 1.24 mg/dL — AB (ref 0.44–1.00)
Chloride: 101 mmol/L (ref 101–111)
GFR, EST AFRICAN AMERICAN: 60 mL/min — AB (ref >60)
GFR, EST NON AFRICAN AMERICAN: 52 mL/min — AB (ref >60)
Glucose, Bld: 83 mg/dL (ref 65–99)
Potassium: 3 mmol/L — ABNORMAL LOW (ref 3.5–5.1)
SODIUM: 130 mmol/L — AB (ref 135–145)

## 2017-02-14 LAB — CBC
HCT: 28.2 % — ABNORMAL LOW (ref 35.0–47.0)
Hemoglobin: 9.4 g/dL — ABNORMAL LOW (ref 12.0–16.0)
MCH: 31.7 pg (ref 26.0–34.0)
MCHC: 33.4 g/dL (ref 32.0–36.0)
MCV: 94.9 fL (ref 80.0–100.0)
Platelets: 652 10*3/uL — ABNORMAL HIGH (ref 150–440)
RBC: 2.97 MIL/uL — AB (ref 3.80–5.20)
RDW: 15 % — AB (ref 11.5–14.5)
WBC: 13.8 10*3/uL — AB (ref 3.6–11.0)

## 2017-02-14 LAB — PROCALCITONIN: Procalcitonin: 0.16 ng/mL

## 2017-02-14 LAB — PROTIME-INR
INR: 1.15
PROTHROMBIN TIME: 14.8 s (ref 11.4–15.2)

## 2017-02-14 LAB — APTT: APTT: 44 s — AB (ref 24–36)

## 2017-02-14 LAB — MRSA PCR SCREENING: MRSA BY PCR: NEGATIVE

## 2017-02-14 LAB — PHOSPHORUS: Phosphorus: 3.5 mg/dL (ref 2.5–4.6)

## 2017-02-14 LAB — MAGNESIUM: MAGNESIUM: 1.8 mg/dL (ref 1.7–2.4)

## 2017-02-14 LAB — FIBRIN DERIVATIVES D-DIMER (ARMC ONLY): Fibrin derivatives D-dimer (ARMC): 668.46 — ABNORMAL HIGH (ref 0.00–499.00)

## 2017-02-14 MED ORDER — PRO-STAT SUGAR FREE PO LIQD
30.0000 mL | Freq: Three times a day (TID) | ORAL | Status: DC
  Administered 2017-02-14 – 2017-02-15 (×4): 30 mL via ORAL

## 2017-02-14 MED ORDER — VITAMIN D 1000 UNITS PO TABS
5000.0000 [IU] | ORAL_TABLET | Freq: Every day | ORAL | Status: DC
  Administered 2017-02-14 – 2017-02-15 (×2): 5000 [IU] via ORAL
  Filled 2017-02-14 (×2): qty 5

## 2017-02-14 MED ORDER — DOCUSATE SODIUM 100 MG PO CAPS
100.0000 mg | ORAL_CAPSULE | Freq: Two times a day (BID) | ORAL | Status: DC
  Administered 2017-02-14: 100 mg via ORAL
  Filled 2017-02-14 (×2): qty 1

## 2017-02-14 MED ORDER — LORAZEPAM 1 MG PO TABS
1.0000 mg | ORAL_TABLET | ORAL | Status: DC
Start: 2017-02-14 — End: 2017-02-15
  Administered 2017-02-14 – 2017-02-15 (×2): 1 mg via ORAL
  Filled 2017-02-14 (×2): qty 1

## 2017-02-14 MED ORDER — POTASSIUM CHLORIDE CRYS ER 20 MEQ PO TBCR
20.0000 meq | EXTENDED_RELEASE_TABLET | Freq: Two times a day (BID) | ORAL | Status: DC
  Administered 2017-02-15: 20 meq via ORAL
  Filled 2017-02-14: qty 1

## 2017-02-14 MED ORDER — DEXTROSE 5 % IV SOLN
2.0000 g | Freq: Once | INTRAVENOUS | Status: AC
  Administered 2017-02-14: 2 g via INTRAVENOUS
  Filled 2017-02-14: qty 2

## 2017-02-14 MED ORDER — ONDANSETRON HCL 4 MG PO TABS: 4.0000 mg | ORAL_TABLET | Freq: Four times a day (QID) | ORAL | Status: DC | PRN

## 2017-02-14 MED ORDER — OMEGA-3-ACID ETHYL ESTERS 1 G PO CAPS
2.0000 g | ORAL_CAPSULE | ORAL | Status: DC
  Administered 2017-02-14 – 2017-02-15 (×2): 2 g via ORAL
  Filled 2017-02-14 (×2): qty 2

## 2017-02-14 MED ORDER — DEXTROSE 5 % IV SOLN
2.0000 g | Freq: Two times a day (BID) | INTRAVENOUS | Status: DC
  Administered 2017-02-14 – 2017-02-15 (×2): 2 g via INTRAVENOUS
  Filled 2017-02-14 (×4): qty 2

## 2017-02-14 MED ORDER — GEMFIBROZIL 600 MG PO TABS
600.0000 mg | ORAL_TABLET | Freq: Two times a day (BID) | ORAL | Status: DC
  Administered 2017-02-14 – 2017-02-15 (×4): 600 mg via ORAL
  Filled 2017-02-14 (×4): qty 1

## 2017-02-14 MED ORDER — ENOXAPARIN SODIUM 40 MG/0.4ML ~~LOC~~ SOLN
40.0000 mg | Freq: Two times a day (BID) | SUBCUTANEOUS | Status: DC
  Administered 2017-02-14 – 2017-02-15 (×3): 40 mg via SUBCUTANEOUS
  Filled 2017-02-14 (×3): qty 0.4

## 2017-02-14 MED ORDER — CALCIUM CARBONATE 1500 (600 CA) MG PO TABS: 600.0000 mg | ORAL_TABLET | Freq: Two times a day (BID) | ORAL | Status: DC

## 2017-02-14 MED ORDER — BISACODYL 5 MG PO TBEC: 5.0000 mg | DELAYED_RELEASE_TABLET | Freq: Every day | ORAL | Status: DC | PRN

## 2017-02-14 MED ORDER — VITAMIN C 500 MG PO TABS
500.0000 mg | ORAL_TABLET | Freq: Two times a day (BID) | ORAL | Status: DC
  Administered 2017-02-14 – 2017-02-15 (×4): 500 mg via ORAL
  Filled 2017-02-14 (×4): qty 1

## 2017-02-14 MED ORDER — ACETAMINOPHEN 325 MG PO TABS: 650.0000 mg | ORAL_TABLET | Freq: Four times a day (QID) | ORAL | Status: DC | PRN

## 2017-02-14 MED ORDER — LEVOTHYROXINE SODIUM 50 MCG PO CAPS: 50.0000 ug | ORAL_CAPSULE | ORAL | Status: DC

## 2017-02-14 MED ORDER — MIDODRINE HCL 5 MG PO TABS
10.0000 mg | ORAL_TABLET | Freq: Three times a day (TID) | ORAL | Status: DC
  Administered 2017-02-14 – 2017-02-15 (×5): 10 mg via ORAL
  Filled 2017-02-14 (×5): qty 2

## 2017-02-14 MED ORDER — ADULT MULTIVITAMIN W/MINERALS CH
1.0000 | ORAL_TABLET | Freq: Every day | ORAL | Status: DC
  Administered 2017-02-14 – 2017-02-15 (×2): 1 via ORAL
  Filled 2017-02-14 (×2): qty 1

## 2017-02-14 MED ORDER — ONDANSETRON HCL 4 MG/2ML IJ SOLN: 4.0000 mg | Freq: Four times a day (QID) | INTRAMUSCULAR | Status: DC | PRN

## 2017-02-14 MED ORDER — IPRATROPIUM BROMIDE 0.02 % IN SOLN: 0.5000 mg | Freq: Four times a day (QID) | RESPIRATORY_TRACT | Status: DC | PRN

## 2017-02-14 MED ORDER — CALCIUM CARBONATE ANTACID 500 MG PO CHEW
3.0000 | CHEWABLE_TABLET | Freq: Two times a day (BID) | ORAL | Status: DC
  Administered 2017-02-14 – 2017-02-15 (×3): 600 mg via ORAL
  Filled 2017-02-14 (×3): qty 3

## 2017-02-14 MED ORDER — HYOSCYAMINE SULFATE 0.125 MG PO TBDP
0.1250 mg | ORAL_TABLET | Freq: Four times a day (QID) | ORAL | Status: DC | PRN
  Filled 2017-02-14: qty 1

## 2017-02-14 MED ORDER — CHOLESTYRAMINE LIGHT 4 G PO PACK
4.0000 g | PACK | Freq: Two times a day (BID) | ORAL | Status: DC
  Administered 2017-02-14 – 2017-02-15 (×4): 4 g via ORAL
  Filled 2017-02-14 (×4): qty 1

## 2017-02-14 MED ORDER — SIMVASTATIN 20 MG PO TABS
20.0000 mg | ORAL_TABLET | Freq: Every day | ORAL | Status: DC
  Administered 2017-02-14 (×2): 20 mg via ORAL
  Filled 2017-02-14 (×2): qty 1

## 2017-02-14 MED ORDER — DIPHENHYDRAMINE HCL 25 MG PO CAPS
50.0000 mg | ORAL_CAPSULE | Freq: Every day | ORAL | Status: DC
Start: 2017-02-14 — End: 2017-02-15
  Administered 2017-02-14 (×2): 50 mg via ORAL
  Filled 2017-02-14 (×2): qty 2

## 2017-02-14 MED ORDER — POTASSIUM CHLORIDE CRYS ER 20 MEQ PO TBCR
40.0000 meq | EXTENDED_RELEASE_TABLET | Freq: Two times a day (BID) | ORAL | Status: AC
  Administered 2017-02-14 (×2): 40 meq via ORAL
  Filled 2017-02-14 (×2): qty 2

## 2017-02-14 MED ORDER — LORAZEPAM 0.5 MG PO TABS: 0.5000 mg | ORAL_TABLET | ORAL | Status: DC | PRN

## 2017-02-14 MED ORDER — SODIUM CHLORIDE 0.9 % IV SOLN
INTRAVENOUS | Status: DC
  Administered 2017-02-14: 02:00:00 via INTRAVENOUS

## 2017-02-14 MED ORDER — LEVOTHYROXINE SODIUM 50 MCG PO TABS
50.0000 ug | ORAL_TABLET | Freq: Every day | ORAL | Status: DC
  Administered 2017-02-14 – 2017-02-15 (×2): 50 ug via ORAL
  Filled 2017-02-14 (×2): qty 1

## 2017-02-14 MED ORDER — ALBUTEROL SULFATE (2.5 MG/3ML) 0.083% IN NEBU: 2.5000 mg | INHALATION_SOLUTION | Freq: Four times a day (QID) | RESPIRATORY_TRACT | Status: DC | PRN

## 2017-02-14 MED ORDER — VITAMIN B-12 1000 MCG PO TABS
1000.0000 ug | ORAL_TABLET | Freq: Every day | ORAL | Status: DC
  Administered 2017-02-14 – 2017-02-15 (×2): 1000 ug via ORAL
  Filled 2017-02-14 (×2): qty 1

## 2017-02-14 MED ORDER — MAGNESIUM OXIDE 400 (241.3 MG) MG PO TABS
400.0000 mg | ORAL_TABLET | Freq: Every day | ORAL | Status: DC
  Administered 2017-02-14 – 2017-02-15 (×2): 400 mg via ORAL
  Filled 2017-02-14 (×2): qty 1

## 2017-02-14 MED ORDER — CITALOPRAM HYDROBROMIDE 20 MG PO TABS
40.0000 mg | ORAL_TABLET | Freq: Every day | ORAL | Status: DC
  Administered 2017-02-14 – 2017-02-15 (×2): 40 mg via ORAL
  Filled 2017-02-14 (×2): qty 2

## 2017-02-14 MED ORDER — ALBUTEROL SULFATE (2.5 MG/3ML) 0.083% IN NEBU: 3.0000 mL | INHALATION_SOLUTION | Freq: Four times a day (QID) | RESPIRATORY_TRACT | Status: DC | PRN

## 2017-02-14 MED ORDER — MESALAMINE ER 250 MG PO CPCR
1500.0000 mg | ORAL_CAPSULE | ORAL | Status: DC
  Administered 2017-02-14 – 2017-02-15 (×2): 1500 mg via ORAL
  Filled 2017-02-14 (×2): qty 6

## 2017-02-14 MED ORDER — OXYCODONE HCL 5 MG PO TABS: 5.0000 mg | ORAL_TABLET | ORAL | Status: DC | PRN

## 2017-02-14 MED ORDER — RISAQUAD PO CAPS
2.0000 | ORAL_CAPSULE | Freq: Three times a day (TID) | ORAL | Status: DC
  Administered 2017-02-14 – 2017-02-15 (×4): 2 via ORAL
  Filled 2017-02-14 (×4): qty 2

## 2017-02-14 MED ORDER — PANTOPRAZOLE SODIUM 40 MG PO TBEC
40.0000 mg | DELAYED_RELEASE_TABLET | Freq: Every day | ORAL | Status: DC
  Administered 2017-02-14 – 2017-02-15 (×2): 40 mg via ORAL
  Filled 2017-02-14 (×2): qty 1

## 2017-02-14 MED ORDER — ENOXAPARIN SODIUM 40 MG/0.4ML ~~LOC~~ SOLN: 40.0000 mg | SUBCUTANEOUS | Status: DC

## 2017-02-14 NOTE — Progress Notes (Signed)
Lovenox changed to 40 mg BID for BMI >40 and CrCl >30. 

## 2017-02-14 NOTE — Progress Notes (Signed)
Shift assessment completed. Pt is awake, alert and oriented at 0735, incontinent of stool in her brief, then urinated on the clean linen before changing was completed. Pt unsure is she is continent or not, alternately stated that she was continent of urine and incontinent of stool, then incontinent of urine and continent of stool.Pt is on room air,lungs are clear bilat, Hr is regular, abdomen is soft, bs heard and are hyperactive. Pt's bilat lower legs are reddened with blisters scattered over both, and some have opened and are draining. piv #20 intact to rac, iv ns infusing at 34mls/hr,site is free of redness and swelling. Pt has mildly pitting edema to bilat le's. Since assessment, pt has left the unit for ultrasound and returned, Dr. Anselm Jungling has rounded on pt. Dr. Is aware of loose stool, pt has had colace dc'd. Pt is consuming large mounts of po fluids as well.

## 2017-02-14 NOTE — Plan of Care (Signed)
Problem: Bowel/Gastric: Goal: Will not experience complications related to bowel motility Outcome: Progressing Pt has had meds changed due to frequent bm's, continues to eat and drink well, does not attempt to care for herself.

## 2017-02-14 NOTE — Clinical Social Work Placement (Deleted)
   CLINICAL SOCIAL WORK PLACEMENT  NOTE  Date:  02/14/2017  Patient Details  Name: [name] MRN: [id number] Date of Birth: Jun 26, 1971  Clinical Social Work is seeking post-discharge placement for this patient at the [place] level of care (*CSW will initial, date and re-position this form in  chart as items are completed):  Yes   Patient/family provided with [place] Work Department's list of facilities offering this level of care within the geographic area requested by the patient (or if unable, by the patient's family).  Yes   Patient/family informed of their freedom to choose among providers that offer the needed level of care, that participate in Medicare, Medicaid or managed care program needed by the patient, have an available bed and are willing to accept the patient.  Yes   Patient/family informed of [facility]'s ownership interest in [facility] and [facility], as well as of the fact that they are under no obligation to receive care at these facilities.  PASRR submitted to EDS on       PASRR number received on       Existing PASRR number confirmed on 02/14/17     FL2 transmitted to all facilities in geographic area requested by pt/family on 02/14/17     FL2 transmitted to all facilities within larger geographic area on       Patient informed that his/her managed care company has contracts with or will negotiate with certain facilities, including the following:        Yes   Patient/family informed of bed offers received.  Patient chooses bed at  [facility])     Physician recommends and patient chooses bed at      Patient to be transferred to   on  .  Patient to be transferred to facility by       Patient family notified on   of transfer.  Name of family member notified:        PHYSICIAN       Additional Comment:    _______________________________________________ Danie Chandler, [place]  Work 02/14/2017, 11:21 AM

## 2017-02-14 NOTE — Consult Note (Signed)
[facility] Consult   Reason for Consult:  Consult for 46 year old woman with a history of schizophrenia as well as chronic cognitive impairment for her general mental health care in the hospital. Referring Physician:  Marthann Schiller Patient Identification: [name] MRN:  [id number] Principal Diagnosis: Undifferentiated schizophrenia [facility]) Diagnosis:   Patient Active Problem List   Diagnosis Date Noted  . Sepsis secondary to UTI ([place]) [A41.9, N39.0] 02/13/2017  . Hypotension [I95.9] 01/23/2017  . Scalp laceration [S01.01XA]   . Pressure injury of skin [L89.90] 10/02/2016  . Undifferentiated schizophrenia ([place]) [F20.3] 09/30/2016  . Acute renal failure (ARF) ([place]) [N17.9]   . Respiratory failure ([place]) [J96.90] 09/27/2016  . Other sign and symptom in breast [N64.59]     Total Time spent with patient: 45 minutes  Subjective:   [name] is a 46 y.o. female patient admitted with "I've been sick".  HPI:  Patient interviewed chart reviewed. Patient known from previous encounters. Patient is not a very good historian under the best of circumstances. She says that she has not been eating and has not been able to keep food down for several days. She says she has had diarrhea and vomiting. She also has had progressive weakness and feels like she can't stand up. When she tries to she gets weak in the legs. Psychiatrically she has no specific complaints. Doesn't have any specific mood complaint. Vaguely admits to chronic hallucinations but doesn't specify. No suicidal or homicidal ideation. She has been compliant with medicine although she has a paranoid belief that someone was trying to poison her. The intake note suggested that this was related to the risperidone. Patient was not able to state exactly which pill at was that she thought was a poison.  Social history: She has a guardian. Normally resides in a long-term group home but recently has been at [facility] for  rehabilitation after a serious medical illness.  Medical history: Multiple medical problems including Crohn's disease history of traumatic brain injury  Substance abuse history: Does not drink or abuse drugs no substance abuse history  Past Psychiatric History: Long-standing diagnosis of schizophrenia also with chronic cognitive impairment. She had been listed previously as allergic to risperidone. Not able to describe the reaction. Currently however it looks like she is being treated with risperidone without any allergic reaction which makes me suspect that it was probably more of a side effect. When I saw her of couple months ago she was on olanzapine. Current mental status seems to be pretty much at baseline. She has some paranoia but is cooperative with treatment and has not been hostile.  Risk to Self: Is patient at risk for suicide?: No Risk to Others:   Prior Inpatient Therapy:   Prior Outpatient Therapy:    Past Medical History:  Past Medical History:  Diagnosis Date  . Abnormal mammogram, unspecified 2013   Prev. cytology,hypercellular smears without evidence of malignant cells. The cytopathologist questioned if samples truly representative. Care taken during sampling and is felt to be representative.  . Breast screening, unspecified 2013  . Broken leg    age 89  . Cellulitis   . Crohn's disease ([place]) 2013  . Depression   . Diabetes mellitus without complication (HCC)    non insulin dependent  . Early menopause   . Edema   . GERD (gastroesophageal reflux disease)   . Hiatal hernia 2013  . Hyperlipidemia   . Hypertension   . Hypothyroidism   . 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 Obesity, unspecified 2013  . Other sign and symptom in breast 2013   Right bst US,lower outer quadrant,A single 0.3x0.4x0.6cm hypoechoic mass with slightly lobulated borders with adjacent 0.3x0.4x0.5cm mass was noted. The 1st was 5cm from nipple, 2nd at 8 cm from the  nipple. The previous lesion aspirate was at 3 0'clock position. These lesions are thought to account for the mammographic abnormality. Minimal interval change on [place].   [facility])   . Rib fracture    age 52  . Schizophrenia ([place])   . TBI (traumatic brain injury) ([place])   . Thyroid disease    hypothyroid  . Vitamin D deficiency     Past Surgical History:  Procedure Laterality Date  . COLONOSCOPY WITH PROPOFOL N/A 02/16/2016   Procedure: COLONOSCOPY WITH PROPOFOL;  Surgeon: Lollie Sails, MD;  [facility] END[facility];  Service: Endoscopy;  Laterality: N/A;  . COLONOSCOPY WITH PROPOFOL N/A 02/22/2016   Procedure: COLONOSCOPY WITH PROPOFOL;  Surgeon: Lollie Sails, MD;  Location: [facility] ENDOSCOPY;  Service: Endoscopy;  Laterality: N/A;  . COLONOSCOPY WITH PROPOFOL N/A 10/14/2016   Procedure: COLONOSCOPY WITH PROPOFOL;  Surgeon: Jonathon Bellows, MD;  Location: [facility] ENDOSCOPY;  Service: Endoscopy;  Laterality: N/A;  . EYE SURGERY Left 2013   cataract surgery  . PERIPHERAL VASCULAR CATHETERIZATION N/A 10/13/2016   Procedure: Dialysis/Perma Catheter Insertion;  Surgeon: Algernon Huxley, MD;  Location: [place] CV LAB;  Service: Cardiovascular;  Laterality: N/A;  . PERIPHERAL VASCULAR CATHETERIZATION N/A 10/18/2016   Procedure: Dialysis/Perma Catheter Removal;  Surgeon: Katha Cabal, MD;  Location: [place] CV LAB;  Service: Cardiovascular;  Laterality: N/A;  . TUMOR EXCISION Right    age of 50, tumor removed from RUE   Family History:  Family History  Problem Relation Age of Onset  . Cancer Mother 77    colon  . Cancer Paternal Aunt    Family Psychiatric  History: Unknown Social History:  History  Alcohol Use No     History  Drug Use No    Social History   Social History  . Marital status: Single    Spouse name: N/A  . Number of children: N/A  . Years of education: N/A   Social History Main Topics  . Smoking status: Never Smoker  . Smokeless tobacco:  Never Used  . Alcohol use No  . Drug use: No  . Sexual activity: Not Asked   Other Topics Concern  . None   Social History Narrative  . None   Additional Social History:    Allergies:   Allergies  Allergen Reactions  . Peanut Oil Other (See Comments)    Face turns red  . Risperidone And Related Cough    Labs:  Results for orders placed or performed during the hospital encounter of 02/13/17 (from the past 48 hour(s))  Basic metabolic panel     Status: Abnormal   Collection Time: 02/13/17  7:13 PM  Result Value Ref Range   Sodium 127 (L) 135 - 145 mmol/L   Potassium 3.2 (L) 3.5 - 5.1 mmol/L   Chloride 100 (L) 101 - 111 mmol/L   CO2 19 (L) 22 - 32 mmol/L   Glucose, Bld 80 65 - 99 mg/dL   BUN 16 6 - 20 mg/dL   Creatinine, Ser 1.16 (H) 0.44 - 1.00 mg/dL   Calcium 6.8 (L) 8.9 - 10.3 mg/dL   GFR calc non Af Amer 56 (  L) >60 mL/min   GFR calc Af Amer >60 >60 mL/min    Comment: (NOTE) The eGFR has been calculated using the CKD EPI equation. This calculation has not been validated in all clinical situations. eGFR's persistently <60 mL/min signify possible Chronic Kidney Disease.    Anion gap 8 5 - 15  CBC     Status: Abnormal   Collection Time: 02/13/17  7:13 PM  Result Value Ref Range   WBC 16.2 (H) 3.6 - 11.0 K/uL   RBC 3.34 (L) 3.80 - 5.20 MIL/uL   Hemoglobin 10.4 (L) 12.0 - 16.0 g/dL   HCT 31.3 (L) 35.0 - 47.0 %   MCV 93.7 80.0 - 100.0 fL   MCH 31.0 26.0 - 34.0 pg   MCHC 33.1 32.0 - 36.0 g/dL   RDW 15.3 (H) 11.5 - 14.5 %   Platelets 607 (H) 150 - 440 K/uL  TSH     Status: Abnormal   Collection Time: 02/13/17  7:13 PM  Result Value Ref Range   TSH 6.030 (H) 0.350 - 4.500 uIU/mL    Comment: Performed by a 3rd Generation assay with a functional sensitivity of <=0.01 uIU/mL.  Hepatic function panel     Status: Abnormal   Collection Time: 02/13/17  7:13 PM  Result Value Ref Range   Total Protein 4.0 (L) 6.5 - 8.1 g/dL   Albumin <1.0 (L) 3.5 - 5.0 g/dL   AST 18 15  - 41 U/L   ALT 9 (L) 14 - 54 U/L   Alkaline Phosphatase 100 38 - 126 U/L   Total Bilirubin 0.4 0.3 - 1.2 mg/dL   Bilirubin, Direct <0.1 (L) 0.1 - 0.5 mg/dL   Indirect Bilirubin NOT CALCULATED 0.3 - 0.9 mg/dL  Differential     Status: Abnormal   Collection Time: 02/13/17  7:13 PM  Result Value Ref Range   Neutrophils Relative % 38 %   Lymphocytes Relative 50 %   Monocytes Relative 4 %   Eosinophils Relative 0 %   Basophils Relative 0 %   Band Neutrophils 1 %   Metamyelocytes Relative 0 %   Myelocytes 0 %   Promyelocytes Absolute 0 %   Blasts 0 %   nRBC 0 0 /100 WBC   Other 7 %   Neutro Abs 6.3 1.4 - 6.5 K/uL   Lymphs Abs 8.1 (H) 1.0 - 3.6 K/uL   Monocytes Absolute 0.6 0.2 - 0.9 K/uL   Eosinophils Absolute 0.0 0 - 0.7 K/uL   Basophils Absolute 0.0 0 - 0.1 K/uL   WBC Morphology SMUDGE CELLS     Comment: VACUOLATED NEUTROPHILS GIANT PLATELETS SEEN ATYPICAL LYMPHOCYTES    Smear Review      PLATELET CLUMPS NOTED ON SMEAR, COUNT APPEARS ADEQUATE    Comment: MORPHOLOGY UNREMARKABLE  Lactic acid, plasma     Status: None   Collection Time: 02/13/17  7:28 PM  Result Value Ref Range   Lactic Acid, Venous 1.0 0.5 - 1.9 mmol/L  Urinalysis, Complete w Microscopic     Status: Abnormal   Collection Time: 02/13/17  8:26 PM  Result Value Ref Range   Color, Urine YELLOW (A) YELLOW   APPearance HAZY (A) CLEAR   Specific Gravity, Urine 1.002 (L) 1.005 - 1.030   pH 6.0 5.0 - 8.0   Glucose, UA NEGATIVE NEGATIVE mg/dL   Hgb urine dipstick NEGATIVE NEGATIVE   Bilirubin Urine NEGATIVE NEGATIVE   Ketones, ur NEGATIVE NEGATIVE mg/dL   Protein, ur NEGATIVE NEGATIVE mg/dL  Nitrite NEGATIVE NEGATIVE   Leukocytes, UA LARGE (A) NEGATIVE   RBC / HPF 0-5 0 - 5 RBC/hpf   WBC, UA 6-30 0 - 5 WBC/hpf   Bacteria, UA RARE (A) NONE SEEN   Squamous Epithelial / LPF 0-5 (A) NONE SEEN   Mucous PRESENT   MRSA PCR Screening     Status: None   Collection Time: 02/14/17  2:25 AM  Result Value Ref Range    MRSA by PCR NEGATIVE NEGATIVE    Comment:        The GeneXpert MRSA Assay (FDA approved for NASAL specimens only), is one component of a comprehensive MRSA colonization surveillance program. It is not intended to diagnose MRSA infection nor to guide or monitor treatment for MRSA infections.   Fibrin derivatives D-Dimer ([facility] only)     Status: Abnormal   Collection Time: 02/14/17  2:50 AM  Result Value Ref Range   Fibrin derivatives D-dimer ([facility]) 668.46 (H) 0.00 - 499.00    Comment: (NOTE) <> Exclusion of Venous Thromboembolism (VTE) - OUTPATIENT ONLY   (Emergency Department or [place])   0-499 ng/ml (FEU): With a low to intermediate pretest probability                      for VTE this test result excludes the diagnosis                      of VTE.   >499 ng/ml (FEU) : VTE not excluded; additional work up for VTE is                      required. <> Testing on Inpatients and Evaluation of Disseminated Intravascular   Coagulation (DIC) Reference Range:   0-499 ng/ml (FEU)   Procalcitonin     Status: None   Collection Time: 02/14/17  2:50 AM  Result Value Ref Range   Procalcitonin 0.16 ng/mL    Comment:        Interpretation: PCT (Procalcitonin) <= 0.5 ng/mL: Systemic infection (sepsis) is not likely. Local bacterial infection is possible. (NOTE)         ICU PCT Algorithm               Non ICU PCT Algorithm    ----------------------------     ------------------------------         PCT < 0.25 ng/mL                 PCT < 0.1 ng/mL     Stopping of antibiotics            Stopping of antibiotics       strongly encouraged.               strongly encouraged.    ----------------------------     ------------------------------       PCT level decrease by               PCT < 0.25 ng/mL       >= 80% from peak PCT       OR PCT 0.25 - 0.5 ng/mL          Stopping of antibiotics                                             encouraged.  Stopping of antibiotics           encouraged.     ----------------------------     ------------------------------       PCT level decrease by              PCT >= 0.25 ng/mL       < 80% from peak PCT        AND PCT >= 0.5 ng/mL            Continuin g antibiotics                                              encouraged.       Continuing antibiotics            encouraged.    ----------------------------     ------------------------------     PCT level increase compared          PCT > 0.5 ng/mL         with peak PCT AND          PCT >= 0.5 ng/mL             Escalation of antibiotics                                          strongly encouraged.      Escalation of antibiotics        strongly encouraged.   Protime-INR     Status: None   Collection Time: 02/14/17  2:50 AM  Result Value Ref Range   Prothrombin Time 14.8 11.4 - 15.2 seconds   INR 1.15   APTT     Status: Abnormal   Collection Time: 02/14/17  2:50 AM  Result Value Ref Range   aPTT 44 (H) 24 - 36 seconds    Comment:        IF BASELINE aPTT IS ELEVATED, SUGGEST PATIENT RISK ASSESSMENT BE USED TO DETERMINE APPROPRIATE ANTICOAGULANT THERAPY.   Basic metabolic panel     Status: Abnormal   Collection Time: 02/14/17  2:50 AM  Result Value Ref Range   Sodium 130 (L) 135 - 145 mmol/L   Potassium 3.0 (L) 3.5 - 5.1 mmol/L   Chloride 101 101 - 111 mmol/L   CO2 22 22 - 32 mmol/L   Glucose, Bld 83 65 - 99 mg/dL   BUN 17 6 - 20 mg/dL   Creatinine, Ser 1.24 (H) 0.44 - 1.00 mg/dL   Calcium 6.7 (L) 8.9 - 10.3 mg/dL   GFR calc non Af Amer 52 (L) >60 mL/min   GFR calc Af Amer 60 (L) >60 mL/min    Comment: (NOTE) The eGFR has been calculated using the CKD EPI equation. This calculation has not been validated in all clinical situations. eGFR's persistently <60 mL/min signify possible Chronic Kidney Disease.    Anion gap 7 5 - 15  CBC     Status: Abnormal   Collection Time: 02/14/17  2:50 AM  Result Value Ref Range   WBC 13.8 (H) 3.6 - 11.0 K/uL   RBC 2.97 (L) 3.80 - 5.20 MIL/uL    Hemoglobin 9.4 (L) 12.0 - 16.0 g/dL   HCT 28.2 (L) 35.0 - 47.0 %   MCV 94.9 80.0 - 100.0  fL   MCH 31.7 26.0 - 34.0 pg   MCHC 33.4 32.0 - 36.0 g/dL   RDW 15.0 (H) 11.5 - 14.5 %   Platelets 652 (H) 150 - 440 K/uL  Magnesium     Status: None   Collection Time: 02/14/17  2:50 AM  Result Value Ref Range   Magnesium 1.8 1.7 - 2.4 mg/dL  Phosphorus     Status: None   Collection Time: 02/14/17  2:50 AM  Result Value Ref Range   Phosphorus 3.5 2.5 - 4.6 mg/dL    Current Facility-Administered Medications  Medication Dose Route Frequency Provider Last Rate Last Dose  . acetaminophen (TYLENOL) tablet 650 mg  650 mg Oral Q6H PRN Alexis Hugelmeyer, DO      . acidophilus (RISAQUAD) capsule 2 capsule  2 capsule Oral TID Alexis Hugelmeyer, DO   2 capsule at 02/14/17 1647  . albuterol (PROVENTIL) (2.5 MG/3ML) 0.083% nebulizer solution 2.5 mg  2.5 mg Nebulization Q6H PRN Alexis Hugelmeyer, DO      . albuterol (PROVENTIL) (2.5 MG/3ML) 0.083% nebulizer solution 3 mL  3 mL Inhalation QID PRN Alexis Hugelmeyer, DO      . bisacodyl (DULCOLAX) EC tablet 5 mg  5 mg Oral Daily PRN Alexis Hugelmeyer, DO      . calcium carbonate (TUMS - dosed in mg elemental calcium) chewable tablet 600 mg of elemental calcium  3 tablet Oral BID WC Alexis Hugelmeyer, DO   600 mg of elemental calcium at 02/14/17 1647  . ceFEPIme (MAXIPIME) 2 g in dextrose 5 % 50 mL IVPB  2 g Intravenous Q12H Alexis Hugelmeyer, DO   Stopped at 02/14/17 1422  . cholecalciferol (VITAMIN D) tablet 5,000 Units  5,000 Units Oral Daily Alexis Hugelmeyer, DO   5,000 Units at 02/14/17 1102  . cholestyramine light (PREVALITE) packet 4 g  4 g Oral BID Alexis Hugelmeyer, DO   4 g at 02/14/17 1100  . citalopram (CELEXA) tablet 40 mg  40 mg Oral Daily Alexis Hugelmeyer, DO   40 mg at 02/14/17 1101  . diphenhydrAMINE (BENADRYL) capsule 50 mg  50 mg Oral QHS Alexis Hugelmeyer, DO   50 mg at 02/14/17 0216  . enoxaparin (LOVENOX) injection 40 mg  40 mg  Subcutaneous BID Alexis Hugelmeyer, DO   40 mg at 02/14/17 1100  . feeding supplement (PRO-STAT SUGAR FREE 64) liquid 30 mL  30 mL Oral TID WC Alexis Hugelmeyer, DO   30 mL at 02/14/17 1647  . gemfibrozil (LOPID) tablet 600 mg  600 mg Oral BID Alexis Hugelmeyer, DO   600 mg at 02/14/17 1102  . hyoscyamine (ANASPAZ) disintergrating tablet 0.125 mg  0.125 mg Sublingual Q6H PRN Alexis Hugelmeyer, DO      . ipratropium (ATROVENT) nebulizer solution 0.5 mg  0.5 mg Nebulization Q6H PRN Alexis Hugelmeyer, DO      . levothyroxine (SYNTHROID, LEVOTHROID) tablet 50 mcg  50 mcg Oral QAC breakfast Alexis Hugelmeyer, DO   50 mcg at 02/14/17 0753  . LORazepam (ATIVAN) tablet 0.5 mg  0.5 mg Oral Q4H PRN Alexis Hugelmeyer, DO      . LORazepam (ATIVAN) tablet 1 mg  1 mg Oral BH-q7a Alexis Hugelmeyer, DO   1 mg at 02/14/17 0754  . magnesium oxide (MAG-OX) tablet 400 mg  400 mg Oral Daily Alexis Hugelmeyer, DO   400 mg at 02/14/17 1102  . mesalamine (PENTASA) CR capsule 1,500 mg  1,500 mg Oral BH-q7a Alexis Hugelmeyer, DO   1,500 mg at 02/14/17 0754  .  midodrine (PROAMATINE) tablet 10 mg  10 mg Oral TID WC Alexis Hugelmeyer, DO   10 mg at 02/14/17 1647  . multivitamin with minerals tablet 1 tablet  1 tablet Oral Daily Alexis Hugelmeyer, DO   1 tablet at 02/14/17 1101  . omega-3 acid ethyl esters (LOVAZA) capsule 2 g  2 g Oral BH-q7a Alexis Hugelmeyer, DO   2 g at 02/14/17 0753  . ondansetron (ZOFRAN) tablet 4 mg  4 mg Oral Q6H PRN Alexis Hugelmeyer, DO       Or  . ondansetron (ZOFRAN) injection 4 mg  4 mg Intravenous Q6H PRN Alexis Hugelmeyer, DO      . oxyCODONE (Oxy IR/ROXICODONE) immediate release tablet 5 mg  5 mg Oral Q4H PRN Alexis Hugelmeyer, DO      . pantoprazole (PROTONIX) EC tablet 40 mg  40 mg Oral Daily Alexis Hugelmeyer, DO   40 mg at 02/14/17 1100  . [START ON 02/15/2017] potassium chloride SA (K-DUR,KLOR-CON) CR tablet 20 mEq  20 mEq Oral BID Alexis Hugelmeyer, DO      . simvastatin (ZOCOR) tablet 20  mg  20 mg Oral QHS Alexis Hugelmeyer, DO   20 mg at 02/14/17 0216  . vitamin B-12 (CYANOCOBALAMIN) tablet 1,000 mcg  1,000 mcg Oral Daily Alexis Hugelmeyer, DO   1,000 mcg at 02/14/17 1102  . vitamin C (ASCORBIC ACID) tablet 500 mg  500 mg Oral BID Alexis Hugelmeyer, DO   500 mg at 02/14/17 1102    Musculoskeletal: Strength & Muscle Tone: decreased Gait & Station: unable to stand Patient leans: N/A  Psychiatric Specialty Exam: Physical Exam  Nursing note and vitals reviewed. Constitutional: She appears well-developed and well-nourished.  HENT:  Head: Normocephalic and atraumatic.  Eyes: Conjunctivae are normal. Pupils are equal, round, and reactive to light.  Neck: Normal range of motion.  Cardiovascular: Regular rhythm and normal heart sounds.   Respiratory: Effort normal.  GI: Soft.  Musculoskeletal: Normal range of motion.  Neurological: She is alert.  Skin: Skin is warm and dry.  Psychiatric: Her affect is blunt. Her speech is delayed. She is slowed and withdrawn. Thought content is paranoid. Cognition and memory are impaired. She expresses impulsivity. She expresses no suicidal ideation.    Review of Systems  Constitutional: Positive for malaise/fatigue.  HENT: Negative.   Eyes: Negative.   Respiratory: Negative.   Cardiovascular: Negative.   Gastrointestinal: Positive for diarrhea and nausea.  Musculoskeletal: Negative.   Skin: Negative.   Neurological: Positive for focal weakness.  Psychiatric/Behavioral: Positive for hallucinations and memory loss. Negative for depression, substance abuse and suicidal ideas. The patient is not nervous/anxious and does not have insomnia.     Blood pressure (!) 74/40, pulse 98, temperature 98.6 F (37 C), temperature source Oral, resp. rate 18, height _0  (1.676 m), weight 123.7 kg (272 lb 12.8 oz), SpO2 100 %.Body mass index is 44.03 kg/m.  General Appearance: Guarded  Eye Contact:  Good  Speech:  Slow  Volume:  Decreased  Mood:   Euthymic  Affect:  Blunt  Thought Process:  Disorganized  Orientation:  Full (Time, Place, and Person)  Thought Content:  Illogical and Paranoid Ideation  Suicidal Thoughts:  No  Homicidal Thoughts:  No  Memory:  Immediate;   Fair Recent;   Poor Remote;   Fair  Judgement:  Impaired  Insight:  Lacking  Psychomotor Activity:  Decreased  Concentration:  Concentration: Fair  Recall:  [employer] of Knowledge:  Poor  Language:  Fair  Akathisia:  No  Handed:  Right  AIMS (if indicated):     Assets:  Financial Resources/Insurance Housing Social Support  ADL's:  Impaired  Cognition:  Impaired,  Moderate  Sleep:        Treatment Plan Summary: Daily contact with patient to assess and evaluate symptoms and progress in treatment, Medication management and Plan Patient appears to be pretty much at her baseline. Has been cooperative with treatment. She does have chronic cognitive impairment and is very slow and not a reliable historian. She seems to be tolerating her current medicine. I'm not sure why they made the switch from the olanzapine to risperidone but I'm not going to make any further changes in her medicine right now. I will continue to follow up while she is in the hospital.  Disposition: Patient does not meet criteria for psychiatric inpatient admission. Supportive therapy provided about ongoing stressors.  Alethia Berthold, MD 02/14/2017 4:59 PM

## 2017-02-14 NOTE — Evaluation (Signed)
Physical Therapy Evaluation Patient Details Name: [name] MRN: [id number] DOB: 01/01/1971 Today's Date: 02/14/2017   History of Present Illness  Pt is a 46 y.o. female presenting to hospital from STR at [facility] with weakness.  Pt admitted with sepsis 2/2 UTI, weakness secondary to hyponatremia, and hypokalemia.  PMH includes idiopathic hypotension, DM, depression, htn, R eye mass, Crohn's disease, hiatal hernia, TBI, and schizophrenia.  Clinical Impression  Prior to recent hospital admissions, pt was ambulatory with rollator.  Pt lives at a group home but has recently been at [facility] receiving rehab.  Currently pt is mod assist with logrolling in bed L and R.  Pt declined further mobility d/t reporting having bowel incontinence every time she moves (nursing reporting multiple episodes of bowel incontinence today).  Pt's BP also noted to be 76/49 laying supine in bed (pt asymptomatic; nursing aware of pt's trending low BP).  Pt appearing deconditioned in bed with overall generalized weakness.  Recommend pt discharge back to STR when medically appropriate.    Follow Up Recommendations SNF    Equipment Recommendations  Rolling walker with 5" wheels    Recommendations for Other Services       Precautions / Restrictions Precautions Precautions: Fall Restrictions Weight Bearing Restrictions: No      Mobility  Bed Mobility Overal bed mobility: Needs Assistance Bed Mobility: Rolling Rolling: Mod assist         General bed mobility comments: logrolling to L and R in bed with use of bed rail; pt able to initiate logroll B but required mod assist at hips to continue logroll onto pt's side  Transfers                 General transfer comment: Pt declined OOB mobility d/t reporting everytime she moves she has bowel incontinence.  Ambulation/Gait             General Gait Details: Pt declined OOB mobility d/t reporting everytime she moves she has bowel  incontinence.  Stairs            Wheelchair Mobility    Modified Rankin (Stroke Patients Only)       Balance                                             Pertinent Vitals/Pain Pain Assessment: No/denies pain  HR and O2 on room air WFL.    Home Living Family/patient expects to be discharged to:: Group home                 Additional Comments: Per recent last PT eval: pt lives at [facility]; pt reports having 24 hr assistance for mobility and medications and pt uses a Printmaker service to take her to and from school; Pt reports 1 step w/o rails to enter the Group home; Pt reports that the bathroom is accessible for her with her rollator and she uses a high toilet w/ grab bars and a tub shower with a grab bar on the L.    Prior Function Level of Independence: Needs assistance   Gait / Transfers Assistance Needed: Per recent last PT eval, pt reports that she ambulates with a rollator in the group home and in the community and is provided assistance with mobility in and out of bed.  Hand Dominance        Extremity/Trunk Assessment   Upper Extremity Assessment Upper Extremity Assessment: Generalized weakness    Lower Extremity Assessment Lower Extremity Assessment: Generalized weakness       Communication   Communication: No difficulties  Cognition Arousal/Alertness: Lethargic Behavior During Therapy: WFL for tasks assessed/performed Overall Cognitive Status: Within Functional Limits for tasks assessed                                 General Comments: Pt oriented to person, place, and situation but not date.      General Comments General comments (skin integrity, edema, etc.): Significant B LE swelling noted.  Nursing cleared pt for participation in physical therapy (reports pt with low BP but asymptomatic).  Pt agreeable to limited PT session.    Exercises     Assessment/Plan    PT Assessment  Patient needs continued PT services  PT Problem List Decreased strength;Decreased activity tolerance;Decreased mobility       PT Treatment Interventions DME instruction;Therapeutic activities;Gait training;Therapeutic exercise;Patient/family education;Balance training;Stair training;Functional mobility training    PT Goals (Current goals can be found in the Care Plan section)  Acute Rehab PT Goals Patient Stated Goal: To be able to walk again. PT Goal Formulation: With patient Time For Goal Achievement: 02/28/17 Potential to Achieve Goals: Fair    Frequency Min 2X/week   Barriers to discharge Decreased caregiver support Level of assist available at group home.    Co-evaluation               End of Session   Activity Tolerance: Patient limited by fatigue;Other (comment) (Limited d/t bowel incontinence) Patient left: in bed;with call bell/phone within reach;with bed alarm set;with nursing/sitter in room Nurse Communication: Mobility status;Precautions (Pt's BP) PT Visit Diagnosis: Muscle weakness (generalized) (M62.81);Difficulty in walking, not elsewhere classified (R26.2);History of falling (Z91.81)    Time: 6962-9528 PT Time Calculation (min) (ACUTE ONLY): 11 min   Charges:   PT Evaluation $PT Eval Low Complexity: 1 Procedure     PT G CodesLeitha Bleak, PT 02/14/17, 3:24 PM [phone]

## 2017-02-14 NOTE — NC FL2 (Signed)
[place] LEVEL OF CARE SCREENING TOOL     IDENTIFICATION  Patient Name: [name] Birthdate: 1971/04/09 Sex: female Admission Date (Current Location): 02/13/2017  [place] and [place] Number:  Engineering geologist and Address:  [facility], [address] [place], [place] [address]      Provider Number: [id number]  Attending Physician Name and Address:  Vaughan Basta, MD  Relative Name and Phone Number:       Current Level of Care: Hospital Recommended Level of Care: May Prior Approval Number:    Date Approved/Denied: 02/24/17 PASRR Number:  [id number])  Discharge Plan: SNF    Current Diagnoses: Patient Active Problem List   Diagnosis Date Noted  . Sepsis secondary to UTI ([place]) 02/13/2017  . Hypotension 01/23/2017  . Scalp laceration   . Pressure injury of skin 10/02/2016  . 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 Acute renal failure (ARF) ([place])   . Respiratory failure ([place]) 09/27/2016  . Other sign and symptom in breast     Orientation RESPIRATION BLADDER Height & Weight     Self, Time, Situation, Place  Normal Continent Weight: 272 lb 12.8 oz (123.7 kg) Height:  5\' 6"  (167.6 cm)  BEHAVIORAL SYMPTOMS/MOOD NEUROLOGICAL BOWEL NUTRITION STATUS   (None. ) Convulsions/Seizures (None) Continent Diet ([place]: Heart Room )  AMBULATORY STATUS COMMUNICATION OF NEEDS Skin   Extensive Assist Verbally Normal                       Personal Care Assistance Level of Assistance  Bathing, Feeding, Dressing Bathing Assistance: Limited assistance Feeding assistance: Independent Dressing Assistance: Limited assistance     Functional Limitations Info  Sight, Hearing, Speech Sight Info: Adequate Hearing Info: Adequate Speech Info: Adequate    SPECIAL CARE FACTORS FREQUENCY  PT (By licensed PT), OT (By licensed OT)     PT Frequency:  (5) OT Frequency:  (5)             Contractures      Additional Factors Info  Code Status, Allergies Code Status Info:  (Full Code) Allergies Info:  (Peanut Oil, Risperidone And Related)           Current Medications (02/14/2017):  This is the current hospital active medication list Current Facility-Administered Medications  Medication Dose Route Frequency Provider Last Rate Last Dose  . 0.9 %  sodium chloride infusion   Intravenous Continuous Alexis Hugelmeyer, DO 75 mL/hr at 02/14/17 0139    . acetaminophen (TYLENOL) tablet 650 mg  650 mg Oral Q6H PRN Alexis Hugelmeyer, DO      . acidophilus (RISAQUAD) capsule 2 capsule  2 capsule Oral TID Alexis Hugelmeyer, DO      . albuterol (PROVENTIL) (2.5 MG/3ML) 0.083% nebulizer solution 2.5 mg  2.5 mg Nebulization Q6H PRN Alexis Hugelmeyer, DO      . albuterol (PROVENTIL) (2.5 MG/3ML) 0.083% nebulizer solution 3 mL  3 mL Inhalation QID PRN Alexis Hugelmeyer, DO      . bisacodyl (DULCOLAX) EC tablet 5 mg  5 mg Oral Daily PRN Alexis Hugelmeyer, DO      . calcium carbonate (TUMS - dosed in mg elemental calcium) chewable tablet 600 mg of elemental calcium  3 tablet Oral BID WC Alexis Hugelmeyer, DO   600 mg of elemental calcium at 02/14/17 0754  . ceFEPIme (MAXIPIME) 2 g in dextrose 5 % 50 mL IVPB  2 g Intravenous Q12H Alexis Hugelmeyer, DO      .  cholecalciferol (VITAMIN D) tablet 5,000 Units  5,000 Units Oral Daily Alexis Hugelmeyer, DO      . cholestyramine light (PREVALITE) packet 4 g  4 g Oral BID Alexis Hugelmeyer, DO   4 g at 02/14/17 0216  . citalopram (CELEXA) tablet 40 mg  40 mg Oral Daily Alexis Hugelmeyer, DO      . diphenhydrAMINE (BENADRYL) capsule 50 mg  50 mg Oral QHS Alexis Hugelmeyer, DO   50 mg at 02/14/17 0216  . docusate sodium (COLACE) capsule 100 mg  100 mg Oral BID Alexis Hugelmeyer, DO   100 mg at 02/14/17 0216  . enoxaparin (LOVENOX) injection 40 mg  40 mg Subcutaneous BID Alexis Hugelmeyer, DO      . feeding supplement (PRO-STAT SUGAR FREE 64) liquid  30 mL  30 mL Oral TID WC Alexis Hugelmeyer, DO   30 mL at 02/14/17 0753  . gemfibrozil (LOPID) tablet 600 mg  600 mg Oral BID Alexis Hugelmeyer, DO   600 mg at 02/14/17 0216  . hyoscyamine (ANASPAZ) disintergrating tablet 0.125 mg  0.125 mg Sublingual Q6H PRN Alexis Hugelmeyer, DO      . ipratropium (ATROVENT) nebulizer solution 0.5 mg  0.5 mg Nebulization Q6H PRN Alexis Hugelmeyer, DO      . levothyroxine (SYNTHROID, LEVOTHROID) tablet 50 mcg  50 mcg Oral QAC breakfast Alexis Hugelmeyer, DO   50 mcg at 02/14/17 0753  . LORazepam (ATIVAN) tablet 0.5 mg  0.5 mg Oral Q4H PRN Alexis Hugelmeyer, DO      . LORazepam (ATIVAN) tablet 1 mg  1 mg Oral BH-q7a Alexis Hugelmeyer, DO   1 mg at 02/14/17 0754  . magnesium oxide (MAG-OX) tablet 400 mg  400 mg Oral Daily Alexis Hugelmeyer, DO      . mesalamine (PENTASA) CR capsule 1,500 mg  1,500 mg Oral BH-q7a Alexis Hugelmeyer, DO   1,500 mg at 02/14/17 0754  . midodrine (PROAMATINE) tablet 10 mg  10 mg Oral TID WC Alexis Hugelmeyer, DO   10 mg at 02/14/17 0754  . multivitamin with minerals tablet 1 tablet  1 tablet Oral Daily Alexis Hugelmeyer, DO      . omega-3 acid ethyl esters (LOVAZA) capsule 2 g  2 g Oral BH-q7a Alexis Hugelmeyer, DO   2 g at 02/14/17 0753  . ondansetron (ZOFRAN) tablet 4 mg  4 mg Oral Q6H PRN Alexis Hugelmeyer, DO       Or  . ondansetron (ZOFRAN) injection 4 mg  4 mg Intravenous Q6H PRN Alexis Hugelmeyer, DO      . oxyCODONE (Oxy IR/ROXICODONE) immediate release tablet 5 mg  5 mg Oral Q4H PRN Alexis Hugelmeyer, DO      . pantoprazole (PROTONIX) EC tablet 40 mg  40 mg Oral Daily Alexis Hugelmeyer, DO      . [START ON 02/15/2017] potassium chloride SA (K-DUR,KLOR-CON) CR tablet 20 mEq  20 mEq Oral BID Alexis Hugelmeyer, DO      . potassium chloride SA (K-DUR,KLOR-CON) CR tablet 40 mEq  40 mEq Oral BID Alexis Hugelmeyer, DO   40 mEq at 02/14/17 0216  . simvastatin (ZOCOR) tablet 20 mg  20 mg Oral QHS Alexis Hugelmeyer, DO   20 mg at 02/14/17  0216  . vitamin B-12 (CYANOCOBALAMIN) tablet 1,000 mcg  1,000 mcg Oral Daily Alexis Hugelmeyer, DO      . vitamin C (ASCORBIC ACID) tablet 500 mg  500 mg Oral BID Alexis Hugelmeyer, DO   500 mg at 02/14/17 0216  Discharge Medications: Please see discharge summary for a list of discharge medications.  Relevant Imaging Results:  Relevant Lab Results:   Additional Information  (SSN: [id number])  Danie Chandler, Student-Social Work

## 2017-02-14 NOTE — Progress Notes (Signed)
Pharmacy Antibiotic Note  [name] is a 46 y.o. female admitted on 02/13/2017 with sepsis and UTI.  Pharmacy has been consulted for cefepime dosing.  Plan: Cefepime 2 grams q 12 hours ordered  Height: 5\' 6"  (167.6 cm) Weight: 272 lb 12.8 oz (123.7 kg) IBW/kg (Calculated) : 59.3  Temp (24hrs), Avg:97.7 F (36.5 C), Min:97.4 F (36.3 C), Max:98 F (36.7 C)   Recent Labs Lab 02/13/17 1913 02/13/17 1928 02/14/17 0250  WBC 16.2*  --   --   CREATININE 1.16*  --  1.24*  LATICACIDVEN  --  1.0  --     Estimated Creatinine Clearance: 77 mL/min (A) (by C-G formula based on SCr of 1.24 mg/dL (H)).    Allergies  Allergen Reactions  . Peanut Oil Other (See Comments)    Face turns red  . Risperidone And Related Cough    Antimicrobials this admission: cefepime 4/17 >>    >>   Dose adjustments this admission:   Microbiology results: 4/17 MRSA PCR: pending      4/16 UA: LE(+) NO2(-) WBC 6-30  Thank you for allowing pharmacy to be a part of this patient's care.  Halsey Persaud S 02/14/2017 3:33 AM

## 2017-02-14 NOTE — Clinical Social Work Note (Addendum)
Clinical Social Work Assessment  Patient Details  Name: [name] MRN: [id number] Date of Birth: 01/03/1971  Date of referral:  02/14/17               Reason for consult:  Facility Placement, Discharge Planning                Permission sought to share information with:  Chartered certified accountant granted to share information::  Yes, Verbal Permission Granted  Name::      [facility]  Agency::   [facility]   Relationship::     Contact Information:     Housing/Transportation Living arrangements for the past 2 months:  Group Home ([facility]) Source of Information:  Patient, Guardian Patient Interpreter Needed:  None Criminal Activity/Legal Involvement Pertinent to Current Situation/Hospitalization:  No - Comment as needed Significant Relationships:  Other Family Members Lives with:  Facility Resident ([facility]) Do you feel safe going back to the place where you live?  Yes Need for family participation in patient care:  Yes (Comment)  Care giving concerns: Patient is a resident at [facility] group home in [place] but is currently at [facility] for short-term rehab.    Social Worker assessment / plan:  Social work Theatre manager noted in patient's chart that patient was from facility. Social work Theatre manager met with patient alone at beside. Patient was alert and oriented x4 and was laying in bed. Social work Theatre manager introduced self and explained role of social work department. Per patient, she is a resident at [facility] in [place], but is at [facility] now for short-term rehab. Patient verbally agreed she wanted to return to [facility] for short-term rehab. Patient has Medicare and was inpatient 02/13/17. Patient has a HPOA. Patient's aunt Lesle Reek is her HPOA. Social work Administrator, arts and confirmed information. Barbaraann Share was agreeable for patient to return to [facility] for short-term rehab and then go back to [facility]. Broadus John, [facility] Liaison is aware of above and verbally agreed patient can return to continue short-term rehab. Plan is for patient to return to [facility] and then to [facility] after short-term rehab. Social work Theatre manager will continue to assist and follow as needed.   FL2 completed and faxed out.   Employment status:  Disabled (Comment on whether or not currently receiving Disability) Insurance information:  Medicare PT Recommendations:  Not assessed at this time Information / Referral to community resources:  [place]  Patient/Family's Response to care:  Patient and Barbaraann Share verbally agreed for patient to return to [facility] to finish short-term rehab.   Patient/Family's Understanding of and Emotional Response to Diagnosis, Current Treatment, and Prognosis:  Patient and patient's HPOA were pleasant and thanked social work Theatre manager for visiting.   Emotional Assessment Appearance:  Appears stated age Attitude/Demeanor/Rapport:    Affect (typically observed):  Accepting, Adaptable, Appropriate Orientation:  Oriented to Self, Oriented to Place, Oriented to  Time, Oriented to Situation Alcohol / Substance use:  Not Applicable Psych involvement (Current and /or in the community):  No (Comment)  Discharge Needs  Concerns to be addressed:  Basic Needs Readmission within the last 30 days:    Current discharge risk:  Dependent with Mobility Barriers to Discharge:  Continued Medical Work up   [employer], [place] Work 02/14/2017, 11:07 AM

## 2017-02-15 LAB — BASIC METABOLIC PANEL
Anion gap: 5 (ref 5–15)
BUN: 14 mg/dL (ref 6–20)
CHLORIDE: 103 mmol/L (ref 101–111)
CO2: 22 mmol/L (ref 22–32)
CREATININE: 1.04 mg/dL — AB (ref 0.44–1.00)
Calcium: 7 mg/dL — ABNORMAL LOW (ref 8.9–10.3)
GFR calc Af Amer: >60 mL/min (ref >60)
GFR calc non Af Amer: >60 mL/min (ref >60)
Glucose, Bld: 71 mg/dL (ref 65–99)
Potassium: 3.2 mmol/L — ABNORMAL LOW (ref 3.5–5.1)
SODIUM: 130 mmol/L — AB (ref 135–145)

## 2017-02-15 LAB — CBC
HEMATOCRIT: 32.6 % — AB (ref 35.0–47.0)
HEMOGLOBIN: 10.8 g/dL — AB (ref 12.0–16.0)
MCH: 30.6 pg (ref 26.0–34.0)
MCHC: 32.9 g/dL (ref 32.0–36.0)
MCV: 93 fL (ref 80.0–100.0)
Platelets: 639 10*3/uL — ABNORMAL HIGH (ref 150–440)
RBC: 3.51 MIL/uL — ABNORMAL LOW (ref 3.80–5.20)
RDW: 15.1 % — ABNORMAL HIGH (ref 11.5–14.5)
WBC: 14.4 10*3/uL — ABNORMAL HIGH (ref 3.6–11.0)

## 2017-02-15 MED ORDER — LEVOTHYROXINE SODIUM 75 MCG PO TABS: 75.0000 ug | ORAL_TABLET | Freq: Every day | ORAL | 0 refills | Status: DC

## 2017-02-15 MED ORDER — RISAQUAD PO CAPS: 2.0000 | ORAL_CAPSULE | Freq: Three times a day (TID) | ORAL | 0 refills | Status: DC

## 2017-02-15 MED ORDER — CEFUROXIME AXETIL 250 MG PO TABS: 250.0000 mg | ORAL_TABLET | Freq: Two times a day (BID) | ORAL | 0 refills | Status: AC

## 2017-02-15 MED ORDER — DOCUSATE SODIUM 100 MG PO CAPS
100.0000 mg | ORAL_CAPSULE | Freq: Two times a day (BID) | ORAL | 0 refills | Status: DC | PRN
Start: 2017-02-15 — End: 2017-03-02

## 2017-02-15 MED ORDER — LEVOTHYROXINE SODIUM 75 MCG PO TABS: 75.0000 ug | ORAL_TABLET | Freq: Every day | ORAL | Status: DC

## 2017-02-15 NOTE — NC FL2 (Signed)
[place] LEVEL OF CARE SCREENING TOOL     IDENTIFICATION  Patient Name: [name] Birthdate: [birth year] Sex: female Admission Date (Current Location): 02/13/2017  [name] and [place] Number:  Engineering geologist and Address:  [facility], [address] [place],  [address]      Provider Number: [id number]  Attending Physician Name and Address:  Vaughan Basta, MD  Relative Name and Phone Number:       Current Level of Care: Hospital Recommended Level of Care: University Prior Approval Number:    Date Approved/Denied: 02/24/17 PASRR Number:  [id number])  Discharge Plan: SNF    Current Diagnoses: Patient Active Problem List   Diagnosis Date Noted  . Sepsis secondary to UTI ([place]) 02/13/2017  . Hypotension 01/23/2017  . Scalp laceration   . Pressure injury of skin 10/02/2016  . 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 Acute renal failure (ARF) ([place])   . Respiratory failure ([place]) 09/27/2016  . Other sign and symptom in breast     Orientation RESPIRATION BLADDER Height & Weight     Self, Time, Situation, Place  Normal Continent Weight: 272 lb 12.8 oz (123.7 kg) Height:  5\' 6"  (167.6 cm)  BEHAVIORAL SYMPTOMS/MOOD NEUROLOGICAL BOWEL NUTRITION STATUS   (None. ) Convulsions/Seizures (None) Continent Diet ([place]: Heart Room )  AMBULATORY STATUS COMMUNICATION OF NEEDS Skin   Extensive Assist Verbally Normal                       Personal Care Assistance Level of Assistance  Bathing, Feeding, Dressing Bathing Assistance: Limited assistance Feeding assistance: Independent Dressing Assistance: Limited assistance     Functional Limitations Info  Sight, Hearing, Speech Sight Info: Adequate Hearing Info: Adequate Speech Info: Adequate    SPECIAL CARE FACTORS FREQUENCY  PT (By licensed PT), OT (By licensed OT)     PT Frequency:  (5) OT Frequency:  (5)             Contractures      Additional Factors Info  Code Status, Allergies Code Status Info:  (Full Code) Allergies Info:  (Peanut Oil, Risperidone And Related)           Current Medications (02/15/2017):  This is the current hospital active medication list Current Facility-Administered Medications  Medication Dose Route Frequency Provider Last Rate Last Dose  . acetaminophen (TYLENOL) tablet 650 mg  650 mg Oral Q6H PRN Alexis Hugelmeyer, DO      . acidophilus (RISAQUAD) capsule 2 capsule  2 capsule Oral TID Alexis Hugelmeyer, DO   2 capsule at 02/15/17 0854  . albuterol (PROVENTIL) (2.5 MG/3ML) 0.083% nebulizer solution 2.5 mg  2.5 mg Nebulization Q6H PRN Alexis Hugelmeyer, DO      . albuterol (PROVENTIL) (2.5 MG/3ML) 0.083% nebulizer solution 3 mL  3 mL Inhalation QID PRN Alexis Hugelmeyer, DO      . bisacodyl (DULCOLAX) EC tablet 5 mg  5 mg Oral Daily PRN Alexis Hugelmeyer, DO      . calcium carbonate (TUMS - dosed in mg elemental calcium) chewable tablet 600 mg of elemental calcium  3 tablet Oral BID WC Alexis Hugelmeyer, DO   600 mg of elemental calcium at 02/15/17 0854  . ceFEPIme (MAXIPIME) 2 g in dextrose 5 % 50 mL IVPB  2 g Intravenous Q12H Alexis Hugelmeyer, DO   Stopped at 02/15/17 0537  . cholecalciferol (VITAMIN D) tablet 5,000 Units  5,000 Units Oral Daily Alexis Hugelmeyer, DO  5,000 Units at 02/15/17 0853  . cholestyramine light (PREVALITE) packet 4 g  4 g Oral BID Alexis Hugelmeyer, DO   4 g at 02/15/17 8416  . citalopram (CELEXA) tablet 40 mg  40 mg Oral Daily Alexis Hugelmeyer, DO   40 mg at 02/15/17 0853  . diphenhydrAMINE (BENADRYL) capsule 50 mg  50 mg Oral QHS Alexis Hugelmeyer, DO   50 mg at 02/14/17 2125  . enoxaparin (LOVENOX) injection 40 mg  40 mg Subcutaneous BID Alexis Hugelmeyer, DO   40 mg at 02/15/17 0851  . feeding supplement (PRO-STAT SUGAR FREE 64) liquid 30 mL  30 mL Oral TID WC Alexis Hugelmeyer, DO   30 mL at 02/15/17 0854  . gemfibrozil (LOPID) tablet  600 mg  600 mg Oral BID Alexis Hugelmeyer, DO   600 mg at 02/15/17 6063  . hyoscyamine (ANASPAZ) disintergrating tablet 0.125 mg  0.125 mg Sublingual Q6H PRN Alexis Hugelmeyer, DO      . ipratropium (ATROVENT) nebulizer solution 0.5 mg  0.5 mg Nebulization Q6H PRN Alexis Hugelmeyer, DO      . [START ON 02/16/2017] levothyroxine (SYNTHROID, LEVOTHROID) tablet 75 mcg  75 mcg Oral QAC breakfast Vaughan Basta, MD      . LORazepam (ATIVAN) tablet 0.5 mg  0.5 mg Oral Q4H PRN Alexis Hugelmeyer, DO      . LORazepam (ATIVAN) tablet 1 mg  1 mg Oral BH-q7a Alexis Hugelmeyer, DO   1 mg at 02/15/17 0853  . magnesium oxide (MAG-OX) tablet 400 mg  400 mg Oral Daily Alexis Hugelmeyer, DO   400 mg at 02/15/17 0853  . mesalamine (PENTASA) CR capsule 1,500 mg  1,500 mg Oral BH-q7a Alexis Hugelmeyer, DO   1,500 mg at 02/15/17 0851  . midodrine (PROAMATINE) tablet 10 mg  10 mg Oral TID WC Alexis Hugelmeyer, DO   10 mg at 02/15/17 0853  . multivitamin with minerals tablet 1 tablet  1 tablet Oral Daily Alexis Hugelmeyer, DO   1 tablet at 02/15/17 0852  . omega-3 acid ethyl esters (LOVAZA) capsule 2 g  2 g Oral BH-q7a Alexis Hugelmeyer, DO   2 g at 02/15/17 0160  . ondansetron (ZOFRAN) tablet 4 mg  4 mg Oral Q6H PRN Alexis Hugelmeyer, DO       Or  . ondansetron (ZOFRAN) injection 4 mg  4 mg Intravenous Q6H PRN Alexis Hugelmeyer, DO      . oxyCODONE (Oxy IR/ROXICODONE) immediate release tablet 5 mg  5 mg Oral Q4H PRN Alexis Hugelmeyer, DO      . pantoprazole (PROTONIX) EC tablet 40 mg  40 mg Oral Daily Alexis Hugelmeyer, DO   40 mg at 02/15/17 0853  . potassium chloride SA (K-DUR,KLOR-CON) CR tablet 20 mEq  20 mEq Oral BID Alexis Hugelmeyer, DO   20 mEq at 02/15/17 0853  . simvastatin (ZOCOR) tablet 20 mg  20 mg Oral QHS Alexis Hugelmeyer, DO   20 mg at 02/14/17 2126  . vitamin B-12 (CYANOCOBALAMIN) tablet 1,000 mcg  1,000 mcg Oral Daily Alexis Hugelmeyer, DO   1,000 mcg at 02/15/17 1093  . vitamin C (ASCORBIC  ACID) tablet 500 mg  500 mg Oral BID Alexis Hugelmeyer, DO   500 mg at 02/15/17 2355     Discharge Medications: Please see discharge summary for a list of discharge medications.  Relevant Imaging Results:  Relevant Lab Results:   Additional Information  (SSN: [id number])  Sample, Kiara Beets, LCSW

## 2017-02-15 NOTE — Progress Notes (Signed)
[facility]. IV removed with cath intact. VS baseline. Report called. Packet and paperwork sent with patient.

## 2017-02-15 NOTE — Discharge Summary (Signed)
[name] NAME: [name]    MR#:  [id number]  DATE OF BIRTH:  May 03, 1971  DATE OF ADMISSION:  02/13/2017 ADMITTING PHYSICIAN: Harvie Bridge, DO  DATE OF DISCHARGE: 02/15/2017  PRIMARY CARE PHYSICIAN: Lorelee Market, MD    ADMISSION DIAGNOSIS:  Hyponatremia [E87.1] Weakness [R53.1] Sepsis ([place]) [A41.9]  DISCHARGE DIAGNOSIS:  Principal Problem:   Undifferentiated schizophrenia ([place]) Active Problems:   Sepsis secondary to UTI ([place])   SECONDARY DIAGNOSIS:   Past Medical History:  Diagnosis Date  . Abnormal mammogram, unspecified 2013   Prev. cytology,hypercellular smears without evidence of malignant cells. The cytopathologist questioned if samples truly representative. Care taken during sampling and is felt to be representative.  . Breast screening, unspecified 2013  . Broken leg    age 14  . Cellulitis   . Crohn's disease ([place]) 2013  . Depression   . Diabetes mellitus without complication (HCC)    non insulin dependent  . Early menopause   . Edema   . GERD (gastroesophageal reflux disease)   . Hiatal hernia 2013  . Hyperlipidemia   . Hypertension   . Hypothyroidism   . 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 Obesity, unspecified 2013  . Other sign and symptom in breast 2013   Right bst US,lower outer quadrant,A single 0.3x0.4x0.6cm hypoechoic mass with slightly lobulated borders with adjacent 0.3x0.4x0.5cm mass was noted. The 1st was 5cm from nipple, 2nd at 8 cm from the nipple. The previous lesion aspirate was at 3 0'clock position. These lesions are thought to account for the mammographic abnormality. Minimal interval change on [place].   [facility])   . Rib fracture    age 89  . Schizophrenia ([place])   . TBI (traumatic brain injury) ([place])   . Thyroid disease    hypothyroid  . Vitamin D deficiency     HOSPITAL COURSE:   #. Sepsis 2/2 UTI - IV  antibiotics: Maxipime, Vanco- stopped vanc. - IV fluid hydration - Follow up blood,urine & sputum cultures - Repeat CBC in am.  - Continue midodrine for hypotension - pt improved, not much nausea, change to oral Abx.  #. Weakness secondary to hyponatremia - IVFs and repeat BMP - PT eval- SNF.  #. Hypokalemia - Replaced PO  #. LUE edema - doppler negative for DVT. Likely stasis secondary to immobility.   #. Weeping edema B/L lower extremities, likely stasis secondary to immobility - B/L LE dopplers - negative for DVT.  #. History of depression, schizophrenia - Continue Celexa, Ativan - Hold Risperdone secondary to reported allergy. - Psych consultation appreciated.  #. History of HLD - Continue Lopid, Zocor, Lovaza  #. History of GERD - Continue Protonix  #. History of Crohn's - Continue mesalamine, hyoscyamine  #. History of hypothyroidism - Continue Synthroid - TSH was high- so increased dose of medicine- follow TSH in 3-4 weeks.  #. H/o Diabetes - Accuchecks achs with RISS coverage - Heart healthy, carb controlled diet   DISCHARGE CONDITIONS:   Stable.  CONSULTS OBTAINED:  Treatment Team:  Gonzella Lex, MD  DRUG ALLERGIES:   Allergies  Allergen Reactions  . Peanut Oil Other (See Comments)    Face turns red  . Risperidone And Related Cough    DISCHARGE MEDICATIONS:   Current Discharge Medication List    START taking these medications   Details  acidophilus (RISAQUAD) CAPS capsule Take 2 capsules  by mouth 3 (three) times daily. Qty: 20 capsule, Refills: 0    cefUROXime (CEFTIN) 250 MG tablet Take 1 tablet (250 mg total) by mouth 2 (two) times daily with a meal. Qty: 8 tablet, Refills: 0    levothyroxine (SYNTHROID, LEVOTHROID) 75 MCG tablet Take 1 tablet (75 mcg total) by mouth daily before breakfast. Qty: 30 tablet, Refills: 0      CONTINUE these medications which have CHANGED   Details  docusate sodium (COLACE) 100 MG capsule  Take 1 capsule (100 mg total) by mouth 2 (two) times daily as needed for mild constipation. Qty: 10 capsule, Refills: 0      CONTINUE these medications which have NOT CHANGED   Details  acetaminophen (TYLENOL) 325 MG tablet Take 650 mg by mouth every 6 (six) hours as needed for mild pain. Every 4 to 6 hours PRN    Amino Acids-Protein Hydrolys (FEEDING SUPPLEMENT, PRO-STAT SUGAR FREE 64,) LIQD Take 30 mLs by mouth 3 (three) times daily with meals.    ascorbic acid (VITAMIN C) 500 MG tablet Take 500 mg by mouth 2 (two) times daily.    bisacodyl (DULCOLAX) 5 MG EC tablet Take 1 tablet (5 mg total) by mouth daily as needed for moderate constipation. Qty: 30 tablet, Refills: 0    calcium carbonate (OSCAL) 1500 (600 Ca) MG TABS tablet Take 600 mg of elemental calcium by mouth 2 (two) times daily with a meal.    Cholecalciferol 5000 units TABS Take 5,000 Units by mouth daily.    cholestyramine light (PREVALITE) 4 g packet Take 1 packet (4 g total) by mouth 2 (two) times daily. Qty: 60 packet, Refills: 0    citalopram (CELEXA) 40 MG tablet Take 40 mg by mouth daily.    diphenhydrAMINE (BENADRYL) 50 MG capsule Take 50 mg by mouth at bedtime.    gemfibrozil (LOPID) 600 MG tablet Take 600 mg by mouth 2 (two) times daily.    hyoscyamine (ANASPAZ) 0.125 MG TBDP disintergrating tablet Place 0.125 mg under the tongue every 6 (six) hours as needed.    !! LORazepam (ATIVAN) 0.5 MG tablet Take 1 tablet (0.5 mg total) by mouth every 4 (four) hours as needed for anxiety. Qty: 10 tablet, Refills: 0    !! LORazepam (ATIVAN) 1 MG tablet Take 1 tablet (1 mg total) by mouth every morning. Qty: 20 tablet, Refills: 0    magnesium oxide (MAG-OX) 400 MG tablet Take 400 mg by mouth daily.    mesalamine (APRISO) 0.375 g 24 hr capsule Take 1,500 mg by mouth every morning.    midodrine (PROAMATINE) 10 MG tablet Take 1 tablet (10 mg total) by mouth 3 (three) times daily with meals.    Multiple Vitamin  (MULTIVITAMIN) tablet Take 1 tablet by mouth daily.    omega-3 acid ethyl esters (LOVAZA) 1 g capsule Take 2 g by mouth every morning.     pantoprazole (PROTONIX) 40 MG tablet Take 40 mg by mouth daily.     potassium chloride (K-DUR) 10 MEQ tablet Take 20 mEq by mouth 2 (two) times daily.    simvastatin (ZOCOR) 20 MG tablet Take 20 mg by mouth at bedtime.    vitamin B-12 (CYANOCOBALAMIN) 1000 MCG tablet Take 1,000 mcg by mouth daily.    albuterol (PROVENTIL HFA;VENTOLIN HFA) 108 (90 Base) MCG/ACT inhaler Inhale 1 puff into the lungs 4 (four) times daily as needed for wheezing or shortness of breath.    ondansetron (ZOFRAN) 4 MG tablet Take 4 mg by  mouth every 6 (six) hours as needed for nausea or vomiting.    pseudoephedrine-guaifenesin (MUCINEX D) 60-600 MG 12 hr tablet Take 2 tablets by mouth every 12 (twelve) hours as needed for congestion.      !! - Potential duplicate medications found. Please discuss with provider.    STOP taking these medications     Levothyroxine Sodium 50 MCG CAPS      mesalamine (ROWASA) 4 g enema      risperiDONE (RISPERDAL M-TAB) 1 MG disintegrating tablet          DISCHARGE INSTRUCTIONS:    Follow TSH in 3-4 weeks.  If you experience worsening of your admission symptoms, develop shortness of breath, life threatening emergency, suicidal or homicidal thoughts you must seek medical attention immediately by calling 911 or calling your MD immediately  if symptoms less severe.  You Must read complete instructions/literature along with all the possible adverse reactions/side effects for all the Medicines you take and that have been prescribed to you. Take any new Medicines after you have completely understood and accept all the possible adverse reactions/side effects.   Please note  You were cared for by a hospitalist during your hospital stay. If you have any questions about your discharge medications or the care you received while you were in the  hospital after you are discharged, you can call the unit and asked to speak with the hospitalist on call if the hospitalist that took care of you is not available. Once you are discharged, your primary care physician will handle any further medical issues. Please note that NO REFILLS for any discharge medications will be authorized once you are discharged, as it is imperative that you return to your primary care physician (or establish a relationship with a primary care physician if you do not have one) for your aftercare needs so that they can reassess your need for medications and monitor your lab values.    Today   CHIEF COMPLAINT:   Chief Complaint  Patient presents with  . Weakness    HISTORY OF PRESENT ILLNESS:  [name]  is a 46 y.o. female with a known history of Crohn's, DM, depression, GERD, HLD, HTN, hypothyroidism, schizophrenia, TBI presents to the emergency department for evaluation of weakness.  Patient reports living at [facility] following a hospitalization.  She has been essentially bed bound for the past month getting up infrequently and using a walker.  She states her immobility is due to weakness.  She has not felt well for over a month.  She also complains of dysuria, frequency, vaginal burning, lower extremity swelling and blisters.  She also asked to have her stomach pumped because a nurse at [facility] is trying to poison her with risperidone which she is known to be allergic to.  .  Patient denies fevers/chills, weakness, dizziness, chest pain, shortness of breath, N/V/C/D, abdominal pain, dysuria/frequency, changes in mental status.    Of note patient was admittted here in 11/17 with severe sepsis.  She has also recently been started on midodrine for hypotension.     VITAL SIGNS:  Blood pressure (!) 85/42, pulse 91, temperature 97.9 F (36.6 C), temperature source Oral, resp. rate 18, height 5\' 6"  (1.676 m), weight 123.7 kg (272 lb 12.8 oz), SpO2 100 %.  I/O:    Intake/Output Summary (Last 24 hours) at 02/15/17 1110 Last data filed at 02/15/17 1002  Gross per 24 hour  Intake  2810 ml  Output                0 ml  Net             2810 ml    PHYSICAL EXAMINATION:   GENERAL:  46 y.o.-year-old patient lying in the bed with no acute distress.  EYES: Pupils equal, round, reactive to light and accommodation. No scleral icterus. Extraocular muscles intact.  HEENT: Head atraumatic, normocephalic. Oropharynx and nasopharynx clear.  NECK:  Supple, no jugular venous distention. No thyroid enlargement, no tenderness.  LUNGS: Normal breath sounds bilaterally, no wheezing, rales,rhonchi or crepitation. No use of accessory muscles of respiration.  CARDIOVASCULAR: S1, S2 normal. No murmurs, rubs, or gallops.  ABDOMEN: Soft, nontender, nondistended. Bowel sounds present. No organomegaly or mass.  EXTREMITIES: No pedal edema, cyanosis, or clubbing.  NEUROLOGIC: Cranial nerves II through XII are intact. Muscle strength 4/5 in all extremities. Sensation intact. Gait not checked.  PSYCHIATRIC: The patient is alert and oriented x 3.  SKIN: No obvious rash, lesion, or ulcer.  DATA REVIEW:   CBC  Recent Labs Lab 02/15/17 0804  WBC 14.4*  HGB 10.8*  HCT 32.6*  PLT 639*    Chemistries   Recent Labs Lab 02/13/17 1913 02/14/17 0250 02/15/17 0804  NA 127* 130* 130*  K 3.2* 3.0* 3.2*  CL 100* 101 103  CO2 19* 22 22  GLUCOSE 80 83 71  BUN 16 17 14   CREATININE 1.16* 1.24* 1.04*  CALCIUM 6.8* 6.7* 7.0*  MG  --  1.8  --   AST 18  --   --   ALT 9*  --   --   ALKPHOS 100  --   --   BILITOT 0.4  --   --     Cardiac Enzymes No results for input(s): TROPONINI in the last 168 hours.  Microbiology Results  Results for orders placed or performed during the hospital encounter of 02/13/17  MRSA PCR Screening     Status: None   Collection Time: 02/14/17  2:25 AM  Result Value Ref Range Status   MRSA by PCR NEGATIVE NEGATIVE Final     Comment:        The GeneXpert MRSA Assay (FDA approved for NASAL specimens only), is one component of a comprehensive MRSA colonization surveillance program. It is not intended to diagnose MRSA infection nor to guide or monitor treatment for MRSA infections.     RADIOLOGY:  Dg Chest 1 View  Result Date: 02/14/2017 CLINICAL DATA:  Acute onset of sepsis.  Initial encounter. EXAM: CHEST 1 VIEW COMPARISON:  Chest radiograph performed 01/23/2017 FINDINGS: The lungs are well-aerated and clear. There is no evidence of focal opacification, pleural effusion or pneumothorax. The cardiomediastinal silhouette is within normal limits. No acute osseous abnormalities are seen. IMPRESSION: No acute cardiopulmonary process seen. Electronically Signed   By: Garald Balding M.D.   On: 02/14/2017 01:20   US Venous Img Lower Bilateral  Result Date: 02/14/2017 CLINICAL DATA:  Edema for 1 month EXAM: BILATERAL LOWER EXTREMITY VENOUS DOPPLER ULTRASOUND TECHNIQUE: Gray-scale sonography with graded compression, as well as color Doppler and duplex ultrasound were performed to evaluate the lower extremity deep venous systems from the level of the common femoral vein and including the common femoral, femoral, profunda femoral, popliteal and calf veins including the posterior tibial, peroneal and gastrocnemius veins when visible. The superficial great saphenous vein was also interrogated. Spectral Doppler was utilized to evaluate flow at rest and with  distal augmentation maneuvers in the common femoral, femoral and popliteal veins. COMPARISON:  01/24/2017 FINDINGS: RIGHT LOWER EXTREMITY Common Femoral Vein: No evidence of thrombus. Normal compressibility, respiratory phasicity and response to augmentation. Saphenofemoral Junction: No evidence of thrombus. Normal compressibility and flow on color Doppler imaging. Profunda Femoral Vein: No evidence of thrombus. Normal compressibility and flow on color Doppler imaging. Femoral  Vein: No evidence of thrombus. Normal compressibility, respiratory phasicity and response to augmentation. Popliteal Vein: No evidence of thrombus. Normal compressibility, respiratory phasicity and response to augmentation. Calf Veins: Not visualized due to patient's large body habitus Superficial Great Saphenous Vein: No evidence of thrombus. Normal compressibility and flow on color Doppler imaging. Venous Reflux:  None. Other Findings:  None. LEFT LOWER EXTREMITY Common Femoral Vein: No evidence of thrombus. Normal compressibility, respiratory phasicity and response to augmentation. Saphenofemoral Junction: No evidence of thrombus. Normal compressibility and flow on color Doppler imaging. Profunda Femoral Vein: No evidence of thrombus. Normal compressibility and flow on color Doppler imaging. Femoral Vein: No evidence of thrombus. Normal compressibility, respiratory phasicity and response to augmentation. Popliteal Vein: No evidence of thrombus. Normal compressibility, respiratory phasicity and response to augmentation. Calf Veins: Not visualized due to patient's large body habitus Superficial Great Saphenous Vein: No evidence of thrombus. Normal compressibility and flow on color Doppler imaging. Venous Reflux:  None. Other Findings:  None. IMPRESSION: No evidence of deep venous thrombosis bilateral lower extremity. Electronically Signed   By: Lahoma Crocker M.D.   On: 02/14/2017 10:21   US Venous Img Upper Uni Left  Result Date: 02/13/2017 CLINICAL DATA:  Left arm edema EXAM: Left UPPER EXTREMITY VENOUS DOPPLER ULTRASOUND TECHNIQUE: Gray-scale sonography with graded compression, as well as color Doppler and duplex ultrasound were performed to evaluate the upper extremity deep venous system from the level of the subclavian vein and including the jugular, axillary, basilic, radial, ulnar and upper cephalic vein. Spectral Doppler was utilized to evaluate flow at rest and with distal augmentation maneuvers.  COMPARISON:  None. FINDINGS: Contralateral Subclavian Vein: Respiratory phasicity is normal and symmetric with the symptomatic side. No evidence of thrombus. Normal compressibility. Internal Jugular Vein: No evidence of thrombus. Normal compressibility, respiratory phasicity and response to augmentation. Subclavian Vein: No evidence of thrombus. Normal compressibility, respiratory phasicity and response to augmentation. Axillary Vein: No evidence of thrombus. Normal compressibility, respiratory phasicity and response to augmentation. Cephalic Vein: No evidence of thrombus. Normal compressibility, respiratory phasicity and response to augmentation. Basilic Vein: No evidence of thrombus. Normal compressibility, respiratory phasicity and response to augmentation. Brachial Veins: No evidence of thrombus. Normal compressibility, respiratory phasicity and response to augmentation. Radial Veins: No evidence of thrombus.  Normal compressibility Ulnar Veins: No evidence of thrombus.  Normal compressibility Venous Reflux:  None visualized. Other Findings:  None visualized. IMPRESSION: No evidence of deep venous thrombosis. Electronically Signed   By: Donavan Foil M.D.   On: 02/13/2017 21:39    EKG:   Orders placed or performed during the hospital encounter of 02/13/17  . ED EKG  . ED EKG  . EKG 12-Lead  . EKG 12-Lead  . ED EKG  . ED EKG  . EKG 12-Lead    Management plans discussed with the patient, family and they are in agreement.  CODE STATUS:     Code Status Orders        Start     Ordered   02/14/17 0122  Full code  Continuous     02/14/17 0121    Code Status  History    Date Active Date Inactive Code Status Order ID Comments User Context   01/23/2017  5:26 PM 01/28/2017  5:54 PM Full Code [id number]  Demetrios Loll, MD Inpatient   09/27/2016  8:45 AM 10/18/2016  8:43 PM Full Code [id number]  Flora Lipps, MD ED      TOTAL TIME TAKING CARE OF THIS PATIENT: 35 minutes.    Vaughan Basta  M.D on 02/15/2017 at 11:10 AM  Between 7am to 6pm - Pager - [phone]  After 6pm go to [email or web address] - password [email or web address]  Office  [phone]  CC: Primary care physician; Lorelee Market, MD   Note: This dictation was prepared with Dragon dictation along with smaller phrase technology. Any transcriptional errors that result from this process are unintentional.

## 2017-02-15 NOTE — Discharge Instructions (Signed)
Levothyroxine dose is increased, please check TSH in 3-4 weeks.

## 2017-02-15 NOTE — Progress Notes (Addendum)
Patient is medically stable for D/C back to [facility] today. Per Tammy admissions coordinator at [facility] patient can return today to room 713. RN will call report and arrange EMS for transport. Clinical Education officer, museum (CSW) sent D/C orders to [facility] via [facility]. Patient is aware of above. CSW left patient's aunt Barbaraann Share a Advertising account executive. Please reconsult if future social work needs arise. CSW signing off.   Patient's aunt Barbaraann Share called CSW back and was made aware of above.   [employer], LCSW [phone]

## 2017-02-15 NOTE — Progress Notes (Signed)
[facility] [place] NAME: [name]    MR#:  [id number]  DATE OF BIRTH:  [birth year]  SUBJECTIVE:  CHIEF COMPLAINT:   Chief Complaint  Patient presents with  . Weakness     Came with weakness, found to have UTI , sepsis. Feels little better.  REVIEW OF SYSTEMS:  CONSTITUTIONAL: No fever, positive for fatigue or weakness.  EYES: No blurred or double vision.  EARS, NOSE, AND THROAT: No tinnitus or ear pain.  RESPIRATORY: No cough, shortness of breath, wheezing or hemoptysis.  CARDIOVASCULAR: No chest pain, orthopnea, edema.  GASTROINTESTINAL: No nausea, vomiting, diarrhea or abdominal pain.  GENITOURINARY: No dysuria, hematuria.  ENDOCRINE: No polyuria, nocturia,  HEMATOLOGY: No anemia, easy bruising or bleeding SKIN: No rash or lesion. MUSCULOSKELETAL: No joint pain or arthritis.   NEUROLOGIC: No tingling, numbness, weakness.  PSYCHIATRY: No anxiety or depression.   ROS  DRUG ALLERGIES:   Allergies  Allergen Reactions  . Peanut Oil Other (See Comments)    Face turns red  . Risperidone And Related Cough    VITALS:  Blood pressure (!) 82/40, pulse 85, temperature 97.9 F (36.6 C), temperature source Oral, resp. rate 18, height 5\' 6"  (1.676 m), weight 123.7 kg (272 lb 12.8 oz), SpO2 100 %.  PHYSICAL EXAMINATION:  GENERAL:  46 y.o.-year-old patient lying in the bed with no acute distress.  EYES: Pupils equal, round, reactive to light and accommodation. No scleral icterus. Extraocular muscles intact.  HEENT: Head atraumatic, normocephalic. Oropharynx and nasopharynx clear.  NECK:  Supple, no jugular venous distention. No thyroid enlargement, no tenderness.  LUNGS: Normal breath sounds bilaterally, no wheezing, rales,rhonchi or crepitation. No use of accessory muscles of respiration.  CARDIOVASCULAR: S1, S2 normal. No murmurs, rubs, or gallops.  ABDOMEN: Soft, nontender, nondistended. Bowel sounds present. No organomegaly or  mass.  EXTREMITIES: No pedal edema, cyanosis, or clubbing.  NEUROLOGIC: Cranial nerves II through XII are intact. Muscle strength 4/5 in all extremities. Sensation intact. Gait not checked.  PSYCHIATRIC: The patient is alert and oriented x 3.  SKIN: No obvious rash, lesion, or ulcer.   Physical Exam LABORATORY PANEL:   CBC  Recent Labs Lab 02/14/17 0250  WBC 13.8*  HGB 9.4*  HCT 28.2*  PLT 652*   ------------------------------------------------------------------------------------------------------------------  Chemistries   Recent Labs Lab 02/13/17 1913 02/14/17 0250  NA 127* 130*  K 3.2* 3.0*  CL 100* 101  CO2 19* 22  GLUCOSE 80 83  BUN 16 17  CREATININE 1.16* 1.24*  CALCIUM 6.8* 6.7*  MG  --  1.8  AST 18  --   ALT 9*  --   ALKPHOS 100  --   BILITOT 0.4  --    ------------------------------------------------------------------------------------------------------------------  Cardiac Enzymes No results for input(s): TROPONINI in the last 168 hours. ------------------------------------------------------------------------------------------------------------------  RADIOLOGY:  Dg Chest 1 View  Result Date: 02/14/2017 CLINICAL DATA:  Acute onset of sepsis.  Initial encounter. EXAM: CHEST 1 VIEW COMPARISON:  Chest radiograph performed 01/23/2017 FINDINGS: The lungs are well-aerated and clear. There is no evidence of focal opacification, pleural effusion or pneumothorax. The cardiomediastinal silhouette is within normal limits. No acute osseous abnormalities are seen. IMPRESSION: No acute cardiopulmonary process seen. Electronically Signed   By: Garald Balding M.D.   On: 02/14/2017 01:20   US Venous Img Lower Bilateral  Result Date: 02/14/2017 CLINICAL DATA:  Edema for 1 month EXAM: BILATERAL LOWER EXTREMITY VENOUS DOPPLER ULTRASOUND TECHNIQUE: Gray-scale sonography with graded compression, as  well as color Doppler and duplex ultrasound were performed to evaluate the  lower extremity deep venous systems from the level of the common femoral vein and including the common femoral, femoral, profunda femoral, popliteal and calf veins including the posterior tibial, peroneal and gastrocnemius veins when visible. The superficial great saphenous vein was also interrogated. Spectral Doppler was utilized to evaluate flow at rest and with distal augmentation maneuvers in the common femoral, femoral and popliteal veins. COMPARISON:  01/24/2017 FINDINGS: RIGHT LOWER EXTREMITY Common Femoral Vein: No evidence of thrombus. Normal compressibility, respiratory phasicity and response to augmentation. Saphenofemoral Junction: No evidence of thrombus. Normal compressibility and flow on color Doppler imaging. Profunda Femoral Vein: No evidence of thrombus. Normal compressibility and flow on color Doppler imaging. Femoral Vein: No evidence of thrombus. Normal compressibility, respiratory phasicity and response to augmentation. Popliteal Vein: No evidence of thrombus. Normal compressibility, respiratory phasicity and response to augmentation. Calf Veins: Not visualized due to patient's large body habitus Superficial Great Saphenous Vein: No evidence of thrombus. Normal compressibility and flow on color Doppler imaging. Venous Reflux:  None. Other Findings:  None. LEFT LOWER EXTREMITY Common Femoral Vein: No evidence of thrombus. Normal compressibility, respiratory phasicity and response to augmentation. Saphenofemoral Junction: No evidence of thrombus. Normal compressibility and flow on color Doppler imaging. Profunda Femoral Vein: No evidence of thrombus. Normal compressibility and flow on color Doppler imaging. Femoral Vein: No evidence of thrombus. Normal compressibility, respiratory phasicity and response to augmentation. Popliteal Vein: No evidence of thrombus. Normal compressibility, respiratory phasicity and response to augmentation. Calf Veins: Not visualized due to patient's large body habitus  Superficial Great Saphenous Vein: No evidence of thrombus. Normal compressibility and flow on color Doppler imaging. Venous Reflux:  None. Other Findings:  None. IMPRESSION: No evidence of deep venous thrombosis bilateral lower extremity. Electronically Signed   By: Lahoma Crocker M.D.   On: 02/14/2017 10:21   US Venous Img Upper Uni Left  Result Date: 02/13/2017 CLINICAL DATA:  Left arm edema EXAM: Left UPPER EXTREMITY VENOUS DOPPLER ULTRASOUND TECHNIQUE: Gray-scale sonography with graded compression, as well as color Doppler and duplex ultrasound were performed to evaluate the upper extremity deep venous system from the level of the subclavian vein and including the jugular, axillary, basilic, radial, ulnar and upper cephalic vein. Spectral Doppler was utilized to evaluate flow at rest and with distal augmentation maneuvers. COMPARISON:  None. FINDINGS: Contralateral Subclavian Vein: Respiratory phasicity is normal and symmetric with the symptomatic side. No evidence of thrombus. Normal compressibility. Internal Jugular Vein: No evidence of thrombus. Normal compressibility, respiratory phasicity and response to augmentation. Subclavian Vein: No evidence of thrombus. Normal compressibility, respiratory phasicity and response to augmentation. Axillary Vein: No evidence of thrombus. Normal compressibility, respiratory phasicity and response to augmentation. Cephalic Vein: No evidence of thrombus. Normal compressibility, respiratory phasicity and response to augmentation. Basilic Vein: No evidence of thrombus. Normal compressibility, respiratory phasicity and response to augmentation. Brachial Veins: No evidence of thrombus. Normal compressibility, respiratory phasicity and response to augmentation. Radial Veins: No evidence of thrombus.  Normal compressibility Ulnar Veins: No evidence of thrombus.  Normal compressibility Venous Reflux:  None visualized. Other Findings:  None visualized. IMPRESSION: No evidence of  deep venous thrombosis. Electronically Signed   By: Donavan Foil M.D.   On: 02/13/2017 21:39    ASSESSMENT AND PLAN:   Principal Problem:   Undifferentiated schizophrenia ([place]) Active Problems:   Sepsis secondary to UTI ([place])  #. Sepsis 2/2 UTI - IV antibiotics: Maxipime, Vanco- stopped  vanc. - IV fluid hydration - Follow up blood,urine & sputum cultures - Repeat CBC in am.  - Continue midodrine for hypotension  #. Weakness secondary to hyponatremia - IVFs and repeat BMP - PT eval  #. Hypokalemia - Replace PO  #. LUE edema - doppler negative for DVT. Likely stasis secondary to immobility.   #. Weeping edema B/L lower extremities, likely stasis secondary to immobility - B/L LE dopplers - negative for DVT. - Wound culture  #. History of depression, schizophrenia - Continue Celexa, Ativan - Hold Risperdone secondary to reported allergy. - Psych consultation  #. History of HLD - Continue Lopid, Zocor, Lovaza  #. History of GERD - Continue Protonix  #. History of Crohn's - Continue mesalamine, hyoscyamine  #. History of hypothyroidism - Continue Synthroid  #. H/o Diabetes - Accuchecks achs with RISS coverage - Heart healthy, carb controlled diet    All the records are reviewed and case discussed with Care Management/Social Workerr. Management plans discussed with the patient, family and they are in agreement.  CODE STATUS: Full  TOTAL TIME TAKING CARE OF THIS PATIENT: 35 minutes.     POSSIBLE D/C IN 1-2 DAYS, DEPENDING ON CLINICAL CONDITION.   Vaughan Basta M.D on 02/15/2017   Between 7am to 6pm - Pager - [phone]  After 6pm go to [email or web address] - password [email or web address] [facility]  Office  [phone]  CC: Primary care physician; Lorelee Market, MD  Note: This dictation was prepared with Dragon dictation along with smaller phrase technology. Any transcriptional errors that result from this process are  unintentional.

## 2017-02-16 LAB — URINE CULTURE

## 2017-03-02 ENCOUNTER — Encounter: Payer: Self-pay | Admitting: Emergency Medicine

## 2017-03-02 ENCOUNTER — Emergency Department: Payer: Medicare Other

## 2017-03-02 ENCOUNTER — Inpatient Hospital Stay
Admission: EM | Admit: 2017-03-02 | Discharge: 2017-03-07 | DRG: 642 | Disposition: A | Discharge status: Skilled Nursing Facility | Payer: Medicare Other | Attending: Internal Medicine | Admitting: Internal Medicine

## 2017-03-02 DIAGNOSIS — Z6841 Body Mass Index (BMI) 40.0 and over, adult: Secondary | ICD-10-CM

## 2017-03-02 DIAGNOSIS — R7989 Other specified abnormal findings of blood chemistry: Secondary | ICD-10-CM | POA: Diagnosis not present

## 2017-03-02 DIAGNOSIS — E559 Vitamin D deficiency, unspecified: Secondary | ICD-10-CM | POA: Diagnosis present

## 2017-03-02 DIAGNOSIS — E875 Hyperkalemia: Secondary | ICD-10-CM | POA: Diagnosis present

## 2017-03-02 DIAGNOSIS — F209 Schizophrenia, unspecified: Secondary | ICD-10-CM | POA: Diagnosis present

## 2017-03-02 DIAGNOSIS — I110 Hypertensive heart disease with heart failure: Secondary | ICD-10-CM | POA: Diagnosis present

## 2017-03-02 DIAGNOSIS — K5090 Crohn's disease, unspecified, without complications: Secondary | ICD-10-CM | POA: Diagnosis present

## 2017-03-02 DIAGNOSIS — K7290 Hepatic failure, unspecified without coma: Secondary | ICD-10-CM | POA: Diagnosis present

## 2017-03-02 DIAGNOSIS — E039 Hypothyroidism, unspecified: Secondary | ICD-10-CM | POA: Diagnosis present

## 2017-03-02 DIAGNOSIS — Z87820 Personal history of traumatic brain injury: Secondary | ICD-10-CM | POA: Diagnosis not present

## 2017-03-02 DIAGNOSIS — G9341 Metabolic encephalopathy: Secondary | ICD-10-CM | POA: Diagnosis present

## 2017-03-02 DIAGNOSIS — R601 Generalized edema: Secondary | ICD-10-CM | POA: Diagnosis not present

## 2017-03-02 DIAGNOSIS — Z79899 Other long term (current) drug therapy: Secondary | ICD-10-CM

## 2017-03-02 DIAGNOSIS — I878 Other specified disorders of veins: Secondary | ICD-10-CM | POA: Diagnosis present

## 2017-03-02 DIAGNOSIS — D649 Anemia, unspecified: Secondary | ICD-10-CM | POA: Diagnosis present

## 2017-03-02 DIAGNOSIS — E8809 Other disorders of plasma-protein metabolism, not elsewhere classified: Secondary | ICD-10-CM | POA: Diagnosis not present

## 2017-03-02 DIAGNOSIS — R579 Shock, unspecified: Secondary | ICD-10-CM

## 2017-03-02 DIAGNOSIS — E861 Hypovolemia: Secondary | ICD-10-CM | POA: Diagnosis not present

## 2017-03-02 DIAGNOSIS — R401 Stupor: Secondary | ICD-10-CM

## 2017-03-02 DIAGNOSIS — E871 Hypo-osmolality and hyponatremia: Secondary | ICD-10-CM | POA: Diagnosis present

## 2017-03-02 DIAGNOSIS — I959 Hypotension, unspecified: Secondary | ICD-10-CM | POA: Diagnosis not present

## 2017-03-02 DIAGNOSIS — E785 Hyperlipidemia, unspecified: Secondary | ICD-10-CM | POA: Diagnosis present

## 2017-03-02 DIAGNOSIS — E7220 Disorder of urea cycle metabolism, unspecified: Principal | ICD-10-CM | POA: Diagnosis present

## 2017-03-02 DIAGNOSIS — K7682 Hepatic encephalopathy: Secondary | ICD-10-CM

## 2017-03-02 DIAGNOSIS — E669 Obesity, unspecified: Secondary | ICD-10-CM | POA: Diagnosis present

## 2017-03-02 DIAGNOSIS — I5033 Acute on chronic diastolic (congestive) heart failure: Secondary | ICD-10-CM | POA: Diagnosis present

## 2017-03-02 DIAGNOSIS — E43 Unspecified severe protein-calorie malnutrition: Secondary | ICD-10-CM | POA: Diagnosis present

## 2017-03-02 DIAGNOSIS — K219 Gastro-esophageal reflux disease without esophagitis: Secondary | ICD-10-CM | POA: Diagnosis present

## 2017-03-02 DIAGNOSIS — E876 Hypokalemia: Secondary | ICD-10-CM | POA: Diagnosis not present

## 2017-03-02 DIAGNOSIS — Z452 Encounter for adjustment and management of vascular access device: Secondary | ICD-10-CM

## 2017-03-02 LAB — LACTIC ACID, PLASMA
LACTIC ACID, VENOUS: 1.6 mmol/L (ref 0.5–1.9)
Lactic Acid, Venous: 3.3 mmol/L (ref 0.5–1.9)

## 2017-03-02 LAB — URINE DRUG SCREEN, QUALITATIVE (ARMC ONLY)
Amphetamines, Ur Screen: NOT DETECTED
BARBITURATES, UR SCREEN: NOT DETECTED
BENZODIAZEPINE, UR SCRN: NOT DETECTED
CANNABINOID 50 NG, UR ~~LOC~~: NOT DETECTED
Cocaine Metabolite,Ur ~~LOC~~: NOT DETECTED
MDMA (Ecstasy)Ur Screen: NOT DETECTED
Methadone Scn, Ur: NOT DETECTED
Opiate, Ur Screen: POSITIVE — AB
PHENCYCLIDINE (PCP) UR S: NOT DETECTED
TRICYCLIC, UR SCREEN: NOT DETECTED

## 2017-03-02 LAB — COMPREHENSIVE METABOLIC PANEL
ALT: 50 U/L (ref 14–54)
AST: 67 U/L — AB (ref 15–41)
Alkaline Phosphatase: 136 U/L — ABNORMAL HIGH (ref 38–126)
Anion gap: 9 (ref 5–15)
BUN: 8 mg/dL (ref 6–20)
CHLORIDE: 103 mmol/L (ref 101–111)
CO2: 18 mmol/L — AB (ref 22–32)
Calcium: 7 mg/dL — ABNORMAL LOW (ref 8.9–10.3)
Creatinine, Ser: 1.36 mg/dL — ABNORMAL HIGH (ref 0.44–1.00)
GFR calc Af Amer: 54 mL/min — ABNORMAL LOW (ref >60)
GFR calc non Af Amer: 46 mL/min — ABNORMAL LOW (ref >60)
GLUCOSE: 75 mg/dL (ref 65–99)
POTASSIUM: 5.5 mmol/L — AB (ref 3.5–5.1)
Sodium: 130 mmol/L — ABNORMAL LOW (ref 135–145)
Total Bilirubin: 0.5 mg/dL (ref 0.3–1.2)
Total Protein: 3.9 g/dL — ABNORMAL LOW (ref 6.5–8.1)

## 2017-03-02 LAB — ETHANOL: Alcohol, Ethyl (B): <5 mg/dL (ref <5)

## 2017-03-02 LAB — CBC WITH DIFFERENTIAL/PLATELET
BASOS PCT: 0 %
Basophils Absolute: 0 10*3/uL (ref 0–0.1)
Eosinophils Absolute: 0 10*3/uL (ref 0–0.7)
Eosinophils Relative: 0 %
HEMATOCRIT: 30 % — AB (ref 35.0–47.0)
Hemoglobin: 9.7 g/dL — ABNORMAL LOW (ref 12.0–16.0)
LYMPHS ABS: 5.7 10*3/uL — AB (ref 1.0–3.6)
Lymphocytes Relative: 45 %
MCH: 30.5 pg (ref 26.0–34.0)
MCHC: 32.4 g/dL (ref 32.0–36.0)
MCV: 94.2 fL (ref 80.0–100.0)
MONO ABS: 0.7 10*3/uL (ref 0.2–0.9)
Monocytes Relative: 5 %
NEUTROS ABS: 6.4 10*3/uL (ref 1.4–6.5)
Neutrophils Relative %: 50 %
Platelets: 521 10*3/uL — ABNORMAL HIGH (ref 150–440)
RBC: 3.19 MIL/uL — ABNORMAL LOW (ref 3.80–5.20)
RDW: 16 % — AB (ref 11.5–14.5)
WBC: 12.9 10*3/uL — ABNORMAL HIGH (ref 3.6–11.0)

## 2017-03-02 LAB — URINALYSIS, COMPLETE (UACMP) WITH MICROSCOPIC
BACTERIA UA: NONE SEEN
Bilirubin Urine: NEGATIVE
Glucose, UA: NEGATIVE mg/dL
HGB URINE DIPSTICK: NEGATIVE
Ketones, ur: NEGATIVE mg/dL
LEUKOCYTES UA: NEGATIVE
Nitrite: NEGATIVE
PROTEIN: NEGATIVE mg/dL
RBC / HPF: NONE SEEN RBC/hpf (ref 0–5)
Specific Gravity, Urine: 1.008 (ref 1.005–1.030)
pH: 5 (ref 5.0–8.0)

## 2017-03-02 LAB — T4, FREE: Free T4: 0.9 ng/dL (ref 0.61–1.12)

## 2017-03-02 LAB — TROPONIN I: Troponin I: <0.03 ng/mL (ref <0.03)

## 2017-03-02 LAB — TSH: TSH: 6.182 u[IU]/mL — ABNORMAL HIGH (ref 0.350–4.500)

## 2017-03-02 LAB — LIPASE, BLOOD: Lipase: <10 U/L — ABNORMAL LOW (ref 11–51)

## 2017-03-02 LAB — ACETAMINOPHEN LEVEL

## 2017-03-02 LAB — AMMONIA: Ammonia: 94 umol/L — ABNORMAL HIGH (ref 9–35)

## 2017-03-02 LAB — SALICYLATE LEVEL

## 2017-03-02 MED ORDER — ONDANSETRON HCL 4 MG PO TABS
4.0000 mg | ORAL_TABLET | Freq: Four times a day (QID) | ORAL | Status: DC | PRN
  Administered 2017-03-05: 4 mg via ORAL
  Filled 2017-03-02: qty 1

## 2017-03-02 MED ORDER — ONDANSETRON 4 MG PO TBDP
4.0000 mg | ORAL_TABLET | Freq: Once | ORAL | Status: AC
  Administered 2017-03-02: 4 mg via ORAL

## 2017-03-02 MED ORDER — RISAQUAD PO CAPS
2.0000 | ORAL_CAPSULE | Freq: Three times a day (TID) | ORAL | Status: DC
  Administered 2017-03-02 – 2017-03-07 (×14): 2 via ORAL
  Filled 2017-03-02 (×14): qty 2

## 2017-03-02 MED ORDER — ONDANSETRON HCL 4 MG/2ML IJ SOLN
4.0000 mg | Freq: Four times a day (QID) | INTRAMUSCULAR | Status: DC | PRN
  Administered 2017-03-02 – 2017-03-04 (×5): 4 mg via INTRAVENOUS
  Filled 2017-03-02 (×5): qty 2

## 2017-03-02 MED ORDER — ENOXAPARIN SODIUM 40 MG/0.4ML ~~LOC~~ SOLN
40.0000 mg | Freq: Two times a day (BID) | SUBCUTANEOUS | Status: DC
  Administered 2017-03-02 – 2017-03-07 (×10): 40 mg via SUBCUTANEOUS
  Filled 2017-03-02 (×10): qty 0.4

## 2017-03-02 MED ORDER — ALBUTEROL SULFATE (2.5 MG/3ML) 0.083% IN NEBU: 2.5000 mg | INHALATION_SOLUTION | Freq: Four times a day (QID) | RESPIRATORY_TRACT | Status: DC | PRN

## 2017-03-02 MED ORDER — SODIUM CHLORIDE 0.9 % IV BOLUS (SEPSIS)
1000.0000 mL | Freq: Once | INTRAVENOUS | Status: AC
  Administered 2017-03-02: 1000 mL via INTRAVENOUS

## 2017-03-02 MED ORDER — PANTOPRAZOLE SODIUM 40 MG PO TBEC
40.0000 mg | DELAYED_RELEASE_TABLET | Freq: Every day | ORAL | Status: DC
  Administered 2017-03-03 – 2017-03-07 (×5): 40 mg via ORAL
  Filled 2017-03-02 (×5): qty 1

## 2017-03-02 MED ORDER — LORAZEPAM 1 MG PO TABS
0.5000 mg | ORAL_TABLET | Freq: Every day | ORAL | Status: DC
  Administered 2017-03-04: 0.5 mg via ORAL
  Filled 2017-03-02 (×2): qty 1

## 2017-03-02 MED ORDER — ONDANSETRON 4 MG PO TBDP
ORAL_TABLET | ORAL | Status: AC
  Administered 2017-03-02: 4 mg via ORAL
  Filled 2017-03-02: qty 1

## 2017-03-02 MED ORDER — ACETAMINOPHEN 650 MG RE SUPP: 650.0000 mg | Freq: Four times a day (QID) | RECTAL | Status: DC | PRN

## 2017-03-02 MED ORDER — DEXTROSE 5 % IV SOLN
2.0000 g | Freq: Once | INTRAVENOUS | Status: AC
  Administered 2017-03-02: 2 g via INTRAVENOUS
  Filled 2017-03-02: qty 2

## 2017-03-02 MED ORDER — MESALAMINE 1.2 G PO TBEC
1.2000 g | DELAYED_RELEASE_TABLET | Freq: Every day | ORAL | Status: DC
Start: 2017-03-03 — End: 2017-03-07
  Administered 2017-03-03 – 2017-03-07 (×5): 1.2 g via ORAL
  Filled 2017-03-02 (×6): qty 1

## 2017-03-02 MED ORDER — ACETAMINOPHEN 325 MG PO TABS: 650.0000 mg | ORAL_TABLET | Freq: Four times a day (QID) | ORAL | Status: DC | PRN

## 2017-03-02 MED ORDER — SODIUM CHLORIDE 0.9 % IV BOLUS (SEPSIS)
1000.0000 mL | Freq: Once | INTRAVENOUS | Status: AC
  Administered 2017-03-02: 22:00:00 1000 mL via INTRAVENOUS

## 2017-03-02 MED ORDER — CITALOPRAM HYDROBROMIDE 20 MG PO TABS
40.0000 mg | ORAL_TABLET | Freq: Every day | ORAL | Status: DC
  Administered 2017-03-03 – 2017-03-07 (×5): 40 mg via ORAL
  Filled 2017-03-02 (×5): qty 2

## 2017-03-02 MED ORDER — MIDODRINE HCL 5 MG PO TABS
10.0000 mg | ORAL_TABLET | Freq: Four times a day (QID) | ORAL | Status: DC
  Administered 2017-03-02 – 2017-03-07 (×18): 10 mg via ORAL
  Filled 2017-03-02 (×18): qty 2

## 2017-03-02 MED ORDER — VANCOMYCIN HCL 10 G IV SOLR
2000.0000 mg | Freq: Once | INTRAVENOUS | Status: DC
  Filled 2017-03-02: qty 2000

## 2017-03-02 MED ORDER — LACTULOSE 10 GM/15ML PO SOLN
30.0000 g | Freq: Four times a day (QID) | ORAL | Status: DC
  Administered 2017-03-02 – 2017-03-05 (×13): 30 g via ORAL
  Filled 2017-03-02 (×16): qty 60

## 2017-03-02 MED ORDER — VANCOMYCIN HCL 10 G IV SOLR
2000.0000 mg | Freq: Once | INTRAVENOUS | Status: AC
  Administered 2017-03-02: 2000 mg via INTRAVENOUS
  Filled 2017-03-02: qty 2000

## 2017-03-02 MED ORDER — DEXTROSE 5 % IV SOLN
1.0000 g | INTRAVENOUS | Status: DC
  Administered 2017-03-03 – 2017-03-06 (×4): 1 g via INTRAVENOUS
  Filled 2017-03-02 (×7): qty 10

## 2017-03-02 MED ORDER — HYOSCYAMINE SULFATE 0.125 MG SL SUBL
0.1250 mg | SUBLINGUAL_TABLET | Freq: Four times a day (QID) | SUBLINGUAL | Status: DC | PRN
  Filled 2017-03-02: qty 1

## 2017-03-02 MED ORDER — LACTULOSE 10 GM/15ML PO SOLN
30.0000 g | Freq: Once | ORAL | Status: AC
  Administered 2017-03-02: 30 g via ORAL
  Filled 2017-03-02: qty 60

## 2017-03-02 MED ORDER — HYOSCYAMINE SULFATE 0.125 MG PO TABS
0.1250 mg | ORAL_TABLET | Freq: Four times a day (QID) | ORAL | Status: DC | PRN
  Filled 2017-03-02: qty 1

## 2017-03-02 MED ORDER — CEFTRIAXONE SODIUM IN DEXTROSE 20 MG/ML IV SOLN: 1.0000 g | INTRAVENOUS | Status: DC

## 2017-03-02 MED ORDER — MAGNESIUM OXIDE 400 (241.3 MG) MG PO TABS
400.0000 mg | ORAL_TABLET | Freq: Two times a day (BID) | ORAL | Status: DC
  Administered 2017-03-02 – 2017-03-07 (×10): 400 mg via ORAL
  Filled 2017-03-02 (×10): qty 1

## 2017-03-02 NOTE — Progress Notes (Signed)
Pharmacy Antibiotic Note  [name] is a 46 y.o. female admitted on 03/02/2017 with UTI.  Pharmacy has been consulted for ceftriaxone dosing.  Plan: Ceftriaxone 1 g IV q24h  Height: 5\' 6"  (167.6 cm) Weight: 272 lb (123.4 kg) (from weight 2 weeks ago) IBW/kg (Calculated) : 59.3  Temp (24hrs), Avg:98.1 F (36.7 C), Min:98.1 F (36.7 C), Max:98.1 F (36.7 C)   Recent Labs Lab 03/02/17 1600  WBC 12.9*  CREATININE 1.36*  LATICACIDVEN 3.3*    Estimated Creatinine Clearance: 70 mL/min (A) (by C-G formula based on SCr of 1.36 mg/dL (H)).    Allergies  Allergen Reactions  . Peanut Oil Other (See Comments)    Face turns red  . Risperidone And Related Cough    Antimicrobials this admission: ceftriaxone 5/3 >>  Vancomycin 5/3 dose in ED  Dose adjustments this admission:  Microbiology results: 5/3 UCx: Sent   Thank you for allowing pharmacy to be a part of this patient's care.  Lenis Noon, PharmD Clinical Pharmacist 03/02/2017 7:12 PM

## 2017-03-02 NOTE — Progress Notes (Signed)
Anticoagulation monitoring(Lovenox):  46 yo female ordered Lovenox 40 mg Q24h  Filed Weights   03/02/17 1440  Weight: 272 lb (123.4 kg)   BMI 44   Lab Results  Component Value Date   CREATININE 1.36 (H) 03/02/2017   CREATININE 1.04 (H) 02/15/2017   CREATININE 1.24 (H) 02/14/2017   Estimated Creatinine Clearance: 70 mL/min (A) (by C-G formula based on SCr of 1.36 mg/dL (H)). Hemoglobin & Hematocrit     Component Value Date/Time   HGB 9.7 (L) 03/02/2017 1600   HGB 13.7 01/23/2013 1126   HCT 30.0 (L) 03/02/2017 1600   HCT 41.1 01/23/2013 1126     Per Protocol for Patient with estCrcl > 30 ml/min and BMI > 40, will transition to Lovenox 40 mg Q12h.

## 2017-03-02 NOTE — ED Notes (Signed)
Pt cleaned up of large incontinent stool and urine.

## 2017-03-02 NOTE — ED Notes (Signed)
Pt transport to 103 with Kathrin Ruddy, EDT

## 2017-03-02 NOTE — H&P (Signed)
[facility] [place] NAME: [name]    MR#:  [id number]  DATE OF BIRTH:  [birth year]  DATE OF ADMISSION:  03/02/2017  PRIMARY CARE PHYSICIAN: Dr. Juluis Pitch  REQUESTING/REFERRING PHYSICIAN: Dr Dorthula Rue  CHIEF COMPLAINT:   Chief Complaint  Patient presents with  . Altered Mental Status    HISTORY OF PRESENT ILLNESS:  [name]  is a 46 y.o. female [facility] for [facility] mental status. Patient unable to give any history at this time. History obtained from old chart. I was able to contact [name], the patient's power of attorney. Confirm full CODE STATUS. The patient is alert. Not really able to answer any questions at this time. The patient was retching when I was in the room. Looks like the patient was started on Levaquin for a positive urinalysis over the facility. Urine toxicology positive for opiates but I do not see any opiates on the medication reconciliation.  PAST MEDICAL HISTORY:   Past Medical History:  Diagnosis Date  . Abnormal mammogram, unspecified 2013   Prev. cytology,hypercellular smears without evidence of malignant cells. The cytopathologist questioned if samples truly representative. Care taken during sampling and is felt to be representative.  . Breast screening, unspecified 2013  . Broken leg    age 20  . Cellulitis   . Crohn's disease ([place]) 2013  . Depression   . Diabetes mellitus without complication (HCC)    non insulin dependent  . Early menopause   . Edema   . GERD (gastroesophageal reflux disease)   . Hiatal hernia 2013  . Hyperlipidemia   . Hypertension   . Hypothyroidism   . 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 Obesity, unspecified 2013  . Other sign and symptom in breast 2013   Right bst US,lower outer quadrant,A single 0.3x0.4x0.6cm hypoechoic mass with slightly lobulated borders with adjacent 0.3x0.4x0.5cm mass was  noted. The 1st was 5cm from nipple, 2nd at 8 cm from the nipple. The previous lesion aspirate was at 3 0'clock position. These lesions are thought to account for the mammographic abnormality. Minimal interval change on [place].   [facility] enteritis [facility])   . Rib fracture    age 51  . Schizophrenia ([place])   . TBI (traumatic brain injury) ([place])   . Thyroid disease    hypothyroid  . Vitamin D deficiency     PAST SURGICAL HISTORY:   Past Surgical History:  Procedure Laterality Date  . COLONOSCOPY WITH PROPOFOL N/A 02/16/2016   Procedure: COLONOSCOPY WITH PROPOFOL;  Surgeon: Lollie Sails, MD;  Location[facility] ENDOSCOPY;  Service: Endoscopy;  Laterality: N/A;  . COLONOSCOPY WITH PROPOFOL N/A 02/22/2016   Procedure: COLONOSCOPY WITH PROPOFOL;  Surgeon: Lollie Sails, MD;  Location[facility] ENDOSCOPY;  Service: Endoscopy;  Laterality: N/A;  . COLONOSCOPY WITH PROPOFOL N/A 10/14/2016   Procedure: COLONOSCOPY WITH PROPOFOL;  Surgeon: Jonathon Bellows, MD;  Location: [facility] ENDOSCOPY;  Service: Endoscopy;  Laterality: N/A;  . EYE SURGERY Left 2013   cataract surgery  . PERIPHERAL VASCULAR CATHETERIZATION N/A 10/13/2016   Procedure: Dialysis/Perma Catheter Insertion;  Surgeon: Algernon Huxley, MD;  Location: [facility] CV LAB;  Service: Cardiovascular;  Laterality: N/A;  . PERIPHERAL VASCULAR CATHETERIZATION N/A 10/18/2016   Procedure: Dialysis/Perma Catheter Removal;  Surgeon: Katha Cabal, MD;  Location: [place] CV LAB;  Service: Cardiovascular;  Laterality: N/A;  . TUMOR EXCISION Right  age of 18, tumor removed from [place]:   Social History  Substance Use Topics  . Smoking status: Never Smoker  . Smokeless tobacco: Never Used  . Alcohol use No    FAMILY HISTORY:   Family History  Problem Relation Age of Onset  . Cancer Mother 64    colon  . Cancer Paternal Aunt     DRUG ALLERGIES:   Allergies  Allergen Reactions  . Peanut Oil Other (See Comments)    Face  turns red  . Risperidone And Related Cough    REVIEW OF SYSTEMS:  Unable to give any review of systems at this time secondary to altered mental status  MEDICATIONS AT HOME:   Prior to Admission medications   Medication Sig Start Date End Date Taking? Authorizing Provider  acidophilus (RISAQUAD) CAPS capsule Take 2 capsules by mouth 3 (three) times daily.   Yes Historical Provider, MD  albuterol (PROVENTIL HFA;VENTOLIN HFA) 108 (90 Base) MCG/ACT inhaler Inhale into the lungs every 6 (six) hours as needed for wheezing or shortness of breath.   Yes Historical Provider, MD  Amino Acids-Protein Hydrolys (FEEDING SUPPLEMENT, PRO-STAT SUGAR FREE 64,) LIQD Take 30 mLs by mouth 3 (three) times daily with meals.   Yes Historical Provider, MD  bisacodyl (DULCOLAX) 5 MG EC tablet Take 5 mg by mouth daily as needed for moderate constipation.   Yes Historical Provider, MD  calcium carbonate (OSCAL) 1500 (600 Ca) MG TABS tablet Take 1,500 mg by mouth 2 (two) times daily with a meal.   Yes Historical Provider, MD  Cholecalciferol (VITAMIN D3) 5000 units CAPS Take 5,000 Units by mouth daily.   Yes Historical Provider, MD  cholestyramine light (PREVALITE) 4 g packet Take 4 g by mouth 2 (two) times daily.   Yes Historical Provider, MD  citalopram (CELEXA) 40 MG tablet Take 40 mg by mouth daily.   Yes Historical Provider, MD  diphenhydrAMINE (BENADRYL) 25 MG tablet Take 50 mg by mouth at bedtime as needed.   Yes Historical Provider, MD  diphenoxylate-atropine (LOMOTIL) 2.5-0.025 MG tablet Take 2 tablets by mouth every 2 (two) hours as needed for diarrhea or loose stools.   Yes Historical Provider, MD  gemfibrozil (LOPID) 600 MG tablet Take 600 mg by mouth 2 (two) times daily before a meal.   Yes Historical Provider, MD  hyoscyamine (LEVSIN, ANASPAZ) 0.125 MG tablet Take 0.125 mg by mouth every 6 (six) hours as needed.   Yes Historical Provider, MD  levofloxacin (LEVAQUIN) 500 MG tablet Take 500 mg by mouth daily.  03/01/17 03/05/17 Yes Historical Provider, MD  LORazepam (ATIVAN) 1 MG tablet Take 1 mg by mouth daily.   Yes Historical Provider, MD  magnesium oxide (MAGNESIUM-OXIDE) 400 (241.3 Mg) MG tablet Take 400 mg by mouth 2 (two) times daily.   Yes Historical Provider, MD  mesalamine (APRISO) 0.375 g 24 hr capsule Take 1,500 mg by mouth daily.   Yes Historical Provider, MD  midodrine (PROAMATINE) 10 MG tablet Take 10 mg by mouth 4 (four) times daily.   Yes Historical Provider, MD  Multiple Vitamin (MULTIVITAMIN WITH MINERALS) TABS tablet Take 1 tablet by mouth daily.   Yes Historical Provider, MD  omega-3 acid ethyl esters (LOVAZA) 1 g capsule Take 1 g by mouth daily.   Yes Historical Provider, MD  pantoprazole (PROTONIX) 40 MG tablet Take 40 mg by mouth daily.   Yes Historical Provider, MD  potassium chloride SA (K-DUR,KLOR-CON) 20 MEQ tablet Take 20  mEq by mouth 2 (two) times daily.   Yes Historical Provider, MD  pseudoephedrine-guaifenesin (MUCINEX D) 60-600 MG 12 hr tablet Take 1 tablet by mouth every 12 (twelve) hours as needed for congestion.   Yes Historical Provider, MD  simvastatin (ZOCOR) 20 MG tablet Take 20 mg by mouth daily.   Yes Historical Provider, MD  sucralfate (CARAFATE) 1 g tablet Take 1 g by mouth 4 (four) times daily -  with meals and at bedtime.   Yes Historical Provider, MD  vitamin B-12 (CYANOCOBALAMIN) 1000 MCG tablet Take 1,000 mcg by mouth daily.   Yes Historical Provider, MD  vitamin C (ASCORBIC ACID) 250 MG tablet Take 500 mg by mouth daily.   Yes Historical Provider, MD      VITAL SIGNS:  Blood pressure 112/88, pulse 96, temperature 98.1 F (36.7 C), temperature source Oral, resp. rate 14, height 5\' 6"  (1.676 m), weight 123.4 kg (272 lb), SpO2 100 %.  PHYSICAL EXAMINATION:  GENERAL:  45 y.o.-year-old patient lying in the bed with no acute distress.  EYES: Pupils equal, round, reactive to light and accommodation. No scleral icterus.  HEENT: Head atraumatic, normocephalic.  Oropharynx and nasopharynx clear.  NECK:  Supple, no jugular venous distention. No thyroid enlargement, no tenderness.  LUNGS: Normal breath sounds bilaterally, no wheezing, rales,rhonchi or crepitation. No use of accessory muscles of respiration.  CARDIOVASCULAR: S1, S2 normal. No murmurs, rubs, or gallops.  ABDOMEN: Soft, nontender, distended. Umbilical hernia reducible Bowel sounds present. No organomegaly or mass.  EXTREMITIES: 3+ edema. No cyanosis, or clubbing.  NEUROLOGIC: Patient is alert but she is not following commands. Able to move her arms on her own. PSYCHIATRIC: The patient is alert but can even answer questions at this time.  SKIN: Weeping edema lower extremity with some blistering  LABORATORY PANEL:   CBC  Recent Labs Lab 03/02/17 1600  WBC 12.9*  HGB 9.7*  HCT 30.0*  PLT 521*   ------------------------------------------------------------------------------------------------------------------  Chemistries   Recent Labs Lab 03/02/17 1600  NA 130*  K 5.5*  CL 103  CO2 18*  GLUCOSE 75  BUN 8  CREATININE 1.36*  CALCIUM 7.0*  AST 67*  ALT 50  ALKPHOS 136*  BILITOT 0.5   ------------------------------------------------------------------------------------------------------------------  Cardiac Enzymes  Recent Labs Lab 03/02/17 1600  TROPONINI <0.03   ------------------------------------------------------------------------------------------------------------------  RADIOLOGY:  Ct Head Wo Contrast  Result Date: 03/02/2017 CLINICAL DATA:  Altered mental status, on Levaquin for urinary tract infection. History of schizophrenia, traumatic brain injury, diabetes. EXAM: CT HEAD WITHOUT CONTRAST TECHNIQUE: Contiguous axial images were obtained from the base of the skull through the vertex without intravenous contrast. COMPARISON:  CT HEAD September 27, 2016 FINDINGS: BRAIN: No intraparenchymal hemorrhage, mass effect nor midline shift. The ventricles and sulci  are normal. No acute large vascular territory infarcts. No abnormal extra-axial fluid collections. Basal cisterns are patent. Symmetric dural calcifications. VASCULAR: Unremarkable. SKULL/SOFT TISSUES: No skull fracture. No significant soft tissue swelling. Frontal scalp scarring and scattered tricholemmal cysts. ORBITS/SINUSES: The included ocular globes and orbital contents are normal.The mastoid aircells and included paranasal sinuses are well-aerated. OTHER: None. IMPRESSION: Negative CT HEAD. Electronically Signed   By: Elon Alas M.D.   On: 03/02/2017 17:34   Dg Chest Portable 1 View  Result Date: 03/02/2017 CLINICAL DATA:  Altered mental status . EXAM: PORTABLE CHEST 1 VIEW COMPARISON:  02/14/2017. FINDINGS: Heart size normal. Low lung volumes. No focal infiltrate. No pleural effusion or pneumothorax. No acute bony abnormality . IMPRESSION: Low lung volumes.  No acute cardiopulmonary disease. Electronically Signed   By: Marcello Moores  Register   On: 03/02/2017 16:32    EKG:   Low voltage  IMPRESSION AND PLAN:   1. Hyperammonemia with metabolic encephalopathy. Unclear cause at this point. I will have to give lactulose 4 times a day to get the ammonia level down. Recheck ammonia level tomorrow morning and reassess mental status. Proteus urinary tract infections potentially can cause this. Empiric Rocephin urine follow-up urine culture. We'll have to evaluate the liver with imaging studies Olguin in ultrasound the liver tomorrow morning. Check hepatitis flow files in the morning. 2. Hyponatremia. Gentle IV fluid hydration 3. Hyperkalemia will come down with IV fluid hydration and the diarrhea with the lactulose. Stop potassium replacement 4. Hyperlipidemia unspecified stop Zocor and TriCor. And on a CPK 5. GERD on Protonix 6. Schizophrenia on Celexa, rice. Own and Ativan 7. Thrombocytosis seems chronic in nature. Prior CBC showing smudge cells which could be an underlying CLL 8. Anemia sendoff  a ferritin 9. History of regional enteritis    All the records are reviewed and case discussed with ED provider. Management plans discussed with the patient, family and they are in agreement.  CODE STATUS: Full code  TOTAL TIME TAKING CARE OF THIS PATIENT: 50 minutes.    Loletha Grayer M.D on 03/02/2017 at 7:13 PM  Between 7am to 6pm - Pager - [phone]  After 6pm call admission pager [phone]  [facility] Office  [phone]  CC: Primary care physician; Dr. Juluis Pitch at [facility]

## 2017-03-02 NOTE — ED Notes (Signed)
bp rechecked and validated.

## 2017-03-02 NOTE — ED Notes (Signed)
Put in iv team consult per dr request. Dr was unable to establish iv with ultrasound

## 2017-03-02 NOTE — ED Triage Notes (Signed)
Per [facility] pt having more bm's then her normal and change in ms. Pt dx with uti yesterday and placed on levaquin. Pt responds to her name but does not answer questions. Pt pale.

## 2017-03-02 NOTE — ED Notes (Signed)
Iv established by iv team. Fluids and antibiotics started. Hold lactulose at this time d/t pt gagging (not vomiting)

## 2017-03-02 NOTE — ED Notes (Signed)
Dr Joni Fears in with pt to establish [place] iv.

## 2017-03-02 NOTE — ED Provider Notes (Signed)
[facility] Emergency Department Provider Note  ____________________________________________  Time seen: Approximately 6:36 PM  I have reviewed the triage vital signs and the nursing notes.   HISTORY  Chief Complaint Altered Mental Status  Level 5 caveat:  Portions of the history and physical were unable to be obtained due to the patient's altered mental status  HPI [name] is a 46 y.o. female brought to the ED from nursing home due to altered mental status.Patient is not responding verbally and unable to provide any history. Per nursing home report via EMS, symptoms have developed over the past 24 hours. Patient has a history of recurrent UTIs.     Past Medical History:  Diagnosis Date  . Abnormal mammogram, unspecified 2013   Prev. cytology,hypercellular smears without evidence of malignant cells. The cytopathologist questioned if samples truly representative. Care taken during sampling and is felt to be representative.  . Breast screening, unspecified 2013  . Broken leg    age 56  . Cellulitis   . Crohn's disease ([place]) 2013  . Depression   . Diabetes mellitus without complication (HCC)    non insulin dependent  . Early menopause   . Edema   . GERD (gastroesophageal reflux disease)   . Hiatal hernia 2013  . Hyperlipidemia   . Hypertension   . Hypothyroidism   . 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 Obesity, unspecified 2013  . Other sign and symptom in breast 2013   Right bst US,lower outer quadrant,A single 0.3x0.4x0.6cm hypoechoic mass with slightly lobulated borders with adjacent 0.3x0.4x0.5cm mass was noted. The 1st was 5cm from nipple, 2nd at 8 cm from the nipple. The previous lesion aspirate was at 3 0'clock position. These lesions are thought to account for the mammographic abnormality. Minimal interval change on [place].   [facility] enteritis [facility])   . Rib fracture    age 39  . Schizophrenia ([place])   . TBI  (traumatic brain injury) ([place])   . Thyroid disease    hypothyroid  . Vitamin D deficiency      Patient Active Problem List   Diagnosis Date Noted  . Sepsis secondary to UTI ([place]) 02/13/2017  . Hypotension 01/23/2017  . Scalp laceration   . Pressure injury of skin 10/02/2016  . 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 Acute renal failure (ARF) ([place])   . Respiratory failure ([place]) 09/27/2016  . Other sign and symptom in breast      Past Surgical History:  Procedure Laterality Date  . COLONOSCOPY WITH PROPOFOL N/A 02/16/2016   Procedure: COLONOSCOPY WITH PROPOFOL;  Surgeon: Lollie Sails, MD;  Location: [facility] ENDOSCOPY;  Service: Endoscopy;  Laterality: N/A;  . COLONOSCOPY WITH PROPOFOL N/A 02/22/2016   Procedure: COLONOSCOPY WITH PROPOFOL;  Surgeon: Lollie Sails, MD;  Location: [facility] ENDOSCOPY;  Service: Endoscopy;  Laterality: N/A;  . COLONOSCOPY WITH PROPOFOL N/A 10/14/2016   Procedure: COLONOSCOPY WITH PROPOFOL;  Surgeon: Jonathon Bellows, MD;  Location: [facility] ENDOSCOPY;  Service: Endoscopy;  Laterality: N/A;  . EYE SURGERY Left 2013   cataract surgery  . PERIPHERAL VASCULAR CATHETERIZATION N/A 10/13/2016   Procedure: Dialysis/Perma Catheter Insertion;  Surgeon: Algernon Huxley, MD;  Location: [place] CV LAB;  Service: Cardiovascular;  Laterality: N/A;  . PERIPHERAL VASCULAR CATHETERIZATION N/A 10/18/2016   Procedure: Dialysis/Perma Catheter Removal;  Surgeon: Katha Cabal, MD;  Location: [place] CV LAB;  Service: Cardiovascular;  Laterality: N/A;  . TUMOR EXCISION  Right    age of 17, tumor removed from [place]     Prior to Admission medications   Medication Sig Start Date End Date Taking? Authorizing Provider  acidophilus (RISAQUAD) CAPS capsule Take 2 capsules by mouth 3 (three) times daily.   Yes Historical Provider, MD  albuterol (PROVENTIL HFA;VENTOLIN HFA) 108 (90 Base) MCG/ACT inhaler Inhale into the lungs every 6 (six) hours as needed for wheezing or  shortness of breath.   Yes Historical Provider, MD  Amino Acids-Protein Hydrolys (FEEDING SUPPLEMENT, PRO-STAT SUGAR FREE 64,) LIQD Take 30 mLs by mouth 3 (three) times daily with meals.   Yes Historical Provider, MD  bisacodyl (DULCOLAX) 5 MG EC tablet Take 5 mg by mouth daily as needed for moderate constipation.   Yes Historical Provider, MD  calcium carbonate (OSCAL) 1500 (600 Ca) MG TABS tablet Take 1,500 mg by mouth 2 (two) times daily with a meal.   Yes Historical Provider, MD  Cholecalciferol (VITAMIN D3) 5000 units CAPS Take 5,000 Units by mouth daily.   Yes Historical Provider, MD  cholestyramine light (PREVALITE) 4 g packet Take 4 g by mouth 2 (two) times daily.   Yes Historical Provider, MD  citalopram (CELEXA) 40 MG tablet Take 40 mg by mouth daily.   Yes Historical Provider, MD  diphenhydrAMINE (BENADRYL) 25 MG tablet Take 50 mg by mouth at bedtime as needed.   Yes Historical Provider, MD  diphenoxylate-atropine (LOMOTIL) 2.5-0.025 MG tablet Take 2 tablets by mouth every 2 (two) hours as needed for diarrhea or loose stools.   Yes Historical Provider, MD  gemfibrozil (LOPID) 600 MG tablet Take 600 mg by mouth 2 (two) times daily before a meal.   Yes Historical Provider, MD  hyoscyamine (LEVSIN, ANASPAZ) 0.125 MG tablet Take 0.125 mg by mouth every 6 (six) hours as needed.   Yes Historical Provider, MD  levofloxacin (LEVAQUIN) 500 MG tablet Take 500 mg by mouth daily. 03/01/17 03/05/17 Yes Historical Provider, MD  LORazepam (ATIVAN) 1 MG tablet Take 1 mg by mouth daily.   Yes Historical Provider, MD  magnesium oxide (MAGNESIUM-OXIDE) 400 (241.3 Mg) MG tablet Take 400 mg by mouth 2 (two) times daily.   Yes Historical Provider, MD  mesalamine (APRISO) 0.375 g 24 hr capsule Take 1,500 mg by mouth daily.   Yes Historical Provider, MD  midodrine (PROAMATINE) 10 MG tablet Take 10 mg by mouth 4 (four) times daily.   Yes Historical Provider, MD  Multiple Vitamin (MULTIVITAMIN WITH MINERALS) TABS  tablet Take 1 tablet by mouth daily.   Yes Historical Provider, MD  omega-3 acid ethyl esters (LOVAZA) 1 g capsule Take 1 g by mouth daily.   Yes Historical Provider, MD  pantoprazole (PROTONIX) 40 MG tablet Take 40 mg by mouth daily.   Yes Historical Provider, MD  potassium chloride SA (K-DUR,KLOR-CON) 20 MEQ tablet Take 20 mEq by mouth 2 (two) times daily.   Yes Historical Provider, MD  pseudoephedrine-guaifenesin (MUCINEX D) 60-600 MG 12 hr tablet Take 1 tablet by mouth every 12 (twelve) hours as needed for congestion.   Yes Historical Provider, MD  simvastatin (ZOCOR) 20 MG tablet Take 20 mg by mouth daily.   Yes Historical Provider, MD  sucralfate (CARAFATE) 1 g tablet Take 1 g by mouth 4 (four) times daily -  with meals and at bedtime.   Yes Historical Provider, MD  vitamin B-12 (CYANOCOBALAMIN) 1000 MCG tablet Take 1,000 mcg by mouth daily.   Yes Historical Provider, MD  vitamin C (ASCORBIC  ACID) 250 MG tablet Take 500 mg by mouth daily.   Yes Historical Provider, MD     Allergies Peanut oil and Risperidone and related   Family History  Problem Relation Age of Onset  . Cancer Mother 67    colon  . Cancer Paternal Aunt     Social History Social History  Substance Use Topics  . Smoking status: Never Smoker  . Smokeless tobacco: Never Used  . Alcohol use No    Review of Systems Unable to obtain due to altered mental status ____________________________________________   PHYSICAL EXAM:  VITAL SIGNS: ED Triage Vitals  Enc Vitals Group     BP 03/02/17 1438 96/71     Pulse Rate 03/02/17 1438 96     Resp 03/02/17 1438 14     Temp 03/02/17 1438 98.1 F (36.7 C)     Temp Source 03/02/17 1438 Oral     SpO2 03/02/17 1438 100 %     Weight 03/02/17 1440 272 lb (123.4 kg)     Height 03/02/17 1440 5\' 6"  (1.676 m)     Head Circumference --      [place] --      Pain Score 03/02/17 1438 0     Pain Loc --      Pain Edu? --      Excl. in [place]? --     Vital signs reviewed,  nursing assessments reviewed.   Constitutional:   Alert , confused and unresponsive. Not in distress Eyes:   No scleral icterus. No conjunctival pallor. PERRL. EOMI.  No nystagmus. ENT   Head:   Normocephalic and atraumatic.   Nose:   No congestion/rhinnorhea. No septal hematoma   Mouth/Throat:   Dry mucous membranes, no pharyngeal erythema. No peritonsillar mass.    Neck:   No stridor. No SubQ emphysema. No meningismus. Hematological/Lymphatic/Immunilogical:   No cervical lymphadenopathy. Cardiovascular:   RRR. Symmetric bilateral radial and DP pulses.  No murmurs. Point of care ultrasound performed by me does not show any significant pericardial effusion in subxiphoid and parasternal long axis views.Marland Kitchen Respiratory:   Normal respiratory effort without tachypnea nor retractions. Breath sounds are clear and equal bilaterally. No wheezes/rales/rhonchi. Gastrointestinal:   Soft and nontender. Non distended. There is no CVA tenderness.  No rebound, rigidity, or guarding. Genitourinary:   deferred Musculoskeletal:   Normal range of motion in all extremities. No joint effusions.  No lower extremity tenderness.  2+ pitting edema bilateral lower extremities. There is edema in the upper extremities as well.. Neurologic:   No verbal responses.  CN 2-10 normal. Motor grossly intact. Moving all extremities No gross focal neurologic deficits are appreciated.  Skin:    Skin is warm, dry and intact. No rash noted.  No petechiae, purpura, or bullae.  ____________________________________________    LABS (pertinent positives/negatives) (all labs ordered are listed, but only abnormal results are displayed) Labs Reviewed  COMPREHENSIVE METABOLIC PANEL - Abnormal; Notable for the following:       Result Value   Sodium 130 (*)    Potassium 5.5 (*)    CO2 18 (*)    Creatinine, Ser 1.36 (*)    Calcium 7.0 (*)    Total Protein 3.9 (*)    Albumin <1.0 (*)    AST 67 (*)    Alkaline Phosphatase  136 (*)    GFR calc non Af Amer 46 (*)    GFR calc Af Amer 54 (*)    All other components within normal  limits  ACETAMINOPHEN LEVEL - Abnormal; Notable for the following:    Acetaminophen (Tylenol), Serum <10 (*)    All other components within normal limits  LIPASE, BLOOD - Abnormal; Notable for the following:    Lipase <10 (*)    All other components within normal limits  CBC WITH DIFFERENTIAL/PLATELET - Abnormal; Notable for the following:    WBC 12.9 (*)    RBC 3.19 (*)    Hemoglobin 9.7 (*)    HCT 30.0 (*)    RDW 16.0 (*)    Platelets 521 (*)    Lymphs Abs 5.7 (*)    All other components within normal limits  LACTIC ACID, PLASMA - Abnormal; Notable for the following:    Lactic Acid, Venous 3.3 (*)    All other components within normal limits  AMMONIA - Abnormal; Notable for the following:    Ammonia 94 (*)    All other components within normal limits  URINALYSIS, COMPLETE (UACMP) WITH MICROSCOPIC - Abnormal; Notable for the following:    Color, Urine YELLOW (*)    APPearance CLEAR (*)    Squamous Epithelial / LPF 0-5 (*)    All other components within normal limits  URINE DRUG SCREEN, QUALITATIVE ([facility] ONLY) - Abnormal; Notable for the following:    Opiate, Ur Screen POSITIVE (*)    All other components within normal limits  URINE CULTURE  ETHANOL  SALICYLATE LEVEL  TROPONIN I  LACTIC ACID, PLASMA   ____________________________________________   EKG  Interpreted by me Sinus tachycardia rate 117. Diffuse low voltage. No acute ischemic changes.  ____________________________________________    RADIOLOGY  Ct Head Wo Contrast  Result Date: 03/02/2017 CLINICAL DATA:  Altered mental status, on Levaquin for urinary tract infection. History of schizophrenia, traumatic brain injury, diabetes. EXAM: CT HEAD WITHOUT CONTRAST TECHNIQUE: Contiguous axial images were obtained from the base of the skull through the vertex without intravenous contrast. COMPARISON:  CT HEAD  September 27, 2016 FINDINGS: BRAIN: No intraparenchymal hemorrhage, mass effect nor midline shift. The ventricles and sulci are normal. No acute large vascular territory infarcts. No abnormal extra-axial fluid collections. Basal cisterns are patent. Symmetric dural calcifications. VASCULAR: Unremarkable. SKULL/SOFT TISSUES: No skull fracture. No significant soft tissue swelling. Frontal scalp scarring and scattered tricholemmal cysts. ORBITS/SINUSES: The included ocular globes and orbital contents are normal.The mastoid aircells and included paranasal sinuses are well-aerated. OTHER: None. IMPRESSION: Negative CT HEAD. Electronically Signed   By: Elon Alas M.D.   On: 03/02/2017 17:34   Dg Chest Portable 1 View  Result Date: 03/02/2017 CLINICAL DATA:  Altered mental status . EXAM: PORTABLE CHEST 1 VIEW COMPARISON:  02/14/2017. FINDINGS: Heart size normal. Low lung volumes. No focal infiltrate. No pleural effusion or pneumothorax. No acute bony abnormality . IMPRESSION: Low lung volumes.  No acute cardiopulmonary disease. Electronically Signed   By: Marcello Moores  Register   On: 03/02/2017 16:32    ____________________________________________   PROCEDURES Procedures CRITICAL CARE Performed by: Carrie Mew   Total critical care time: 35 minutes  Critical care time was exclusive of separately billable procedures and treating other patients.  Critical care was necessary to treat or prevent imminent or life-threatening deterioration.  Critical care was time spent personally by me on the following activities: development of treatment plan with patient and/or surrogate as well as nursing, discussions with consultants, evaluation of patient's response to treatment, examination of patient, obtaining history from patient or surrogate, ordering and performing treatments and interventions, ordering and review of laboratory studies,  ordering and review of radiographic studies, pulse oximetry and  re-evaluation of patient's condition.  ____________________________________________   INITIAL IMPRESSION / ASSESSMENT AND PLAN / ED COURSE  Pertinent labs & imaging results that were available during my care of the patient were reviewed by me and considered in my medical decision making (see chart for details).     Clinical Course as of Mar 03 1843  Thu Mar 02, 2017  1608 Patient presents with altered mental status, awake. Apparently not alert, not following commands, not responding. We'll check CT head and labs.  [PS]  1843 Lactate returned elevated at 3.3, raising concern for sepsis. IV fluids and empiric antibiotics initiated. Blood pressure later decreased to about 70/50, with fluid boluses has improved to 110/70.  [PS]  2080 Case discussed with hospitalist to admit for hepatic encephalopathy.  [PS]    Clinical Course User Index [PS] Carrie Mew, MD     ____________________________________________   FINAL CLINICAL IMPRESSION(S) / ED DIAGNOSES  Final diagnoses:  Stupor  Hepatic encephalopathy ([place])  Elevated lactic acid level      New Prescriptions   No medications on file     Portions of this note were generated with dragon dictation software. Dictation errors may occur despite best attempts at proofreading.    Carrie Mew, MD 03/02/17 [phone]

## 2017-03-03 ENCOUNTER — Inpatient Hospital Stay: Payer: Medicare Other

## 2017-03-03 DIAGNOSIS — R601 Generalized edema: Secondary | ICD-10-CM

## 2017-03-03 DIAGNOSIS — E7220 Disorder of urea cycle metabolism, unspecified: Principal | ICD-10-CM

## 2017-03-03 DIAGNOSIS — K7682 Hepatic encephalopathy: Secondary | ICD-10-CM

## 2017-03-03 DIAGNOSIS — E8809 Other disorders of plasma-protein metabolism, not elsewhere classified: Secondary | ICD-10-CM

## 2017-03-03 DIAGNOSIS — K7290 Hepatic failure, unspecified without coma: Secondary | ICD-10-CM

## 2017-03-03 DIAGNOSIS — R579 Shock, unspecified: Secondary | ICD-10-CM

## 2017-03-03 DIAGNOSIS — I959 Hypotension, unspecified: Secondary | ICD-10-CM

## 2017-03-03 DIAGNOSIS — R7989 Other specified abnormal findings of blood chemistry: Secondary | ICD-10-CM

## 2017-03-03 LAB — BASIC METABOLIC PANEL
Anion gap: 3 — ABNORMAL LOW (ref 5–15)
BUN: 8 mg/dL (ref 6–20)
CHLORIDE: 112 mmol/L — AB (ref 101–111)
CO2: 21 mmol/L — ABNORMAL LOW (ref 22–32)
Calcium: 6.7 mg/dL — ABNORMAL LOW (ref 8.9–10.3)
Creatinine, Ser: 1.14 mg/dL — ABNORMAL HIGH (ref 0.44–1.00)
GFR calc Af Amer: >60 mL/min (ref >60)
GFR calc non Af Amer: 57 mL/min — ABNORMAL LOW (ref >60)
GLUCOSE: 72 mg/dL (ref 65–99)
POTASSIUM: 3.6 mmol/L (ref 3.5–5.1)
SODIUM: 136 mmol/L (ref 135–145)

## 2017-03-03 LAB — CBC WITH DIFFERENTIAL/PLATELET
BASOS ABS: 0 10*3/uL (ref 0–0.1)
BASOS PCT: 0 %
Eosinophils Absolute: 0.1 10*3/uL (ref 0–0.7)
Eosinophils Relative: 1 %
HEMATOCRIT: 27.2 % — AB (ref 35.0–47.0)
HEMOGLOBIN: 9.2 g/dL — AB (ref 12.0–16.0)
LYMPHS PCT: 42 %
Lymphs Abs: 3.9 10*3/uL — ABNORMAL HIGH (ref 1.0–3.6)
MCH: 31.5 pg (ref 26.0–34.0)
MCHC: 33.7 g/dL (ref 32.0–36.0)
MCV: 93.6 fL (ref 80.0–100.0)
Monocytes Absolute: 0.5 10*3/uL (ref 0.2–0.9)
Monocytes Relative: 5 %
NEUTROS PCT: 52 %
Neutro Abs: 4.8 10*3/uL (ref 1.4–6.5)
Platelets: 472 10*3/uL — ABNORMAL HIGH (ref 150–440)
RBC: 2.9 MIL/uL — AB (ref 3.80–5.20)
RDW: 15.8 % — ABNORMAL HIGH (ref 11.5–14.5)
WBC: 9.3 10*3/uL (ref 3.6–11.0)

## 2017-03-03 LAB — COMPREHENSIVE METABOLIC PANEL
ALBUMIN: 1.3 g/dL — AB (ref 3.5–5.0)
ALT: 45 U/L (ref 14–54)
ANION GAP: 4 — AB (ref 5–15)
AST: 43 U/L — ABNORMAL HIGH (ref 15–41)
Alkaline Phosphatase: 107 U/L (ref 38–126)
BILIRUBIN TOTAL: 0.4 mg/dL (ref 0.3–1.2)
BUN: 7 mg/dL (ref 6–20)
CO2: 20 mmol/L — AB (ref 22–32)
Calcium: 6.7 mg/dL — ABNORMAL LOW (ref 8.9–10.3)
Chloride: 111 mmol/L (ref 101–111)
Creatinine, Ser: 1.02 mg/dL — ABNORMAL HIGH (ref 0.44–1.00)
GFR calc non Af Amer: >60 mL/min (ref >60)
GLUCOSE: 76 mg/dL (ref 65–99)
POTASSIUM: 2.7 mmol/L — AB (ref 3.5–5.1)
SODIUM: 135 mmol/L (ref 135–145)
TOTAL PROTEIN: 3.4 g/dL — AB (ref 6.5–8.1)

## 2017-03-03 LAB — MAGNESIUM: Magnesium: 1.6 mg/dL — ABNORMAL LOW (ref 1.7–2.4)

## 2017-03-03 LAB — CBC
HCT: 23.6 % — ABNORMAL LOW (ref 35.0–47.0)
Hemoglobin: 7.7 g/dL — ABNORMAL LOW (ref 12.0–16.0)
MCH: 30.7 pg (ref 26.0–34.0)
MCHC: 32.7 g/dL (ref 32.0–36.0)
MCV: 93.8 fL (ref 80.0–100.0)
PLATELETS: 387 10*3/uL (ref 150–440)
RBC: 2.52 MIL/uL — AB (ref 3.80–5.20)
RDW: 15.6 % — AB (ref 11.5–14.5)
WBC: 9.6 10*3/uL (ref 3.6–11.0)

## 2017-03-03 LAB — URINE CULTURE: CULTURE: NO GROWTH

## 2017-03-03 LAB — CORTISOL: CORTISOL PLASMA: 14.1 ug/dL

## 2017-03-03 LAB — LACTIC ACID, PLASMA
LACTIC ACID, VENOUS: 1.1 mmol/L (ref 0.5–1.9)
Lactic Acid, Venous: 1.1 mmol/L (ref 0.5–1.9)

## 2017-03-03 LAB — GLUCOSE, CAPILLARY: Glucose-Capillary: 80 mg/dL (ref 65–99)

## 2017-03-03 LAB — TSH: TSH: 5.374 u[IU]/mL — AB (ref 0.350–4.500)

## 2017-03-03 LAB — AMMONIA
Ammonia: 70 umol/L — ABNORMAL HIGH (ref 9–35)
Ammonia: 70 umol/L — ABNORMAL HIGH (ref 9–35)

## 2017-03-03 LAB — FERRITIN: FERRITIN: 564 ng/mL — AB (ref 11–307)

## 2017-03-03 LAB — PROTIME-INR
INR: 1.43
PROTHROMBIN TIME: 17.6 s — AB (ref 11.4–15.2)

## 2017-03-03 LAB — PROCALCITONIN: PROCALCITONIN: 0.55 ng/mL

## 2017-03-03 LAB — PHOSPHORUS: Phosphorus: 3.5 mg/dL (ref 2.5–4.6)

## 2017-03-03 MED ORDER — METOCLOPRAMIDE HCL 5 MG/ML IJ SOLN
5.0000 mg | Freq: Four times a day (QID) | INTRAMUSCULAR | Status: DC | PRN
Start: 2017-03-03 — End: 2017-03-07
  Administered 2017-03-04: 5 mg via INTRAVENOUS
  Filled 2017-03-03: qty 2

## 2017-03-03 MED ORDER — NOREPINEPHRINE BITARTRATE 1 MG/ML IV SOLN
0.0000 ug/min | INTRAVENOUS | Status: DC
  Administered 2017-03-03: 12 ug/min via INTRAVENOUS
  Administered 2017-03-04: 25 ug/min via INTRAVENOUS
  Filled 2017-03-03 (×2): qty 16

## 2017-03-03 MED ORDER — FLUDROCORTISONE ACETATE 0.1 MG PO TABS
0.1000 mg | ORAL_TABLET | Freq: Every day | ORAL | Status: DC
  Administered 2017-03-03: 10:00:00 0.1 mg via ORAL
  Filled 2017-03-03 (×2): qty 1

## 2017-03-03 MED ORDER — POTASSIUM CHLORIDE 10 MEQ/50ML IV SOLN
10.0000 meq | INTRAVENOUS | Status: AC
  Administered 2017-03-03 – 2017-03-04 (×4): 10 meq via INTRAVENOUS
  Filled 2017-03-03 (×4): qty 50

## 2017-03-03 MED ORDER — ALBUMIN HUMAN 25 % IV SOLN
25.0000 g | Freq: Once | INTRAVENOUS | Status: AC
  Administered 2017-03-03: 25 g via INTRAVENOUS
  Filled 2017-03-03 (×2): qty 100

## 2017-03-03 MED ORDER — STERILE WATER FOR INJECTION IJ SOLN
INTRAMUSCULAR | Status: AC
  Administered 2017-03-03: 1 mL
  Filled 2017-03-03: qty 10

## 2017-03-03 MED ORDER — SODIUM CHLORIDE 0.9 % IV SOLN
INTRAVENOUS | Status: DC
Start: 2017-03-03 — End: 2017-03-04
  Administered 2017-03-03 – 2017-03-04 (×4): via INTRAVENOUS

## 2017-03-03 MED ORDER — MAGNESIUM SULFATE 2 GM/50ML IV SOLN
2.0000 g | Freq: Once | INTRAVENOUS | Status: AC
  Administered 2017-03-03: 2 g via INTRAVENOUS
  Filled 2017-03-03: qty 50

## 2017-03-03 MED ORDER — SODIUM CHLORIDE 0.9 % IV BOLUS (SEPSIS)
1000.0000 mL | Freq: Once | INTRAVENOUS | Status: AC
  Administered 2017-03-03: 1000 mL via INTRAVENOUS

## 2017-03-03 MED ORDER — COSYNTROPIN 0.25 MG IJ SOLR
0.2500 mg | Freq: Once | INTRAMUSCULAR | Status: AC
  Administered 2017-03-03: 0.25 mg via INTRAVENOUS
  Filled 2017-03-03: qty 0.25

## 2017-03-03 MED ORDER — LEVOTHYROXINE SODIUM 25 MCG PO TABS
25.0000 ug | ORAL_TABLET | Freq: Every day | ORAL | Status: DC
  Administered 2017-03-04: 25 ug via ORAL
  Filled 2017-03-03: qty 1

## 2017-03-03 MED ORDER — SODIUM CHLORIDE 0.9 % IV SOLN
Freq: Once | INTRAVENOUS | Status: AC
  Administered 2017-03-04: via INTRAVENOUS

## 2017-03-03 MED ORDER — COSYNTROPIN 0.25 MG IJ SOLR: 0.2500 mg | Freq: Once | INTRAMUSCULAR | Status: DC

## 2017-03-03 MED ORDER — FUROSEMIDE 10 MG/ML IJ SOLN
20.0000 mg | Freq: Once | INTRAMUSCULAR | Status: AC
  Administered 2017-03-03: 20 mg via INTRAVENOUS
  Filled 2017-03-03: qty 2

## 2017-03-03 MED ORDER — SODIUM BICARBONATE 8.4 % IV SOLN
150.0000 meq | Freq: Once | INTRAVENOUS | Status: AC
  Administered 2017-03-03: 150 meq via INTRAVENOUS
  Filled 2017-03-03: qty 150

## 2017-03-03 MED ORDER — ALBUMIN HUMAN 25 % IV SOLN
25.0000 g | Freq: Once | INTRAVENOUS | Status: AC
  Administered 2017-03-03: 25 g via INTRAVENOUS
  Filled 2017-03-03: qty 100

## 2017-03-03 NOTE — Progress Notes (Signed)
   03/03/17 2200  Vitals  Temp (!) 96.5 F (35.8 C)  Temp Source Axillary  BP 93/63  MAP (mmHg) 73  Pulse Rate 96  ECG Heart Rate 95  Resp 18  Oxygen Therapy  SpO2 100 %    Warm blanket applied NP aware of potassium results

## 2017-03-03 NOTE — Progress Notes (Signed)
Report given to Chrys Racer, RN who is now taking over patient's care.  Current MAP 66.  Blood pressures have fluctuated systolicaly from 95'F to 69'Q since coming to ICU from [facility].  Chrys Racer, RN is going to speak with Dr. Alva Garnet regarding patient's blood pressure goals.  Patient is alert and mentating with no fluctuation in mental status with blood pressure fluctuations.

## 2017-03-03 NOTE — Clinical Social Work Note (Signed)
Clinical Social Work Assessment  Patient Details  Name: ANJANI FEUERBORN MRN: [id number] Date of Birth: 20-Apr-1971  Date of referral:  03/03/17               Reason for consult:  Facility Placement, Discharge Planning                Permission sought to share information with:  Family Supports Permission granted to share information::  Yes, Verbal Permission Granted  Name::     [name]  Relationship::  Aunt  Contact Information:  [phone] (h) or [phone] (c)  Housing/Transportation Living arrangements for the past 2 months:  [place], [place] of Information:  Patient, Other (Comment Required) Engineer, petroleum) Patient Interpreter Needed:  None Criminal Activity/Legal Involvement Pertinent to Current Situation/Hospitalization:  No - Comment as needed Significant Relationships:  Other Family Members Lives with:  Facility Resident Do you feel safe going back to the place where you live?  Yes Need for family participation in patient care:  Yes (Comment)  Care giving concerns:  No care giving concerns identified.   Social Worker assessment / plan:  CSW spoke with pt's aunt to address consult. Pt was sleeping during conversation with aunt. Pt was admitted from [facility] where she is STR. Pt is a resident of [facility], where she will return after STR. CSW introduced herself and explained role of social work. CSW also explained process of discharging and returning to SNF. Pt's aunt is agreeable to discharge plan. CSW also met with pt briefly, as she woke up. CSW informed her that her autn was going to call into the room. CSW also spoke with [facility] and pt is able to return when stable. CSW will continue to follow.   Employment status:  Retired Forensic scientist:  Information systems manager, Medicaid In [place] PT Recommendations:  Not assessed at this time Information / Referral to community resources:   ([facility])  Patient/Family's  Response to care:  Pt's aunt was appreciative of CSW support.  Patient/Family's Understanding of and Emotional Response to Diagnosis, Current Treatment, and Prognosis:  Pt's aunt understands that pt will return to SNF when stable.   Emotional Assessment Appearance:  Appears stated age Attitude/Demeanor/Rapport:   (Appropriate) Affect (typically observed):  Adaptable, Accepting Orientation:  Oriented to Self, Fluctuating Orientation (Suspected and/or reported [place]) Alcohol / Substance use:  Not Applicable Psych involvement (Current and /or in the community):  No (Comment)  Discharge Needs  Concerns to be addressed:  Adjustment to Illness Readmission within the last 30 days:  Yes Current discharge risk:  Chronically ill Barriers to Discharge:  Continued Medical Work up   [employer], [place] 03/03/2017, 11:25 AM

## 2017-03-03 NOTE — Progress Notes (Signed)
Discussed case with Dr Alva Garnet. Patient with persistet low BP. Unable to get labs for cosyntropin test. More lethargic  Last MAP<50  Will tx to step down Ordered PICC NEeds IVF and pressors Albumin ordered earlier today which she will need

## 2017-03-03 NOTE — Care Management Important Message (Signed)
Important Message  Patient Details  Name: [name] MRN: [id number] Date of Birth: Aug 22, 1971   Medicare Important Message Given:  Yes    Katrina Stack, RN 03/03/2017, 3:44 PM

## 2017-03-03 NOTE — Procedures (Signed)
Right internal jugular central Venous Catheter Insertion Procedure Note [name] [id number] [birth year]  Procedure: Insertion of Central Venous Catheter Indications: Assessment of intravascular volume, Drug and/or fluid administration and Frequent blood sampling  Procedure Details Consent: Risks of procedure as well as the alternatives and risks of each were explained to the (patient/caregiver).  Consent for procedure obtained. Time Out: Verified patient identification, verified procedure, site/side was marked, verified correct patient position, special equipment/implants available, medications/allergies/relevent history reviewed, required imaging and test results available.  Performed  Maximum sterile technique was used including antiseptics, cap, gloves, gown, hand hygiene, mask and sheet. Skin prep: Chlorhexidine; local anesthetic administered A antimicrobial bonded/coated triple lumen catheter was placed in the right internal jugular vein using the Seldinger technique.  Evaluation Blood flow good Complications: No apparent complications Patient did tolerate procedure well. Chest X-ray ordered to verify placement.  CXR: normal.  Procedure performed under direct supervision of Dr.Beth Goodlin. Ultrasound utilized for realtime vessel cannulation  [facility] ANP-BC Pulmonary and Critical Care Medicine [facility] Pager [phone] or [phone]  03/03/2017, 9:58 PM   Kiara Border, MD PCCM service Mobile [phone] Pager [phone] 03/04/2017 2:14 PM

## 2017-03-03 NOTE — Consult Note (Signed)
[place] Nurse wound consult note Reason for Consult:Bilateral edema/blistering to lower legs.  Chronic nonhealing wound to right lateral lower leg.  Will apply Unnas boots today to manage edema and promote healing.  Wound type:Chronic venous insufficiency Pressure Injury POA: N/a Measurement:Right lateral leg:  3.3 cm x 3 cm x 0.2 cm new epithelium noted Wound ZOX:WRUE and moist Drainage (amount, consistency, odor) none noted Periwound:Erytehema, edema, blistering.  Dressing procedure/placement/frequency:Cleanse bilateral lower legs with soap and water and pat dry.  Zinc layer from below toes to below knee, secure with self adherent Coban.  Change twice weekly.  [place] team will follow.   Domenic Moras RN BSN [place] Pager [phone]

## 2017-03-03 NOTE — Progress Notes (Addendum)
[facility] [place] NAME: [name]    MR#:  [id number]  DATE OF BIRTH:  1971-05-01  SUBJECTIVE:   Patient this morning still with a bit confusion. She knows where she is she knows that time and her name. Some questions however are difficult for her to answer such as why she presented to the ER.  REVIEW OF SYSTEMS:    Review of Systems  Constitutional: Negative for fever, chills weight loss HENT: Negative for ear pain, nosebleeds, congestion, facial swelling, rhinorrhea, neck pain, neck stiffness and ear discharge.   Respiratory: Negative for cough, shortness of breath, wheezing  Cardiovascular: Negative for chest pain, palpitations  She has chronic lower extremity edema   Gastrointestinal: Negative for heartburn, abdominal pain, vomiting, diarrhea or consitpation Genitourinary: Negative for dysuria, urgency, frequency, hematuria Musculoskeletal: Negative for back pain or joint pain Neurological: Negative for dizziness, seizures, syncope, focal weakness,  numbness and headaches. She is in confusion Hematological: Does not bruise/bleed easily.  Psychiatric/Behavioral: Negative for hallucinations, confusion, dysphoric mood She is schizophrenia    Tolerating Diet: npo      DRUG ALLERGIES:   Allergies  Allergen Reactions  . Peanut Oil Other (See Comments)    Face turns red  . Risperidone And Related Cough    VITALS:  Blood pressure (!) 86/39, pulse 97, temperature 98.3 F (36.8 C), resp. rate 18, height 5\' 6"  (1.676 m), weight 118.3 kg (260 lb 14.4 oz), SpO2 100 %.  PHYSICAL EXAMINATION:  Constitutional: Appears well-developed and well-nourished. No distress. HENT: Normocephalic. Marland Kitchen Oropharynx is clear and moist.  Eyes: Conjunctivae and EOM are normal. PERRLA, no scleral icterus.  Neck: Normal ROM. Neck supple. No JVD. No tracheal deviation. CVS: RRR, S1/S2 +, no murmurs, no gallops, no carotid bruit.  Pulmonary: Effort  and breath sounds normal, no stridor, rhonchi, wheezes, rales.  Abdominal: Soft. BS +,  no distension, tenderness, rebound or guarding.  Musculoskeletal: Normal range of motion. 3+ pitting edema with weeping, very minimal erythema left leg with chronic venous stasis changes and no tenderness.  Neuro: Alert. CN 2-12 grossly intact. No focal deficits. Skin: Skin is warm and dry. No rash noted. Psychiatric: Flat affect at times confused    LABORATORY PANEL:   CBC  Recent Labs Lab 03/03/17 0454  WBC 9.3  HGB 9.2*  HCT 27.2*  PLT 472*   ------------------------------------------------------------------------------------------------------------------  Chemistries   Recent Labs Lab 03/02/17 1600 03/03/17 0454  NA 130* 136  K 5.5* 3.6  CL 103 112*  CO2 18* 21*  GLUCOSE 75 72  BUN 8 8  CREATININE 1.36* 1.14*  CALCIUM 7.0* 6.7*  AST 67*  --   ALT 50  --   ALKPHOS 136*  --   BILITOT 0.5  --    ------------------------------------------------------------------------------------------------------------------  Cardiac Enzymes  Recent Labs Lab 03/02/17 1600  TROPONINI <0.03   ------------------------------------------------------------------------------------------------------------------  RADIOLOGY:  Ct Head Wo Contrast  Result Date: 03/02/2017 CLINICAL DATA:  Altered mental status, on Levaquin for urinary tract infection. History of schizophrenia, traumatic brain injury, diabetes. EXAM: CT HEAD WITHOUT CONTRAST TECHNIQUE: Contiguous axial images were obtained from the base of the skull through the vertex without intravenous contrast. COMPARISON:  CT HEAD September 27, 2016 FINDINGS: BRAIN: No intraparenchymal hemorrhage, mass effect nor midline shift. The ventricles and sulci are normal. No acute large vascular territory infarcts. No abnormal extra-axial fluid collections. Basal cisterns are patent. Symmetric dural calcifications. VASCULAR: Unremarkable. SKULL/SOFT TISSUES:  No skull fracture. No  significant soft tissue swelling. Frontal scalp scarring and scattered tricholemmal cysts. ORBITS/SINUSES: The included ocular globes and orbital contents are normal.The mastoid aircells and included paranasal sinuses are well-aerated. OTHER: None. IMPRESSION: Negative CT HEAD. Electronically Signed   By: Elon Alas M.D.   On: 03/02/2017 17:34   Dg Chest Portable 1 View  Result Date: 03/02/2017 CLINICAL DATA:  Altered mental status . EXAM: PORTABLE CHEST 1 VIEW COMPARISON:  02/14/2017. FINDINGS: Heart size normal. Low lung volumes. No focal infiltrate. No pleural effusion or pneumothorax. No acute bony abnormality . IMPRESSION: Low lung volumes.  No acute cardiopulmonary disease. Electronically Signed   By: Marcello Moores  Register   On: 03/02/2017 16:32     ASSESSMENT AND PLAN:    46 year old female discharged in April due to sepsis from urinary tract infection presents with acute metabolic encephalopathy.  1. Acute metabolic encephalopathy in the setting of hyper ammonia Ammonia level is trending down Continue lactulose Follow-up on right upper quadrant abdominal ultrasound. Follow up on ucx empiric rocephin for now   2. Hypotension:Patient had hypotension during last admission and was started on ranitidine. Manual BP this a.m. Check cortisol level Cosyntropin test Continue midodrine Consider adding Florinef  3. Chronic weeping edema bilateral lower extremities due to stases and immobility Wound care consult May need Unna boots She had lower external he Dopplers during last hospital stay which were negative for DVT She had echocardiogram March 2018 which shows normal ejection fraction and no diastolic dysfunction  4. Hyponatremia: This is improved  5. Hyperkalemia: This is improved and is normalized. 6. Schizophrenia: Continue Celexa      Management plans discussed with the patient and she is in agreement.  CODE STATUS: full  TOTAL TIME TAKING CARE  OF THIS PATIENT: 30 minutes.   CSW patient from SNF  POSSIBLE D/C 1-2 days, DEPENDING ON CLINICAL CONDITION.   Mila Pair M.D on 03/03/2017 at 7:43 AM  Between 7am to 6pm - Pager - [phone] After 6pm go to [email or web address] - password [email or web address] [facility]  Office  [phone]  CC: Primary care physician; Lorelee Market, MD  Note: This dictation was prepared with Dragon dictation along with smaller phrase technology. Any transcriptional errors that result from this process are unintentional.

## 2017-03-03 NOTE — Consult Note (Signed)
Name: [name] MRN: [id number] DOB: Sep 23, 1971    ADMISSION DATE:  03/02/2017 CONSULTATION DATE: 03/02/2017  REFERRING MD :  Dr. Benjie Karvonen   CHIEF COMPLAINT: Altered Mental Status   BRIEF PATIENT DESCRIPTION:  46 yo female admitted 05/3 with altered mental status from [facility]   SIGNIFICANT EVENTS  05/3-Pt admitted to medsurg unit  05/4-Pt transferred to ICU due to hypotension   STUDIES:  CT Head 05/3>>negative   HISTORY OF PRESENT ILLNESS:   This is a 46 yo female with multiple comorbidities who was recently discharged (02/15/17) to rehab after hospitalization for hyponatremia, weakness and urosepsis. She was re-admitted on 03/02/17 with AMS and found to have an elevated ammonia level, and hyponatremia. Today she became hypotensive requiring pressors hence she was transferred to the ICU and PCCM was consulted for further management. Her hemoglobin has dropped from 9.7 to 7.7. She is confused. Agrees to pain but unable to further characterize the pain.   PAST MEDICAL HISTORY :   has a past medical history of Abnormal mammogram, unspecified (2013); Breast screening, unspecified (2013); Broken leg; Cellulitis; Crohn's disease ([place]) (2013); Depression; Diabetes mellitus without complication ([place]); Early menopause; Edema; GERD (gastroesophageal reflux disease); Hiatal hernia (2013); Hyperlipidemia; Hypertension; Hypothyroidism; Mass, eye (1990); Obesity, unspecified (2013); Other sign and symptom in breast (2013); Regional enteritis [facility]); Rib fracture; Schizophrenia ([place]); TBI (traumatic brain injury) ([place]); Thyroid disease; and Vitamin D deficiency.  has a past surgical history that includes Tumor excision (Right); Eye surgery (Left, 2013); Colonoscopy with propofol (N/A, 02/16/2016); Colonoscopy with propofol (N/A, 02/22/2016); Cardiac catheterization (N/A, 10/13/2016); Colonoscopy with propofol (N/A, 10/14/2016); and Cardiac catheterization (N/A, 10/18/2016). Prior to Admission  medications   Medication Sig Start Date End Date Taking? Authorizing Provider  acidophilus (RISAQUAD) CAPS capsule Take 2 capsules by mouth 3 (three) times daily.   Yes Historical Provider, MD  albuterol (PROVENTIL HFA;VENTOLIN HFA) 108 (90 Base) MCG/ACT inhaler Inhale into the lungs every 6 (six) hours as needed for wheezing or shortness of breath.   Yes Historical Provider, MD  Amino Acids-Protein Hydrolys (FEEDING SUPPLEMENT, PRO-STAT SUGAR FREE 64,) LIQD Take 30 mLs by mouth 3 (three) times daily with meals.   Yes Historical Provider, MD  bisacodyl (DULCOLAX) 5 MG EC tablet Take 5 mg by mouth daily as needed for moderate constipation.   Yes Historical Provider, MD  calcium carbonate (OSCAL) 1500 (600 Ca) MG TABS tablet Take 1,500 mg by mouth 2 (two) times daily with a meal.   Yes Historical Provider, MD  Cholecalciferol (VITAMIN D3) 5000 units CAPS Take 5,000 Units by mouth daily.   Yes Historical Provider, MD  cholestyramine light (PREVALITE) 4 g packet Take 4 g by mouth 2 (two) times daily.   Yes Historical Provider, MD  citalopram (CELEXA) 40 MG tablet Take 40 mg by mouth daily.   Yes Historical Provider, MD  diphenhydrAMINE (BENADRYL) 25 MG tablet Take 50 mg by mouth at bedtime as needed.   Yes Historical Provider, MD  diphenoxylate-atropine (LOMOTIL) 2.5-0.025 MG tablet Take 2 tablets by mouth every 2 (two) hours as needed for diarrhea or loose stools.   Yes Historical Provider, MD  gemfibrozil (LOPID) 600 MG tablet Take 600 mg by mouth 2 (two) times daily before a meal.   Yes Historical Provider, MD  hyoscyamine (LEVSIN, ANASPAZ) 0.125 MG tablet Take 0.125 mg by mouth every 6 (six) hours as needed.   Yes Historical Provider, MD  levofloxacin (LEVAQUIN) 500 MG tablet Take 500 mg by mouth daily. 03/01/17  03/05/17 Yes Historical Provider, MD  LORazepam (ATIVAN) 1 MG tablet Take 1 mg by mouth daily.   Yes Historical Provider, MD  magnesium oxide (MAGNESIUM-OXIDE) 400 (241.3 Mg) MG tablet Take 400  mg by mouth 2 (two) times daily.   Yes Historical Provider, MD  mesalamine (APRISO) 0.375 g 24 hr capsule Take 1,500 mg by mouth daily.   Yes Historical Provider, MD  midodrine (PROAMATINE) 10 MG tablet Take 10 mg by mouth 4 (four) times daily.   Yes Historical Provider, MD  Multiple Vitamin (MULTIVITAMIN WITH MINERALS) TABS tablet Take 1 tablet by mouth daily.   Yes Historical Provider, MD  omega-3 acid ethyl esters (LOVAZA) 1 g capsule Take 1 g by mouth daily.   Yes Historical Provider, MD  pantoprazole (PROTONIX) 40 MG tablet Take 40 mg by mouth daily.   Yes Historical Provider, MD  potassium chloride SA (K-DUR,KLOR-CON) 20 MEQ tablet Take 20 mEq by mouth 2 (two) times daily.   Yes Historical Provider, MD  pseudoephedrine-guaifenesin (MUCINEX D) 60-600 MG 12 hr tablet Take 1 tablet by mouth every 12 (twelve) hours as needed for congestion.   Yes Historical Provider, MD  simvastatin (ZOCOR) 20 MG tablet Take 20 mg by mouth daily.   Yes Historical Provider, MD  sucralfate (CARAFATE) 1 g tablet Take 1 g by mouth 4 (four) times daily -  with meals and at bedtime.   Yes Historical Provider, MD  vitamin B-12 (CYANOCOBALAMIN) 1000 MCG tablet Take 1,000 mcg by mouth daily.   Yes Historical Provider, MD  vitamin C (ASCORBIC ACID) 250 MG tablet Take 500 mg by mouth daily.   Yes Historical Provider, MD   Allergies  Allergen Reactions  . Peanut Oil Other (See Comments)    Face turns red  . Risperidone And Related Cough    FAMILY HISTORY:  family history includes Cancer in her paternal aunt; Cancer (age of onset: 61) in her mother. SOCIAL HISTORY:  reports that she has never smoked. She has never used smokeless tobacco. She reports that she does not drink alcohol or use drugs.  REVIEW OF SYSTEMS:   Limited due to confusion Constitutional: denies fever but reports feeling cold HEENT: Denies difficulty swallowing but reports dry mouth Respiratory: denies dyspnea and cough CV: Denies chest pain,  palpitations but reports edema Abdomen: reports abdominal cramping and diarrhea GU: Reports difficulty urinating Neuro: denies headache but agrees to weakness Skin: reports lower extremity ulceration  SUBJECTIVE:   VITAL SIGNS: Temp:  [97.5 F (36.4 C)-98.3 F (36.8 C)] 97.6 F (36.4 C) (05/04 1655) Pulse Rate:  [93-111] 93 (05/04 1900) Resp:  [15-22] 18 (05/04 1900) BP: (64-99)/(25-66) 84/57 (05/04 1900) SpO2:  [96 %-100 %] 100 % (05/04 1900) Weight:  [118.3 kg (260 lb 14.4 oz)] 118.3 kg (260 lb 14.4 oz) (05/03 2113)  PHYSICAL EXAMINATION: General:  Pale and chronically ill looking Neuro:  Alert to person and place only, speech is normal HEENT:  PERRLA, oral mucosa dry, trachea midline Cardiovascular: RRR, S1/S2, no MRG, anasarca, +1 pulses, lower extremity edema with ace wraps in place Lungs: Normal WOB, CTAB, diminished in the bases Abdomen: obese, hyperactive bowel sounds Musculoskeletal:  +rom, no joint swelling or effusion Skin:  Multiple excoriated areas in BLLE and UE   Recent Labs Lab 03/02/17 1600 03/03/17 0454  NA 130* 136  K 5.5* 3.6  CL 103 112*  CO2 18* 21*  BUN 8 8  CREATININE 1.36* 1.14*  GLUCOSE 75 72    Recent Labs Lab  03/02/17 1600 03/03/17 0454  HGB 9.7* 9.2*  HCT 30.0* 27.2*  WBC 12.9* 9.3  PLT 521* 472*   Ct Head Wo Contrast  Result Date: 03/02/2017 CLINICAL DATA:  Altered mental status, on Levaquin for urinary tract infection. History of schizophrenia, traumatic brain injury, diabetes. EXAM: CT HEAD WITHOUT CONTRAST TECHNIQUE: Contiguous axial images were obtained from the base of the skull through the vertex without intravenous contrast. COMPARISON:  CT HEAD September 27, 2016 FINDINGS: BRAIN: No intraparenchymal hemorrhage, mass effect nor midline shift. The ventricles and sulci are normal. No acute large vascular territory infarcts. No abnormal extra-axial fluid collections. Basal cisterns are patent. Symmetric dural calcifications.  VASCULAR: Unremarkable. SKULL/SOFT TISSUES: No skull fracture. No significant soft tissue swelling. Frontal scalp scarring and scattered tricholemmal cysts. ORBITS/SINUSES: The included ocular globes and orbital contents are normal.The mastoid aircells and included paranasal sinuses are well-aerated. OTHER: None. IMPRESSION: Negative CT HEAD. Electronically Signed   By: Elon Alas M.D.   On: 03/02/2017 17:34   Dg Chest Portable 1 View  Result Date: 03/02/2017 CLINICAL DATA:  Altered mental status . EXAM: PORTABLE CHEST 1 VIEW COMPARISON:  02/14/2017. FINDINGS: Heart size normal. Low lung volumes. No focal infiltrate. No pleural effusion or pneumothorax. No acute bony abnormality . IMPRESSION: Low lung volumes.  No acute cardiopulmonary disease. Electronically Signed   By: Marcello Moores  Register   On: 03/02/2017 16:32   US Abdomen Limited Ruq  Result Date: 03/03/2017 CLINICAL DATA:  Hyperammonemia, history Crohn's disease, diabetes mellitus, hypertension EXAM: US ABDOMEN LIMITED - RIGHT UPPER QUADRANT COMPARISON:  CT abdomen and pelvis 03/14/2012 FINDINGS: Gallbladder: Normally distended without stones or wall thickening. No pericholecystic fluid or sonographic Murphy sign. Common bile duct: Diameter: 4 mm diameter, normal Liver: Degradation of image quality secondary to increased hepatic echogenicity and body habitus. Echogenic hepatic parenchyma likely representing fatty infiltration though this can be seen with cirrhosis and certain infiltrative disorders. No focal hepatic mass or nodularity. Hepatopetal portal venous flow. No RIGHT upper quadrant free fluid. IMPRESSION: Probable fatty infiltration of liver as above. Otherwise negative exam. Electronically Signed   By: Lavonia Dana M.D.   On: 03/03/2017 08:37    ASSESSMENT  Septic shock Severe hypokalemia Hyperammonemia Anemia-unclear etiology Severe hypoalbuminemia Hypothyroidism ?Adrenal insufficiency Nausea/vomiting UTI  PLAN -Central  venous catheter placed -Norepinephrine infusion-titrate to MAP of 65 and above -Transfuse 1 unit of PRBCs; give lasix 20mg  iv x 1 prior to transfusion -albumin 25 gram IV X1 -Magnesium and potassium supplemented -Trend CBC and chemistry -ACTH stimulation to r/o adrenal insufficiency -Hemodynamics per ICU -Clear liquid diet -Zofran  And reglan q6h prn  -PCT 0.55, Continue ceftriaxone -F/U blood and urine cultures  Plan of care discussed with Dr. Jonathon Jordan. [facility] ANP-BC Pulmonary and [place] Pager [phone] or [phone]  03/03/2017, 7:59 PM   PCCM ATTENDING ATTESTATION:  I have evaluated patient with the APP [place], reviewed database in its entirety and discussed care plan in detail. In addition, this patient was discussed on multidisciplinary rounds. I also discussed with Dr Benjie Karvonen. Main problem is hypotension. However, she has minimal symptoms of overt shock. Her mentation is intact. She has continued to make urine with no significant renal injury. Her exam shows generalized volume overload and her albumin is less than 1.0. I suspect there is relative intravascular hypovolemia. She might also have a component of adrenal insufficiency. This does not seem to be septic shock. There is no overt source of infection that I  can identify.   Important exam findings: NAD HEENT WNL JVP cannot be assessed Chest clear Distant heart sounds, no murmurs Obese, soft, bowel sounds present Severe lower extremity edema with chronic stasis changes Profoundly and diffusely weak without focal neuro deficits  Major problems addressed by PCCM team: Hypotension, etiology unclear Severe protein deficiency (albumin less than 1.0) Anasarca  PLAN/REC: Scheduled albumin 4 doses Cortisol level ordered for this morning Empiric hydrocortisone Wean norepinephrine to off for mean arterial pressure greater than 60 mmHg Furosemide 2 doses today Monitor BMET  intermittently Monitor I/Os Correct electrolytes as indicated   Merton Border, MD PCCM service Mobile [phone] Pager [phone] 03/04/2017 2:18 PM

## 2017-03-03 NOTE — Progress Notes (Signed)
Spoke with bedside nurse Maddie regarding PICC ordered for patient.  At this time patient is not a candidate for bedside PICC insertion due to decreased BP (80's) and patient is in process of being transferred to ICU.  PICC insertion requires BP to be stable and above 90's and patient must be stable due to length of time required to perform procedure (1-2 hours).  Maddie, RN stated that she would call MD for further plan.  Offer to start another PIV but was declined at present.  Carolee Rota, RN VAST

## 2017-03-03 NOTE — Procedures (Signed)
Arterial Catheter Insertion Procedure Note [name] [id number] [birth year]  Procedure: Insertion of Arterial Catheter  Indications: Blood pressure monitoring and Frequent blood sampling  Procedure Details Consent: Risks of procedure as well as the alternatives and risks of each were explained to the (patient/caregiver).  Consent for procedure obtained. Time Out: Verified patient identification, verified procedure, site/side was marked, verified correct patient position, special equipment/implants available, medications/allergies/relevent history reviewed, required imaging and test results available.  Performed  Maximum sterile technique was used including antiseptics, cap, gloves, gown, hand hygiene, mask and sheet. Skin prep: Chlorhexidine; local anesthetic administered. Three  Unsuccessful attempts to thread guidewire into left femoral, right femoral and right radial arteries using the Seldinger technique. Procedure aborted.   Evaluation Procedure aborted after three attempts Pressure applied to femoral sites; no bleeding or hematoma.  [name] [facility] ANP-BC Pulmonary and Critical Care Medicine [facility] Pager [phone] or [phone]  [name] 03/03/2017   Merton Border, MD PCCM service Mobile [phone] Pager [phone] 03/04/2017 2:13 PM

## 2017-03-03 NOTE — Progress Notes (Signed)
RN spoke with Dr. Alva Garnet and made MD aware that patient has blood pressures with MAP of 65 or better and systolically in the 28'F and mentating.  MD stated to not start vasopressors at this time since patient is mentating and to monitor.

## 2017-03-03 NOTE — Progress Notes (Signed)
Patient assessed for PIV with ultrasound, attempt x1 unsuccessful.  Assessed both arms for PICC, patient's vein are extremely small.  Arteries are unable to be seen.  Patient BP per cuff 68/36 on monitor.  Bedside nurse updated.  Carolee Rota, RN VAST

## 2017-03-03 NOTE — Progress Notes (Signed)
Patient presented from E.D with low blood pressure of 73/29 mmhg. Patient reported that she usually is running low BP. Writer notified MD on call, ordered for NS 1L IV bolus, which was given  At 2130. Rechecked BP at 2236, revealed a BP of 68/38. Patient was responsive, no signs of distress. MD on call ordered for another 1L NS bolus which was administered at 2258. Rechecked BP after the 2nd bolus at 0016, revelaed a BP reading of 77/45. Notified MD again about the latest BP result. MD ordered for a maintenance fluid of NS 125 ml/hr. Patient resting well. No signs of distress. Will continue to monitor.

## 2017-03-03 NOTE — NC FL2 (Signed)
[place] LEVEL OF CARE SCREENING TOOL     IDENTIFICATION  Patient Name: [name] Birthdate: 1971-10-04 Sex: female Admission Date (Current Location): 03/02/2017  [place] and [place] Number:  Engineering geologist and Address:  [facility], [address] [place], [place] [address]      Provider Number: [id number]  Attending Physician Name and Address:  Bettey Costa, MD  Relative Name and Phone Number:       Current Level of Care: Hospital Recommended Level of Care: [place] Prior Approval Number:    Date Approved/Denied:   PASRR Number: [id number]  Discharge Plan: SNF    Current Diagnoses: Patient Active Problem List   Diagnosis Date Noted  . Hyperammonemia ([place]) 03/02/2017  . Sepsis secondary to UTI ([place]) 02/13/2017  . Hypotension 01/23/2017  . Scalp laceration   . Pressure injury of skin 10/02/2016  . 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 Acute renal failure (ARF) ([place])   . Respiratory failure ([place]) 09/27/2016  . Other sign and symptom in breast     Orientation RESPIRATION BLADDER Height & Weight     Self  Normal Incontinent Weight: 260 lb 14.4 oz (118.3 kg) Height:  5\' 6"  (167.6 cm)  BEHAVIORAL SYMPTOMS/MOOD NEUROLOGICAL BOWEL NUTRITION STATUS      Incontinent Diet  AMBULATORY STATUS COMMUNICATION OF NEEDS Skin   Extensive Assist Verbally Normal                       Personal Care Assistance Level of Assistance  Bathing, Feeding, Dressing Bathing Assistance: Limited assistance Feeding assistance: Limited assistance Dressing Assistance: Limited assistance     Functional Limitations Info  Sight, Hearing, Speech Sight Info: Adequate Hearing Info: Adequate Speech Info: Adequate    SPECIAL CARE FACTORS FREQUENCY  PT (By licensed PT), OT (By licensed OT)     PT Frequency: 5 OT Frequency: 5            Contractures Contractures Info: Not present    Additional  Factors Info  Code Status, Allergies, Psychotropic Code Status Info: Full Code Allergies Info: Peanut Oil, Risperidone And Related Psychotropic Info: Medications:  Celexa, Ativan         Current Medications (03/03/2017):  This is the current hospital active medication list Current Facility-Administered Medications  Medication Dose Route Frequency Provider Last Rate Last Dose  . 0.9 %  sodium chloride infusion   Intravenous Continuous Bettey Costa, MD 125 mL/hr at 03/03/17 0618    . acetaminophen (TYLENOL) tablet 650 mg  650 mg Oral Q6H PRN Loletha Grayer, MD       Or  . acetaminophen (TYLENOL) suppository 650 mg  650 mg Rectal Q6H PRN Loletha Grayer, MD      . acidophilus (RISAQUAD) capsule 2 capsule  2 capsule Oral TID Loletha Grayer, MD   2 capsule at 03/03/17 0936  . albuterol (PROVENTIL) (2.5 MG/3ML) 0.083% nebulizer solution 2.5 mg  2.5 mg Inhalation Q6H PRN Loletha Grayer, MD      . cefTRIAXone (ROCEPHIN) 1 g in dextrose 5 % 50 mL IVPB  1 g Intravenous Q24H Lenis Noon, [facility]      . citalopram (CELEXA) tablet 40 mg  40 mg Oral Daily Loletha Grayer, MD   40 mg at 03/03/17 0936  . [START ON 03/04/2017] cosyntropin (CORTROSYN) injection 0.25 mg  0.25 mg Intravenous Once Sital Mody, MD      . enoxaparin (LOVENOX) injection 40 mg  40  mg Subcutaneous Q12H Loletha Grayer, MD   40 mg at 03/03/17 0935  . fludrocortisone (FLORINEF) tablet 0.1 mg  0.1 mg Oral Daily Sital Mody, MD   0.1 mg at 03/03/17 0936  . hyoscyamine (LEVSIN SL) SL tablet 0.125 mg  0.125 mg Sublingual Q6H PRN Loletha Grayer, MD      . lactulose (CHRONULAC) 10 GM/15ML solution 30 g  30 g Oral QID Loletha Grayer, MD   30 g at 03/03/17 [phone]  . LORazepam (ATIVAN) tablet 0.5 mg  0.5 mg Oral Daily Loletha Grayer, MD      . magnesium oxide (MAG-OX) tablet 400 mg  400 mg Oral BID Loletha Grayer, MD   400 mg at 03/03/17 0936  . mesalamine (LIALDA) EC tablet 1.2 g  1.2 g Oral Daily Loletha Grayer, MD      . midodrine (PROAMATINE)  tablet 10 mg  10 mg Oral QID Loletha Grayer, MD   10 mg at 03/03/17 0936  . ondansetron (ZOFRAN) tablet 4 mg  4 mg Oral Q6H PRN Loletha Grayer, MD       Or  . ondansetron [facility]) injection 4 mg  4 mg Intravenous Q6H PRN Loletha Grayer, MD   4 mg at 03/02/17 2146  . pantoprazole (PROTONIX) EC tablet 40 mg  40 mg Oral Daily Loletha Grayer, MD   40 mg at 03/03/17 3794     Discharge Medications: Please see discharge summary for a list of discharge medications.  Relevant Imaging Results:  Relevant Lab Results:   Additional Information SSN:  [id number]  Darden Dates, LCSW

## 2017-03-04 LAB — BASIC METABOLIC PANEL
ANION GAP: 5 (ref 5–15)
Anion gap: 7 (ref 5–15)
BUN: 6 mg/dL (ref 6–20)
CALCIUM: 6.9 mg/dL — AB (ref 8.9–10.3)
CALCIUM: 7 mg/dL — AB (ref 8.9–10.3)
CHLORIDE: 111 mmol/L (ref 101–111)
CO2: 21 mmol/L — AB (ref 22–32)
CO2: 23 mmol/L (ref 22–32)
CREATININE: 1.09 mg/dL — AB (ref 0.44–1.00)
Chloride: 113 mmol/L — ABNORMAL HIGH (ref 101–111)
Creatinine, Ser: 1.04 mg/dL — ABNORMAL HIGH (ref 0.44–1.00)
GFR calc non Af Amer: >60 mL/min (ref >60)
GFR calc non Af Amer: >60 mL/min (ref >60)
Glucose, Bld: 124 mg/dL — ABNORMAL HIGH (ref 65–99)
Glucose, Bld: 147 mg/dL — ABNORMAL HIGH (ref 65–99)
Potassium: 2.8 mmol/L — ABNORMAL LOW (ref 3.5–5.1)
Potassium: 2.8 mmol/L — ABNORMAL LOW (ref 3.5–5.1)
SODIUM: 141 mmol/L (ref 135–145)
Sodium: 139 mmol/L (ref 135–145)

## 2017-03-04 LAB — GLUCOSE, CAPILLARY: GLUCOSE-CAPILLARY: 92 mg/dL (ref 65–99)

## 2017-03-04 LAB — CBC
HEMATOCRIT: 28.4 % — AB (ref 35.0–47.0)
Hemoglobin: 9.1 g/dL — ABNORMAL LOW (ref 12.0–16.0)
MCH: 28.5 pg (ref 26.0–34.0)
MCHC: 32 g/dL (ref 32.0–36.0)
MCV: 89.1 fL (ref 80.0–100.0)
PLATELETS: 462 10*3/uL — AB (ref 150–440)
RBC: 3.18 MIL/uL — ABNORMAL LOW (ref 3.80–5.20)
RDW: 17 % — AB (ref 11.5–14.5)
WBC: 12.7 10*3/uL — AB (ref 3.6–11.0)

## 2017-03-04 LAB — PHOSPHORUS: PHOSPHORUS: 3.8 mg/dL (ref 2.5–4.6)

## 2017-03-04 LAB — ACTH STIMULATION, 3 TIME POINTS
CORTISOL 60 MIN: 19.5 ug/dL
Cortisol, 30 Min: 16.7 ug/dL
Cortisol, Base: 9.1 ug/dL

## 2017-03-04 LAB — ALBUMIN: ALBUMIN: 1.7 g/dL — AB (ref 3.5–5.0)

## 2017-03-04 LAB — PROCALCITONIN: PROCALCITONIN: 0.43 ng/mL

## 2017-03-04 LAB — HEPATITIS B SURFACE ANTIGEN: Hepatitis B Surface Ag: NEGATIVE

## 2017-03-04 LAB — HEPATITIS B SURFACE ANTIBODY, QUANTITATIVE

## 2017-03-04 LAB — CORTISOL: CORTISOL PLASMA: 14.8 ug/dL

## 2017-03-04 LAB — MAGNESIUM: MAGNESIUM: 2 mg/dL (ref 1.7–2.4)

## 2017-03-04 LAB — HEPATITIS C ANTIBODY: HCV Ab: 0.1 s/co ratio (ref 0.0–0.9)

## 2017-03-04 LAB — PREPARE RBC (CROSSMATCH)

## 2017-03-04 LAB — AMMONIA: Ammonia: 50 umol/L — ABNORMAL HIGH (ref 9–35)

## 2017-03-04 LAB — HEPATITIS B CORE ANTIBODY, TOTAL: Hep B Core Total Ab: NEGATIVE

## 2017-03-04 LAB — T3, FREE: T3 FREE: 0.9 pg/mL — AB (ref 2.0–4.4)

## 2017-03-04 MED ORDER — HYDROCORTISONE NA SUCCINATE PF 100 MG IJ SOLR
50.0000 mg | Freq: Three times a day (TID) | INTRAMUSCULAR | Status: DC
  Administered 2017-03-04 – 2017-03-05 (×4): 50 mg via INTRAVENOUS
  Filled 2017-03-04 (×4): qty 2

## 2017-03-04 MED ORDER — POTASSIUM CHLORIDE 10 MEQ/50ML IV SOLN
10.0000 meq | INTRAVENOUS | Status: AC
  Administered 2017-03-04 (×6): 10 meq via INTRAVENOUS
  Filled 2017-03-04 (×6): qty 50

## 2017-03-04 MED ORDER — LEVOTHYROXINE SODIUM 100 MCG IV SOLR
25.0000 ug | Freq: Every day | INTRAVENOUS | Status: DC
  Administered 2017-03-04: 25 ug via INTRAVENOUS
  Filled 2017-03-04: qty 5

## 2017-03-04 MED ORDER — FUROSEMIDE 10 MG/ML IJ SOLN
40.0000 mg | Freq: Four times a day (QID) | INTRAMUSCULAR | Status: AC
Start: 2017-03-04 — End: 2017-03-04
  Administered 2017-03-04 (×2): 40 mg via INTRAVENOUS
  Filled 2017-03-04 (×2): qty 4

## 2017-03-04 MED ORDER — POTASSIUM CHLORIDE 10 MEQ/50ML IV SOLN
10.0000 meq | INTRAVENOUS | Status: AC
  Administered 2017-03-04 (×4): 10 meq via INTRAVENOUS
  Filled 2017-03-04 (×4): qty 50

## 2017-03-04 MED ORDER — ALBUMIN HUMAN 25 % IV SOLN
12.5000 g | Freq: Four times a day (QID) | INTRAVENOUS | Status: DC
  Administered 2017-03-04 – 2017-03-06 (×7): 12.5 g via INTRAVENOUS
  Filled 2017-03-04 (×9): qty 50

## 2017-03-04 MED ORDER — POTASSIUM CHLORIDE CRYS ER 20 MEQ PO TBCR
40.0000 meq | EXTENDED_RELEASE_TABLET | Freq: Once | ORAL | Status: AC
  Administered 2017-03-04: 40 meq via ORAL
  Filled 2017-03-04: qty 2

## 2017-03-04 MED ORDER — ALBUMIN HUMAN 5 % IV SOLN
12.5000 g | Freq: Four times a day (QID) | INTRAVENOUS | Status: DC
  Administered 2017-03-04 (×2): 12.5 g via INTRAVENOUS
  Filled 2017-03-04 (×4): qty 250

## 2017-03-04 NOTE — Progress Notes (Signed)
   03/03/17 2359  Vitals  Temp (!) 96.3 F (35.7 C)  Temp Source Axillary  BP (!) 97/55  MAP (mmHg) 68  Pulse Rate 97  ECG Heart Rate 97  Resp 16  Oxygen Therapy  SpO2 100 %    Warming blanket applied

## 2017-03-04 NOTE — Progress Notes (Signed)
[facility] Progress Note Patient Name: [name] DOB: 1971/09/12 MRN: [id number]   Date of Service  03/04/2017  HPI/Events of Note  K+ = 2.8 and Creatinine = 1.09.   eICU Interventions  Will replace K+.      Intervention Category Major Interventions: Electrolyte abnormality - evaluation and management  Sommer,Steven Eugene 03/04/2017, 6:12 PM

## 2017-03-04 NOTE — Progress Notes (Signed)
   03/04/17 0500  Hot/Cold Interventions  Hot/Cold Interventions Warming blanket  Warming Blanket Applied

## 2017-03-04 NOTE — Progress Notes (Signed)
Awake and alert all day. Denies pain.  C/o being thirsty. Eating cereal and milk at all meals. NSR on monitor. 3+ edema of LE'S wrapped. 2+ generalized edema. Weaning off levophed as able. Currently at 6 mcg levophed (was at 27 mcg this am). Receiving albumin and midodrine. Hydrocortisone IV started after cortisol drawn. 3,225 ml  urine output this shift. Lasix 40mg  x2. Had 1873ml liquid light green stool via flexiseal. Flexiseal also with some leakage. Receiving lactulose with improving ammonia levels. . Has required K+ replacement all day. Zofran x2 for nausea and vomiting. Pt spoke with Barbaraann Share on phone today.

## 2017-03-04 NOTE — Plan of Care (Signed)
Currently receiving 1 units of RBC       Intake/Output Summary (Last 24 hours) at 03/04/17 0358 Last data filed at 03/04/17 0346  Gross per 24 hour  Intake          2163.52 ml  Output             3503 ml  Net         -1339.48 ml

## 2017-03-04 NOTE — Progress Notes (Signed)
   03/03/17 2200  Vitals  Temp (!) 96.5 F (35.8 C)  Temp Source Axillary  BP 93/63  MAP (mmHg) 73  Pulse Rate 96  ECG Heart Rate 95  Resp 18  Oxygen Therapy  SpO2 100 %     Warm blanket placed on pt

## 2017-03-04 NOTE — Progress Notes (Addendum)
[place] at [place] NAME: [name]    MR#:  [id number]  DATE OF BIRTH:  02-02-1971  SUBJECTIVE:   Patient sent to ICU yesterday due to persistent hypotension. She is currently on pressors which are being weaned. She is on Lasix as well as albumin. Mentation is back to baseline.   REVIEW OF SYSTEMS:    Review of Systems  Constitutional: Negative for fever, chills weight loss She was hypothermic HENT: Negative for ear pain, nosebleeds, congestion, facial swelling, rhinorrhea, neck pain, neck stiffness and ear discharge.   Respiratory: Negative for cough, shortness of breath, wheezing  Cardiovascular: Negative for chest pain, palpitations  She has chronic lower extremity edema   Gastrointestinal: Negative for heartburn, abdominal pain, vomiting, diarrhea or consitpation Genitourinary: Negative for dysuria, urgency, frequency, hematuria Musculoskeletal: Negative for back pain or joint pain Neurological: Negative for dizziness, seizures, syncope, focal weakness,  numbness and headaches. Mental status is improved Hematological: Does not bruise/bleed easily.  Psychiatric/Behavioral: Negative for hallucinations, confusion, dysphoric mood She has diagnosis of schizophrenia    Tolerating Diet:  yes     DRUG ALLERGIES:   Allergies  Allergen Reactions  . Peanut Oil Other (See Comments)    Face turns red  . Risperidone And Related Cough    VITALS:  Blood pressure 102/64, pulse 97, temperature 97.5 F (36.4 C), temperature source Oral, resp. rate 17, height 5\' 6"  (1.676 m), weight 118.3 kg (260 lb 14.4 oz), SpO2 97 %.  PHYSICAL EXAMINATION:  Constitutional: Appears well-developed and well-nourished. No distress. She has bear hugger HENT: Normocephalic. Marland Kitchen Oropharynx is clear and moist.  Eyes: Conjunctivae and EOM are normal. PERRLA, no scleral icterus.  Neck: Normal ROM. Neck supple. No JVD. No tracheal deviation. CVS: RRR,  S1/S2 +, no murmurs, no gallops, no carotid bruit.  Pulmonary: Effort and breath sounds normal, no stridor, rhonchi, wheezes, rales.  Abdominal: Soft. BS +,  no distension, tenderness, rebound or guarding.  Musculoskeletal: Normal range of motion.   Neuro: Alert. CN 2-12 grossly intact. No focal deficits. Skin: Unna boots bilateral  Psychiatric: Flat affect at times confused    LABORATORY PANEL:   CBC  Recent Labs Lab 03/04/17 0456  WBC 12.7*  HGB 9.1*  HCT 28.4*  PLT 462*   ------------------------------------------------------------------------------------------------------------------  Chemistries   Recent Labs Lab 03/03/17 2048 03/04/17 0456  NA 135 139  K 2.7* 2.8*  CL 111 113*  CO2 20* 21*  GLUCOSE 76 124*  BUN 7 6  CREATININE 1.02* 1.04*  CALCIUM 6.7* 6.9*  MG 1.6* 2.0  AST 43*  --   ALT 45  --   ALKPHOS 107  --   BILITOT 0.4  --    ------------------------------------------------------------------------------------------------------------------  Cardiac Enzymes  Recent Labs Lab 03/02/17 1600  TROPONINI <0.03   ------------------------------------------------------------------------------------------------------------------  RADIOLOGY:  Ct Head Wo Contrast  Result Date: 03/02/2017 CLINICAL DATA:  Altered mental status, on Levaquin for urinary tract infection. History of schizophrenia, traumatic brain injury, diabetes. EXAM: CT HEAD WITHOUT CONTRAST TECHNIQUE: Contiguous axial images were obtained from the base of the skull through the vertex without intravenous contrast. COMPARISON:  CT HEAD September 27, 2016 FINDINGS: BRAIN: No intraparenchymal hemorrhage, mass effect nor midline shift. The ventricles and sulci are normal. No acute large vascular territory infarcts. No abnormal extra-axial fluid collections. Basal cisterns are patent. Symmetric dural calcifications. VASCULAR: Unremarkable. SKULL/SOFT TISSUES: No skull fracture. No significant soft  tissue swelling. Frontal scalp scarring and scattered tricholemmal cysts.  ORBITS/SINUSES: The included ocular globes and orbital contents are normal.The mastoid aircells and included paranasal sinuses are well-aerated. OTHER: None. IMPRESSION: Negative CT HEAD. Electronically Signed   By: Elon Alas M.D.   On: 03/02/2017 17:34   Dg Chest Port 1 View  Result Date: 03/03/2017 CLINICAL DATA:  Encounter for central venous catheter placement EXAM: PORTABLE CHEST 1 VIEW COMPARISON:  03/02/2017 FINDINGS: Right IJ catheter tip is at the cavoatrial junction. No pneumothorax identified. Normal heart size. No pleural effusion or edema. No airspace opacities. IMPRESSION: Tip of the right IJ catheter is in the projection of the cavoatrial junction. Electronically Signed   By: Kerby Moors M.D.   On: 03/03/2017 21:00   Dg Chest Portable 1 View  Result Date: 03/02/2017 CLINICAL DATA:  Altered mental status . EXAM: PORTABLE CHEST 1 VIEW COMPARISON:  02/14/2017. FINDINGS: Heart size normal. Low lung volumes. No focal infiltrate. No pleural effusion or pneumothorax. No acute bony abnormality . IMPRESSION: Low lung volumes.  No acute cardiopulmonary disease. Electronically Signed   By: Marcello Moores  Register   On: 03/02/2017 16:32   US Abdomen Limited Ruq  Result Date: 03/03/2017 CLINICAL DATA:  Hyperammonemia, history Crohn's disease, diabetes mellitus, hypertension EXAM: US ABDOMEN LIMITED - RIGHT UPPER QUADRANT COMPARISON:  CT abdomen and pelvis 03/14/2012 FINDINGS: Gallbladder: Normally distended without stones or wall thickening. No pericholecystic fluid or sonographic Murphy sign. Common bile duct: Diameter: 4 mm diameter, normal Liver: Degradation of image quality secondary to increased hepatic echogenicity and body habitus. Echogenic hepatic parenchyma likely representing fatty infiltration though this can be seen with cirrhosis and certain infiltrative disorders. No focal hepatic mass or nodularity. Hepatopetal  portal venous flow. No RIGHT upper quadrant free fluid. IMPRESSION: Probable fatty infiltration of liver as above. Otherwise negative exam. Electronically Signed   By: Lavonia Dana M.D.   On: 03/03/2017 08:37     ASSESSMENT AND PLAN:    46 year old female discharged in April due to sepsis from urinary tract infection presents with acute metabolic encephalopathy And found to have sepsis  1.Hypotension, without septic shock: Unclear etiology Due to intravascular hypovolemia with possible adrenal insuffiency Currently on pressors which are being weaned Currently on albumin for intravascular depletion and low serum albumin Continue ceftriaxone, empirically Started on hydrocortisone Cosyntropin test shows nml response to cosyntropin.  2. Acute metabolic encephalopathy in the setting of hyperammonia Ammonia level is trending down Continue lactulose  abdominal ultrasound showed no evidence of ascites or gallstones or cirrhosis     3. Chronic weeping edema bilateral lower extremities due to stases and immobility Wound care consult appreciated Continue  Cleanse bilateral lower legs with soap and water and pat dry.  Zinc layer from below toes to below knee, secure with self adherent Coban.  Change twice weekly  4. Hyponatremia: This has improved  5. Hypokalemia: This will be repleted and rechecked  6. Schizophrenia: Continue Celexa  7 severe protein calorie malnutrition with low albumin  8. Anasarca from acute on chronic diastolic heart failure normal ECHO 3/18 nml EF however ECHO 11/17 showing grade 1 diastolic dsyfxn Continue Lasix IV q 6   Management plans discussed with the patient and she is in agreement.  CODE STATUS: full  CC TOTAL TIME TAKING CARE OF THIS PATIENT: 35 minutes.   CSW patient from SNF  D/w Dr Alva Garnet  POSSIBLE D/C 2-3 days, DEPENDING ON CLINICAL CONDITION.   Minah Axelrod M.D on 03/04/2017 at 11:01 AM  Between 7am to 6pm - Pager - [phone] After  6pm go to [email or web address] - password [email or web address] [place] [facility]  Office  [phone]  CC: Primary care physician; Lorelee Market, MD  Note: This dictation was prepared with Dragon dictation along with smaller phrase technology. Any transcriptional errors that result from this process are unintentional.

## 2017-03-04 NOTE — Progress Notes (Signed)
   03/04/17 0234  Hot/Cold Interventions  Hot/Cold Interventions Warming blanket  Warming Blanket Removed    Temp 97.36f  oral

## 2017-03-05 LAB — MAGNESIUM: MAGNESIUM: 1.7 mg/dL (ref 1.7–2.4)

## 2017-03-05 LAB — CBC
HCT: 22.5 % — ABNORMAL LOW (ref 35.0–47.0)
Hemoglobin: 7.5 g/dL — ABNORMAL LOW (ref 12.0–16.0)
MCH: 29.8 pg (ref 26.0–34.0)
MCHC: 33.5 g/dL (ref 32.0–36.0)
MCV: 89 fL (ref 80.0–100.0)
PLATELETS: 321 10*3/uL (ref 150–440)
RBC: 2.53 MIL/uL — ABNORMAL LOW (ref 3.80–5.20)
RDW: 17.1 % — ABNORMAL HIGH (ref 11.5–14.5)
WBC: 9.9 10*3/uL (ref 3.6–11.0)

## 2017-03-05 LAB — BPAM RBC
Blood Product Expiration Date: 201805192359
ISSUE DATE / TIME: 201805050250
Unit Type and Rh: 7300

## 2017-03-05 LAB — TYPE AND SCREEN
ABO/RH(D): B POS
Antibody Screen: NEGATIVE
UNIT DIVISION: 0

## 2017-03-05 LAB — BASIC METABOLIC PANEL
Anion gap: 9 (ref 5–15)
CALCIUM: 6.7 mg/dL — AB (ref 8.9–10.3)
CHLORIDE: 109 mmol/L (ref 101–111)
CO2: 23 mmol/L (ref 22–32)
CREATININE: 1.03 mg/dL — AB (ref 0.44–1.00)
GFR calc non Af Amer: >60 mL/min (ref >60)
Glucose, Bld: 117 mg/dL — ABNORMAL HIGH (ref 65–99)
Potassium: 2.8 mmol/L — ABNORMAL LOW (ref 3.5–5.1)
SODIUM: 141 mmol/L (ref 135–145)

## 2017-03-05 LAB — PROCALCITONIN: PROCALCITONIN: 0.27 ng/mL

## 2017-03-05 MED ORDER — POTASSIUM CHLORIDE CRYS ER 20 MEQ PO TBCR
40.0000 meq | EXTENDED_RELEASE_TABLET | Freq: Once | ORAL | Status: AC
  Administered 2017-03-05: 40 meq via ORAL
  Filled 2017-03-05: qty 2

## 2017-03-05 MED ORDER — MAGNESIUM SULFATE 2 GM/50ML IV SOLN
2.0000 g | Freq: Once | INTRAVENOUS | Status: AC
  Administered 2017-03-05: 2 g via INTRAVENOUS
  Filled 2017-03-05: qty 50

## 2017-03-05 MED ORDER — POTASSIUM CHLORIDE CRYS ER 20 MEQ PO TBCR
40.0000 meq | EXTENDED_RELEASE_TABLET | Freq: Two times a day (BID) | ORAL | Status: AC
  Administered 2017-03-05 – 2017-03-06 (×4): 40 meq via ORAL
  Filled 2017-03-05 (×4): qty 2

## 2017-03-05 MED ORDER — POTASSIUM CHLORIDE 10 MEQ/100ML IV SOLN
10.0000 meq | INTRAVENOUS | Status: AC
  Administered 2017-03-05 (×4): 10 meq via INTRAVENOUS
  Filled 2017-03-05 (×4): qty 100

## 2017-03-05 MED ORDER — HYDROCORTISONE NA SUCCINATE PF 100 MG IJ SOLR
50.0000 mg | Freq: Two times a day (BID) | INTRAMUSCULAR | Status: DC
  Administered 2017-03-05: 50 mg via INTRAVENOUS
  Filled 2017-03-05: qty 2

## 2017-03-05 MED ORDER — LEVOTHYROXINE SODIUM 50 MCG PO TABS
50.0000 ug | ORAL_TABLET | Freq: Every day | ORAL | Status: DC
  Administered 2017-03-06 – 2017-03-07 (×2): 50 ug via ORAL
  Filled 2017-03-05 (×3): qty 1

## 2017-03-05 NOTE — Progress Notes (Signed)
No distress No complaints Denies dyspnea and pain Off vasopressors Cognition intact  Vitals:   03/05/17 1000 03/05/17 1100 03/05/17 1200 03/05/17 1300  BP: (!) 79/65 (!) 82/62 (!) 84/65 (!) 84/61  Pulse: 79 83 80 64  Resp: 12 13 16 13   Temp:   97.7 F (36.5 C)   TempSrc:   Oral   SpO2: 98% 97% 97% 100%  Weight:      Height:       NAD HEENT WNL JVP cannot be visualized No wheezes Distant HS, no M Obese, soft, +BS BLE in wraps. Chronic stasis changes, symmetric edema No focal neurological deficits  BMP Latest Ref Rng & Units 03/05/2017 03/04/2017 03/04/2017  Glucose 65 - 99 mg/dL 117(H) 147(H) 124(H)  BUN 6 - 20 mg/dL <5(L) <5(L) 6  Creatinine 0.44 - 1.00 mg/dL 1.03(H) 1.09(H) 1.04(H)  Sodium 135 - 145 mmol/L 141 141 139  Potassium 3.5 - 5.1 mmol/L 2.8(L) 2.8(L) 2.8(L)  Chloride 101 - 111 mmol/L 109 111 113(H)  CO2 22 - 32 mmol/L 23 23 21(L)  Calcium 8.9 - 10.3 mg/dL 6.7(L) 7.0(L) 6.9(L)   CBC Latest Ref Rng & Units 03/05/2017 03/04/2017 03/03/2017  WBC 3.6 - 11.0 K/uL 9.9 12.7(H) 9.6  Hemoglobin 12.0 - 16.0 g/dL 7.5(L) 9.1(L) 7.7(L)  Hematocrit 35.0 - 47.0 % 22.5(L) 28.4(L) 23.6(L)  Platelets 150 - 440 K/uL 321 462(H) 387   No new CXR  IMPRESSION: Hypotension without overt shock  BP remains soft off vasopressors but cognition intact and Uo adequate Severe hypoalbuminemia Anasarca Refractory hypokalemia Elevated ammonia level with normal LFTs otherwise Concern for possible adrenal insufficiency - cortisol marginal X 2 despite hypotension   PLAN/REC: Transfer to med-surg MAP goal > 55 mmHg Hold on further diuresis for now Replete KCl aggressively Complete 5 days ceftriaxone Cont midodrine Decrease hydrocortisone to 50 mg q 12 hrs  Would taper to off as able and consider repeat attempt @ cosyntropin stim test  After transfer, PCCM will sign off. Please call if we can be of further assistance  Merton Border, MD PCCM service Mobile [phone] Pager  [phone] 03/05/2017 1:52 PM

## 2017-03-05 NOTE — Progress Notes (Signed)
[place] at [place] NAME: [name]    MR#:  [id number]  DATE OF BIRTH:  08-24-1971  SUBJECTIVE:   Patient's Mental status is back to baseline. Still on low-dose levo fed.   REVIEW OF SYSTEMS:    Review of Systems  Constitutional: Negative for fever, chills weight loss She was hypothermic HENT: Negative for ear pain, nosebleeds, congestion, facial swelling, rhinorrhea, neck pain, neck stiffness and ear discharge.   Respiratory: Negative for cough, shortness of breath, wheezing  Cardiovascular: Negative for chest pain, palpitations  She has chronic lower extremity edema   Gastrointestinal: Negative for heartburn, abdominal pain, vomiting, diarrhea or consitpation Genitourinary: Negative for dysuria, urgency, frequency, hematuria Musculoskeletal: Negative for back pain or joint pain Neurological: Negative for dizziness, seizures, syncope, focal weakness,  numbness and headaches. Mental status Back to baseline  Hematological: Does not bruise/bleed easily.  Psychiatric/Behavioral: Negative for hallucinations, confusion, dysphoric mood She has diagnosis of schizophrenia    Tolerating Diet:  yes     DRUG ALLERGIES:   Allergies  Allergen Reactions  . Peanut Oil Other (See Comments)    Face turns red  . Risperidone And Related Cough    VITALS:  Blood pressure (!) 89/60, pulse 61, temperature 97.6 F (36.4 C), temperature source Oral, resp. rate 15, height 5\' 6"  (1.676 m), weight 118.3 kg (260 lb 14.4 oz), SpO2 97 %.  PHYSICAL EXAMINATION:  Constitutional: Appears well-developed and well-nourished. No distress.  HENT: Normocephalic. Marland Kitchen Oropharynx is clear and moist.  Eyes: Conjunctivae and EOM are normal. PERRLA, no scleral icterus.  Neck: Normal ROM. Neck supple. No JVD. No tracheal deviation. CVS: RRR, S1/S2 +, no murmurs, no gallops, no carotid bruit.  Pulmonary: Effort and breath sounds normal, no stridor, rhonchi,  wheezes, rales.  Abdominal: Soft. BS +,  no distension, tenderness, rebound or guarding.  Musculoskeletal: Normal range of motion.   Neuro: Alert. CN 2-12 grossly intact. No focal deficits. Skin: Unna boots bilateral  Psychiatric: Flat affect  LABORATORY PANEL:   CBC  Recent Labs Lab 03/05/17 0455  WBC 9.9  HGB 7.5*  HCT 22.5*  PLT 321   ------------------------------------------------------------------------------------------------------------------  Chemistries   Recent Labs Lab 03/03/17 2048 03/04/17 0456  03/05/17 0455  NA 135 139  < > 141  K 2.7* 2.8*  < > 2.8*  CL 111 113*  < > 109  CO2 20* 21*  < > 23  GLUCOSE 76 124*  < > 117*  BUN 7 6  < > <5*  CREATININE 1.02* 1.04*  < > 1.03*  CALCIUM 6.7* 6.9*  < > 6.7*  MG 1.6* 2.0  --   --   AST 43*  --   --   --   ALT 45  --   --   --   ALKPHOS 107  --   --   --   BILITOT 0.4  --   --   --   < > = values in this interval not displayed. ------------------------------------------------------------------------------------------------------------------  Cardiac Enzymes  Recent Labs Lab 03/02/17 1600  TROPONINI <0.03   ------------------------------------------------------------------------------------------------------------------  RADIOLOGY:  Dg Chest Port 1 View  Result Date: 03/03/2017 CLINICAL DATA:  Encounter for central venous catheter placement EXAM: PORTABLE CHEST 1 VIEW COMPARISON:  03/02/2017 FINDINGS: Right IJ catheter tip is at the cavoatrial junction. No pneumothorax identified. Normal heart size. No pleural effusion or edema. No airspace opacities. IMPRESSION: Tip of the right IJ catheter is in the projection of  the cavoatrial junction. Electronically Signed   By: Kerby Moors M.D.   On: 03/03/2017 21:00   US Abdomen Limited Ruq  Result Date: 03/03/2017 CLINICAL DATA:  Hyperammonemia, history Crohn's disease, diabetes mellitus, hypertension EXAM: US ABDOMEN LIMITED - RIGHT UPPER QUADRANT COMPARISON:   CT abdomen and pelvis 03/14/2012 FINDINGS: Gallbladder: Normally distended without stones or wall thickening. No pericholecystic fluid or sonographic Murphy sign. Common bile duct: Diameter: 4 mm diameter, normal Liver: Degradation of image quality secondary to increased hepatic echogenicity and body habitus. Echogenic hepatic parenchyma likely representing fatty infiltration though this can be seen with cirrhosis and certain infiltrative disorders. No focal hepatic mass or nodularity. Hepatopetal portal venous flow. No RIGHT upper quadrant free fluid. IMPRESSION: Probable fatty infiltration of liver as above. Otherwise negative exam. Electronically Signed   By: Lavonia Dana M.D.   On: 03/03/2017 08:37     ASSESSMENT AND PLAN:    46 year old female discharged in April due to sepsis from urinary tract infection presents with acute metabolic encephalopathy And found to have sepsis  1.Hypotension, without septic shock: Unclear etiology Due to intravascular hypovolemia  Currently on pressors which are being weanedAs per nursing report Currently on albumin for intravascular depletion and low serum albumin Consider discontinuing ceftriaxone is patient's blood cultures and urine culture negative  Wean hydrocortisone over the next few days Cosyntropin test shows nml response to cosyntropin.  2. Acute metabolic encephalopathy in the setting of hyperammonia  elevation in ammonia level is unclear etiology Ammonia level has trended down Mental status is improved Consider lactulose twice a day  abdominal ultrasound showed no evidence of ascites or gallstones or cirrhosis  3. Chronic weeping edema bilateral lower extremities due to stasis and immobility Continue Unna boots Wound care consult appreciated  Cleanse bilateral lower legs with soap and water and pat dry.  Zinc layer from below toes to below knee, secure with self adherent Coban.  Change twice weekly  4. Acute on chronic anemia: Hemoglobin  7.5 May consider 1 unit of blood transfusion which may increase blood pressure  5. Hypokalemia: This will be repleted and rechecked  Magnesium level should also be checked  6. Schizophrenia: Continue Celexa  7 severe protein calorie malnutrition with low albumin  8. Anasarca from acute on chronic diastolic heart failure normal ECHO 3/18 nml EF however ECHO 11/17 showing grade 1 diastolic dsyfxn Continue Lasix IV q 6  9. Hypothyroidism: Continue Synthroid  TSH slightly elevated  Consider ordering free T4   Management plans discussed with the patient and she is in agreement.  CODE STATUS: full  TOTAL TIME TAKING CARE OF THIS PATIENT: 29 minutes.   CSW patient from SNF  Will D/w Dr Alva Garnet  POSSIBLE D/C 2-3 days, DEPENDING ON CLINICAL CONDITION.   Allesandra Huebsch M.D on 03/05/2017 at 8:27 AM  Between 7am to 6pm - Pager - [phone] After 6pm go to [email or web address] - password [email or web address] [facility]  Office  [phone]  CC: Primary care physician; Lorelee Market, MD  Note: This dictation was prepared with Dragon dictation along with smaller phrase technology. Any transcriptional errors that result from this process are unintentional.

## 2017-03-05 NOTE — Progress Notes (Signed)
Called report to Andee Poles, RN for handoff of patient to Oncology floor Rm 103.  Notified Danielle, RN of removal of Foley cath and maintaining central line access (rt subclavian) as per Dr. Alva Garnet.

## 2017-03-05 NOTE — Progress Notes (Signed)
Contacted patient's legal guardian, Daiva Eves to notify her of the patient's transfer to the floor, Rm 103.

## 2017-03-06 LAB — BASIC METABOLIC PANEL
ANION GAP: 4 — AB (ref 5–15)
BUN: <5 mg/dL — ABNORMAL LOW (ref 6–20)
CALCIUM: 7.4 mg/dL — AB (ref 8.9–10.3)
CO2: 23 mmol/L (ref 22–32)
CREATININE: 0.91 mg/dL (ref 0.44–1.00)
Chloride: 110 mmol/L (ref 101–111)
GFR calc non Af Amer: >60 mL/min (ref >60)
Glucose, Bld: 99 mg/dL (ref 65–99)
Potassium: 4 mmol/L (ref 3.5–5.1)
SODIUM: 137 mmol/L (ref 135–145)

## 2017-03-06 LAB — CBC
HCT: 23.4 % — ABNORMAL LOW (ref 35.0–47.0)
HEMOGLOBIN: 7.8 g/dL — AB (ref 12.0–16.0)
MCH: 29.8 pg (ref 26.0–34.0)
MCHC: 33.2 g/dL (ref 32.0–36.0)
MCV: 89.8 fL (ref 80.0–100.0)
PLATELETS: 296 10*3/uL (ref 150–440)
RBC: 2.61 MIL/uL — AB (ref 3.80–5.20)
RDW: 17.2 % — ABNORMAL HIGH (ref 11.5–14.5)
WBC: 10.9 10*3/uL (ref 3.6–11.0)

## 2017-03-06 MED ORDER — HYDROCORTISONE NA SUCCINATE PF 100 MG IJ SOLR
25.0000 mg | Freq: Two times a day (BID) | INTRAMUSCULAR | Status: DC
  Administered 2017-03-06: 22:00:00 25 mg via INTRAVENOUS
  Filled 2017-03-06: qty 2

## 2017-03-06 MED ORDER — LACTULOSE 10 GM/15ML PO SOLN
30.0000 g | Freq: Two times a day (BID) | ORAL | Status: DC
  Administered 2017-03-06 – 2017-03-07 (×3): 30 g via ORAL
  Filled 2017-03-06: qty 60

## 2017-03-06 MED ORDER — FUROSEMIDE 40 MG PO TABS
40.0000 mg | ORAL_TABLET | Freq: Every day | ORAL | Status: DC
  Administered 2017-03-06 – 2017-03-07 (×2): 40 mg via ORAL
  Filled 2017-03-06 (×2): qty 1

## 2017-03-06 MED ORDER — ENSURE ENLIVE PO LIQD
237.0000 mL | Freq: Two times a day (BID) | ORAL | Status: DC
  Administered 2017-03-06 – 2017-03-07 (×2): 237 mL via ORAL

## 2017-03-06 MED ORDER — ADULT MULTIVITAMIN W/MINERALS CH
1.0000 | ORAL_TABLET | Freq: Every day | ORAL | Status: DC
  Administered 2017-03-06 – 2017-03-07 (×2): 1 via ORAL
  Filled 2017-03-06 (×2): qty 1

## 2017-03-06 NOTE — Progress Notes (Signed)
[facility] [place] NAME: [name]    MR#:  [id number]  DATE OF BIRTH:  Jul 28, 1971  SUBJECTIVE:   Patient doing well this morning. She has been off of pressors. She denies dizziness or lightheadedness.  REVIEW OF SYSTEMS:    Review of Systems  Constitutional: Negative for fever, chills weight loss  HENT: Negative for ear pain, nosebleeds, congestion, facial swelling, rhinorrhea, neck pain, neck stiffness and ear discharge.   Respiratory: Negative for cough, shortness of breath, wheezing  Cardiovascular: Negative for chest pain, palpitations  She has chronic lower extremity edema   Gastrointestinal: Negative for heartburn, abdominal pain, vomiting, diarrhea or consitpation Genitourinary: Negative for dysuria, urgency, frequency, hematuria Musculoskeletal: Negative for back pain or joint pain Neurological: Negative for dizziness, seizures, syncope, focal weakness,  numbness and headaches. Mental status Back to baseline  Hematological: Does not bruise/bleed easily.  Psychiatric/Behavioral: Negative for hallucinations, confusion, dysphoric mood She has diagnosis of schizophrenia    Tolerating Diet:  yes     DRUG ALLERGIES:   Allergies  Allergen Reactions  . Peanut Oil Other (See Comments)    Face turns red  . Risperidone And Related Cough    VITALS:  Blood pressure (!) 95/49, pulse 65, temperature (!) 96.5 F (35.8 C), temperature source Axillary, resp. rate 20, height 5\' 6"  (1.676 m), weight 118.3 kg (260 lb 14.4 oz), SpO2 100 %.  PHYSICAL EXAMINATION:  Constitutional: Appears well-developed and well-nourished. No distress.  HENT: Normocephalic. Marland Kitchen Oropharynx is clear and moist.  Eyes: Conjunctivae and EOM are normal. PERRLA, no scleral icterus.  Neck: Normal ROM. Neck supple. No JVD. No tracheal deviation. CVS: RRR, S1/S2 +, no murmurs, no gallops, no carotid bruit.  Pulmonary: Effort and breath sounds normal, no  stridor, rhonchi, wheezes, rales.  Abdominal: Soft. BS +,  no distension, tenderness, rebound or guarding.  Musculoskeletal: Normal range of motion.   Neuro: Alert. CN 2-12 grossly intact. No focal deficits. Skin: Unna boots bilateral  Psychiatric: Flat affect  LABORATORY PANEL:   CBC  Recent Labs Lab 03/06/17 0444  WBC 10.9  HGB 7.8*  HCT 23.4*  PLT 296   ------------------------------------------------------------------------------------------------------------------  Chemistries   Recent Labs Lab 03/03/17 2048  03/05/17 0455 03/06/17 0444  NA 135  < > 141 137  K 2.7*  < > 2.8* 4.0  CL 111  < > 109 110  CO2 20*  < > 23 23  GLUCOSE 76  < > 117* 99  BUN 7  < > <5* <5*  CREATININE 1.02*  < > 1.03* 0.91  CALCIUM 6.7*  < > 6.7* 7.4*  MG 1.6*  < > 1.7  --   AST 43*  --   --   --   ALT 45  --   --   --   ALKPHOS 107  --   --   --   BILITOT 0.4  --   --   --   < > = values in this interval not displayed. ------------------------------------------------------------------------------------------------------------------  Cardiac Enzymes  Recent Labs Lab 03/02/17 1600  TROPONINI <0.03   ------------------------------------------------------------------------------------------------------------------  RADIOLOGY:  No results found.   ASSESSMENT AND PLAN:    46 year old female discharged in April due to sepsis from urinary tract infection presents with acute metabolic encephalopathy And found to have sepsis  1.Hypotension, without septic shock: Due to intravascular hypovolemia  She has been off of pressors. Currently on albumin for intravascular depletion and low serum albumin Discontinue  ceftriaxone tomorrow. Blood cultures and urine culture are negative .  Wean hydrocortisone over the next few days Cosyntropin test shows marginal nml response to cosyntropin. Continue to drain 2. Acute metabolic encephalopathy in the setting of hyperammonia  elevation in  ammonia level is unclear etiology Ammonia level has trended down Mental status is at baseline Continue lactulose twice a day Abdominal ultrasound showed no evidence of ascites or gallstones or cirrhosis  3. Chronic weeping edema bilateral lower extremities due to stasis and immobility Continue Unna boots Wound care consult appreciated Cleanse bilateral lower legs with soap and water and pat dry.  Zinc layer from below toes to below knee, secure with self adherent Coban.  Change twice weekly  4. Acute on chronic anemia: Hemoglobin 7.8 and stable. 5. Hypokalemia: This is resolved  Magnesium level was within normal limits.  6. Schizophrenia: Continue Celexa  7 severe protein calorie malnutrition with low albumin: Dietary consult  8. Anasarca from acute on chronic diastolic heart failure normal ECHO 3/18 nml EF however ECHO 11/17 showing grade 1 diastolic dsyfxn She has diuresed with IV Lasix Changed to oral Lasix  9. Hypothyroidism: Continue Synthroid  TSH slightly elevated, T4 normal  Management plans discussed with the patient and she is in agreement.  CODE STATUS: full  TOTAL TIME TAKING CARE OF THIS PATIENT: 25 minutes.   CSW patient from SNF    POSSIBLE D/C tomorrow, DEPENDING ON CLINICAL CONDITION.   Maleeka Sabatino M.D on 03/06/2017 at 9:31 AM  Between 7am to 6pm - Pager - [phone] After 6pm go to [email or web address] - password [email or web address] [facility]  Office  [phone]  CC: Primary care physician; Lorelee Market, MD  Note: This dictation was prepared with Dragon dictation along with smaller phrase technology. Any transcriptional errors that result from this process are unintentional.

## 2017-03-06 NOTE — Care Management Important Message (Signed)
Important Message  Patient Details  Name: [name] MRN: [id number] Date of Birth: [birth year]   Medicare Important Message Given:  Yes    Shelbie Ammons, RN 03/06/2017, 10:35 AM

## 2017-03-06 NOTE — Progress Notes (Signed)
Initial Nutrition Assessment  DOCUMENTATION CODES:   Morbid obesity; Patient does not meet criteria for malnutrition at this time, but is at risk for malnutrition  INTERVENTION:  Provide Ensure Enlive po BID, each supplement provides 350 kcal and 20 grams of protein. Patient prefers chocolate.  Provide multivitamin with minerals daily.  Encouraged patient to order a variety of foods that she can tolerate at meals.  NUTRITION DIAGNOSIS:   Inadequate oral intake related to nausea, other (see comment) (diarrhea) as evidenced by per patient/family report, energy intake < or equal to 75% for > or equal to 1 month.  GOAL:   Patient will meet greater than or equal to 90% of their needs  MONITOR:   PO intake, Supplement acceptance, Labs, Weight trends, I & O's  REASON FOR ASSESSMENT:   Consult Assessment of nutrition requirement/status  ASSESSMENT:   46 year old female with PMHx of Crohn's disease, DM type 2, hiatal hernia, hypothyroidism, HLD, GERD, HTN, vitamin D deficiency, cellulitis, hx of TBI, schizophrenia admitted from [facility] for AMS, found to have acute metabolic encephalopathy due to elevated ammonia, hypotension, anasarca from acute on chronic diastolic heart failure.   -Per chart, patient has been at [facility] for STR. She is a resident of [facility], where she will return after STR.  Spoke with patient at bedside. She reports she "can't eat anything." Later specified that she has only been able to tolerate cereal for all of her meals for about one month. She reports this is due to nausea, which she has had for two months and is exacerbated by smells of food. Also endorses diarrhea for the past month. Per chart patient continues to have diarrhea this admission (however is also on lactulose and magnesium oxide). Patient denies any abdominal pain, or difficulty chewing/swallowing. She reports that for each meal she has had frosted flakes with two 8 oz  cartons of whole milk three times per day. Here she has also been ordering some apple juice with meals. This would be a total daily intake of approximately 1446 kcal (71% minimum estimated kcal needs) and 51 grams of protein (54% minimum estimated protein needs). Due to lack of variety in diet, patient lacking many micronutrients, she reports she is not currently taking a MVI. After review of menu, patient is able to tolerate some other items on the menu besides cereal. She also can tolerate yogurt, chicken salad sandwich, grits, fruit, pasta, and deli sandwiches. RD circled these items on menu and patient reports she will order some with her future meals. She is allergic to nuts. Patient enjoys Ensure and is amenable to drinking here. RD brought patient chocolate Ensure at end of assessment per patient request.  Patient reports her UBW is around 260 lbs and she is surprised she has not lost weight. Per chart she has gained some weight since last admission. It appears her weight still fluctuates with fluid. Current body weight likely falsely elevated with fluid, so true weight status unknown.  Medications reviewed and include: acidophilus, Lasix 40 mg daily, lactulose 30 grams BID, levothyroxine, magnesium oxide 400 mg BID, midodrine, pantoprazole, potassium chloride 40 mEq BID, ceftriaxone.  Labs reviewed: BUN <5, Hgb 7.8. HCT 23.4, RDW 17.2. On 3/28 vitamin B12 was 758.  Nutrition-Focused physical exam completed. Findings are no fat depletion, no muscle depletion, and severe edema. Weeping on bilateral legs. Patient very pale. Hair seemed sparse, but patient reports it has not been falling out more than usual and was not  easily plucked on exam.  Patient is at risk of malnutrition due to energy intake <75% for > 7 days (past month per patient report). She does not meet any other criteria at this time including weight loss or fat/muscle loss on NFPE.  Discussed with RN.  Diet Order:  DIET SOFT Room  service appropriate? Yes; Fluid consistency: Thin  Skin:  Wound (see comment) (MSAD to buttocks, groin, sacrum)  Last BM:  03/06/2017 - type 7 per chart  Height:   Ht Readings from Last 1 Encounters:  03/02/17 5\' 6"  (1.676 m)    Weight:   Wt Readings from Last 1 Encounters:  03/02/17 260 lb 14.4 oz (118.3 kg)    Ideal Body Weight:  59.1 kg  BMI:  Body mass index is 42.11 kg/m.  Estimated Nutritional Needs:   Kcal:  2030-2215 (MSJ x 1.1-1.2)  Protein:  95-120 grams (0.8-1 grams/kg)  Fluid:  2 L/day  EDUCATION NEEDS:   No education needs identified at this time  Willey Blade, MS, RD, LDN Pager: [phone] After Hours Pager: [phone]

## 2017-03-07 LAB — BASIC METABOLIC PANEL
ANION GAP: 2 — AB (ref 5–15)
BUN: <5 mg/dL — ABNORMAL LOW (ref 6–20)
CALCIUM: 6.7 mg/dL — AB (ref 8.9–10.3)
CO2: 23 mmol/L (ref 22–32)
Chloride: 118 mmol/L — ABNORMAL HIGH (ref 101–111)
Creatinine, Ser: 0.79 mg/dL (ref 0.44–1.00)
GLUCOSE: 85 mg/dL (ref 65–99)
POTASSIUM: 3.5 mmol/L (ref 3.5–5.1)
Sodium: 143 mmol/L (ref 135–145)

## 2017-03-07 LAB — CBC
HCT: 24.1 % — ABNORMAL LOW (ref 35.0–47.0)
Hemoglobin: 8.1 g/dL — ABNORMAL LOW (ref 12.0–16.0)
MCH: 31.2 pg (ref 26.0–34.0)
MCHC: 33.5 g/dL (ref 32.0–36.0)
MCV: 93.2 fL (ref 80.0–100.0)
PLATELETS: 320 10*3/uL (ref 150–440)
RBC: 2.58 MIL/uL — AB (ref 3.80–5.20)
RDW: 17.3 % — AB (ref 11.5–14.5)
WBC: 10.3 10*3/uL (ref 3.6–11.0)

## 2017-03-07 MED ORDER — MENTHOL 3 MG MT LOZG
1.0000 | LOZENGE | OROMUCOSAL | Status: DC | PRN
  Filled 2017-03-07: qty 9

## 2017-03-07 MED ORDER — LACTULOSE 10 GM/15ML PO SOLN: 30.0000 g | Freq: Two times a day (BID) | ORAL | 0 refills | Status: DC

## 2017-03-07 MED ORDER — PREDNISONE 10 MG PO TABS: 10.0000 mg | ORAL_TABLET | Freq: Every day | ORAL | 0 refills | Status: DC

## 2017-03-07 MED ORDER — LEVOTHYROXINE SODIUM 50 MCG PO TABS: 50.0000 ug | ORAL_TABLET | Freq: Every day | ORAL | 0 refills | Status: DC

## 2017-03-07 MED ORDER — ENSURE ENLIVE PO LIQD: 237.0000 mL | Freq: Two times a day (BID) | ORAL | 12 refills | Status: DC

## 2017-03-07 MED ORDER — FUROSEMIDE 40 MG PO TABS: 40.0000 mg | ORAL_TABLET | Freq: Every day | ORAL | 0 refills | Status: DC

## 2017-03-07 NOTE — Clinical Social Work Note (Signed)
Pt is ready for discharge today and will return to [facility]. CSW updated pt's aunt, who is aware and agreeable. Facility is ready for pt to return as they have received discharge information. RN will call report. [facility] will provide transportation. CSW is signing off as no further needs identified.   Darden Dates, MSW, LCSW  Clinical Social Worker  [phone]

## 2017-03-07 NOTE — Progress Notes (Signed)
Patient updated on discharge plan back to [facility] and agrees. Report called to [place], Therapist, sports at accepting facility.

## 2017-03-07 NOTE — Discharge Summary (Signed)
[facility] NAME: [name]    MR#:  [id number]  DATE OF BIRTH:  09/16/1971  DATE OF ADMISSION:  03/02/2017 ADMITTING PHYSICIAN: Loletha Grayer, MD  DATE OF DISCHARGE: 03/07/2017  PRIMARY CARE PHYSICIAN: Lorelee Market, MD    ADMISSION DIAGNOSIS:  Hepatic encephalopathy ([place]) [K72.90] Stupor [R40.1] Hyperammonemia ([place]) [E72.20] Elevated lactic acid level [R79.89]  DISCHARGE DIAGNOSIS:  Active Problems:   Hyperammonemia (HCC)   Elevated lactic acid level     SECONDARY DIAGNOSIS:   Past Medical History:  Diagnosis Date  . Abnormal mammogram, unspecified 2013   Prev. cytology,hypercellular smears without evidence of malignant cells. The cytopathologist questioned if samples truly representative. Care taken during sampling and is felt to be representative.  . Breast screening, unspecified 2013  . Broken leg    age 6  . Cellulitis   . Crohn's disease ([place]) 2013  . Depression   . Diabetes mellitus without complication (HCC)    non insulin dependent  . Early menopause   . Edema   . GERD (gastroesophageal reflux disease)   . Hiatal hernia 2013  . Hyperlipidemia   . Hypertension   . Hypothyroidism   . 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 Obesity, unspecified 2013  . Other sign and symptom in breast 2013   Right bst US,lower outer quadrant,A single 0.3x0.4x0.6cm hypoechoic mass with slightly lobulated borders with adjacent 0.3x0.4x0.5cm mass was noted. The 1st was 5cm from nipple, 2nd at 8 cm from the nipple. The previous lesion aspirate was at 3 0'clock position. These lesions are thought to account for the mammographic abnormality. Minimal interval change on [place].   [facility])   . Rib fracture    age 20  . Schizophrenia ([place])   . TBI (traumatic brain injury) ([place])   . Thyroid disease    hypothyroid  . Vitamin D deficiency     HOSPITAL COURSE:   46 year old female  discharged in April due to sepsis from urinary tract infection presents with acute metabolic encephalopathy And found to have sepsis  1.Hypotension, without septic shock with history of chronic hypotension: This was due to intravascular hypovolemia  She has been off of pressors and albumin for intravascular depletion and low serum albumin. Patient was treated empirically with antibiotics for total of 5 days. She does not need antibiotics at discharge. Blood cultures and urine culture are negative .  She was started on hydrocortisone for possible adrenal insufficiency. Hydrocortisone has been tapered off. She will be on prednisone taper at discharge. I am recommending a cosyntropin test as an outpatient after prednisone taper. Cosyntropin test shows marginal nml response to cosyntropin. She will continue  Midodrine, which she was on prior to admission due to history of hypotension.   2. Acute metabolic encephalopathy in the setting of hyperammonia  The elevation in ammonia level is unclear etiology Ammonia level has trended down. Mental status is at baseline She will Continue lactulose twice a day Abdominal ultrasound showed no evidence of ascites or gallstones or cirrhosis  3. Chronic weeping edema bilateral lower extremities due to stasis and immobility Continue Unna boots. This should be changed weekly Wound care consult needed at skilled nursing facility Cleanse bilateral lower legs with soap and water and pat dry. Zinc layer from below toes to below knee, secure with self adherent Cob[place]. Change twice weekly  4. Acute on chronic anemia: Hemoglobin is 8.1 this morning in  stable. 5. Hypokalemia: This is resolved  Magnesium level was within normal limits.  6. Schizophrenia: Continue Celexa  7 severe protein calorie malnutrition with low albumin: Continue nutritional supplements  8. Anasarca from acute on chronic diastolic heart failure normal ECHO 3/18 nml EF however ECHO 11/17  showing grade 1 diastolic dsyfxn She has diuresed with IV Lasix She has been changed to oral Lasix  9. Hypothyroidism: She will Continue Synthroid  TSH slightly elevated, T4 normal I am recommending repeat thyroid function test in 4 weeks  DISCHARGE CONDITIONS AND DIET:   Stable for discharge on heart healthy diet  CONSULTS OBTAINED:    DRUG ALLERGIES:   Allergies  Allergen Reactions  . Peanut Oil Other (See Comments)    Face turns red  . Risperidone And Related Cough    DISCHARGE MEDICATIONS:   Current Discharge Medication List    START taking these medications   Details  feeding supplement, ENSURE ENLIVE, (ENSURE ENLIVE) LIQD Take 237 mLs by mouth 2 (two) times daily between meals. Qty: 237 mL, Refills: 12    furosemide (LASIX) 40 MG tablet Take 1 tablet (40 mg total) by mouth daily. Qty: 30 tablet, Refills: 0    lactulose (CHRONULAC) 10 GM/15ML solution Take 45 mLs (30 g total) by mouth 2 (two) times daily. Qty: 240 mL, Refills: 0    levothyroxine (SYNTHROID, LEVOTHROID) 50 MCG tablet Take 1 tablet (50 mcg total) by mouth daily before breakfast. Qty: 30 tablet, Refills: 0    predniSONE (DELTASONE) 10 MG tablet Take 1 tablet (10 mg total) by mouth daily with breakfast. 50 mg daily for 1 day then taper by 10 mg every day At 10 mg for one day then stop Qty: 20 tablet, Refills: 0      CONTINUE these medications which have NOT CHANGED   Details  acidophilus (RISAQUAD) CAPS capsule Take 2 capsules by mouth 3 (three) times daily.    albuterol (PROVENTIL HFA;VENTOLIN HFA) 108 (90 Base) MCG/ACT inhaler Inhale into the lungs every 6 (six) hours as needed for wheezing or shortness of breath.    Amino Acids-Protein Hydrolys (FEEDING SUPPLEMENT, PRO-STAT SUGAR FREE 64,) LIQD Take 30 mLs by mouth 3 (three) times daily with meals.    bisacodyl (DULCOLAX) 5 MG EC tablet Take 5 mg by mouth daily as needed for moderate constipation.    calcium carbonate (OSCAL) 1500 (600  Ca) MG TABS tablet Take 1,500 mg by mouth 2 (two) times daily with a meal.    Cholecalciferol (VITAMIN D3) 5000 units CAPS Take 5,000 Units by mouth daily.    citalopram (CELEXA) 40 MG tablet Take 40 mg by mouth daily.    diphenhydrAMINE (BENADRYL) 25 MG tablet Take 50 mg by mouth at bedtime as needed.    diphenoxylate-atropine (LOMOTIL) 2.5-0.025 MG tablet Take 2 tablets by mouth every 2 (two) hours as needed for diarrhea or loose stools.    gemfibrozil (LOPID) 600 MG tablet Take 600 mg by mouth 2 (two) times daily before a meal.    hyoscyamine (LEVSIN, ANASPAZ) 0.125 MG tablet Take 0.125 mg by mouth every 6 (six) hours as needed.    LORazepam (ATIVAN) 1 MG tablet Take 1 mg by mouth daily.    magnesium oxide (MAGNESIUM-OXIDE) 400 (241.3 Mg) MG tablet Take 400 mg by mouth 2 (two) times daily.    mesalamine (APRISO) 0.375 g 24 hr capsule Take 1,500 mg by mouth daily.    midodrine (PROAMATINE) 10 MG tablet Take 10 mg by mouth 4 (four)  times daily.    Multiple Vitamin (MULTIVITAMIN WITH MINERALS) TABS tablet Take 1 tablet by mouth daily.    omega-3 acid ethyl esters (LOVAZA) 1 g capsule Take 1 g by mouth daily.    pantoprazole (PROTONIX) 40 MG tablet Take 40 mg by mouth daily.    potassium chloride SA (K-DUR,KLOR-CON) 20 MEQ tablet Take 20 mEq by mouth 2 (two) times daily.    pseudoephedrine-guaifenesin (MUCINEX D) 60-600 MG 12 hr tablet Take 1 tablet by mouth every 12 (twelve) hours as needed for congestion.    simvastatin (ZOCOR) 20 MG tablet Take 20 mg by mouth daily.    sucralfate (CARAFATE) 1 g tablet Take 1 g by mouth 4 (four) times daily -  with meals and at bedtime.    vitamin B-12 (CYANOCOBALAMIN) 1000 MCG tablet Take 1,000 mcg by mouth daily.    vitamin C (ASCORBIC ACID) 250 MG tablet Take 500 mg by mouth daily.      STOP taking these medications     cholestyramine light (PREVALITE) 4 g packet      levofloxacin (LEVAQUIN) 500 MG tablet           Today    CHIEF COMPLAINT:  No acute events overnight she is complaining of a sore throat   VITAL SIGNS:  Blood pressure 98/60, pulse 77, temperature 97.8 F (36.6 C), resp. rate 20, height 5\' 6"  (1.676 m), weight 118.3 kg (260 lb 14.4 oz), SpO2 97 %.   REVIEW OF SYSTEMS:  Review of Systems  Constitutional: Negative.  Negative for chills, fever and malaise/fatigue.  HENT: Positive for sore throat. Negative for ear discharge, ear pain, hearing loss and nosebleeds.   Eyes: Negative.  Negative for blurred vision and pain.  Respiratory: Negative.  Negative for cough, hemoptysis, shortness of breath and wheezing.   Cardiovascular: Positive for leg swelling. Negative for chest pain and palpitations.  Gastrointestinal: Negative.  Negative for abdominal pain, blood in stool, diarrhea, nausea and vomiting.  Genitourinary: Negative.  Negative for dysuria.  Musculoskeletal: Negative.  Negative for back pain.  Skin: Negative.   Neurological: Negative for dizziness, tremors, speech change, focal weakness, seizures and headaches.  Endo/Heme/Allergies: Negative.  Does not bruise/bleed easily.  Psychiatric/Behavioral: Negative.  Negative for depression, hallucinations and suicidal ideas.     PHYSICAL EXAMINATION:  GENERAL:  46 y.o.-year-old patient lying in the bed with no acute distress.  NECK:  Supple, no jugular venous distention. No thyroid enlargement, no tenderness.  LUNGS: Normal breath sounds bilaterally, no wheezing, rales,rhonchi  No use of accessory muscles of respiration.  CARDIOVASCULAR: S1, S2 normal. No murmurs, rubs, or gallops.  ABDOMEN: Soft, non-tender, non-distended. Bowel sounds present. No organomegaly or mass.  EXTREMITIES: UNNA WRAPS ON , cyanosis, or clubbing.  PSYCHIATRIC: The patient is alert and oriented x 3.  Slow falt affect SKIN: No obvious rash, lesion, or ulcer.   DATA REVIEW:   CBC  Recent Labs Lab 03/07/17 0430  WBC 10.3  HGB 8.1*  HCT 24.1*  PLT 320     Chemistries   Recent Labs Lab 03/03/17 2048  03/05/17 0455  03/07/17 0430  NA 135  < > 141  < > 143  K 2.7*  < > 2.8*  < > 3.5  CL 111  < > 109  < > 118*  CO2 20*  < > 23  < > 23  GLUCOSE 76  < > 117*  < > 85  BUN 7  < > <5*  < > <5*  CREATININE 1.02*  < > 1.03*  < > 0.79  CALCIUM 6.7*  < > 6.7*  < > 6.7*  MG 1.6*  < > 1.7  --   --   AST 43*  --   --   --   --   ALT 45  --   --   --   --   ALKPHOS 107  --   --   --   --   BILITOT 0.4  --   --   --   --   < > = values in this interval not displayed.  Cardiac Enzymes  Recent Labs Lab 03/02/17 1600  TROPONINI <0.03    Microbiology Results  @MICRORSLT48 @  RADIOLOGY:  No results found.    Current Discharge Medication List    START taking these medications   Details  feeding supplement, ENSURE ENLIVE, (ENSURE ENLIVE) LIQD Take 237 mLs by mouth 2 (two) times daily between meals. Qty: 237 mL, Refills: 12    furosemide (LASIX) 40 MG tablet Take 1 tablet (40 mg total) by mouth daily. Qty: 30 tablet, Refills: 0    lactulose (CHRONULAC) 10 GM/15ML solution Take 45 mLs (30 g total) by mouth 2 (two) times daily. Qty: 240 mL, Refills: 0    levothyroxine (SYNTHROID, LEVOTHROID) 50 MCG tablet Take 1 tablet (50 mcg total) by mouth daily before breakfast. Qty: 30 tablet, Refills: 0    predniSONE (DELTASONE) 10 MG tablet Take 1 tablet (10 mg total) by mouth daily with breakfast. 50 mg daily for 1 day then taper by 10 mg every day At 10 mg for one day then stop Qty: 20 tablet, Refills: 0      CONTINUE these medications which have NOT CHANGED   Details  acidophilus (RISAQUAD) CAPS capsule Take 2 capsules by mouth 3 (three) times daily.    albuterol (PROVENTIL HFA;VENTOLIN HFA) 108 (90 Base) MCG/ACT inhaler Inhale into the lungs every 6 (six) hours as needed for wheezing or shortness of breath.    Amino Acids-Protein Hydrolys (FEEDING SUPPLEMENT, PRO-STAT SUGAR FREE 64,) LIQD Take 30 mLs by mouth 3 (three) times daily  with meals.    bisacodyl (DULCOLAX) 5 MG EC tablet Take 5 mg by mouth daily as needed for moderate constipation.    calcium carbonate (OSCAL) 1500 (600 Ca) MG TABS tablet Take 1,500 mg by mouth 2 (two) times daily with a meal.    Cholecalciferol (VITAMIN D3) 5000 units CAPS Take 5,000 Units by mouth daily.    citalopram (CELEXA) 40 MG tablet Take 40 mg by mouth daily.    diphenhydrAMINE (BENADRYL) 25 MG tablet Take 50 mg by mouth at bedtime as needed.    diphenoxylate-atropine (LOMOTIL) 2.5-0.025 MG tablet Take 2 tablets by mouth every 2 (two) hours as needed for diarrhea or loose stools.    gemfibrozil (LOPID) 600 MG tablet Take 600 mg by mouth 2 (two) times daily before a meal.    hyoscyamine (LEVSIN, ANASPAZ) 0.125 MG tablet Take 0.125 mg by mouth every 6 (six) hours as needed.    LORazepam (ATIVAN) 1 MG tablet Take 1 mg by mouth daily.    magnesium oxide (MAGNESIUM-OXIDE) 400 (241.3 Mg) MG tablet Take 400 mg by mouth 2 (two) times daily.    mesalamine (APRISO) 0.375 g 24 hr capsule Take 1,500 mg by mouth daily.    midodrine (PROAMATINE) 10 MG tablet Take 10 mg by mouth 4 (four) times daily.    Multiple Vitamin (MULTIVITAMIN WITH MINERALS) TABS tablet  Take 1 tablet by mouth daily.    omega-3 acid ethyl esters (LOVAZA) 1 g capsule Take 1 g by mouth daily.    pantoprazole (PROTONIX) 40 MG tablet Take 40 mg by mouth daily.    potassium chloride SA (K-DUR,KLOR-CON) 20 MEQ tablet Take 20 mEq by mouth 2 (two) times daily.    pseudoephedrine-guaifenesin (MUCINEX D) 60-600 MG 12 hr tablet Take 1 tablet by mouth every 12 (twelve) hours as needed for congestion.    simvastatin (ZOCOR) 20 MG tablet Take 20 mg by mouth daily.    sucralfate (CARAFATE) 1 g tablet Take 1 g by mouth 4 (four) times daily -  with meals and at bedtime.    vitamin B-12 (CYANOCOBALAMIN) 1000 MCG tablet Take 1,000 mcg by mouth daily.    vitamin C (ASCORBIC ACID) 250 MG tablet Take 500 mg by mouth daily.       STOP taking these medications     cholestyramine light (PREVALITE) 4 g packet      levofloxacin (LEVAQUIN) 500 MG tablet            Management plans discussed with the patient and she is in agreement. Stable for discharge snf  Patient should follow up with pcp  CODE STATUS:     Code Status Orders        Start     Ordered   03/02/17 1914  Full code  Continuous     03/02/17 1914    Code Status History    Date Active Date Inactive Code Status Order ID Comments User Context   02/14/2017  1:22 AM 02/15/2017  5:07 PM Full Code [id number]  Harvie Bridge, DO Inpatient   01/23/2017  5:26 PM 01/28/2017  5:54 PM Full Code [id number]  Demetrios Loll, MD Inpatient   09/27/2016  8:45 AM 10/18/2016  8:43 PM Full Code [id number]  Flora Lipps, MD ED    Advance Directive Documentation     Most Recent Value  Type of Advance Directive  Healthcare Power of Attorney  Pre-existing out of facility DNR order (yellow form or pink MOST form)  -  "MOST" Form in Place?  -      TOTAL TIME TAKING CARE OF THIS PATIENT: 38 minutes.    Note: This dictation was prepared with Dragon dictation along with smaller phrase technology. Any transcriptional errors that result from this process are unintentional.  Rollin Kotowski M.D on 03/07/2017 at 6:45 AM  Between 7am to 6pm - Pager - [phone] After 6pm go to [email or web address] - password [email or web address] [facility]  Office  [phone]  CC: Primary care physician; Lorelee Market, MD

## 2017-03-09 ENCOUNTER — Other Ambulatory Visit
Admission: RE | Admit: 2017-03-09 | Discharge: 2017-03-09 | Disposition: A | Discharge status: Home / Self Care | Payer: Medicare Other | Source: Ambulatory Visit | Attending: Family Medicine | Admitting: Family Medicine

## 2017-03-09 DIAGNOSIS — R7889 Finding of other specified substances, not normally found in blood: Secondary | ICD-10-CM | POA: Diagnosis present

## 2017-03-09 LAB — CULTURE, BLOOD (ROUTINE X 2)
CULTURE: NO GROWTH
Culture: NO GROWTH
Special Requests: ADEQUATE
Special Requests: ADEQUATE

## 2017-03-09 LAB — AMMONIA: Ammonia: 17 umol/L (ref 9–35)

## 2017-03-13 ENCOUNTER — Other Ambulatory Visit
Admission: RE | Admit: 2017-03-13 | Discharge: 2017-03-13 | Disposition: A | Discharge status: Home / Self Care | Payer: Medicare Other | Source: Ambulatory Visit | Attending: Family Medicine | Admitting: Family Medicine

## 2017-03-13 DIAGNOSIS — F209 Schizophrenia, unspecified: Secondary | ICD-10-CM | POA: Diagnosis present

## 2017-03-13 LAB — AMMONIA: AMMONIA: 15 umol/L (ref 9–35)

## 2017-04-03 ENCOUNTER — Inpatient Hospital Stay
Admission: EM | Admit: 2017-04-03 | Discharge: 2017-04-05 | DRG: 871 | Disposition: A | Discharge status: Home Health | Payer: Medicare Other | Attending: Specialist | Admitting: Specialist

## 2017-04-03 DIAGNOSIS — A419 Sepsis, unspecified organism: Principal | ICD-10-CM | POA: Diagnosis present

## 2017-04-03 DIAGNOSIS — K5090 Crohn's disease, unspecified, without complications: Secondary | ICD-10-CM | POA: Diagnosis present

## 2017-04-03 DIAGNOSIS — I9589 Other hypotension: Secondary | ICD-10-CM | POA: Diagnosis present

## 2017-04-03 DIAGNOSIS — I878 Other specified disorders of veins: Secondary | ICD-10-CM | POA: Diagnosis present

## 2017-04-03 DIAGNOSIS — Z79899 Other long term (current) drug therapy: Secondary | ICD-10-CM

## 2017-04-03 DIAGNOSIS — F209 Schizophrenia, unspecified: Secondary | ICD-10-CM | POA: Diagnosis present

## 2017-04-03 DIAGNOSIS — E039 Hypothyroidism, unspecified: Secondary | ICD-10-CM | POA: Diagnosis present

## 2017-04-03 DIAGNOSIS — Z800 Family history of malignant neoplasm of digestive organs: Secondary | ICD-10-CM

## 2017-04-03 DIAGNOSIS — R531 Weakness: Secondary | ICD-10-CM | POA: Diagnosis present

## 2017-04-03 DIAGNOSIS — F419 Anxiety disorder, unspecified: Secondary | ICD-10-CM | POA: Diagnosis present

## 2017-04-03 DIAGNOSIS — K7290 Hepatic failure, unspecified without coma: Secondary | ICD-10-CM | POA: Diagnosis present

## 2017-04-03 DIAGNOSIS — E876 Hypokalemia: Secondary | ICD-10-CM | POA: Diagnosis present

## 2017-04-03 DIAGNOSIS — F329 Major depressive disorder, single episode, unspecified: Secondary | ICD-10-CM | POA: Diagnosis present

## 2017-04-03 DIAGNOSIS — L03115 Cellulitis of right lower limb: Secondary | ICD-10-CM

## 2017-04-03 DIAGNOSIS — I10 Essential (primary) hypertension: Secondary | ICD-10-CM | POA: Diagnosis present

## 2017-04-03 DIAGNOSIS — E119 Type 2 diabetes mellitus without complications: Secondary | ICD-10-CM | POA: Diagnosis present

## 2017-04-03 DIAGNOSIS — G9341 Metabolic encephalopathy: Secondary | ICD-10-CM | POA: Diagnosis present

## 2017-04-03 DIAGNOSIS — K219 Gastro-esophageal reflux disease without esophagitis: Secondary | ICD-10-CM | POA: Diagnosis present

## 2017-04-03 HISTORY — DX: Other hypotension: I95.89

## 2017-04-03 HISTORY — DX: Cellulitis of unspecified part of limb: L03.119

## 2017-04-03 LAB — BASIC METABOLIC PANEL
ANION GAP: 10 (ref 5–15)
BUN: 6 mg/dL (ref 6–20)
CHLORIDE: 103 mmol/L (ref 101–111)
CO2: 23 mmol/L (ref 22–32)
Calcium: 7.4 mg/dL — ABNORMAL LOW (ref 8.9–10.3)
Creatinine, Ser: 1.08 mg/dL — ABNORMAL HIGH (ref 0.44–1.00)
GFR calc non Af Amer: >60 mL/min (ref >60)
Glucose, Bld: 88 mg/dL (ref 65–99)
POTASSIUM: 2.7 mmol/L — AB (ref 3.5–5.1)
Sodium: 136 mmol/L (ref 135–145)

## 2017-04-03 LAB — LACTIC ACID, PLASMA
LACTIC ACID, VENOUS: 3.1 mmol/L — AB (ref 0.5–1.9)
Lactic Acid, Venous: 5.1 mmol/L (ref 0.5–1.9)

## 2017-04-03 LAB — HEPATIC FUNCTION PANEL
ALT: 22 U/L (ref 14–54)
AST: 49 U/L — ABNORMAL HIGH (ref 15–41)
Albumin: 1.7 g/dL — ABNORMAL LOW (ref 3.5–5.0)
Alkaline Phosphatase: 126 U/L (ref 38–126)
BILIRUBIN DIRECT: 0.1 mg/dL (ref 0.1–0.5)
BILIRUBIN INDIRECT: 0.6 mg/dL (ref 0.3–0.9)
TOTAL PROTEIN: 4.9 g/dL — AB (ref 6.5–8.1)
Total Bilirubin: 0.7 mg/dL (ref 0.3–1.2)

## 2017-04-03 LAB — CBC
HEMATOCRIT: 32.3 % — AB (ref 35.0–47.0)
HEMOGLOBIN: 10.9 g/dL — AB (ref 12.0–16.0)
MCH: 30.7 pg (ref 26.0–34.0)
MCHC: 33.7 g/dL (ref 32.0–36.0)
MCV: 91.2 fL (ref 80.0–100.0)
Platelets: 643 10*3/uL — ABNORMAL HIGH (ref 150–440)
RBC: 3.54 MIL/uL — AB (ref 3.80–5.20)
RDW: 16.9 % — ABNORMAL HIGH (ref 11.5–14.5)
WBC: 16 10*3/uL — ABNORMAL HIGH (ref 3.6–11.0)

## 2017-04-03 LAB — AMMONIA: AMMONIA: 59 umol/L — AB (ref 9–35)

## 2017-04-03 LAB — MAGNESIUM: Magnesium: 1.8 mg/dL (ref 1.7–2.4)

## 2017-04-03 MED ORDER — BISACODYL 5 MG PO TBEC: 5.0000 mg | DELAYED_RELEASE_TABLET | Freq: Every day | ORAL | Status: DC | PRN

## 2017-04-03 MED ORDER — GEMFIBROZIL 600 MG PO TABS
600.0000 mg | ORAL_TABLET | Freq: Two times a day (BID) | ORAL | Status: DC
  Administered 2017-04-03 – 2017-04-05 (×4): 600 mg via ORAL
  Filled 2017-04-03 (×5): qty 1

## 2017-04-03 MED ORDER — ENOXAPARIN SODIUM 40 MG/0.4ML ~~LOC~~ SOLN
40.0000 mg | SUBCUTANEOUS | Status: DC
  Administered 2017-04-03 – 2017-04-04 (×2): 40 mg via SUBCUTANEOUS
  Filled 2017-04-03 (×2): qty 0.4

## 2017-04-03 MED ORDER — CALCIUM CARBONATE 1500 (600 CA) MG PO TABS
1500.0000 mg | ORAL_TABLET | Freq: Two times a day (BID) | ORAL | Status: DC
  Filled 2017-04-03 (×3): qty 1

## 2017-04-03 MED ORDER — ACETAMINOPHEN 325 MG PO TABS: 650.0000 mg | ORAL_TABLET | Freq: Four times a day (QID) | ORAL | Status: DC | PRN

## 2017-04-03 MED ORDER — ONDANSETRON HCL 4 MG/2ML IJ SOLN
4.0000 mg | Freq: Four times a day (QID) | INTRAMUSCULAR | Status: DC | PRN
  Administered 2017-04-03 – 2017-04-04 (×3): 4 mg via INTRAVENOUS
  Filled 2017-04-03 (×3): qty 2

## 2017-04-03 MED ORDER — POTASSIUM CHLORIDE CRYS ER 20 MEQ PO TBCR
20.0000 meq | EXTENDED_RELEASE_TABLET | Freq: Every day | ORAL | Status: AC
  Administered 2017-04-04 – 2017-04-05 (×2): 20 meq via ORAL
  Filled 2017-04-03 (×2): qty 1

## 2017-04-03 MED ORDER — FUROSEMIDE 40 MG PO TABS
40.0000 mg | ORAL_TABLET | Freq: Every day | ORAL | Status: DC
  Filled 2017-04-03: qty 1

## 2017-04-03 MED ORDER — ONDANSETRON HCL 4 MG PO TABS: 4.0000 mg | ORAL_TABLET | Freq: Four times a day (QID) | ORAL | Status: DC | PRN

## 2017-04-03 MED ORDER — PANTOPRAZOLE SODIUM 40 MG PO TBEC
40.0000 mg | DELAYED_RELEASE_TABLET | Freq: Every day | ORAL | Status: DC
Start: 2017-04-04 — End: 2017-04-05
  Administered 2017-04-04: 40 mg via ORAL
  Filled 2017-04-03: qty 1

## 2017-04-03 MED ORDER — SODIUM CHLORIDE 0.9 % IV SOLN
Freq: Once | INTRAVENOUS | Status: AC
  Administered 2017-04-03: 16:00:00 via INTRAVENOUS

## 2017-04-03 MED ORDER — ADULT MULTIVITAMIN W/MINERALS CH
1.0000 | ORAL_TABLET | Freq: Every day | ORAL | Status: DC
  Administered 2017-04-04: 1 via ORAL
  Filled 2017-04-03: qty 1

## 2017-04-03 MED ORDER — HYOSCYAMINE SULFATE 0.125 MG PO TABS: 0.1250 mg | ORAL_TABLET | Freq: Four times a day (QID) | ORAL | Status: DC | PRN

## 2017-04-03 MED ORDER — MESALAMINE ER 0.375 G PO CP24
1.5000 g | ORAL_CAPSULE | Freq: Every day | ORAL | Status: DC
  Filled 2017-04-03: qty 4

## 2017-04-03 MED ORDER — VITAMIN C 500 MG PO TABS
500.0000 mg | ORAL_TABLET | Freq: Every day | ORAL | Status: DC
  Administered 2017-04-03 – 2017-04-04 (×2): 500 mg via ORAL
  Filled 2017-04-03 (×2): qty 1

## 2017-04-03 MED ORDER — LACTULOSE 10 GM/15ML PO SOLN
30.0000 g | Freq: Two times a day (BID) | ORAL | Status: DC
  Administered 2017-04-03 – 2017-04-04 (×2): 30 g via ORAL
  Filled 2017-04-03 (×3): qty 60

## 2017-04-03 MED ORDER — MIDODRINE HCL 5 MG PO TABS
10.0000 mg | ORAL_TABLET | Freq: Four times a day (QID) | ORAL | Status: DC
  Administered 2017-04-03 – 2017-04-05 (×7): 10 mg via ORAL
  Filled 2017-04-03 (×7): qty 2

## 2017-04-03 MED ORDER — POTASSIUM CHLORIDE CRYS ER 20 MEQ PO TBCR
40.0000 meq | EXTENDED_RELEASE_TABLET | Freq: Once | ORAL | Status: AC
  Administered 2017-04-03: 40 meq via ORAL
  Filled 2017-04-03: qty 2

## 2017-04-03 MED ORDER — VANCOMYCIN HCL IN DEXTROSE 1-5 GM/200ML-% IV SOLN
1000.0000 mg | Freq: Two times a day (BID) | INTRAVENOUS | Status: DC
  Administered 2017-04-03 – 2017-04-04 (×3): 1000 mg via INTRAVENOUS
  Filled 2017-04-03 (×5): qty 200

## 2017-04-03 MED ORDER — ENSURE ENLIVE PO LIQD: 237.0000 mL | Freq: Two times a day (BID) | ORAL | Status: DC

## 2017-04-03 MED ORDER — CITALOPRAM HYDROBROMIDE 20 MG PO TABS
40.0000 mg | ORAL_TABLET | Freq: Every day | ORAL | Status: DC
  Administered 2017-04-04: 40 mg via ORAL
  Filled 2017-04-03: qty 2

## 2017-04-03 MED ORDER — VANCOMYCIN HCL IN DEXTROSE 1-5 GM/200ML-% IV SOLN
1000.0000 mg | Freq: Once | INTRAVENOUS | Status: AC
  Administered 2017-04-03: 1000 mg via INTRAVENOUS
  Filled 2017-04-03: qty 200

## 2017-04-03 MED ORDER — VITAMIN B-12 1000 MCG PO TABS
1000.0000 ug | ORAL_TABLET | Freq: Every day | ORAL | Status: DC
  Administered 2017-04-04: 1000 ug via ORAL
  Filled 2017-04-03: qty 1

## 2017-04-03 MED ORDER — MAGNESIUM OXIDE 400 (241.3 MG) MG PO TABS
400.0000 mg | ORAL_TABLET | Freq: Two times a day (BID) | ORAL | Status: DC
  Administered 2017-04-03 – 2017-04-04 (×3): 400 mg via ORAL
  Filled 2017-04-03 (×3): qty 1

## 2017-04-03 MED ORDER — LORAZEPAM 1 MG PO TABS
1.0000 mg | ORAL_TABLET | Freq: Every day | ORAL | Status: DC
  Administered 2017-04-04: 1 mg via ORAL
  Filled 2017-04-03: qty 1

## 2017-04-03 MED ORDER — POTASSIUM CHLORIDE IN NACL 20-0.9 MEQ/L-% IV SOLN
INTRAVENOUS | Status: DC
  Administered 2017-04-03: 19:00:00 via INTRAVENOUS
  Filled 2017-04-03 (×5): qty 1000

## 2017-04-03 MED ORDER — VITAMIN D 1000 UNITS PO TABS
5000.0000 [IU] | ORAL_TABLET | Freq: Every day | ORAL | Status: DC
  Administered 2017-04-04: 5000 [IU] via ORAL
  Filled 2017-04-03: qty 5

## 2017-04-03 MED ORDER — CALCIUM CARBONATE ANTACID 500 MG PO CHEW
800.0000 mg | CHEWABLE_TABLET | Freq: Once | ORAL | Status: AC
  Administered 2017-04-03: 800 mg via ORAL
  Filled 2017-04-03: qty 4

## 2017-04-03 MED ORDER — OMEGA-3-ACID ETHYL ESTERS 1 G PO CAPS
1.0000 g | ORAL_CAPSULE | Freq: Every day | ORAL | Status: DC
  Administered 2017-04-04: 1 g via ORAL
  Filled 2017-04-03: qty 1

## 2017-04-03 MED ORDER — ACETAMINOPHEN 650 MG RE SUPP: 650.0000 mg | Freq: Four times a day (QID) | RECTAL | Status: DC | PRN

## 2017-04-03 MED ORDER — SODIUM CHLORIDE 0.9 % IV SOLN
3.0000 g | Freq: Four times a day (QID) | INTRAVENOUS | Status: DC
  Administered 2017-04-03 – 2017-04-05 (×6): 3 g via INTRAVENOUS
  Filled 2017-04-03 (×9): qty 3

## 2017-04-03 MED ORDER — LEVOTHYROXINE SODIUM 50 MCG PO TABS
50.0000 ug | ORAL_TABLET | Freq: Every day | ORAL | Status: DC
  Administered 2017-04-04: 50 ug via ORAL
  Filled 2017-04-03: qty 1

## 2017-04-03 MED ORDER — POTASSIUM CHLORIDE CRYS ER 20 MEQ PO TBCR
40.0000 meq | EXTENDED_RELEASE_TABLET | ORAL | Status: AC
  Administered 2017-04-03 (×2): 40 meq via ORAL
  Filled 2017-04-03 (×2): qty 2

## 2017-04-03 MED ORDER — SODIUM CHLORIDE 0.9 % IV SOLN
3.0000 g | Freq: Once | INTRAVENOUS | Status: AC
  Administered 2017-04-03: 3 g via INTRAVENOUS
  Filled 2017-04-03: qty 3

## 2017-04-03 NOTE — ED Provider Notes (Signed)
[facility] Emergency Department Provider Note  Time seen: 12:32 PM  I have reviewed the triage vital signs and the nursing notes.   HISTORY  Chief Complaint Abnormal Lab    HPI [name] is a 46 y.o. female with a past medical history of cellulitis, depression, Crohn's, diabetes, schizophrenia, presents to the emergency department for an abnormal labs. According to report the patient lives at [facility], was sent to the emergency department for an abnormal lab however it was not reported what the lab was. The patient is unaware why she is here. Denies any symptoms at this time. Has a negative review of systems. Overall the patient does appear somewhat somnolent, she will awaken to voice and answer questions and follow commands.  Past Medical History:  Diagnosis Date  . Abnormal mammogram, unspecified 2013   Prev. cytology,hypercellular smears without evidence of malignant cells. The cytopathologist questioned if samples truly representative. Care taken during sampling and is felt to be representative.  . Breast screening, unspecified 2013  . Broken leg    age 61  . Cellulitis   . Crohn's disease ([place]) 2013  . Depression   . Diabetes mellitus without complication (HCC)    non insulin dependent  . Early menopause   . Edema   . GERD (gastroesophageal reflux disease)   . Hiatal hernia 2013  . Hyperlipidemia   . Hypertension   . Hypothyroidism   . 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 Obesity, unspecified 2013  . Other sign and symptom in breast 2013   Right bst US,lower outer quadrant,A single 0.3x0.4x0.6cm hypoechoic mass with slightly lobulated borders with adjacent 0.3x0.4x0.5cm mass was noted. The 1st was 5cm from nipple, 2nd at 8 cm from the nipple. The previous lesion aspirate was at 3 0'clock position. These lesions are thought to account for the mammographic abnormality. Minimal interval change on [place].   [facility])   . Rib fracture    age 66  . Schizophrenia ([place])   . TBI (traumatic brain injury) ([place])   . Thyroid disease    hypothyroid  . Vitamin D deficiency     Patient Active Problem List   Diagnosis Date Noted  . Elevated lactic acid level   . Hepatic encephalopathy ([place])   . Shock ([place])   . Hyperammonemia ([place]) 03/02/2017  . Sepsis secondary to UTI ([place]) 02/13/2017  . Hypotension 01/23/2017  . Scalp laceration   . Pressure injury of skin 10/02/2016  . 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 Acute renal failure (ARF) ([place])   . Respiratory failure ([place]) 09/27/2016  . Other sign and symptom in breast     Past Surgical History:  Procedure Laterality Date  . COLONOSCOPY WITH PROPOFOL N/A 02/16/2016   Procedure: COLONOSCOPY WITH PROPOFOL;  Surgeon: Lollie Sails, MD;  Location: [facility] ENDOSCOPY;  Service: Endoscopy;  Laterality: N/A;  . COLONOSCOPY WITH PROPOFOL N/A 02/22/2016   Procedure: COLONOSCOPY WITH PROPOFOL;  Surgeon: Lollie Sails, MD;  Location: [facility] ENDOSCOPY;  Service: Endoscopy;  Laterality: N/A;  . COLONOSCOPY WITH PROPOFOL N/A 10/14/2016   Procedure: COLONOSCOPY WITH PROPOFOL;  Surgeon: Jonathon Bellows, MD;  Location: [facility] ENDOSCOPY;  Service: Endoscopy;  Laterality: N/A;  . EYE SURGERY Left 2013   cataract surgery  . PERIPHERAL VASCULAR CATHETERIZATION N/A 10/13/2016   Procedure: Dialysis/Perma Catheter Insertion;  Surgeon: Algernon Huxley, MD;  Location: [place] CV LAB;  Service: Cardiovascular;  Laterality:  N/A;  . PERIPHERAL VASCULAR CATHETERIZATION N/A 10/18/2016   Procedure: Dialysis/Perma Catheter Removal;  Surgeon: Katha Cabal, MD;  Location: [place] CV LAB;  Service: Cardiovascular;  Laterality: N/A;  . TUMOR EXCISION Right    age of 55, tumor removed from [place]    Prior to Admission medications   Medication Sig Start Date End Date Taking? Authorizing Provider  acidophilus (RISAQUAD) CAPS capsule Take 2 capsules by mouth 3  (three) times daily.    [provider]  albuterol (PROVENTIL HFA;VENTOLIN HFA) 108 (90 Base) MCG/ACT inhaler Inhale into the lungs every 6 (six) hours as needed for wheezing or shortness of breath.    [provider]  Amino Acids-Protein Hydrolys (FEEDING SUPPLEMENT, PRO-STAT SUGAR FREE 64,) LIQD Take 30 mLs by mouth 3 (three) times daily with meals.    [provider]  bisacodyl (DULCOLAX) 5 MG EC tablet Take 5 mg by mouth daily as needed for moderate constipation.    [provider]  calcium carbonate (OSCAL) 1500 (600 Ca) MG TABS tablet Take 1,500 mg by mouth 2 (two) times daily with a meal.    [provider]  Cholecalciferol (VITAMIN D3) 5000 units CAPS Take 5,000 Units by mouth daily.    [provider]  citalopram (CELEXA) 40 MG tablet Take 40 mg by mouth daily.    [provider]  diphenhydrAMINE (BENADRYL) 25 MG tablet Take 50 mg by mouth at bedtime as needed.    [provider]  diphenoxylate-atropine (LOMOTIL) 2.5-0.025 MG tablet Take 2 tablets by mouth every 2 (two) hours as needed for diarrhea or loose stools.    [provider]  feeding supplement, ENSURE ENLIVE, (ENSURE ENLIVE) LIQD Take 237 mLs by mouth 2 (two) times daily between meals. 03/07/17   Bettey Costa, MD  furosemide (LASIX) 40 MG tablet Take 1 tablet (40 mg total) by mouth daily. 03/07/17   Bettey Costa, MD  gemfibrozil (LOPID) 600 MG tablet Take 600 mg by mouth 2 (two) times daily before a meal.    [provider]  hyoscyamine (LEVSIN, ANASPAZ) 0.125 MG tablet Take 0.125 mg by mouth every 6 (six) hours as needed.    [provider]  lactulose (CHRONULAC) 10 GM/15ML solution Take 45 mLs (30 g total) by mouth 2 (two) times daily. 03/07/17   Bettey Costa, MD  levothyroxine (SYNTHROID, LEVOTHROID) 50 MCG tablet Take 1 tablet (50 mcg total) by mouth daily before breakfast. 03/08/17   Bettey Costa, MD  LORazepam (ATIVAN) 1 MG tablet Take 1 mg  by mouth daily.    [provider]  magnesium oxide (MAGNESIUM-OXIDE) 400 (241.3 Mg) MG tablet Take 400 mg by mouth 2 (two) times daily.    [provider]  mesalamine (APRISO) 0.375 g 24 hr capsule Take 1,500 mg by mouth daily.    [provider]  midodrine (PROAMATINE) 10 MG tablet Take 10 mg by mouth 4 (four) times daily.    [provider]  Multiple Vitamin (MULTIVITAMIN WITH MINERALS) TABS tablet Take 1 tablet by mouth daily.    [provider]  omega-3 acid ethyl esters (LOVAZA) 1 g capsule Take 1 g by mouth daily.    [provider]  pantoprazole (PROTONIX) 40 MG tablet Take 40 mg by mouth daily.    [provider]  potassium chloride SA (K-DUR,KLOR-CON) 20 MEQ tablet Take 20 mEq by mouth 2 (two) times daily.    [provider]  predniSONE (DELTASONE) 10 MG tablet Take 1  tablet (10 mg total) by mouth daily with breakfast. 50 mg daily for 1 day then taper by 10 mg every day At 10 mg for one day then stop 03/07/17   Bettey Costa, MD  pseudoephedrine-guaifenesin (MUCINEX D) 60-600 MG 12 hr tablet Take 1 tablet by mouth every 12 (twelve) hours as needed for congestion.    [provider]  simvastatin (ZOCOR) 20 MG tablet Take 20 mg by mouth daily.    [provider]  sucralfate (CARAFATE) 1 g tablet Take 1 g by mouth 4 (four) times daily -  with meals and at bedtime.    [provider]  vitamin B-12 (CYANOCOBALAMIN) 1000 MCG tablet Take 1,000 mcg by mouth daily.    [provider]  vitamin C (ASCORBIC ACID) 250 MG tablet Take 500 mg by mouth daily.    [provider]    Allergies  Allergen Reactions  . Peanut Oil Other (See Comments)    Face turns red  . Risperidone And Related Cough    Family History  Problem Relation Age of Onset  . Cancer Mother 64       colon  . Cancer Paternal Aunt     Social History Social History  Substance Use Topics  . Smoking status: Never  Smoker  . Smokeless tobacco: Never Used  . Alcohol use No    Review of Systems Constitutional: Negative for fever. Eyes: Negative for visual changes. ENT: Negative for congestion Cardiovascular: Negative for chest pain. Respiratory: Negative for shortness of breath. Gastrointestinal: Negative for abdominal pain, vomiting and diarrhea. Genitourinary: Negative for dysuria. Musculoskeletal: Negative for back pain. Skin: Negative for rash. Neurological: Negative for headache All other ROS negative  ____________________________________________   PHYSICAL EXAM:  VITAL SIGNS: ED Triage Vitals [04/03/17 1138]  Enc Vitals Group     BP 106/69     Pulse Rate 97     Resp 16     Temp 98.2 F (36.8 C)     Temp Source Oral     SpO2 100 %     Weight 220 lb (99.8 kg)     Height 5\' 8"  (1.727 m)     Head Circumference      [facility]      Pain Score      Pain Loc      Pain Edu?      Excl. in [place]?     Constitutional: Somnolent but does awaken easily to voice and will answer questions and follow commands but then wants to go back to sleep. Eyes: Normal exam ENT   Head: Normocephalic and atraumatic.   Mouth/Throat: Mucous membranes are moist. Cardiovascular: Normal rate, regular rhythm. No murmur Respiratory: Normal respiratory effort without tachypnea nor retractions. Breath sounds are clear  Gastrointestinal: Soft and nontender. No distention. Musculoskeletal: Patient does have moderate tenderness to palpation of the lower extremities with 3+ lower extremity edema in the right lower extremity the patient does have a weeping area with erythema most consistent with cellulitis. Neurologic:  Normal speech and language. No gross focal neurologic deficits Skin:  Skin is warm, dry and intact.  Psychiatric: Mood and affect are normal.   ____________________________________________   INITIAL IMPRESSION / ASSESSMENT AND PLAN / ED COURSE  Pertinent labs & imaging results that were  available during my care of the patient were reviewed by me and considered in my medical decision making (see chart for details).  The patient presents to the emergency department for an abnormal lab  although the patient nor EMS is aware of what the lab is. We will attempt to contact [facility] for further information. In the meantime we will send a metabolic panel and CBC. On exam the patient does have erythema and weeping of the right lower extremity most consistent with cellulitis in addition to chronic lymphedema. We will cover with antibiotics while awaiting lab results.   We were able to contact [facility], they state the patient is no longer with them but moved into a group facility recently. We were able to contact the group facility. They state the patient just moved in with them and they are unclear what her baseline is and do not know if the somnolence is new or old for the patient. They are unsure what lab was abnormal but they state the primary care doctor who took the lab work recommended she go to the ER. Patient's lab work in the emergency department shows a low potassium of 2.7, as well as a low calcium of 7.4. Patient's white blood cell count is elevated to 16,000. Patient has erythema with weepage of the right lower extremity consistent with cellulitis. We will treat with IV vancomycin and admitted to the hospital. We will repeat electrolytes. Magnesium level is pending. ____________________________________________   FINAL CLINICAL IMPRESSION(S) / ED DIAGNOSES  Hypocalcemia Hypokalemia Right lower extremity cellulitis   Harvest Dark, MD 04/03/17 1305

## 2017-04-03 NOTE — H&P (Signed)
[name]    MR#:  [id number]  DATE OF BIRTH:  Apr 26, 1971  DATE OF ADMISSION:  04/03/2017  PRIMARY CARE PHYSICIAN: Lorelee Market, MD   REQUESTING/REFERRING PHYSICIAN: Dr. Harvest Dark  CHIEF COMPLAINT:   Chief Complaint  Patient presents with  . Abnormal Lab    HISTORY OF PRESENT ILLNESS:  [name]  is a 46 y.o. female with a known history of Schizophrenia, hypertension, hypothyroidism, depression, diabetes, bilateral lower extremity edema, traumatic brain injury, chronic hyponatremia, Crohn's disease presents to the hospital from group home secondary to altered mental status. Patient was recently in the hospital about a month ago and was discharged to [facility]. She was here at the time secondary to altered mental status from elevated ammonia. Patient was discharged from [facility] to group home where she was noted to be obtunded and so was sent to the emergency room. At the time of my exam patient is alert and seems close to her baseline. She is able to follow commands and answer simple questions and is oriented 3 though is very slow to respond. Labs indicate possible sepsis with elevated WBC and lactic acid is pending. Ammonia level is pending as well. Also significant hypokalemia. Has chronic lower extremities edema Dopplers were negative for any DVT last admission. Now has skin breakdown on the right leg and significant worsening of erythema and edema. So she is being admitted for cellulitis.  PAST MEDICAL HISTORY:   Past Medical History:  Diagnosis Date  . Abnormal mammogram, unspecified 2013   Prev. cytology,hypercellular smears without evidence of malignant cells. The cytopathologist questioned if samples truly representative. Care taken during sampling and is felt to be representative.  . Breast screening, unspecified 2013  . Broken leg    age 14  . Cellulitis     . Crohn's disease ([place]) 2013  . Depression   . Diabetes mellitus without complication (HCC)    non insulin dependent  . Early menopause   . Edema   . GERD (gastroesophageal reflux disease)   . Hiatal hernia 2013  . Hyperlipidemia   . Hypertension   . Hypothyroidism   . 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 Obesity, unspecified 2013  . Other sign and symptom in breast 2013   Right bst US,lower outer quadrant,A single 0.3x0.4x0.6cm hypoechoic mass with slightly lobulated borders with adjacent 0.3x0.4x0.5cm mass was noted. The 1st was 5cm from nipple, 2nd at 8 cm from the nipple. The previous lesion aspirate was at 3 0'clock position. These lesions are thought to account for the mammographic abnormality. Minimal interval change on [place].   [facility])   . Rib fracture    age 47  . Schizophrenia ([place])   . TBI (traumatic brain injury) ([place])   . Thyroid disease    hypothyroid  . Vitamin D deficiency     PAST SURGICAL HISTORY:   Past Surgical History:  Procedure Laterality Date  . COLONOSCOPY WITH PROPOFOL N/A 02/16/2016   Procedure: COLONOSCOPY WITH PROPOFOL;  Surgeon: Lollie Sails, MD;  Location: [facility] ENDOSCOPY;  Service: Endoscopy;  Laterality: N/A;  . COLONOSCOPY WITH PROPOFOL N/A 02/22/2016   Procedure: COLONOSCOPY WITH PROPOFOL;  Surgeon: Lollie Sails, MD;  Location: [facility] ENDOSCOPY;  Service: Endoscopy;  Laterality: N/A;  . COLONOSCOPY WITH PROPOFOL N/A 10/14/2016   Procedure: COLONOSCOPY WITH PROPOFOL;  Surgeon: Jonathon Bellows, MD;  Location: [facility] ENDOSCOPY;  Service: Endoscopy;  Laterality: N/A;  . EYE SURGERY Left 2013   cataract surgery  . PERIPHERAL VASCULAR CATHETERIZATION N/A 10/13/2016   Procedure: Dialysis/Perma Catheter Insertion;  Surgeon: Algernon Huxley, MD;  Location: [place] CV LAB;  Service: Cardiovascular;  Laterality: N/A;  . PERIPHERAL VASCULAR CATHETERIZATION N/A 10/18/2016   Procedure: Dialysis/Perma Catheter  Removal;  Surgeon: Katha Cabal, MD;  Location: [place] CV LAB;  Service: Cardiovascular;  Laterality: N/A;  . TUMOR EXCISION Right    age of 70, tumor removed from [place]:   Social History  Substance Use Topics  . Smoking status: Never Smoker  . Smokeless tobacco: Never Used  . Alcohol use No    FAMILY HISTORY:   Family History  Problem Relation Age of Onset  . Cancer Mother 68       colon  . Cancer Paternal Aunt     DRUG ALLERGIES:   Allergies  Allergen Reactions  . Peanut Oil Other (See Comments)    Face turns red  . Risperidone And Related Cough    REVIEW OF SYSTEMS:   Review of Systems  Constitutional: Negative for chills, fever and malaise/fatigue.  HENT: Negative for ear discharge, ear pain, hearing loss and nosebleeds.   Eyes: Negative for blurred vision, double vision and photophobia.  Respiratory: Negative for cough, hemoptysis, shortness of breath and wheezing.   Cardiovascular: Positive for leg swelling. Negative for chest pain, palpitations and orthopnea.  Gastrointestinal: Negative for abdominal pain, constipation, diarrhea, melena, nausea and vomiting.  Genitourinary: Negative for dysuria and urgency.  Musculoskeletal: Positive for myalgias. Negative for back pain and neck pain.  Skin: Negative for rash.  Neurological: Positive for weakness. Negative for dizziness, sensory change, speech change, focal weakness and headaches.  Endo/Heme/Allergies: Does not bruise/bleed easily.  Psychiatric/Behavioral: Negative for depression.    MEDICATIONS AT HOME:   Prior to Admission medications   Medication Sig Start Date End Date Taking? Authorizing Provider  Amino Acids-Protein Hydrolys (FEEDING SUPPLEMENT, PRO-STAT SUGAR FREE 64,) LIQD Take 30 mLs by mouth 3 (three) times daily with meals.   Yes [provider]  bisacodyl (DULCOLAX) 5 MG EC tablet Take 5 mg by mouth daily as needed for moderate constipation.   Yes [provider]  calcium carbonate (OSCAL) 1500 (600 Ca) MG TABS tablet Take 1,500 mg by mouth 2 (two) times daily with a meal.   Yes [provider]  cephALEXin (KEFLEX) 500 MG capsule Take 500 mg by mouth 2 (two) times daily.   Yes [provider]  Cholecalciferol (VITAMIN D3) 5000 units CAPS Take 5,000 Units by mouth daily.   Yes [provider]  citalopram (CELEXA) 40 MG tablet Take 40 mg by mouth daily.   Yes [provider]  diphenhydrAMINE (BENADRYL) 25 MG tablet Take 50 mg by mouth at bedtime as needed.   Yes [provider]  diphenoxylate-atropine (LOMOTIL) 2.5-0.025 MG tablet Take 2 tablets by mouth every 2 (two) hours as needed for diarrhea or loose stools.   Yes [provider]  furosemide (LASIX) 40 MG tablet Take 1 tablet (40 mg total) by mouth daily. 03/07/17  Yes Mody, Ulice Bold, MD  gemfibrozil (LOPID) 600 MG tablet Take 600 mg by mouth 2 (two) times daily before a meal.   Yes [provider]  hyoscyamine (LEVSIN, ANASPAZ) 0.125 MG tablet Take 0.125 mg by mouth every 6 (six) hours as needed.   Yes [provider]  lactulose (CHRONULAC) 10 GM/15ML solution Take 45 mLs (30 g total) by mouth 2 (two) times daily. 03/07/17  Yes Bettey Costa, MD  levothyroxine (SYNTHROID, LEVOTHROID) 50 MCG tablet Take 1 tablet (50 mcg total) by mouth daily before breakfast. 03/08/17  Yes Mody, Sital, MD  LORazepam (ATIVAN) 1 MG tablet Take 1 mg by mouth daily.   Yes [provider]  magnesium oxide (MAGNESIUM-OXIDE) 400 (241.3 Mg) MG tablet Take 400 mg by mouth 2 (two) times daily.   Yes [provider]  mesalamine (APRISO) 0.375 g 24 hr capsule Take 1,500 mg by mouth daily.   Yes [provider]  midodrine (PROAMATINE) 10 MG tablet Take 10 mg by mouth 4 (four) times daily.   Yes [provider]  Multiple Vitamin (MULTIVITAMIN WITH MINERALS) TABS tablet Take 1 tablet by mouth daily.   Yes [provider]  omega-3 acid ethyl esters (LOVAZA) 1 g capsule Take 1 g by mouth daily.   Yes [provider]  pantoprazole (PROTONIX) 40 MG tablet Take 40 mg by mouth daily.   Yes [provider]  potassium chloride (K-DUR,KLOR-CON) 10 MEQ tablet Take 10 mEq by mouth 2 (two) times daily.    Yes [provider]  pseudoephedrine-guaifenesin (MUCINEX D) 60-600 MG 12 hr tablet Take 1 tablet by mouth every 12 (twelve) hours as needed for congestion.   Yes [provider]  vitamin B-12 (CYANOCOBALAMIN) 1000 MCG tablet Take 1,000 mcg by mouth daily.   Yes [provider]  feeding supplement, ENSURE ENLIVE, (ENSURE ENLIVE) LIQD Take 237 mLs by mouth 2 (two) times daily between meals. 03/07/17   Bettey Costa, MD  predniSONE (DELTASONE) 10 MG tablet Take 1 tablet (10 mg total) by mouth daily with breakfast. 50 mg daily for 1 day then taper by 10 mg every day At 10 mg for one day then stop Patient not taking: Reported on 04/03/2017 03/07/17   Bettey Costa, MD  vitamin C (ASCORBIC ACID) 250 MG tablet Take 500 mg by mouth daily.    [provider]      VITAL SIGNS:  Blood pressure 106/69, pulse 97, temperature 98.2 F (36.8 C), temperature source Oral, resp. rate 16, height 5\' 8"  (1.727 m), weight 99.8 kg (220 lb), SpO2 100 %.  PHYSICAL EXAMINATION:   Physical Exam  GENERAL:  46 y.o.-year-old patient lying in the bed with no acute distress. Slow to respond EYES: Pupils equal, round, reactive to light and accommodation. No scleral icterus. Extraocular muscles intact.  HEENT: Head atraumatic, normocephalic. Oropharynx and nasopharynx clear.  NECK:  Supple, no jugular venous distention. No thyroid enlargement, no tenderness.  LUNGS: Normal breath sounds bilaterally, no wheezing, rales,rhonchi or crepitation. No use of accessory muscles of respiration.  CARDIOVASCULAR: S1, S2 normal. No murmurs, rubs, or gallops.  ABDOMEN: Soft, nontender, nondistended. Bowel sounds  present. No organomegaly or mass.  EXTREMITIES: 3+ bilateral lower extremity edema with skin tears on right leg noted. No  cyanosis, or clubbing.  NEUROLOGIC: Cranial nerves II through XII are intact. Muscle strength 5/5 in all extremities. Sensation intact. Gait not checked. Global weakness noted. PSYCHIATRIC: The patient is alert, slow to respond, but is oriented x 3, cannot give detailed answers follows simple commands. SKIN: No obvious rash, lesion, or ulcer.   LABORATORY PANEL:   CBC  Recent Labs Lab 04/03/17 1141  WBC 16.0*  HGB 10.9*  HCT 32.3*  PLT 643*   ------------------------------------------------------------------------------------------------------------------  Chemistries   Recent Labs Lab 04/03/17 1141  NA 136  K 2.7*  CL 103  CO2 23  GLUCOSE 88  BUN 6  CREATININE 1.08*  CALCIUM 7.4*  MG 1.8   ------------------------------------------------------------------------------------------------------------------  Cardiac Enzymes No results for input(s): TROPONINI in the last 168 hours. ------------------------------------------------------------------------------------------------------------------  RADIOLOGY:  No results found.  EKG:   Orders placed or performed during the hospital encounter of 03/02/17  . EKG 12-Lead  . EKG 12-Lead  . EKG    IMPRESSION AND PLAN:   [name]  is a 46 y.o. female with a known history of Schizophrenia, hypertension, hypothyroidism, depression, diabetes, bilateral lower extremity edema, traumatic brain injury, chronic hyponatremia, Crohn's disease presents to the hospital from group home secondary to altered mental status.  #1 altered mental status-likely metabolic encephalopathy from underlying sepsis. -Rule out hyperammonemia again. Ammonia level is pending at this time. - Urine analysis is pending to rule out infection. No focal deficits noted, CT of the head was not ordered on admission. Continue to monitor  at this time. -Patient seems to be more alert and close to baseline at this time.  #2 sepsis-secondary to right lower extremity cellulitis. -Has chronic bilateral lower extremity edema. -Wound care consult requested. Vancomycin and Unasyn for cellulitis. -Blood cultures ordered. Lactic acid is pending.  #3 hypertension-has chronic hypotension is from last admission. Continue midodrine. -Also takes Lasix for lower extremity edema which could've caused her hypokalemia.  #4 depression and anxiety/schizophrenia-continue home medications.  #5 GERD-Protonix  #6 DVT prophylaxis-Lovenox  Physical therapy consult and social worker consult requested.    All the records are reviewed and case discussed with ED provider. Management plans discussed with the patient, family and they are in agreement.  CODE STATUS: Full Code  TOTAL TIME TAKING CARE OF THIS PATIENT: 50 minutes.    [name] M.D on 04/03/2017 at 2:51 PM  Between 7am to 6pm - Pager - [phone]  After 6pm go to [email or web address] - password [email or web address]  Office  [phone]  CC: Primary care physician; Lorelee Market, MD

## 2017-04-03 NOTE — ED Notes (Signed)
Spoke with Dr Gaylyn Rong admitting doctor. Patient has a hx of low blood pressure, per Dr she is ok with this low blood pressure.

## 2017-04-03 NOTE — Progress Notes (Signed)
Pt arrived from Er via stretcher, pt able to stand/pivot transfer to bed. Pt then oob with assist to commode, voided a large amount of urine and stool, was changed into hospital gown and telebox placed, pt then was assisted with cleaning and returned to bed.pt is on room air, lungs clear bilat, Hr is regular. Abdomen is soft, bs hyperactive and audible without auscultation. piv 320 intact to rac, piv #22 intact to R fa, both sites are free of redness and swelling. Iv ns hung to finish Er order. Pt's bilat lower legs starting below her knees are edematous and have multiple small soft blisters scattered over all area. Socks leaving indentation, pt said "ow" when legs palpated to check edema. Pt arrived with coban dressing to L fa. Per pt, this was placed two days ago. This Probation officer removed coban to find abd pad stuck to a skin tear approx 8 inches long and ragged edged, yellowish drainage stuck to abd pad, pad had to soaked off arm. Dressing replaced with gauze and kling, pt notified that wound nurse would look at all skin issues tomorrow. Pt is able to answer questions. First question asked pt replies quickly to, next questions are not answered for a period of time. Pt makes little eye contact. Pt is oriented to room and call bell, is given po fluids which she is gulping down quickly. SRx2, call bell in reach.

## 2017-04-03 NOTE — ED Notes (Signed)
Notified EDP of low blood pressure. Looking for admitting doctor to notify of low blood pressure.

## 2017-04-03 NOTE — ED Notes (Signed)
[facility]. [facility] contacted and [facility] no longer resident there. [place] as a contact for her at [phone]. Ms. Primus Bravo states patient is now Resident of [facility].

## 2017-04-03 NOTE — Progress Notes (Signed)
Pharmacy Antibiotic Note  [name] is a 46 y.o. female admitted on 04/03/2017 with cellulitis.  Pharmacy has been consulted for Unasyn and vancomycin dosing.  Plan: 1. Unasyn 3 gm IV Q6H 2. Vancomycin 1 gm IV x 1 followed in approximately 6 hours (stacked dosing) by vancomycin 1 gm IV Q12H, predicted trough 14 mcg/mL. Pharmacy will continue to follow and adjust as needed to maintain trough 10 to 15 mcg/mL.   Vd 54.6 L, Ke 0.072 hr-1, T1/2 9.7 hr  Height: 5\' 8"  (172.7 cm) Weight: 220 lb (99.8 kg) IBW/kg (Calculated) : 63.9  Temp (24hrs), Avg:98.2 F (36.8 C), Min:98.2 F (36.8 C), Max:98.2 F (36.8 C)   Recent Labs Lab 04/03/17 1141  WBC 16.0*  CREATININE 1.08*    Estimated Creatinine Clearance: 81.3 mL/min (A) (by C-G formula based on SCr of 1.08 mg/dL (H)).    Allergies  Allergen Reactions  . Peanut Oil Other (See Comments)    Face turns red  . Risperidone And Related Cough   Thank you for allowing pharmacy to be a part of this patient's care.  Laural Benes, Pharm.D., BCPS Clinical Pharmacist 04/03/2017 2:45 PM

## 2017-04-03 NOTE — ED Notes (Signed)
Helped patient out of bathroom and placed her by her bed and nurse Maudie Mercury helped patient on bed

## 2017-04-03 NOTE — ED Notes (Signed)
Clif[place] seasons contacted [facility] [phone] states was called by Threasa Alpha [phone] to send patient in. This number contacted and message left to return call for information gathering.

## 2017-04-03 NOTE — Progress Notes (Signed)
Received call form the lab reporting critical lactic acid of 5.1. Dr. Tressia Miners paged and responded, notified her of pt's lactic acid and pt's current behavior. No new orders received.

## 2017-04-03 NOTE — ED Triage Notes (Addendum)
Pt sent by PCP for "low lab levels", blood work sent from facility, pt denies any pain, states hx of cancer, pt unable to states why she is here, looks down at ground, appears in no distress

## 2017-04-03 NOTE — ED Notes (Signed)
FN: [facility], pt was sent over by PCP for low lab levels. Pt dry heaving out in the lobby. Ems reports VSS.

## 2017-04-04 LAB — CBC
HCT: 26.2 % — ABNORMAL LOW (ref 35.0–47.0)
HEMOGLOBIN: 8.9 g/dL — AB (ref 12.0–16.0)
MCH: 30.3 pg (ref 26.0–34.0)
MCHC: 33.8 g/dL (ref 32.0–36.0)
MCV: 89.6 fL (ref 80.0–100.0)
Platelets: 503 10*3/uL — ABNORMAL HIGH (ref 150–440)
RBC: 2.92 MIL/uL — AB (ref 3.80–5.20)
RDW: 16.5 % — ABNORMAL HIGH (ref 11.5–14.5)
WBC: 9.9 10*3/uL (ref 3.6–11.0)

## 2017-04-04 LAB — BASIC METABOLIC PANEL
ANION GAP: 7 (ref 5–15)
BUN: <5 mg/dL — ABNORMAL LOW (ref 6–20)
CALCIUM: 7.1 mg/dL — AB (ref 8.9–10.3)
CHLORIDE: 111 mmol/L (ref 101–111)
CO2: 23 mmol/L (ref 22–32)
Creatinine, Ser: 1.03 mg/dL — ABNORMAL HIGH (ref 0.44–1.00)
GFR calc Af Amer: >60 mL/min (ref >60)
GFR calc non Af Amer: >60 mL/min (ref >60)
GLUCOSE: 79 mg/dL (ref 65–99)
Potassium: 3.2 mmol/L — ABNORMAL LOW (ref 3.5–5.1)
Sodium: 141 mmol/L (ref 135–145)

## 2017-04-04 LAB — MRSA PCR SCREENING: MRSA BY PCR: NEGATIVE

## 2017-04-04 LAB — LACTIC ACID, PLASMA: Lactic Acid, Venous: 4.2 mmol/L (ref 0.5–1.9)

## 2017-04-04 MED ORDER — MESALAMINE ER 250 MG PO CPCR
1500.0000 mg | ORAL_CAPSULE | Freq: Every day | ORAL | Status: DC
  Administered 2017-04-04: 1500 mg via ORAL
  Filled 2017-04-04 (×2): qty 6

## 2017-04-04 MED ORDER — SODIUM CHLORIDE 0.9 % IV BOLUS (SEPSIS)
1000.0000 mL | Freq: Once | INTRAVENOUS | Status: AC
  Administered 2017-04-04: 1000 mL via INTRAVENOUS

## 2017-04-04 MED ORDER — PROCHLORPERAZINE EDISYLATE 5 MG/ML IJ SOLN
10.0000 mg | Freq: Four times a day (QID) | INTRAMUSCULAR | Status: DC | PRN
  Administered 2017-04-04: 10 mg via INTRAVENOUS
  Filled 2017-04-04 (×2): qty 2

## 2017-04-04 MED ORDER — CALCIUM CARBONATE ANTACID 500 MG PO CHEW
400.0000 mg | CHEWABLE_TABLET | Freq: Two times a day (BID) | ORAL | Status: DC
  Administered 2017-04-04 – 2017-04-05 (×2): 400 mg via ORAL
  Filled 2017-04-04 (×2): qty 2

## 2017-04-04 NOTE — Progress Notes (Signed)
[facility] [place] NAME: [name]    MR#:  [id number]  DATE OF BIRTH:  01/21/1971  SUBJECTIVE:   Patient here due to sepsis secondary to cellulitis and also altered mental status and noted to have elevated ammonia level. Having some diarrhea this morning, remains confused at times. Also had some episodes of nausea vomiting but this has resolved now.  REVIEW OF SYSTEMS:    Review of Systems  Constitutional: Negative for chills and fever.  HENT: Negative for congestion and tinnitus.   Eyes: Negative for blurred vision and double vision.  Respiratory: Negative for cough, shortness of breath and wheezing.   Cardiovascular: Negative for chest pain, orthopnea and PND.  Gastrointestinal: Positive for diarrhea. Negative for abdominal pain, nausea and vomiting.  Genitourinary: Negative for dysuria and hematuria.  Neurological: Negative for dizziness, sensory change and focal weakness.  All other systems reviewed and are negative.   Nutrition: Heart healthy Tolerating Diet: Yes Tolerating PT: Await Eval.     DRUG ALLERGIES:   Allergies  Allergen Reactions  . Peanut Oil Other (See Comments)    Face turns red  . Risperidone And Related Cough    VITALS:  Blood pressure (!) 88/46, pulse 75, temperature 98.5 F (36.9 C), temperature source Oral, resp. rate 15, height 5\' 8"  (1.727 m), weight 99.8 kg (220 lb), SpO2 100 %.  PHYSICAL EXAMINATION:   Physical Exam  GENERAL:  46 y.o.-year-old patient lying in bed confused but in NAD.  EYES: Pupils equal, round, reactive to light and accommodation. No scleral icterus. Extraocular muscles intact.  HEENT: Head atraumatic, normocephalic. Oropharynx and nasopharynx clear.  NECK:  Supple, no jugular venous distention. No thyroid enlargement, no tenderness.  LUNGS: Normal breath sounds bilaterally, no wheezing, rales, rhonchi. No use of accessory muscles of respiration.  CARDIOVASCULAR: S1, S2  normal. No murmurs, rubs, or gallops.  ABDOMEN: Soft, nontender, nondistended. Bowel sounds present. No organomegaly or mass.  EXTREMITIES: No cyanosis, clubbing, + 2 edema b/l, signs of chronic venous stasis bilaterally.    NEUROLOGIC: Cranial nerves II through XII are intact. No focal Motor or sensory deficits b/l. Globally weak  PSYCHIATRIC: The patient is alert and oriented x 1.  SKIN: No obvious rash, lesion, or ulcer.    LABORATORY PANEL:   CBC  Recent Labs Lab 04/04/17 0510  WBC 9.9  HGB 8.9*  HCT 26.2*  PLT 503*   ------------------------------------------------------------------------------------------------------------------  Chemistries   Recent Labs Lab 04/03/17 1141 04/03/17 1153 04/04/17 0510  NA 136  --  141  K 2.7*  --  3.2*  CL 103  --  111  CO2 23  --  23  GLUCOSE 88  --  79  BUN 6  --  <5*  CREATININE 1.08*  --  1.03*  CALCIUM 7.4*  --  7.1*  MG 1.8  --   --   AST  --  49*  --   ALT  --  22  --   ALKPHOS  --  126  --   BILITOT  --  0.7  --    ------------------------------------------------------------------------------------------------------------------  Cardiac Enzymes No results for input(s): TROPONINI in the last 168 hours. ------------------------------------------------------------------------------------------------------------------  RADIOLOGY:  No results found.   ASSESSMENT AND PLAN:   [name]  is a 46 y.o. female with a known history of Schizophrenia, hypertension, hypothyroidism, depression, diabetes, bilateral lower extremity edema, traumatic brain injury, chronic hyponatremia, Crohn's disease presents to the hospital from group home  secondary to altered mental status.  #1 altered mental status-likely metabolic encephalopathy from underlying sepsis with also Hepatic Encephalopathy as ammonia level is elevated.  - CT head (-).  Will cont Lactulose and follow mental status.  - cont. IV abx for cellulitis and narrow abx  once MRSA PCR is back.  - UA still pending as she is incontinent.    #2 sepsis-secondary to right lower extremity cellulitis.  -Has chronic bilateral lower extremity edema due to chronic venous stasis and cont. Care as per Wound team.  - currently on Vancomycin and Unasyn for cellulitis and will narrow abx once MRSA PCR Is back.  - Blood cultures (-) so far and Lactate is elevated.    #3 hypertension-has chronic hypotension is from last admission. Continue midodrine.  #4 depression and anxiety/schizophrenia-continue Celexa  #5 GERD-Protonix  #6 Hypothyroidism - cont. Synthroid.   #7 hx of Crohn's disease - currently having diarrhea.   - cont. Mesalamine.   #6 Hypokalemia - improving with supplementation and will cont. To monitor.    All the records are reviewed and case discussed with Care Management/Social Worker. Management plans discussed with the patient, family and they are in agreement.  CODE STATUS: Full code  DVT Prophylaxis: Lovenox  TOTAL TIME TAKING CARE OF THIS PATIENT: 30 minutes.   POSSIBLE D/C IN 1-2 DAYS, DEPENDING ON CLINICAL CONDITION.   Henreitta Leber M.D on 04/04/2017 at 1:39 PM  Between 7am to 6pm - Pager - [phone]  After 6pm go to [email or web address] - Proofreader  [facility]  Office  [phone]  CC: Primary care physician; Lorelee Market, MD

## 2017-04-04 NOTE — Progress Notes (Signed)
Transferred to room 142 with belongings. Transferred on bed with IV infusing.

## 2017-04-04 NOTE — Progress Notes (Signed)
Called report to [employer] on 1-A.

## 2017-04-04 NOTE — Progress Notes (Signed)
Patient still having episodes of nausea and vomiting.  Zofran given, continue to monitor.

## 2017-04-04 NOTE — Clinical Social Work Note (Addendum)
CSW spoke to Hosie Spangle [phone], Med tech at patient's group home.  Patient is from [facility] group home.  Group home is requesting FL2 and Discharge summary to be faxed to them at [phone] once patient is medically ready for discharge.  [name]. Palacios, MSW, [place]  04/04/2017 12:02 PM

## 2017-04-04 NOTE — Care Management (Signed)
Patient admitted from family care home environment due to altered mental status thought to be from sepsis related to cellulitis.  Informed that she is followed by [facility] nurse and physical therapy. Wound care consult.  Patient with 5 admissions within last 6 months.  Last discharge to skilled nursing 5/8. Unsure how long she was in the family care home before readmitted.   Reached out to [facility] regarding assessments and care in the facility and if any issues managing care could contribute to readmission

## 2017-04-04 NOTE — Progress Notes (Signed)
Patient's BP running very soft.  Spoke to Dr. Verdell Carmine and he states as long as patient is alert and MAP is ok, no other interventions at this time.

## 2017-04-04 NOTE — Evaluation (Signed)
Physical Therapy Evaluation Patient Details Name: [name] MRN: [id number] DOB: November 23, 1970 Today's Date: 04/04/2017   History of Present Illness  Pt is a 46 y.o. female admitted to ED yesterday (04/03/2017) due to abnormal lab results (hypokalemia and high WBC's). Pt was unaware why she was admitted. Follow up testing revealed sepsis and she was admitted for cellulitis. Pt's B LE edema and erythema has worsened since her last hospital admission roughly 1 month ago. PMH includes: schizophrenia, DM, B LE edema, TBI, chrone's disease, depression, and hypothyroid.    Clinical Impression  Pt is a pleasant 46 year old female who was admitted for sepsis and cellulitis in her B LE. Pt is a poor historian who is slow to respond to questions and is often unable to provide any and/or complete answers. However, pt can respond well to verbal commands. Pt presents with generalized weakness and poor skin integrity in her B LE's with blistering, significant edema, and discoloration noted. PT recommends ted hose to address these skin integrity concerns. Pt performs bed mobility with min assist and transfers/ambulates with min guard. Pt ambulated 224ft this session with RW and chair follow for safety. Would benefit from skilled PT to address above deficits and ensure continued return to PLOF. PT recommends dc to group home due to pt's current functional mobility status.     Follow Up Recommendations  (DC back to group home)    Equipment Recommendations  None recommended by PT (Pt has RW and W/C)    Recommendations for Other Services       Precautions / Restrictions Precautions Precautions: Fall Restrictions Weight Bearing Restrictions: No      Mobility  Bed Mobility Overal bed mobility: Needs Assistance Bed Mobility: Supine to Sit     Supine to sit: Supervision     General bed mobility comments: Pt required verbal cueing for proper sequencing and upright posture once EOB.  Transfers Overall  transfer level: Needs assistance Equipment used: Rolling walker (2 wheeled) Transfers: Sit to/from Stand Sit to Stand: Supervision         General transfer comment: PT provided verbal cueing for proper sequencing and to maintain pt safety.  Ambulation/Gait Ambulation/Gait assistance: Min guard Ambulation Distance (Feet): 225 Feet Assistive device: Rolling walker (2 wheeled) Gait Pattern/deviations: Step-through pattern;Wide base of support;Trunk flexed     General Gait Details: Pt ambulated with chair follow for safety. Pt required 1 standing break and 1 seated break while ambulating 247ft. Pt able to maintain balance while drinking from fountain and adjusting clothes with both hands. Pt able to follow verbal commands for upright posture, turns, and sit to/from stand.  Stairs            Wheelchair Mobility    Modified Rankin (Stroke Patients Only)       Balance Overall balance assessment: No apparent balance deficits (not formally assessed);History of Falls (No LOB noted during today's session.)                                           Pertinent Vitals/Pain Pain Assessment: No/denies pain Pain Location: B LE's Pain Descriptors / Indicators:  (Pt stated "ow" when PT adjusted her socks. ) Pain Intervention(s): Repositioned (elevated feet and pulled socks up.)    Home Living Family/patient expects to be discharged to:: Group home  Additional Comments: Pt unable to give more information about this group home.    Prior Function Level of Independence: Needs assistance   Gait / Transfers Assistance Needed: Pt states she owns a RW and uses this for all mobility. Pt also owns a w/c, which she self-propels at the group home.            Hand Dominance        Extremity/Trunk Assessment   Upper Extremity Assessment Upper Extremity Assessment: Generalized weakness (B grossly 4/5 (shoulder flexion, biceps, triceps, and  grip))    Lower Extremity Assessment Lower Extremity Assessment: Generalized weakness (Grossly 4/5 for knee flexion/extension, PF/DF. )       Communication   Communication: Other (comment) (Pt slow to answer questions. Pt can follow commands.)  Cognition Arousal/Alertness: Awake/alert Behavior During Therapy: Flat affect (Pt able to follow single step commands. ) Overall Cognitive Status: History of cognitive impairments - at baseline                                 General Comments: Pt responded to short and direct verbal commands with gesturing. Pt's responses were short and slow.       General Comments General comments (skin integrity, edema, etc.): Pt had significant B LE edema with blistering and discoloration present. Pt's socks adjusted from ankles to mid-calf height to prevent blood flow restriction at ankle and to assist edema reduction while feet elevated in recliner.    Exercises Other Exercises Other Exercises: Ther-ex performed in recliner including: LAQ x10 B, seated marches x10 B, ankle pumps x15, and shoulder flexion B x10. (Pt performed with good speed, cues for proper sequencing.)   Assessment/Plan    PT Assessment Patient needs continued PT services  PT Problem List Decreased strength;Decreased activity tolerance;Decreased cognition;Decreased skin integrity       PT Treatment Interventions Gait training;Therapeutic exercise    PT Goals (Current goals can be found in the Care Plan section)  Acute Rehab PT Goals Patient Stated Goal: to get back to group home PT Goal Formulation: With patient Time For Goal Achievement: 04/18/17 Potential to Achieve Goals: Good Additional Goals Additional Goal #1: Will perform bed mobility with no more than min guard to enable her to return to PLOF.    Frequency Min 2X/week   Barriers to discharge        Co-evaluation               AM-PAC PT "6 Clicks" Daily Activity  Outcome Measure Difficulty  turning over in bed (including adjusting bedclothes, sheets and blankets)?: A Little Difficulty moving from lying on back to sitting on the side of the bed? : A Little Difficulty sitting down on and standing up from a chair with arms (e.g., wheelchair, bedside commode, etc,.)?: A Little Help needed moving to and from a bed to chair (including a wheelchair)?: A Little Help needed walking in hospital room?: A Little Help needed climbing 3-5 steps with a railing? : A Lot 6 Click Score: 17    End of Session Equipment Utilized During Treatment: Gait belt Activity Tolerance: Patient tolerated treatment well Patient left: with chair alarm set;in chair;with call bell/phone within reach   PT Visit Diagnosis: History of falling (Z91.81);Muscle weakness (generalized) (M62.81);Other (comment) (B LE edema/cellulitis)    Time: 3825-0539 PT Time Calculation (min) (ACUTE ONLY): 28 min   Charges:   PT Evaluation $PT Eval Moderate Complexity: 1  Procedure PT Treatments $Therapeutic Exercise: 8-22 mins   PT G Codes:        Donaciano Eva, PT, SPT  Marni Griffon 04/04/2017, 1:32 PM

## 2017-04-04 NOTE — Care Management (Signed)
Patient discharged from [facility] 5/24. [facility] made initial visit 5/31.  patient was to have legs wrapped with coban twice weekly. [facility] liaison reports home health nurse saw once.  Legs are to be wrapped twice weekly.  When home health physical therapist saw patient the next day, her coban wraps had been removed.  [facility] staff made relayed that the facility was unclean and bathroom had feces on the floor.  Questioned whether agency made a report to DSS about finding and this is not know.  CM updated CSW of agency concerns.  Do not know if this is a mental health home vs family care home.

## 2017-04-04 NOTE — Consult Note (Signed)
[place] Nurse wound consult note Reason for Consult:Patient wears Unnas boots for venous insufficiency and lower extremity edema.  Has blistering to anterior and posterior lower legs.  Right lateral lower leg with 2 cm x 2 cm x 0.1 cm ruptured serum blister.   Wound type: Venous insufficiency with cellulitis Pressure Injury POA: N/A Wound XQJ:JHER and moist Drainage (amount, consistency, odor) Scant serous  No odor Periwound:Erythema and edema Dressing procedure/placement/frequency:Cleanse bilateral lower legs with soap and water  Apply zinc layer from below toes to below knee.  Secure with self adherent Coban.  Change weekly.  Will not follow at this time.  Please re-consult if needed.  Domenic Moras RN BSN [place] Pager [phone]

## 2017-04-04 NOTE — Progress Notes (Signed)
Notified Dr. Jannifer Franklin of blood pressure of 79/41 and 83/53. NS bolus ordered and repeat of lactic acid ordered.

## 2017-04-04 NOTE — Progress Notes (Signed)
Notified Dr. Marcille Blanco of patient's continued nausea and vomiting despite receiving multiple doses of zofran. Compazine ordered.

## 2017-04-05 LAB — BASIC METABOLIC PANEL
ANION GAP: 6 (ref 5–15)
BUN: <5 mg/dL — ABNORMAL LOW (ref 6–20)
CO2: 22 mmol/L (ref 22–32)
Calcium: 7.1 mg/dL — ABNORMAL LOW (ref 8.9–10.3)
Chloride: 108 mmol/L (ref 101–111)
Creatinine, Ser: 0.95 mg/dL (ref 0.44–1.00)
GFR calc non Af Amer: >60 mL/min (ref >60)
Glucose, Bld: 72 mg/dL (ref 65–99)
POTASSIUM: 3.2 mmol/L — AB (ref 3.5–5.1)
Sodium: 136 mmol/L (ref 135–145)

## 2017-04-05 LAB — CBC
HCT: 27.6 % — ABNORMAL LOW (ref 35.0–47.0)
HEMOGLOBIN: 9.3 g/dL — AB (ref 12.0–16.0)
MCH: 30.8 pg (ref 26.0–34.0)
MCHC: 33.7 g/dL (ref 32.0–36.0)
MCV: 91.5 fL (ref 80.0–100.0)
Platelets: 510 10*3/uL — ABNORMAL HIGH (ref 150–440)
RBC: 3.02 MIL/uL — AB (ref 3.80–5.20)
RDW: 16.6 % — ABNORMAL HIGH (ref 11.5–14.5)
WBC: 10.9 10*3/uL (ref 3.6–11.0)

## 2017-04-05 LAB — LACTIC ACID, PLASMA: Lactic Acid, Venous: 3.3 mmol/L (ref 0.5–1.9)

## 2017-04-05 LAB — AMMONIA: AMMONIA: 20 umol/L (ref 9–35)

## 2017-04-05 LAB — VANCOMYCIN, TROUGH: VANCOMYCIN TR: 26 ug/mL — AB (ref 15–20)

## 2017-04-05 MED ORDER — VANCOMYCIN HCL 10 G IV SOLR
1250.0000 mg | INTRAVENOUS | Status: DC
  Filled 2017-04-05: qty 1250

## 2017-04-05 MED ORDER — DOXYCYCLINE HYCLATE 100 MG PO CAPS: 100.0000 mg | ORAL_CAPSULE | Freq: Two times a day (BID) | ORAL | 0 refills | Status: AC

## 2017-04-05 MED ORDER — SODIUM CHLORIDE 0.9 % IV BOLUS (SEPSIS)
500.0000 mL | Freq: Once | INTRAVENOUS | Status: AC
  Administered 2017-04-05: 500 mL via INTRAVENOUS

## 2017-04-05 NOTE — Care Management Important Message (Signed)
Important Message  Patient Details  Name: ROSALINA DINGWALL MRN: [id number] Date of Birth: 1971-09-28   Medicare Important Message Given:  N/A - LOS <3 / Initial given by admissions    Jolly Mango, RN 04/05/2017, 10:05 AM

## 2017-04-05 NOTE — NC FL2 (Signed)
[place] LEVEL OF CARE SCREENING TOOL     IDENTIFICATION  Patient Name: [name] Birthdate: 05-20-1971 Sex: female Admission Date (Current Location): 04/03/2017  [place] and [place] Number:  [name] [id number] [place] and Address:  [facility], [address] [place], [place] [address]      Provider Number: [id number]  Attending Physician Name and Address:  Henreitta Leber, MD  Relative Name and Phone Number:      Current Level of Care: Hospital Recommended Level of Care: Other (Comment) ([place] ) Prior Approval Number:    Date Approved/Denied:   PASRR Number:   Discharge Plan: Domiciliary (Rest home) ([place] )    Current Diagnoses: Patient Active Problem List   Diagnosis Date Noted  . Sepsis ([place]) 04/03/2017  . Elevated lactic acid level   . Hepatic encephalopathy ([place])   . Shock ([place])   . Hyperammonemia ([place]) 03/02/2017  . Sepsis secondary to UTI ([place]) 02/13/2017  . Hypotension 01/23/2017  . Scalp laceration   . Pressure injury of skin 10/02/2016  . 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 Acute renal failure (ARF) ([place])   . Respiratory failure ([place]) 09/27/2016  . Other sign and symptom in breast     Orientation RESPIRATION BLADDER Height & Weight     Self, Time, Situation, Place  Normal Continent Weight: 223 lb 12.8 oz (101.5 kg) Height:  5\' 6"  (167.6 cm)  BEHAVIORAL SYMPTOMS/MOOD NEUROLOGICAL BOWEL NUTRITION STATUS   (none)  (none) Continent Diet (Regular Diet )  AMBULATORY STATUS COMMUNICATION OF NEEDS Skin   Supervision Verbally Normal                       Personal Care Assistance Level of Assistance  Bathing, Feeding, Dressing Bathing Assistance: Limited assistance Feeding assistance: Independent Dressing Assistance: Limited assistance     Functional Limitations Info  Sight, Hearing, Speech Sight Info: Adequate Hearing Info: Adequate Speech  Info: Adequate    SPECIAL CARE FACTORS FREQUENCY                    Contractures Contractures Info: Not present    Additional Factors Info  Code Status, Allergies Code Status Info:  (Full Code. ) Allergies Info:  (Peanut Oil, Risperidone And Related) Psychotropic Info: citalopram (CELEXA) tablet 40 mg and LORazepam (ATIVAN) tablet 1 mg        Discharge Medications: Please see discharge summary for a list of discharge medications. TAKE these medications   bisacodyl 5 MG EC tablet Commonly known as:  DULCOLAX Take 5 mg by mouth daily as needed for moderate constipation.   calcium carbonate 1500 (600 Ca) MG Tabs tablet Commonly known as:  OSCAL Take 1,500 mg by mouth 2 (two) times daily with a meal.   citalopram 40 MG tablet Commonly known as:  CELEXA Take 40 mg by mouth daily.   diphenhydrAMINE 25 MG tablet Commonly known as:  BENADRYL Take 50 mg by mouth at bedtime as needed.   diphenoxylate-atropine 2.5-0.025 MG tablet Commonly known as:  LOMOTIL Take 2 tablets by mouth every 2 (two) hours as needed for diarrhea or loose stools.   doxycycline 100 MG capsule Commonly known as:  VIBRAMYCIN Take 1 capsule (100 mg total) by mouth 2 (two) times daily.   feeding supplement (ENSURE ENLIVE) Liqd Take 237 mLs by mouth 2 (two) times daily between meals.   feeding supplement (PRO-STAT SUGAR FREE 64) Liqd Take 30  mLs by mouth 3 (three) times daily with meals.   furosemide 40 MG tablet Commonly known as:  LASIX Take 1 tablet (40 mg total) by mouth daily.   gemfibrozil 600 MG tablet Commonly known as:  LOPID Take 600 mg by mouth 2 (two) times daily before a meal.   hyoscyamine 0.125 MG tablet Commonly known as:  LEVSIN, ANASPAZ Take 0.125 mg by mouth every 6 (six) hours as needed.   lactulose 10 GM/15ML solution Commonly known as:  CHRONULAC Take 45 mLs (30 g total) by mouth 2 (two) times daily.   levothyroxine 50 MCG tablet Commonly known as:   SYNTHROID, LEVOTHROID Take 1 tablet (50 mcg total) by mouth daily before breakfast.   LORazepam 1 MG tablet Commonly known as:  ATIVAN Take 1 mg by mouth daily.   MAGNESIUM-OXIDE 400 (241.3 Mg) MG tablet Generic drug:  magnesium oxide Take 400 mg by mouth 2 (two) times daily.   mesalamine 0.375 g 24 hr capsule Commonly known as:  APRISO Take 1,500 mg by mouth daily.   midodrine 10 MG tablet Commonly known as:  PROAMATINE Take 10 mg by mouth 4 (four) times daily.   multivitamin with minerals Tabs tablet Take 1 tablet by mouth daily.   omega-3 acid ethyl esters 1 g capsule Commonly known as:  LOVAZA Take 1 g by mouth daily.   pantoprazole 40 MG tablet Commonly known as:  PROTONIX Take 40 mg by mouth daily.   potassium chloride 10 MEQ tablet Commonly known as:  K-DUR,KLOR-CON Take 10 mEq by mouth 2 (two) times daily.   pseudoephedrine-guaifenesin 60-600 MG 12 hr tablet Commonly known as:  MUCINEX D Take 1 tablet by mouth every 12 (twelve) hours as needed for congestion.   vitamin B-12 1000 MCG tablet Commonly known as:  CYANOCOBALAMIN Take 1,000 mcg by mouth daily.   vitamin C 250 MG tablet Commonly known as:  ASCORBIC ACID Take 500 mg by mouth daily.   Vitamin D3 5000 units Caps Take 5,000 Units by mouth daily.  Relevant Imaging Results: Relevant Lab Results: Additional Information  (SSN: [id number])  Athalene Kolle, Veronia Beets, LCSW

## 2017-04-05 NOTE — Care Management Note (Signed)
Case Management Note  Patient Details  Name: [name] MRN: [id number] Date of Birth: Sep 12, 1971  Subjective/Objective:  [facility] notified of discharge                 Action/Plan:   Expected Discharge Date:  04/05/17               Expected Discharge Plan:  [place]  In-House Referral:     Discharge planning Services  CM Consult  Post Acute Care Choice:  Resumption of Svcs/PTA Provider, Home Health Choice offered to:     DME Arranged:    DME Agency:     HH Arranged:  RN, PT [place] Agency:  [facility]  Status of Service:  Completed, signed off  If discussed at [place] of Stay Meetings, dates discussed:    Additional Comments:  Jolly Mango, RN 04/05/2017, 11:09 AM

## 2017-04-05 NOTE — Clinical Social Work Note (Signed)
Clinical Social Work Assessment  Patient Details  Name: [name] MRN: [id number] Date of Birth: [birth year]  Date of referral:  04/05/17               Reason for consult:  Other (Comment Required) (From [facility] )                Permission sought to share information with:  Facility Art therapist granted to share information::  Yes, Verbal Permission Granted  Name::        Agency::     Relationship::     Contact Information:     Housing/Transportation Living arrangements for the past 2 months:  [place] of Information:  Patient, Power of Forensic psychologist, Facility Patient Interpreter Needed:  None Criminal Activity/Legal Involvement Pertinent to Current Situation/Hospitalization:  No - Comment as needed Significant Relationships:  Other Family Members Lives with:  Facility Resident Do you feel safe going back to the place where you live?  Yes Need for family participation in patient care:  Yes (Comment)  Care giving concerns:  Patient is a resident at [facility] group home located at [address] [place], [place] [address], phone [phone] fax [phone].     Social Worker assessment / plan:  Holiday representative (CSW) received verbal consult from MD that patient is from a group home and is stable for D/C back to the group home today. PT is recommending patient return back to group home. CSW contacted [facility] group home and spoke to med tech Hosie Spangle, cell # [phone]. Per Levada Dy patient is a resident at [facility] and can return to the group home when stable. Per [address] is owned by [employer]. CSW made Levada Dy aware that patient is stable for D/C today and sent D/C orders to [place]. Per Levada Dy she will send a taxi to pick patient up at [facility]. Taxi driver will call 1A at [phone] when taxi arrives at visitors entrance. CSW contacted patient's HPOA Louise. Per Barbaraann Share she is patient's  aunt and lives in [place]. Barbaraann Share reported that she is only patient's HPOA and not her guardian. Barbaraann Share reported that patient was living at [facility] group home and then moved to [address]. Per Barbaraann Share patient was recently at [facility] for short term rehab and was discharged back to [facility] last week. Barbaraann Share is agreeable for patient to return to [facility] today. Patient is aware of above and agreeable of D/C plan. Please reconsult if future social work needs arise. CSW signing off.   Employment status:  Disabled (Comment on whether or not currently receiving Disability) Insurance information:  Medicare, Medicaid In [place] PT Recommendations:   (PT recommened patient be discharged back to her group home. ) Information / Referral to community resources:  Other (Comment Required) (Patient will return back to her group home. )  Patient/Family's Response to care:  Patient is agreeable to return to group home today.   Patient/Family's Understanding of and Emotional Response to Diagnosis, Current Treatment, and Prognosis:  Patient was very pleasant and thanked CSW for assistance.   Emotional Assessment Appearance:  Appears stated age Attitude/Demeanor/Rapport:    Affect (typically observed):  Accepting, Adaptable, Pleasant Orientation:  Oriented to Self, Oriented to Place, Oriented to  Time, Oriented to Situation Alcohol / Substance use:  Not Applicable Psych involvement (Current and /or in the community):  No (Comment)  Discharge Needs  Concerns to be  addressed:  Discharge Planning Concerns Readmission within the last 30 days:  Yes Current discharge risk:  Chronically ill, Cognitively Impaired Barriers to Discharge:  No Barriers Identified   Kashlyn Salinas, Veronia Beets, LCSW 04/05/2017, 10:31 AM

## 2017-04-05 NOTE — Progress Notes (Signed)
Pts BP 74/34. Scheduled 10 mg Midodrine given. MD [facility] paged and notified.Orders received for 500 ml NS fluid bolus.

## 2017-04-05 NOTE — Discharge Summary (Signed)
[place] [facility] [place] NAME: [name]    MR#:  [id number]  DATE OF BIRTH:  10/05/1971  DATE OF ADMISSION:  04/03/2017 ADMITTING PHYSICIAN: Kiara Lighter, MD  DATE OF DISCHARGE: 04/05/2017  PRIMARY CARE PHYSICIAN: Kiara Market, MD    ADMISSION DIAGNOSIS:  Weakness [R53.1] Cellulitis of right lower extremity [L03.115]  DISCHARGE DIAGNOSIS:  Active Problems:   Sepsis ([place])   SECONDARY DIAGNOSIS:   Past Medical History:  Diagnosis Date  . Abnormal mammogram, unspecified 2013   Prev. cytology,hypercellular smears without evidence of malignant cells. The cytopathologist questioned if samples truly representative. Care taken during sampling and is felt to be representative.  . Breast screening, unspecified 2013  . Broken leg    age 46  . Cellulitis   . Crohn's disease ([place]) 2013  . Depression   . Diabetes mellitus without complication (HCC)    non insulin dependent  . Early menopause   . Edema   . GERD (gastroesophageal reflux disease)   . Hiatal hernia 2013  . Hyperlipidemia   . Hypertension   . Hypotension, chronic   . Hypothyroidism   . 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 Obesity, unspecified 2013  . Other sign and symptom in breast 2013   Right bst US,lower outer quadrant,A single 0.3x0.4x0.6cm hypoechoic mass with slightly lobulated borders with adjacent 0.3x0.4x0.5cm mass was noted. The 1st was 5cm from nipple, 2nd at 8 cm from the nipple. The previous lesion aspirate was at 3 0'clock position. These lesions are thought to account for the mammographic abnormality. Minimal interval change on [place].   [place] Recurrent cellulitis of lower leg   . Regional enteritis [facility])   . Rib fracture    age 46  . Schizophrenia ([place])   . TBI (traumatic brain injury) ([place])   . Thyroid disease    hypothyroid  . Vitamin D deficiency     HOSPITAL COURSE:   [name] a 46 y.o. femalewith a known  history of Schizophrenia, hypertension, hypothyroidism, depression, diabetes, bilateral lower extremity edema, traumatic brain injury, chronic hyponatremia, Crohn's disease presents to the hospital from group home secondary to altered mental status.  #1 altered mental status-likely metabolic encephalopathy from underlying suspected sepsis  And also Hepatic Encephalopathy as ammonia level is elevated.  - CT head (-).  Ammonia level has improved with lactulose and mental status back to baseline now.  - was on broad spectrum abx for Sepsis but now narrowed down to just doxycyline for 5 more days.   #2 sepsis-secondary to right lower extremity cellulitis.  -Has chronic bilateral lower extremity edema due to chronic venous stasis and seen by wound team while in the hospital and UNNA boots have been placed now.   - was on IV Vanco, Unasyn but MRSA PCR is (-) and off Vanco now and being discharged on Oral Doxy for 5 more days.   - Blood cultures (-) so far and Lactate level has normalized.     #3 hypotension-has chronic hypotension. Continue midodrine.  #4 depression and anxiety/schizophrenia-continue Celexa, Ativan.   #5 GERD- she will Protonix  #6 Hypothyroidism - she will cont. Synthroid.   #7 hx of Crohn's disease -  She will cont. Mesalamine.   #8 Hypokalemia - improved and resolved with supplementation and pt. Will cont. Her potassium supplements.   DISCHARGE CONDITIONS:   Stable.   CONSULTS OBTAINED:    DRUG ALLERGIES:   Allergies  Allergen Reactions  . Peanut Oil Other (See Comments)    Face turns red  . Risperidone And Related Cough    DISCHARGE MEDICATIONS:   Allergies as of 04/05/2017      Reactions   Peanut Oil Other (See Comments)   Face turns red   Risperidone And Related Cough      Medication List    STOP taking these medications   cephALEXin 500 MG capsule Commonly known as:  KEFLEX   predniSONE 10 MG tablet Commonly known as:  DELTASONE      TAKE these medications   bisacodyl 5 MG EC tablet Commonly known as:  DULCOLAX Take 5 mg by mouth daily as needed for moderate constipation.   calcium carbonate 1500 (600 Ca) MG Tabs tablet Commonly known as:  OSCAL Take 1,500 mg by mouth 2 (two) times daily with a meal.   citalopram 40 MG tablet Commonly known as:  CELEXA Take 40 mg by mouth daily.   diphenhydrAMINE 25 MG tablet Commonly known as:  BENADRYL Take 50 mg by mouth at bedtime as needed.   diphenoxylate-atropine 2.5-0.025 MG tablet Commonly known as:  LOMOTIL Take 2 tablets by mouth every 2 (two) hours as needed for diarrhea or loose stools.   doxycycline 100 MG capsule Commonly known as:  VIBRAMYCIN Take 1 capsule (100 mg total) by mouth 2 (two) times daily.   feeding supplement (ENSURE ENLIVE) Liqd Take 237 mLs by mouth 2 (two) times daily between meals.   feeding supplement (PRO-STAT SUGAR FREE 64) Liqd Take 30 mLs by mouth 3 (three) times daily with meals.   furosemide 40 MG tablet Commonly known as:  LASIX Take 1 tablet (40 mg total) by mouth daily.   gemfibrozil 600 MG tablet Commonly known as:  LOPID Take 600 mg by mouth 2 (two) times daily before a meal.   hyoscyamine 0.125 MG tablet Commonly known as:  LEVSIN, ANASPAZ Take 0.125 mg by mouth every 6 (six) hours as needed.   lactulose 10 GM/15ML solution Commonly known as:  CHRONULAC Take 45 mLs (30 g total) by mouth 2 (two) times daily.   levothyroxine 50 MCG tablet Commonly known as:  SYNTHROID, LEVOTHROID Take 1 tablet (50 mcg total) by mouth daily before breakfast.   LORazepam 1 MG tablet Commonly known as:  ATIVAN Take 1 mg by mouth daily.   MAGNESIUM-OXIDE 400 (241.3 Mg) MG tablet Generic drug:  magnesium oxide Take 400 mg by mouth 2 (two) times daily.   mesalamine 0.375 g 24 hr capsule Commonly known as:  APRISO Take 1,500 mg by mouth daily.   midodrine 10 MG tablet Commonly known as:  PROAMATINE Take 10 mg by mouth 4 (four)  times daily.   multivitamin with minerals Tabs tablet Take 1 tablet by mouth daily.   omega-3 acid ethyl esters 1 g capsule Commonly known as:  LOVAZA Take 1 g by mouth daily.   pantoprazole 40 MG tablet Commonly known as:  PROTONIX Take 40 mg by mouth daily.   potassium chloride 10 MEQ tablet Commonly known as:  K-DUR,KLOR-CON Take 10 mEq by mouth 2 (two) times daily.   pseudoephedrine-guaifenesin 60-600 MG 12 hr tablet Commonly known as:  MUCINEX D Take 1 tablet by mouth every 12 (twelve) hours as needed for congestion.   vitamin B-12 1000 MCG tablet Commonly known as:  CYANOCOBALAMIN Take 1,000 mcg by mouth daily.   vitamin C 250 MG tablet Commonly known as:  ASCORBIC ACID Take 500 mg by mouth  daily.   Vitamin D3 5000 units Caps Take 5,000 Units by mouth daily.         DISCHARGE INSTRUCTIONS:   DIET:  Regular diet  DISCHARGE CONDITION:  Stable  ACTIVITY:  Activity as tolerated  OXYGEN:  Home Oxygen: No.   Oxygen Delivery: room air  DISCHARGE LOCATION:  group home   If you experience worsening of your admission symptoms, develop shortness of breath, life threatening emergency, suicidal or homicidal thoughts you must seek medical attention immediately by calling 911 or calling your MD immediately  if symptoms less severe.  You Must read complete instructions/literature along with all the possible adverse reactions/side effects for all the Medicines you take and that have been prescribed to you. Take any new Medicines after you have completely understood and accpet all the possible adverse reactions/side effects.   Please note  You were cared for by a hospitalist during your hospital stay. If you have any questions about your discharge medications or the care you received while you were in the hospital after you are discharged, you can call the unit and asked to speak with the hospitalist on call if the hospitalist that took care of you is not available.  Once you are discharged, your primary care physician will handle any further medical issues. Please note that NO REFILLS for any discharge medications will be authorized once you are discharged, as it is imperative that you return to your primary care physician (or establish a relationship with a primary care physician if you do not have one) for your aftercare needs so that they can reassess your need for medications and monitor your lab values.     Today   No N/V or abdominal pain.  Still having loose stools.  Hypotensive but chronic and asymptomatic.   VITAL SIGNS:  Blood pressure (!) 88/45, pulse 74, temperature 99.1 F (37.3 C), temperature source Oral, resp. rate 18, height 5\' 6"  (1.676 m), weight 101.5 kg (223 lb 12.8 oz), SpO2 98 %.  I/O:   Intake/Output Summary (Last 24 hours) at 04/05/17 0940 Last data filed at 04/04/17 1836  Gross per 24 hour  Intake             2345 ml  Output                0 ml  Net             2345 ml    PHYSICAL EXAMINATION:   GENERAL:  46 y.o.-year-old patient lying in bed confused but in NAD.  EYES: Pupils equal, round, reactive to light and accommodation. No scleral icterus. Extraocular muscles intact.  HEENT: Head atraumatic, normocephalic. Oropharynx and nasopharynx clear.  NECK:  Supple, no jugular venous distention. No thyroid enlargement, no tenderness.  LUNGS: Normal breath sounds bilaterally, no wheezing, rales, rhonchi. No use of accessory muscles of respiration.  CARDIOVASCULAR: S1, S2 normal. No murmurs, rubs, or gallops.  ABDOMEN: Soft, nontender, nondistended. Bowel sounds present. No organomegaly or mass.  EXTREMITIES: No cyanosis, clubbing, + 2 edema b/l, signs of chronic venous stasis bilaterally and now wrapped in UNNA boots.  NEUROLOGIC: Cranial nerves II through XII are intact. No focal Motor or sensory deficits b/l. Globally weak  PSYCHIATRIC: The patient is alert and oriented x 2.  SKIN: No obvious rash, lesion, or ulcer.    DATA REVIEW:   CBC  Recent Labs Lab 04/05/17 0411  WBC 10.9  HGB 9.3*  HCT 27.6*  PLT 510*  Chemistries   Recent Labs Lab 04/03/17 1141 04/03/17 1153  04/05/17 0411  NA 136  --   < > 136  K 2.7*  --   < > 3.2*  CL 103  --   < > 108  CO2 23  --   < > 22  GLUCOSE 88  --   < > 72  BUN 6  --   < > <5*  CREATININE 1.08*  --   < > 0.95  CALCIUM 7.4*  --   < > 7.1*  MG 1.8  --   --   --   AST  --  49*  --   --   ALT  --  22  --   --   ALKPHOS  --  126  --   --   BILITOT  --  0.7  --   --   < > = values in this interval not displayed.  Cardiac Enzymes No results for input(s): TROPONINI in the last 168 hours.  Microbiology Results  Results for orders placed or performed during the hospital encounter of 04/03/17  Blood culture (routine x 2)     Status: None (Preliminary result)   Collection Time: 04/03/17  1:11 PM  Result Value Ref Range Status   Specimen Description BLOOD RIGHT ANTECUBITAL  Final   Special Requests   Final    BOTTLES DRAWN AEROBIC AND ANAEROBIC Blood Culture adequate volume   Culture NO GROWTH 2 DAYS  Final   Report Status PENDING  Incomplete  Blood culture (routine x 2)     Status: None (Preliminary result)   Collection Time: 04/03/17  1:11 PM  Result Value Ref Range Status   Specimen Description BLOOD RIGHT FOREARM   Final   Special Requests BOTTLES DRAWN AEROBIC AND ANAEROBIC BCAV  Final   Culture NO GROWTH 2 DAYS  Final   Report Status PENDING  Incomplete  MRSA PCR Screening     Status: None   Collection Time: 04/04/17  1:16 PM  Result Value Ref Range Status   MRSA by PCR NEGATIVE NEGATIVE Final    Comment:        The GeneXpert MRSA Assay (FDA approved for NASAL specimens only), is one component of a comprehensive MRSA colonization surveillance program. It is not intended to diagnose MRSA infection nor to guide or monitor treatment for MRSA infections.     RADIOLOGY:  No results found.    Management plans discussed with  the patient, family and they are in agreement.  CODE STATUS:     Code Status Orders        Start     Ordered   04/03/17 1724  Full code  Continuous     04/03/17 1723          TOTAL TIME TAKING CARE OF THIS PATIENT: 40 minutes.    Henreitta Leber M.D on 04/05/2017 at 9:40 AM  Between 7am to 6pm - Pager - [phone]  After 6pm go to [email or web address] - Proofreader  [facility] [place] [facility]  Office  [phone]  CC: Primary care physician; Kiara Market, MD

## 2017-04-05 NOTE — Progress Notes (Signed)
DISCHARGE NOTE:  Pt given discharge instructions. Pt verbalized understating. Pt wheeled to car by staff.

## 2017-04-05 NOTE — Progress Notes (Signed)
Pharmacy Antibiotic Note  [name] is a 46 y.o. female admitted on 04/03/2017 with cellulitis.  Pharmacy has been consulted for Unasyn and vancomycin dosing.  Vancomycin trough= 26 mcg/ml  Plan: 1. Continue Unasyn 3 gm IV Q6H. 2. Will decrease vancomycin dosing to 1250 mg iv q 24 hours and plan on checking a trough with the 4th dose. Goal trough 15-20 mcg/ml with chronic venous stasis  Height: 5\' 6"  (167.6 cm) Weight: 223 lb 12.8 oz (101.5 kg) IBW/kg (Calculated) : 59.3  Temp (24hrs), Avg:98.7 F (37.1 C), Min:98.1 F (36.7 C), Max:99.1 F (37.3 C)   Recent Labs Lab 04/03/17 1141 04/03/17 1509 04/03/17 1807 04/04/17 0510 04/04/17 2123 04/05/17 0411 04/05/17 0747  WBC 16.0*  --   --  9.9  --  10.9  --   CREATININE 1.08*  --   --  1.03*  --  0.95  --   LATICACIDVEN  --  3.1* 5.1*  --  4.2*  --   --   VANCOTROUGH  --   --   --   --   --   --  26*    Estimated Creatinine Clearance: 90 mL/min (by C-G formula based on SCr of 0.95 mg/dL).    Allergies  Allergen Reactions  . Peanut Oil Other (See Comments)    Face turns red  . Risperidone And Related Cough   Thank you for allowing pharmacy to be a part of this patient's care.  Ulice Dash D, Pharm.D., BCPS Clinical Pharmacist 04/05/2017 8:47 AM

## 2017-04-08 LAB — CULTURE, BLOOD (ROUTINE X 2)
CULTURE: NO GROWTH
Culture: NO GROWTH
SPECIAL REQUESTS: ADEQUATE

## 2017-04-13 ENCOUNTER — Encounter: Payer: Self-pay | Admitting: Family Medicine

## 2017-04-17 ENCOUNTER — Encounter: Payer: Self-pay | Admitting: Oncology

## 2017-04-17 ENCOUNTER — Inpatient Hospital Stay: Payer: Medicare Other | Attending: Oncology | Admitting: Oncology

## 2017-04-17 ENCOUNTER — Inpatient Hospital Stay: Payer: Medicare Other

## 2017-04-17 VITALS — BP 100/62 | HR 73 | Temp 97.3°F | Resp 18 | Ht 64.57 in | Wt 212.4 lb

## 2017-04-17 DIAGNOSIS — M79605 Pain in left leg: Secondary | ICD-10-CM

## 2017-04-17 DIAGNOSIS — Z8781 Personal history of (healed) traumatic fracture: Secondary | ICD-10-CM | POA: Diagnosis not present

## 2017-04-17 DIAGNOSIS — E039 Hypothyroidism, unspecified: Secondary | ICD-10-CM | POA: Insufficient documentation

## 2017-04-17 DIAGNOSIS — K5090 Crohn's disease, unspecified, without complications: Secondary | ICD-10-CM | POA: Diagnosis not present

## 2017-04-17 DIAGNOSIS — E669 Obesity, unspecified: Secondary | ICD-10-CM | POA: Insufficient documentation

## 2017-04-17 DIAGNOSIS — E785 Hyperlipidemia, unspecified: Secondary | ICD-10-CM | POA: Diagnosis not present

## 2017-04-17 DIAGNOSIS — F329 Major depressive disorder, single episode, unspecified: Secondary | ICD-10-CM | POA: Diagnosis not present

## 2017-04-17 DIAGNOSIS — F209 Schizophrenia, unspecified: Secondary | ICD-10-CM | POA: Insufficient documentation

## 2017-04-17 DIAGNOSIS — K449 Diaphragmatic hernia without obstruction or gangrene: Secondary | ICD-10-CM | POA: Diagnosis not present

## 2017-04-17 DIAGNOSIS — M79604 Pain in right leg: Secondary | ICD-10-CM | POA: Diagnosis not present

## 2017-04-17 DIAGNOSIS — Z8709 Personal history of other diseases of the respiratory system: Secondary | ICD-10-CM | POA: Diagnosis not present

## 2017-04-17 DIAGNOSIS — E7220 Disorder of urea cycle metabolism, unspecified: Secondary | ICD-10-CM | POA: Insufficient documentation

## 2017-04-17 DIAGNOSIS — E119 Type 2 diabetes mellitus without complications: Secondary | ICD-10-CM

## 2017-04-17 DIAGNOSIS — D72820 Lymphocytosis (symptomatic): Secondary | ICD-10-CM | POA: Diagnosis not present

## 2017-04-17 DIAGNOSIS — Z87442 Personal history of urinary calculi: Secondary | ICD-10-CM | POA: Diagnosis not present

## 2017-04-17 DIAGNOSIS — I10 Essential (primary) hypertension: Secondary | ICD-10-CM | POA: Diagnosis not present

## 2017-04-17 DIAGNOSIS — R531 Weakness: Secondary | ICD-10-CM | POA: Insufficient documentation

## 2017-04-17 DIAGNOSIS — N179 Acute kidney failure, unspecified: Secondary | ICD-10-CM | POA: Diagnosis not present

## 2017-04-17 DIAGNOSIS — E559 Vitamin D deficiency, unspecified: Secondary | ICD-10-CM | POA: Diagnosis not present

## 2017-04-17 DIAGNOSIS — R5383 Other fatigue: Secondary | ICD-10-CM

## 2017-04-17 DIAGNOSIS — D539 Nutritional anemia, unspecified: Secondary | ICD-10-CM | POA: Diagnosis not present

## 2017-04-17 DIAGNOSIS — K219 Gastro-esophageal reflux disease without esophagitis: Secondary | ICD-10-CM

## 2017-04-17 LAB — CBC WITH DIFFERENTIAL/PLATELET
BASOS ABS: 0.2 10*3/uL — AB (ref 0–0.1)
BASOS PCT: 1 %
EOS ABS: 0 10*3/uL (ref 0–0.7)
Eosinophils Relative: 0 %
HEMATOCRIT: 30.8 % — AB (ref 35.0–47.0)
Hemoglobin: 10.2 g/dL — ABNORMAL LOW (ref 12.0–16.0)
Lymphocytes Relative: 74 %
Lymphs Abs: 13.2 10*3/uL — ABNORMAL HIGH (ref 1.0–3.6)
MCH: 29.4 pg (ref 26.0–34.0)
MCHC: 33 g/dL (ref 32.0–36.0)
MCV: 88.9 fL (ref 80.0–100.0)
MONO ABS: 0.9 10*3/uL (ref 0.2–0.9)
Monocytes Relative: 5 %
NEUTROS ABS: 3.5 10*3/uL (ref 1.4–6.5)
Neutrophils Relative %: 20 %
PLATELETS: 348 10*3/uL (ref 150–440)
RBC: 3.47 MIL/uL — ABNORMAL LOW (ref 3.80–5.20)
RDW: 15.5 % — AB (ref 11.5–14.5)
WBC: 17.7 10*3/uL — ABNORMAL HIGH (ref 3.6–11.0)

## 2017-04-17 LAB — RETICULOCYTES
RBC.: 3.36 MIL/uL — AB (ref 3.80–5.20)
RETIC COUNT ABSOLUTE: 53.8 10*3/uL (ref 19.0–183.0)
RETIC CT PCT: 1.6 % (ref 0.4–3.1)

## 2017-04-17 NOTE — Progress Notes (Signed)
Here for new pt evaluation  Pt has dx of Traumatic brain injury,hepatic encephalopathy,undiff.schizophrenia. She gives sporadic responses. Care giver staff Shanon Brow w her and states he does not know her well-has been in [facility] care home since end of May/18. Hx obtained from records/fl2  Sitting in w/c.cooperative and in NAD.

## 2017-04-17 NOTE — Progress Notes (Signed)
Hematology/Oncology Consult note [facility] [phone] [phone]  Patient Care Team: Lorelee Market, MD as PCP - General (Family Medicine) Bary Castilla Forest Gleason, MD (General Surgery)   Name of the patient: [name]  [id number]  1971-09-27    Reason for referral- lymphocytosis   Referring physician- Threasa Alpha NP  Date of visit: 04/17/17   History of presenting illness- Patient is a 46 year old female with a past medical history significant for Crohn's disease, hypertension, dysrhythmia, type 2 diabetes and hypothyroidism among other medical problems. She also has a h/o schizophrenia and lives in a group home. She was admitted to hospital in June 5284 for metabolic encephalopathy and was discharged.   recent CBC from 03/30/2017 showed white count of 10.9, H&H of 9.8/31 with an ANC nadir 100 and platelet count 540. Differential on the CBC showed lymphocytosis with an absolute lymphocyte count of 7.1. CMP was within normal limits except for a low albumin of 2.0. TSH was within normal limits. Free T4 was mildly elevated at 1.25  Today she is here with her group home provider. She reports fatigue and pain in her legs. She does not know much about her medical history and is a poor historian.   ECOG PS- 2  Pain scale- 3   Review of systems- limited as patient is a poor historian  - Review of Systems  Constitutional: Positive for malaise/fatigue. Negative for chills, fever and weight loss.  HENT: Negative for congestion, ear discharge and nosebleeds.   Eyes: Negative for blurred vision.  Respiratory: Negative for cough, hemoptysis, sputum production, shortness of breath and wheezing.   Cardiovascular: Negative for chest pain, palpitations, orthopnea and claudication.  Gastrointestinal: Negative for abdominal pain, blood in stool, constipation, diarrhea, heartburn, melena, nausea and vomiting.  Genitourinary: Negative for dysuria,  flank pain, frequency, hematuria and urgency.  Musculoskeletal: Negative for back pain, joint pain and myalgias.  Skin: Negative for rash.  Neurological: Positive for headaches. Negative for dizziness, tingling, focal weakness, seizures and weakness.  Endo/Heme/Allergies: Does not bruise/bleed easily.  Psychiatric/Behavioral: Negative for depression and suicidal ideas. The patient does not have insomnia.     Allergies  Allergen Reactions  . Peanut Oil Other (See Comments)    Face turns red  . Risperidone And Related Cough    Patient Active Problem List   Diagnosis Date Noted  . Sepsis ([place]) 04/03/2017  . Elevated lactic acid level   . Hepatic encephalopathy ([place])   . Shock ([place])   . Hyperammonemia ([place]) 03/02/2017  . Sepsis secondary to UTI ([place]) 02/13/2017  . Hypotension 01/23/2017  . Scalp laceration   . Pressure injury of skin 10/02/2016  . 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 Acute renal failure (ARF) ([place])   . Respiratory failure ([place]) 09/27/2016  . Other sign and symptom in breast      Past Medical History:  Diagnosis Date  . Abnormal mammogram, unspecified 2013   Prev. cytology,hypercellular smears without evidence of malignant cells. The cytopathologist questioned if samples truly representative. Care taken during sampling and is felt to be representative.  . Acute renal failure (ARF) ([place])    per fl2  . Breast screening, unspecified 2013  . Broken leg    age 29  . Cellulitis   . Crohn's disease ([place]) 2013  . Depression   . Diabetes mellitus without complication (HCC)    non insulin dependent  . Early menopause   . Edema   . Encephalopathy    hepatic encephalopathy  per fl2   . GERD (gastroesophageal reflux disease)   . Hiatal hernia 2013  . Hyperammonemia ([place])    per fl2  . Hyperlipidemia   . Hypertension   . Hypotension    per fl2  . Hypotension, chronic   . Hypothyroidism   . 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 Obesity, unspecified 2013  . Other sign and symptom in breast 2013   Right bst US,lower outer quadrant,A single 0.3x0.4x0.6cm hypoechoic mass with slightly lobulated borders with adjacent 0.3x0.4x0.5cm mass was noted. The 1st was 5cm from nipple, 2nd at 8 cm from the nipple. The previous lesion aspirate was at 3 0'clock position. These lesions are thought to account for the mammographic abnormality. Minimal interval change on [place].   Marland Kitchen Recurrent cellulitis of lower leg   . Regional enteritis [facility])   . Rib fracture    age 9  . Scalp laceration    per fl2  . Schizophrenia ([place])   . Schizophrenia, undifferentiated ([place])    per fl2  . TBI (traumatic brain injury) ([place])   . Thyroid disease    hypothyroid  . Vitamin D deficiency      Past Surgical History:  Procedure Laterality Date  . COLONOSCOPY WITH PROPOFOL N/A 02/16/2016   Procedure: COLONOSCOPY WITH PROPOFOL;  Surgeon: Lollie Sails, MD;  Location: [facility] ENDOSCOPY;  Service: Endoscopy;  Laterality: N/A;  . COLONOSCOPY WITH PROPOFOL N/A 02/22/2016   Procedure: COLONOSCOPY WITH PROPOFOL;  Surgeon: Lollie Sails, MD;  Location: [facility] ENDOSCOPY;  Service: Endoscopy;  Laterality: N/A;  . COLONOSCOPY WITH PROPOFOL N/A 10/14/2016   Procedure: COLONOSCOPY WITH PROPOFOL;  Surgeon: Jonathon Bellows, MD;  Location: [facility] ENDOSCOPY;  Service: Endoscopy;  Laterality: N/A;  . EYE SURGERY Left 2013   cataract surgery  . PERIPHERAL VASCULAR CATHETERIZATION N/A 10/13/2016   Procedure: Dialysis/Perma Catheter Insertion;  Surgeon: Algernon Huxley, MD;  Location: [place] CV LAB;  Service: Cardiovascular;  Laterality: N/A;  . PERIPHERAL VASCULAR CATHETERIZATION N/A 10/18/2016   Procedure: Dialysis/Perma Catheter Removal;  Surgeon: Katha Cabal, MD;  Location: [place] CV LAB;  Service: Cardiovascular;  Laterality: N/A;  . TUMOR EXCISION Right    age of 53, tumor removed from [place] History  . Marital  status: Single    Spouse name: N/A  . Number of children: N/A  . Years of education: N/A   Occupational History  . Not on file.   Social History Main Topics  . Smoking status: Never Smoker  . Smokeless tobacco: Never Used  . Alcohol use No  . Drug use: No  . Sexual activity: No   Other Topics Concern  . Not on file   Social History Narrative  . No narrative on file     Family History  Problem Relation Age of Onset  . Cancer Mother 55       colon  . Cancer Paternal Aunt      Current Outpatient Prescriptions:  .  Amino Acids-Protein Hydrolys (FEEDING SUPPLEMENT, PRO-STAT SUGAR FREE 64,) LIQD, Take 30 mLs by mouth 3 (three) times daily with meals., Disp: , Rfl:  .  bisacodyl (DULCOLAX) 5 MG EC tablet, Take 5 mg by mouth daily as needed for moderate constipation., Disp: , Rfl:  .  calcium carbonate (OSCAL) 1500 (600 Ca) MG TABS tablet, Take 1,500 mg by mouth 2 (two) times daily with a meal., Disp: , Rfl:  .  Cholecalciferol (VITAMIN D3) 5000 units CAPS, Take 5,000 Units by mouth daily., Disp: , Rfl:  .  citalopram (CELEXA) 40 MG tablet, Take 40 mg by mouth daily., Disp: , Rfl:  .  diphenhydrAMINE (BENADRYL) 25 MG tablet, Take 50 mg by mouth at bedtime as needed., Disp: , Rfl:  .  diphenoxylate-atropine (LOMOTIL) 2.5-0.025 MG tablet, Take 2 tablets by mouth every 2 (two) hours as needed for diarrhea or loose stools., Disp: , Rfl:  .  feeding supplement, ENSURE ENLIVE, (ENSURE ENLIVE) LIQD, Take 237 mLs by mouth 2 (two) times daily between meals., Disp: 237 mL, Rfl: 12 .  furosemide (LASIX) 40 MG tablet, Take 1 tablet (40 mg total) by mouth daily., Disp: 30 tablet, Rfl: 0 .  gemfibrozil (LOPID) 600 MG tablet, Take 600 mg by mouth 2 (two) times daily before a meal., Disp: , Rfl:  .  hyoscyamine (LEVSIN, ANASPAZ) 0.125 MG tablet, Take 0.125 mg by mouth every 6 (six) hours as needed., Disp: , Rfl:  .  lactulose (CHRONULAC) 10 GM/15ML solution, Take 45 mLs (30 g total) by mouth 2  (two) times daily., Disp: 240 mL, Rfl: 0 .  levothyroxine (SYNTHROID, LEVOTHROID) 50 MCG tablet, Take 1 tablet (50 mcg total) by mouth daily before breakfast., Disp: 30 tablet, Rfl: 0 .  LORazepam (ATIVAN) 1 MG tablet, Take 1 mg by mouth daily., Disp: , Rfl:  .  magnesium oxide (MAGNESIUM-OXIDE) 400 (241.3 Mg) MG tablet, Take 400 mg by mouth 2 (two) times daily., Disp: , Rfl:  .  midodrine (PROAMATINE) 10 MG tablet, Take 10 mg by mouth 4 (four) times daily., Disp: , Rfl:  .  Multiple Vitamin (MULTIVITAMIN WITH MINERALS) TABS tablet, Take 1 tablet by mouth daily., Disp: , Rfl:  .  omega-3 acid ethyl esters (LOVAZA) 1 g capsule, Take 1 g by mouth daily., Disp: , Rfl:  .  pantoprazole (PROTONIX) 40 MG tablet, Take 40 mg by mouth daily., Disp: , Rfl:  .  potassium chloride (K-DUR,KLOR-CON) 10 MEQ tablet, Take 10 mEq by mouth 2 (two) times daily. , Disp: , Rfl:  .  vitamin B-12 (CYANOCOBALAMIN) 1000 MCG tablet, Take 1,000 mcg by mouth daily., Disp: , Rfl:  .  vitamin C (ASCORBIC ACID) 250 MG tablet, Take 500 mg by mouth daily., Disp: , Rfl:  .  mesalamine (APRISO) 0.375 g 24 hr capsule, Take 1,500 mg by mouth daily., Disp: , Rfl:  .  pseudoephedrine-guaifenesin (MUCINEX D) 60-600 MG 12 hr tablet, Take 1 tablet by mouth every 12 (twelve) hours as needed for congestion., Disp: , Rfl:    Physical exam:  Vitals:   04/17/17 1505  BP: 100/62  Pulse: 73  Resp: 18  Temp: 97.3 F (36.3 C)  TempSrc: Tympanic  Weight: 212 lb 6.6 oz (96.3 kg)  Height: 5' 4.57" (1.64 m)   Physical Exam  Constitutional: She is oriented to person, place, and time.  Obese, in no acute distress. She is sitting in a wheelchair  HENT:  Head: Normocephalic and atraumatic.  Eyes: EOM are normal. Pupils are equal, round, and reactive to light.  Neck: Normal range of motion.  Cardiovascular: Normal rate, regular rhythm and normal heart sounds.   Pulmonary/Chest: Effort normal and breath sounds normal.  Abdominal: Soft. Bowel  sounds are normal.  Musculoskeletal: She exhibits edema.  Left forearm is wrapped with cae wrap due to recent injury. compression wraps over b/l LE  Neurological: She is alert and oriented to person, place, and time.  Skin: Skin  is warm and dry.       CMP Latest Ref Rng & Units 04/05/2017  Glucose 65 - 99 mg/dL 72  BUN 6 - 20 mg/dL <5(L)  Creatinine 0.44 - 1.00 mg/dL 0.95  Sodium 135 - 145 mmol/L 136  Potassium 3.5 - 5.1 mmol/L 3.2(L)  Chloride 101 - 111 mmol/L 108  CO2 22 - 32 mmol/L 22  Calcium 8.9 - 10.3 mg/dL 7.1(L)  Total Protein 6.5 - 8.1 g/dL -  Total Bilirubin 0.3 - 1.2 mg/dL -  Alkaline Phos 38 - 126 U/L -  AST 15 - 41 U/L -  ALT 14 - 54 U/L -   CBC Latest Ref Rng & Units 04/05/2017  WBC 3.6 - 11.0 K/uL 10.9  Hemoglobin 12.0 - 16.0 g/dL 9.3(L)  Hematocrit 35.0 - 47.0 % 27.6(L)  Platelets 150 - 440 K/uL 510(H)     Assessment and plan- Patient is a 46 y.o. female referred to [place] for lymphocytosis but also found to have macrocytic anemia and thrombocytosis  Lymphocytosis- this can be reactive clonal or reactive. Will check cbc with diff, pathology review of smear and peripheral flow cytometry  Macrocytic anemia- Patient has had long standing normocytic anemia in the past and her hb ranges between 7-10 since dec 2017. Prior to that her hb was around 11.  check cbc with diff, ferritin and iron studies, b12, folate, retic count, haptoglobin and myeloma panel.   Thrombocytosis - on review of prior cbcs, she had a normal platelet count until dec 2017. Since then counts between 400-600. Could be reactive due to anemia or chronic disease. Will check JAK2 mutation testing and ESR  rtc in 2-3 weeks to discuss further management   Thank you for this kind referral and the opportunity to participate in the care of this patient   Visit Diagnosis 1. Lymphocytosis   2. Macrocytic anemia     Dr. Randa Evens, MD, MPH [facility] Pager-  [phone] 04/17/2017 11:07 AM

## 2017-04-18 LAB — MULTIPLE MYELOMA PANEL, SERUM
ALBUMIN/GLOB SERPL: 0.7 (ref 0.7–1.7)
ALPHA2 GLOB SERPL ELPH-MCNC: 0.4 g/dL (ref 0.4–1.0)
Albumin SerPl Elph-Mcnc: 1.8 g/dL — ABNORMAL LOW (ref 2.9–4.4)
Alpha 1: 0.2 g/dL (ref 0.0–0.4)
B-GLOBULIN SERPL ELPH-MCNC: 0.6 g/dL — AB (ref 0.7–1.3)
Gamma Glob SerPl Elph-Mcnc: 1.4 g/dL (ref 0.4–1.8)
Globulin, Total: 2.6 g/dL (ref 2.2–3.9)
IGG (IMMUNOGLOBIN G), SERUM: 1063 mg/dL (ref 700–1600)
IgA: 317 mg/dL (ref 87–352)
IgM, Serum: 238 mg/dL — ABNORMAL HIGH (ref 26–217)
TOTAL PROTEIN ELP: 4.4 g/dL — AB (ref 6.0–8.5)

## 2017-04-18 LAB — IRON AND TIBC: Iron: 55 ug/dL (ref 28–170)

## 2017-04-18 LAB — FOLATE: Folate: 10.1 ng/mL (ref >5.9)

## 2017-04-18 LAB — HAPTOGLOBIN: Haptoglobin: 46 mg/dL (ref 34–200)

## 2017-04-18 LAB — VITAMIN B12: VITAMIN B 12: 2488 pg/mL — AB (ref 180–914)

## 2017-04-18 LAB — FERRITIN: FERRITIN: 445 ng/mL — AB (ref 11–307)

## 2017-04-19 ENCOUNTER — Encounter: Payer: Self-pay | Admitting: Oncology

## 2017-04-21 ENCOUNTER — Telehealth: Payer: Self-pay | Admitting: *Deleted

## 2017-04-21 ENCOUNTER — Other Ambulatory Visit: Payer: Self-pay | Admitting: *Deleted

## 2017-04-21 DIAGNOSIS — D509 Iron deficiency anemia, unspecified: Secondary | ICD-10-CM

## 2017-04-21 DIAGNOSIS — D72820 Lymphocytosis (symptomatic): Secondary | ICD-10-CM

## 2017-04-21 NOTE — Telephone Encounter (Signed)
-----   Message from Sindy Guadeloupe, MD sent at 04/19/2017 10:57 AM EDT ----- Judeen Hammans- we need to get flow cytometry, JAK2 and bcr abl testing on this patient before next appt. I might have forgotten to tell you. Can this be done at her group home or can she come sometime for this? We can move her appointment accordingly  Thanks, Astrid Divine

## 2017-04-21 NOTE — Telephone Encounter (Signed)
Called pt's home phone number and spoke to family and I explained that Dr. Janese Banks wanted pt to come over and have more labs drawn and 2 of the labs take about 1 week to get results back.  The family was willing to bring pt on Monday at 3 pm. I have entered orders for Monday and [place] in scheduling put pt on for lab only.

## 2017-04-24 ENCOUNTER — Emergency Department: Payer: Medicare Other

## 2017-04-24 ENCOUNTER — Inpatient Hospital Stay: Payer: Medicare Other

## 2017-04-24 ENCOUNTER — Inpatient Hospital Stay
Admission: EM | Admit: 2017-04-24 | Discharge: 2017-05-31 | DRG: 308 | Disposition: E | Payer: Medicare Other | Attending: Internal Medicine | Admitting: Internal Medicine

## 2017-04-24 DIAGNOSIS — K219 Gastro-esophageal reflux disease without esophagitis: Secondary | ICD-10-CM | POA: Diagnosis present

## 2017-04-24 DIAGNOSIS — K5090 Crohn's disease, unspecified, without complications: Secondary | ICD-10-CM | POA: Diagnosis present

## 2017-04-24 DIAGNOSIS — F203 Undifferentiated schizophrenia: Secondary | ICD-10-CM | POA: Diagnosis present

## 2017-04-24 DIAGNOSIS — D509 Iron deficiency anemia, unspecified: Secondary | ICD-10-CM

## 2017-04-24 DIAGNOSIS — E039 Hypothyroidism, unspecified: Secondary | ICD-10-CM | POA: Diagnosis present

## 2017-04-24 DIAGNOSIS — I9589 Other hypotension: Secondary | ICD-10-CM | POA: Diagnosis present

## 2017-04-24 DIAGNOSIS — I469 Cardiac arrest, cause unspecified: Secondary | ICD-10-CM | POA: Diagnosis present

## 2017-04-24 DIAGNOSIS — F329 Major depressive disorder, single episode, unspecified: Secondary | ICD-10-CM | POA: Diagnosis present

## 2017-04-24 DIAGNOSIS — E872 Acidosis: Secondary | ICD-10-CM | POA: Diagnosis present

## 2017-04-24 DIAGNOSIS — Z515 Encounter for palliative care: Secondary | ICD-10-CM

## 2017-04-24 DIAGNOSIS — R570 Cardiogenic shock: Secondary | ICD-10-CM | POA: Diagnosis present

## 2017-04-24 DIAGNOSIS — I4901 Ventricular fibrillation: Secondary | ICD-10-CM | POA: Diagnosis present

## 2017-04-24 DIAGNOSIS — IMO0002 Reserved for concepts with insufficient information to code with codable children: Secondary | ICD-10-CM

## 2017-04-24 DIAGNOSIS — E876 Hypokalemia: Secondary | ICD-10-CM | POA: Diagnosis present

## 2017-04-24 DIAGNOSIS — R680 Hypothermia, not associated with low environmental temperature: Secondary | ICD-10-CM | POA: Diagnosis present

## 2017-04-24 DIAGNOSIS — K7290 Hepatic failure, unspecified without coma: Secondary | ICD-10-CM | POA: Diagnosis present

## 2017-04-24 DIAGNOSIS — I110 Hypertensive heart disease with heart failure: Secondary | ICD-10-CM | POA: Diagnosis present

## 2017-04-24 DIAGNOSIS — N179 Acute kidney failure, unspecified: Secondary | ICD-10-CM

## 2017-04-24 DIAGNOSIS — E7220 Disorder of urea cycle metabolism, unspecified: Secondary | ICD-10-CM | POA: Diagnosis present

## 2017-04-24 DIAGNOSIS — E785 Hyperlipidemia, unspecified: Secondary | ICD-10-CM | POA: Diagnosis present

## 2017-04-24 DIAGNOSIS — G9340 Encephalopathy, unspecified: Secondary | ICD-10-CM

## 2017-04-24 DIAGNOSIS — R7989 Other specified abnormal findings of blood chemistry: Secondary | ICD-10-CM

## 2017-04-24 DIAGNOSIS — I255 Ischemic cardiomyopathy: Secondary | ICD-10-CM | POA: Diagnosis present

## 2017-04-24 DIAGNOSIS — D649 Anemia, unspecified: Secondary | ICD-10-CM | POA: Diagnosis present

## 2017-04-24 DIAGNOSIS — R748 Abnormal levels of other serum enzymes: Secondary | ICD-10-CM | POA: Diagnosis not present

## 2017-04-24 DIAGNOSIS — Z79899 Other long term (current) drug therapy: Secondary | ICD-10-CM

## 2017-04-24 DIAGNOSIS — E8809 Other disorders of plasma-protein metabolism, not elsewhere classified: Secondary | ICD-10-CM | POA: Diagnosis present

## 2017-04-24 DIAGNOSIS — I5021 Acute systolic (congestive) heart failure: Secondary | ICD-10-CM | POA: Diagnosis present

## 2017-04-24 DIAGNOSIS — E46 Unspecified protein-calorie malnutrition: Secondary | ICD-10-CM | POA: Diagnosis present

## 2017-04-24 DIAGNOSIS — D72829 Elevated white blood cell count, unspecified: Secondary | ICD-10-CM | POA: Diagnosis present

## 2017-04-24 DIAGNOSIS — E119 Type 2 diabetes mellitus without complications: Secondary | ICD-10-CM | POA: Diagnosis present

## 2017-04-24 DIAGNOSIS — J9601 Acute respiratory failure with hypoxia: Secondary | ICD-10-CM | POA: Diagnosis present

## 2017-04-24 DIAGNOSIS — R9431 Abnormal electrocardiogram [ECG] [EKG]: Secondary | ICD-10-CM | POA: Diagnosis not present

## 2017-04-24 DIAGNOSIS — Z66 Do not resuscitate: Secondary | ICD-10-CM | POA: Diagnosis present

## 2017-04-24 DIAGNOSIS — Z6839 Body mass index (BMI) 39.0-39.9, adult: Secondary | ICD-10-CM

## 2017-04-24 DIAGNOSIS — D72820 Lymphocytosis (symptomatic): Secondary | ICD-10-CM

## 2017-04-24 DIAGNOSIS — I248 Other forms of acute ischemic heart disease: Secondary | ICD-10-CM | POA: Diagnosis present

## 2017-04-24 DIAGNOSIS — K449 Diaphragmatic hernia without obstruction or gangrene: Secondary | ICD-10-CM | POA: Diagnosis present

## 2017-04-24 DIAGNOSIS — G931 Anoxic brain damage, not elsewhere classified: Secondary | ICD-10-CM

## 2017-04-24 DIAGNOSIS — Z91010 Allergy to peanuts: Secondary | ICD-10-CM

## 2017-04-24 DIAGNOSIS — R4020 Unspecified coma: Secondary | ICD-10-CM | POA: Diagnosis not present

## 2017-04-24 DIAGNOSIS — R778 Other specified abnormalities of plasma proteins: Secondary | ICD-10-CM

## 2017-04-24 DIAGNOSIS — Z87820 Personal history of traumatic brain injury: Secondary | ICD-10-CM

## 2017-04-24 DIAGNOSIS — S069X9A Unspecified intracranial injury with loss of consciousness of unspecified duration, initial encounter: Secondary | ICD-10-CM

## 2017-04-24 DIAGNOSIS — S069XAA Unspecified intracranial injury with loss of consciousness status unknown, initial encounter: Secondary | ICD-10-CM

## 2017-04-24 DIAGNOSIS — Z888 Allergy status to other drugs, medicaments and biological substances status: Secondary | ICD-10-CM

## 2017-04-24 LAB — BASIC METABOLIC PANEL
ANION GAP: 9 (ref 5–15)
BUN: 5 mg/dL — ABNORMAL LOW (ref 6–20)
CALCIUM: 6.8 mg/dL — AB (ref 8.9–10.3)
CO2: 24 mmol/L (ref 22–32)
Chloride: 99 mmol/L — ABNORMAL LOW (ref 101–111)
Creatinine, Ser: 1.07 mg/dL — ABNORMAL HIGH (ref 0.44–1.00)
GLUCOSE: 151 mg/dL — AB (ref 65–99)
Potassium: 2.7 mmol/L — CL (ref 3.5–5.1)
SODIUM: 132 mmol/L — AB (ref 135–145)

## 2017-04-24 LAB — CBC WITH DIFFERENTIAL/PLATELET
BASOS ABS: 0 10*3/uL (ref 0–0.1)
Basophils Absolute: 0.2 10*3/uL — ABNORMAL HIGH (ref 0–0.1)
Basophils Relative: 1 %
Basophils Relative: 0 %
EOS ABS: 0 10*3/uL (ref 0–0.7)
EOS PCT: 0 %
Eosinophils Absolute: 0 10*3/uL (ref 0–0.7)
Eosinophils Relative: 0 %
HCT: 30.7 % — ABNORMAL LOW (ref 35.0–47.0)
HCT: 27.9 % — ABNORMAL LOW (ref 35.0–47.0)
HEMOGLOBIN: 10.4 g/dL — AB (ref 12.0–16.0)
HEMOGLOBIN: 9.3 g/dL — AB (ref 12.0–16.0)
LYMPHS ABS: 12.8 10*3/uL — AB (ref 1.0–3.6)
LYMPHS PCT: 76 %
Lymphocytes Relative: 57 %
Lymphs Abs: 15.7 10*3/uL — ABNORMAL HIGH (ref 1.0–3.6)
MCH: 30.4 pg (ref 26.0–34.0)
MCH: 31.4 pg (ref 26.0–34.0)
MCHC: 33.8 g/dL (ref 32.0–36.0)
MCHC: 33.4 g/dL (ref 32.0–36.0)
MCV: 89.9 fL (ref 80.0–100.0)
MCV: 94 fL (ref 80.0–100.0)
MONO ABS: 0.6 10*3/uL (ref 0.2–0.9)
MONOS PCT: 3 %
MONOS PCT: 2 %
Monocytes Absolute: 0.6 10*3/uL (ref 0.2–0.9)
NEUTROS ABS: 11.4 10*3/uL — AB (ref 1.4–6.5)
NEUTROS PCT: 20 %
Neutro Abs: 3.5 10*3/uL (ref 1.4–6.5)
Neutrophils Relative %: 41 %
Platelets: 442 10*3/uL — ABNORMAL HIGH (ref 150–440)
Platelets: 356 10*3/uL (ref 150–440)
RBC: 3.42 MIL/uL — AB (ref 3.80–5.20)
RBC: 2.97 MIL/uL — AB (ref 3.80–5.20)
RDW: 15.5 % — ABNORMAL HIGH (ref 11.5–14.5)
RDW: 15 % — AB (ref 11.5–14.5)
WBC: 17.2 10*3/uL — ABNORMAL HIGH (ref 3.6–11.0)
WBC: 27.7 10*3/uL — AB (ref 3.6–11.0)

## 2017-04-24 LAB — URINALYSIS, COMPLETE (UACMP) WITH MICROSCOPIC
Bilirubin Urine: NEGATIVE
Glucose, UA: NEGATIVE mg/dL
HGB URINE DIPSTICK: NEGATIVE
Ketones, ur: NEGATIVE mg/dL
Leukocytes, UA: NEGATIVE
NITRITE: NEGATIVE
Protein, ur: 30 mg/dL — AB
SPECIFIC GRAVITY, URINE: 1.004 — AB (ref 1.005–1.030)
pH: 6 (ref 5.0–8.0)

## 2017-04-24 LAB — COMPREHENSIVE METABOLIC PANEL
ALK PHOS: 179 U/L — AB (ref 38–126)
ALT: 56 U/L — AB (ref 14–54)
ANION GAP: 16 — AB (ref 5–15)
AST: 204 U/L — ABNORMAL HIGH (ref 15–41)
Albumin: 1.2 g/dL — ABNORMAL LOW (ref 3.5–5.0)
BILIRUBIN TOTAL: 0.8 mg/dL (ref 0.3–1.2)
BUN: <5 mg/dL — ABNORMAL LOW (ref 6–20)
CO2: 19 mmol/L — AB (ref 22–32)
CREATININE: 0.98 mg/dL (ref 0.44–1.00)
Calcium: 6.8 mg/dL — ABNORMAL LOW (ref 8.9–10.3)
Chloride: 96 mmol/L — ABNORMAL LOW (ref 101–111)
GFR calc Af Amer: >60 mL/min (ref >60)
Glucose, Bld: 125 mg/dL — ABNORMAL HIGH (ref 65–99)
Potassium: 2.9 mmol/L — ABNORMAL LOW (ref 3.5–5.1)
SODIUM: 131 mmol/L — AB (ref 135–145)
TOTAL PROTEIN: 3.7 g/dL — AB (ref 6.5–8.1)

## 2017-04-24 LAB — AMMONIA: Ammonia: 39 umol/L — ABNORMAL HIGH (ref 9–35)

## 2017-04-24 LAB — BLOOD GAS, ARTERIAL
ACID-BASE DEFICIT: 5.3 mmol/L — AB (ref 0.0–2.0)
Bicarbonate: 20 mmol/L (ref 20.0–28.0)
FIO2: 0.5
O2 Saturation: 98.2 %
PCO2 ART: 37 mmHg (ref 32.0–48.0)
PH ART: 7.34 — AB (ref 7.350–7.450)
Patient temperature: 37
RATE: 16 resp/min
VT: 500 mL
pO2, Arterial: 115 mmHg — ABNORMAL HIGH (ref 83.0–108.0)

## 2017-04-24 LAB — GLUCOSE, CAPILLARY
GLUCOSE-CAPILLARY: 151 mg/dL — AB (ref 65–99)
GLUCOSE-CAPILLARY: 184 mg/dL — AB (ref 65–99)
Glucose-Capillary: 108 mg/dL — ABNORMAL HIGH (ref 65–99)
Glucose-Capillary: 211 mg/dL — ABNORMAL HIGH (ref 65–99)

## 2017-04-24 LAB — ACETAMINOPHEN LEVEL

## 2017-04-24 LAB — MRSA PCR SCREENING: MRSA by PCR: NEGATIVE

## 2017-04-24 LAB — TROPONIN I: TROPONIN I: 0.09 ng/mL — AB (ref <0.03)

## 2017-04-24 LAB — LACTIC ACID, PLASMA
LACTIC ACID, VENOUS: 10.3 mmol/L — AB (ref 0.5–1.9)
Lactic Acid, Venous: 5.5 mmol/L (ref 0.5–1.9)

## 2017-04-24 LAB — APTT: APTT: 49 s — AB (ref 24–36)

## 2017-04-24 LAB — PROTIME-INR
INR: 1.38
PROTHROMBIN TIME: 17.1 s — AB (ref 11.4–15.2)

## 2017-04-24 LAB — MAGNESIUM: MAGNESIUM: 1.5 mg/dL — AB (ref 1.7–2.4)

## 2017-04-24 LAB — PREGNANCY, URINE: Preg Test, Ur: NEGATIVE

## 2017-04-24 LAB — SALICYLATE LEVEL: Salicylate Lvl: <7 mg/dL (ref 2.8–30.0)

## 2017-04-24 LAB — PROCALCITONIN: Procalcitonin: 0.72 ng/mL

## 2017-04-24 MED ORDER — POTASSIUM CHLORIDE 10 MEQ/50ML IV SOLN
10.0000 meq | INTRAVENOUS | Status: AC
  Administered 2017-04-24 – 2017-04-25 (×4): 10 meq via INTRAVENOUS
  Filled 2017-04-24 (×4): qty 50

## 2017-04-24 MED ORDER — SODIUM CHLORIDE 0.9 % IV SOLN
INTRAVENOUS | Status: DC
  Administered 2017-04-24 – 2017-04-29 (×3): via INTRAVENOUS

## 2017-04-24 MED ORDER — ACETAMINOPHEN 650 MG RE SUPP
650.0000 mg | Freq: Four times a day (QID) | RECTAL | Status: DC | PRN
Start: 2017-04-24 — End: 2017-04-28

## 2017-04-24 MED ORDER — ARTIFICIAL TEARS OPHTHALMIC OINT
1.0000 "application " | TOPICAL_OINTMENT | Freq: Three times a day (TID) | OPHTHALMIC | Status: DC
  Filled 2017-04-24 (×2): qty 3.5

## 2017-04-24 MED ORDER — FAMOTIDINE IN NACL 20-0.9 MG/50ML-% IV SOLN
20.0000 mg | Freq: Two times a day (BID) | INTRAVENOUS | Status: DC
  Administered 2017-04-25 (×3): 20 mg via INTRAVENOUS
  Filled 2017-04-24 (×3): qty 50

## 2017-04-24 MED ORDER — CISATRACURIUM BOLUS VIA INFUSION
0.1000 mg/kg | Freq: Once | INTRAVENOUS | Status: AC
  Administered 2017-04-24: 10.4 mg via INTRAVENOUS
  Filled 2017-04-24: qty 11

## 2017-04-24 MED ORDER — FENTANYL CITRATE (PF) 100 MCG/2ML IJ SOLN: 100.0000 ug | Freq: Once | INTRAMUSCULAR | Status: DC

## 2017-04-24 MED ORDER — ENOXAPARIN SODIUM 40 MG/0.4ML ~~LOC~~ SOLN: 40.0000 mg | SUBCUTANEOUS | Status: DC

## 2017-04-24 MED ORDER — MIDAZOLAM BOLUS VIA INFUSION
2.0000 mg | INTRAVENOUS | Status: DC | PRN
  Administered 2017-04-24 – 2017-04-26 (×3): 2 mg via INTRAVENOUS
  Administered 2017-04-27: 4 mg via INTRAVENOUS
  Filled 2017-04-24: qty 2

## 2017-04-24 MED ORDER — HEPARIN SODIUM (PORCINE) 5000 UNIT/ML IJ SOLN
5000.0000 [IU] | Freq: Three times a day (TID) | INTRAMUSCULAR | Status: DC
Start: 2017-04-24 — End: 2017-04-28
  Administered 2017-04-24 – 2017-04-28 (×11): 5000 [IU] via SUBCUTANEOUS
  Filled 2017-04-24 (×11): qty 1

## 2017-04-24 MED ORDER — ACETAMINOPHEN 325 MG PO TABS
650.0000 mg | ORAL_TABLET | Freq: Four times a day (QID) | ORAL | Status: DC | PRN
Start: 2017-04-24 — End: 2017-04-28

## 2017-04-24 MED ORDER — ETOMIDATE 2 MG/ML IV SOLN
INTRAVENOUS | Status: AC | PRN
  Administered 2017-04-24: 20 mg via INTRAVENOUS

## 2017-04-24 MED ORDER — SUCCINYLCHOLINE CHLORIDE 20 MG/ML IJ SOLN
INTRAMUSCULAR | Status: AC | PRN
  Administered 2017-04-24: 100 mg via INTRAVENOUS

## 2017-04-24 MED ORDER — SODIUM CHLORIDE 0.9 % IV SOLN
INTRAVENOUS | Status: DC
  Administered 2017-04-24: 20:00:00 via INTRAVENOUS

## 2017-04-24 MED ORDER — ASPIRIN 300 MG RE SUPP
300.0000 mg | RECTAL | Status: AC
  Administered 2017-04-25: 300 mg via RECTAL
  Filled 2017-04-24: qty 1

## 2017-04-24 MED ORDER — ONDANSETRON HCL 4 MG PO TABS: 4.0000 mg | ORAL_TABLET | Freq: Four times a day (QID) | ORAL | Status: DC | PRN

## 2017-04-24 MED ORDER — SODIUM CHLORIDE 0.9 % IV SOLN
INTRAVENOUS | Status: AC | PRN
  Administered 2017-04-24 (×2): 1000 mL via INTRAVENOUS

## 2017-04-24 MED ORDER — FENTANYL 2500MCG IN NS 250ML (10MCG/ML) PREMIX INFUSION
100.0000 ug/h | INTRAVENOUS | Status: DC
  Administered 2017-04-24: 100 ug/h via INTRAVENOUS
  Administered 2017-04-25 – 2017-04-26 (×3): 200 ug/h via INTRAVENOUS
  Administered 2017-04-27: 25 ug/h via INTRAVENOUS
  Administered 2017-04-28: 50 ug/h via INTRAVENOUS
  Filled 2017-04-24 (×6): qty 250

## 2017-04-24 MED ORDER — MIDAZOLAM HCL 2 MG/2ML IJ SOLN: 2.0000 mg | Freq: Once | INTRAMUSCULAR | Status: DC

## 2017-04-24 MED ORDER — ONDANSETRON HCL 4 MG/2ML IJ SOLN: 4.0000 mg | Freq: Four times a day (QID) | INTRAMUSCULAR | Status: DC | PRN

## 2017-04-24 MED ORDER — PIPERACILLIN-TAZOBACTAM 3.375 G IVPB 30 MIN
3.3750 g | Freq: Once | INTRAVENOUS | Status: AC
  Administered 2017-04-24: 3.375 g via INTRAVENOUS
  Filled 2017-04-24: qty 50

## 2017-04-24 MED ORDER — ATROPINE SULFATE 1 MG/10ML IJ SOSY
PREFILLED_SYRINGE | INTRAMUSCULAR | Status: AC
  Administered 2017-04-25: 1 mg via INTRAVENOUS
  Filled 2017-04-24: qty 10

## 2017-04-24 MED ORDER — SODIUM CHLORIDE 0.9 % IV SOLN: 100.0000 ug/h | INTRAVENOUS | Status: DC

## 2017-04-24 MED ORDER — SODIUM CHLORIDE 0.9 % IV SOLN
2.0000 mg/h | INTRAVENOUS | Status: DC
  Administered 2017-04-24: 2 mg/h via INTRAVENOUS
  Administered 2017-04-25 – 2017-04-28 (×5): 4 mg/h via INTRAVENOUS
  Filled 2017-04-24 (×7): qty 10

## 2017-04-24 MED ORDER — FENTANYL BOLUS VIA INFUSION
50.0000 ug | INTRAVENOUS | Status: DC | PRN
  Administered 2017-04-24: 50 ug via INTRAVENOUS
  Filled 2017-04-24: qty 50

## 2017-04-24 MED ORDER — CISATRACURIUM BOLUS VIA INFUSION
0.0500 mg/kg | INTRAVENOUS | Status: DC | PRN
  Filled 2017-04-24: qty 6

## 2017-04-24 MED ORDER — NOREPINEPHRINE BITARTRATE 1 MG/ML IV SOLN
0.0000 ug/min | INTRAVENOUS | Status: DC
  Administered 2017-04-24: 5 ug/min via INTRAVENOUS
  Administered 2017-04-25: 20 ug/min via INTRAVENOUS
  Filled 2017-04-24 (×3): qty 4

## 2017-04-24 MED ORDER — NOREPINEPHRINE BITARTRATE 1 MG/ML IV SOLN: 0.0000 ug/min | INTRAVENOUS | Status: DC

## 2017-04-24 MED ORDER — MAGNESIUM SULFATE 2 GM/50ML IV SOLN
2.0000 g | Freq: Once | INTRAVENOUS | Status: AC
  Administered 2017-04-25: 2 g via INTRAVENOUS
  Filled 2017-04-24: qty 50

## 2017-04-24 MED ORDER — VANCOMYCIN HCL IN DEXTROSE 1-5 GM/200ML-% IV SOLN
1000.0000 mg | Freq: Once | INTRAVENOUS | Status: AC
  Administered 2017-04-24: 1000 mg via INTRAVENOUS
  Filled 2017-04-24: qty 200

## 2017-04-24 MED ORDER — SODIUM CHLORIDE 0.9 % IV SOLN
1.0000 ug/kg/min | INTRAVENOUS | Status: DC
  Administered 2017-04-24 – 2017-04-25 (×2): 1 ug/kg/min via INTRAVENOUS
  Filled 2017-04-24 (×2): qty 20

## 2017-04-24 NOTE — ED Notes (Signed)
Date and time results received: 04/01/2017 6:05 PM   Test: Lactic Acid Critical Value: >10  Name of Provider Notified: Dr. Archie Balboa  Orders Received? Or Actions Taken?: Acknowledged

## 2017-04-24 NOTE — ED Provider Notes (Signed)
[facility] Emergency Department Provider Note   ____________________________________________   I have reviewed the triage vital signs and the nursing notes.   HISTORY  Chief Complaint Unresponsive  History limited by: Unresponsive, history obtained from EMS   HPI [name] is a 46 y.o. female who presents to the emergency department today via EMS after post arrest. EMS states that the patient is coming from a group home. Apparently the patient's roommate saw the patient collapsed. Patient was found to be pulseless. When fire department arrived AED recommended a shock. Shock was delivered however patient remained pulseless. EMS states that she was in V. fib when they arrived. They shocked her resulted in PEA. She then underwent 3 rounds of epinephrine before pulses returned. King airway was placed in the field. Blood sugar was 84 in the field.   Past Medical History:  Diagnosis Date  . Abnormal mammogram, unspecified 2013   Prev. cytology,hypercellular smears without evidence of malignant cells. The cytopathologist questioned if samples truly representative. Care taken during sampling and is felt to be representative.  . Acute renal failure (ARF) ([place])    per fl2  . Breast screening, unspecified 2013  . Broken leg    age 41  . Cellulitis   . Crohn's disease ([place]) 2013  . Depression   . Diabetes mellitus without complication (HCC)    non insulin dependent  . Early menopause   . Edema   . Encephalopathy    hepatic encephalopathy per fl2   . GERD (gastroesophageal reflux disease)   . Hiatal hernia 2013  . Hyperammonemia ([place])    per fl2  . Hyperlipidemia   . Hypertension   . Hypotension    per fl2  . Hypotension, chronic   . Hypothyroidism   . 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 Obesity, unspecified 2013  . Other sign and symptom in breast 2013   Right bst US,lower outer quadrant,A single 0.3x0.4x0.6cm hypoechoic  mass with slightly lobulated borders with adjacent 0.3x0.4x0.5cm mass was noted. The 1st was 5cm from nipple, 2nd at 8 cm from the nipple. The previous lesion aspirate was at 3 0'clock position. These lesions are thought to account for the mammographic abnormality. Minimal interval change on [place].   Marland Kitchen Recurrent cellulitis of lower leg   . Regional enteritis [facility])   . Rib fracture    age 76  . Scalp laceration    per fl2  . Schizophrenia ([place])   . 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 TBI (traumatic brain injury) ([place])   . Thyroid disease    hypothyroid  . Vitamin D deficiency     Patient Active Problem List   Diagnosis Date Noted  . Sepsis ([place]) 04/03/2017  . Elevated lactic acid level   . Hepatic encephalopathy ([place])   . Shock ([place])   . Hyperammonemia ([place]) 03/02/2017  . Sepsis secondary to UTI ([place]) 02/13/2017  . Hypotension 01/23/2017  . Scalp laceration   . Pressure injury of skin 10/02/2016  . 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 Acute renal failure (ARF) ([place])   . Respiratory failure ([place]) 09/27/2016  . Other sign and symptom in breast     Past Surgical History:  Procedure Laterality Date  . COLONOSCOPY WITH PROPOFOL N/A 02/16/2016   Procedure: COLONOSCOPY WITH PROPOFOL;  Surgeon: Lollie Sails, MD;  Location: [facility] ENDOSCOPY;  Service: Endoscopy;  Laterality: N/A;  . COLONOSCOPY WITH PROPOFOL N/A  02/22/2016   Procedure: COLONOSCOPY WITH PROPOFOL;  Surgeon: Lollie Sails, MD;  Location: [facility] ENDOSCOPY;  Service: Endoscopy;  Laterality: N/A;  . COLONOSCOPY WITH PROPOFOL N/A 10/14/2016   Procedure: COLONOSCOPY WITH PROPOFOL;  Surgeon: Jonathon Bellows, MD;  Location: [facility] ENDOSCOPY;  Service: Endoscopy;  Laterality: N/A;  . EYE SURGERY Left 2013   cataract surgery  . PERIPHERAL VASCULAR CATHETERIZATION N/A 10/13/2016   Procedure: Dialysis/Perma Catheter Insertion;  Surgeon: Algernon Huxley, MD;  Location: [place] CV LAB;  Service:  Cardiovascular;  Laterality: N/A;  . PERIPHERAL VASCULAR CATHETERIZATION N/A 10/18/2016   Procedure: Dialysis/Perma Catheter Removal;  Surgeon: Katha Cabal, MD;  Location: [place] CV LAB;  Service: Cardiovascular;  Laterality: N/A;  . TUMOR EXCISION Right    age of 51, tumor removed from [employer]    Prior to Admission medications   Medication Sig Start Date End Date Taking? Authorizing Provider  Amino Acids-Protein Hydrolys (FEEDING SUPPLEMENT, PRO-STAT SUGAR FREE 64,) LIQD Take 30 mLs by mouth 3 (three) times daily with meals.    [provider]  bisacodyl (DULCOLAX) 5 MG EC tablet Take 5 mg by mouth daily as needed for moderate constipation.    [provider]  calcium carbonate (OSCAL) 1500 (600 Ca) MG TABS tablet Take 1,500 mg by mouth 2 (two) times daily with a meal.    [provider]  Cholecalciferol (VITAMIN D3) 5000 units CAPS Take 5,000 Units by mouth daily.    [provider]  citalopram (CELEXA) 40 MG tablet Take 40 mg by mouth daily.    [provider]  diphenhydrAMINE (BENADRYL) 25 MG tablet Take 50 mg by mouth at bedtime as needed.    [provider]  diphenoxylate-atropine (LOMOTIL) 2.5-0.025 MG tablet Take 2 tablets by mouth every 2 (two) hours as needed for diarrhea or loose stools.    [provider]  feeding supplement, ENSURE ENLIVE, (ENSURE ENLIVE) LIQD Take 237 mLs by mouth 2 (two) times daily between meals. 03/07/17   Bettey Costa, MD  furosemide (LASIX) 40 MG tablet Take 1 tablet (40 mg total) by mouth daily. 03/07/17   Bettey Costa, MD  gemfibrozil (LOPID) 600 MG tablet Take 600 mg by mouth 2 (two) times daily before a meal.    [provider]  hyoscyamine (LEVSIN, ANASPAZ) 0.125 MG tablet Take 0.125 mg by mouth every 6 (six) hours as needed.    [provider]  lactulose (CHRONULAC) 10 GM/15ML solution Take 45 mLs (30 g total) by mouth 2 (two) times daily. 03/07/17   Bettey Costa, MD   levothyroxine (SYNTHROID, LEVOTHROID) 50 MCG tablet Take 1 tablet (50 mcg total) by mouth daily before breakfast. 03/08/17   Bettey Costa, MD  LORazepam (ATIVAN) 1 MG tablet Take 1 mg by mouth daily.    [provider]  magnesium oxide (MAGNESIUM-OXIDE) 400 (241.3 Mg) MG tablet Take 400 mg by mouth 2 (two) times daily.    [provider]  mesalamine (APRISO) 0.375 g 24 hr capsule Take 1,500 mg by mouth daily.    [provider]  midodrine (PROAMATINE) 10 MG tablet Take 10 mg by mouth 4 (four) times daily.    [provider]  Multiple Vitamin (MULTIVITAMIN WITH MINERALS) TABS tablet Take 1 tablet by mouth daily.    [provider]  omega-3 acid ethyl esters (LOVAZA) 1 g capsule Take 1 g by mouth daily.    [provider]  pantoprazole (PROTONIX) 40 MG tablet Take 40 mg by  mouth daily.    [provider]  potassium chloride (K-DUR,KLOR-CON) 10 MEQ tablet Take 10 mEq by mouth 2 (two) times daily.     [provider]  pseudoephedrine-guaifenesin (MUCINEX D) 60-600 MG 12 hr tablet Take 1 tablet by mouth every 12 (twelve) hours as needed for congestion.    [provider]  vitamin B-12 (CYANOCOBALAMIN) 1000 MCG tablet Take 1,000 mcg by mouth daily.    [provider]  vitamin C (ASCORBIC ACID) 250 MG tablet Take 500 mg by mouth daily.    [provider]    Allergies Peanut oil and Risperidone and related  Family History  Problem Relation Age of Onset  . Cancer Mother 37       colon  . Cancer Paternal Aunt     Social History Social History  Substance Use Topics  . Smoking status: Never Smoker  . Smokeless tobacco: Never Used  . Alcohol use No    Review of Systems Unable to obtain secondary to unresponsiveness  ____________________________________________   PHYSICAL EXAM:  VITAL SIGNS: ED Triage Vitals  Enc Vitals Group     BP 04/21/2017 1717 (!) 94/52     Pulse Rate 04/12/2017 1717 87      Resp 04/27/2017 1717 19     Temp --      Temp src --      SpO2 04/11/2017 1717 100 %     Weight 04/15/2017 1715 228 lb 6.4 oz (103.6 kg)     Height --     Constitutional: Unresponsive Eyes: Conjunctivae are normal.  ENT   Head: Normocephalic and atraumatic.   Nose: No congestion/rhinnorhea.   Mouth/Throat: Mucous membranes are moist. King airway in place   Neck: No stridor. Hematological/Lymphatic/Immunilogical: No cervical lymphadenopathy. Cardiovascular: Normal rate, regular rhythm.  No murmurs, rubs, or gallops.  Respiratory: King airway in place. Spontaneous breathing. Breath sounds symmetric and clear. Gastrointestinal: Soft and non tender. No rebound. No guarding.  Genitourinary: Deferred Musculoskeletal: Normal range of motion in all extremities. No lower extremity edema. Neurologic:  Pupils reactive but sluggish. Spontaneous breathing. Unresponsive.  Skin:  Skin is warm, dry and intact. No rash noted.   ____________________________________________    LABS (pertinent positives/negatives)  Labs Reviewed  COMPREHENSIVE METABOLIC PANEL - Abnormal; Notable for the following:       Result Value   Sodium 131 (*)    Potassium 2.9 (*)    Chloride 96 (*)    CO2 19 (*)    Glucose, Bld 125 (*)    BUN <5 (*)    Calcium 6.8 (*)    Total Protein 3.7 (*)    Albumin 1.2 (*)    AST 204 (*)    ALT 56 (*)    Alkaline Phosphatase 179 (*)    Anion gap 16 (*)    All other components within normal limits  LACTIC ACID, PLASMA - Abnormal; Notable for the following:    Lactic Acid, Venous 10.3 (*)    All other components within normal limits  CBC WITH DIFFERENTIAL/PLATELET - Abnormal; Notable for the following:    WBC 27.7 (*)    RBC 2.97 (*)    Hemoglobin 9.3 (*)    HCT 27.9 (*)    RDW 15.0 (*)    Neutro Abs 11.4 (*)    Lymphs Abs 15.7 (*)    All other components within normal limits  PROTIME-INR - Abnormal; Notable for the following:    Prothrombin Time 17.1 (*)  All other components within normal limits  URINALYSIS, COMPLETE (UACMP) WITH MICROSCOPIC - Abnormal; Notable for the following:    Color, Urine YELLOW (*)    APPearance CLOUDY (*)    Specific Gravity, Urine 1.004 (*)    Protein, ur 30 (*)    Bacteria, UA RARE (*)    Squamous Epithelial / LPF 0-5 (*)    All other components within normal limits  GLUCOSE, CAPILLARY - Abnormal; Notable for the following:    Glucose-Capillary 151 (*)    All other components within normal limits  ACETAMINOPHEN LEVEL - Abnormal; Notable for the following:    Acetaminophen (Tylenol), Serum <10 (*)    All other components within normal limits  BLOOD GAS, ARTERIAL - Abnormal; Notable for the following:    pH, Arterial 7.34 (*)    pO2, Arterial 115 (*)    Acid-base deficit 5.3 (*)    All other components within normal limits  CULTURE, BLOOD (ROUTINE X 2)  CULTURE, BLOOD (ROUTINE X 2)  SALICYLATE LEVEL  LACTIC ACID, PLASMA  TROPONIN I  TROPONIN I  TROPONIN I  APTT  APTT  PREGNANCY, URINE     ____________________________________________   EKG  I, Nance Pear, attending physician, personally viewed and interpreted this EKG  EKG Time: 1710 Rate: 93 Rhythm: sinus rhythm Axis: normal Intervals: qtc 535 QRS: low voltage ST changes: no st elevation Impression: abnormal ekg   ____________________________________________    RADIOLOGY  CT head   IMPRESSION:  No acute abnormality. Interval chronic right sphenoid and left  maxillary sinusitis.    CXR IMPRESSION:  1. No acute abnormality.  2. Right jugular catheter tip in the right atrium. This could be  retracted 3 cm to place in the inferior aspect of the superior vena  cava.    Abd x-ray IMPRESSION:  No acute abnormality. Nasogastric tube tip and side hole in the  proximal stomach.      ____________________________________________   PROCEDURES  Procedures  INTUBATION Performed by: Nance Pear  Required  items: required blood products, implants, devices, and special equipment available Patient identity confirmed: provided demographic data and hospital-assigned identification number Time out: Immediately prior to procedure a "time out" was called to verify the correct patient, procedure, equipment, support staff and site/side marked as required.  Indications: Unresponsive  Intubation method: Glidescope   Preoxygenation: King airway  Sedatives: Etomidate Paralytic: Succinylcholine  Tube Size: 7.5 cuffed  Post-procedure assessment: chest rise and ETCO2 monitor Breath sounds: equal and absent over the epigastrium Tube secured with: ETT holder Chest x-ray interpreted by radiologist and me.  Chest x-ray findings: endotracheal tube in appropriate position  Patient tolerated the procedure well with no immediate complications.   CENTRAL LINE Performed by: Nance Pear Consent: The procedure was performed in an emergent situation. Required items: required blood products, implants, devices, and special equipment available Patient identity confirmed: arm band and provided demographic data Time out: Immediately prior to procedure a "time out" was called to verify the correct patient, procedure, equipment, support staff and site/side marked as required. Indications: vascular access Anesthesia: local infiltration Local anesthetic: lidocaine 1% with epinephrine Anesthetic total: 3 ml Patient sedated: no Preparation: skin prepped with 2% chlorhexidine Skin prep agent dried: skin prep agent completely dried prior to procedure Sterile barriers: all five maximum sterile barriers used - cap, mask, sterile gown, sterile gloves, and large sterile sheet Hand hygiene: hand hygiene performed prior to central venous catheter insertion  Location details: Right IJ  Catheter type: triple lumen  Catheter size: 8 Fr Pre-procedure: landmarks identified Ultrasound guidance: yes Successful placement:  yes Post-procedure: line sutured and dressing applied Assessment: blood return through all parts, free fluid flow, placement verified by x-ray and no pneumothorax on x-ray Patient tolerance: Patient tolerated the procedure well with no immediate complications.  CRITICAL CARE Performed by: Nance Pear   Total critical care time: 55 minutes  Critical care time was exclusive of separately billable procedures and treating other patients.  Critical care was necessary to treat or prevent imminent or life-threatening deterioration.  Critical care was time spent personally by me on the following activities: development of treatment plan with patient and/or surrogate as well as nursing, discussions with consultants, evaluation of patient's response to treatment, examination of patient, obtaining history from patient or surrogate, ordering and performing treatments and interventions, ordering and review of laboratory studies, ordering and review of radiographic studies, pulse oximetry and re-evaluation of patient's condition.  ____________________________________________   INITIAL IMPRESSION / ASSESSMENT AND PLAN / ED COURSE  Pertinent labs & imaging results that were available during my care of the patient were reviewed by me and considered in my medical decision making (see chart for details).  Patient presented to the emergency department unresponsive. Patient did have pulses here in the emergency department pupils were reactive. Walker Shadow was switched out to an ET tube. Initially central line was placed in the right IJ. Patient was found to have elevated white count and lactic acid. Given concern for possible sepsis broad-spectrum antibiotics were started. Patient was discussed with ICU doctor for code ice. Will be admitted by hospitalist for the ICU.  ____________________________________________   FINAL CLINICAL IMPRESSION(S) / ED DIAGNOSES  Final diagnoses:  Cardiopulmonary arrest  ([place])  Elevated lactic acid level     Note: This dictation was prepared with Dragon dictation. Any transcriptional errors that result from this process are unintentional     Nance Pear, MD 04/05/2017 1946

## 2017-04-24 NOTE — ED Notes (Signed)
CODE  SEPSIS  CALLED  TO  THOMAS  AT  [facility] 

## 2017-04-24 NOTE — Code Documentation (Signed)
Per EMS staff, patient was shocked by fire department based on defibrillator suggestion and then patient was in V-fib when EMS arrived and CPR was in progress.  EMS gave another shock and then patient was in PEA and EMS gave 3 rounds of epinephrine prior to return of pulse.

## 2017-04-24 NOTE — Progress Notes (Signed)
Spoke with patient's legal guardian [name] and updated. Pt has no family per guardian. Call guardain for any updates or questions

## 2017-04-24 NOTE — H&P (Addendum)
[facility] NAME: [name]    MR#:  [id number]  DATE OF BIRTH:  03/15/1971  DATE OF ADMISSION:  04/13/2017  PRIMARY CARE PHYSICIAN: Remi Haggard, FNP   REQUESTING/REFERRING PHYSICIAN: Dr Archie Balboa  CHIEF COMPLAINT:   Patient found unresponsive at the group home. She underwent CPR with shock in the field. She is currently intubated on the ventilator. No family. Unable to provide any history or review of systems since patient is intubated HISTORY OF PRESENT ILLNESS:  [name]  is a 46 y.o. female with a known history of Traumatic brain injury, schizophrenia, hypothyroidism, depression, recent admission for bilateral lower extremity cellulitis comes from group home after she was found unresponsive unknown duration of time by her roommate. Per EMS staff patient was shocked by fire department based on defibrillator suggestion and then patient was in V. fib when EMS arrived and CPR was in progress. She EMS gave another shock and patient was in PEA and received 3 rounds of epinephrine prior to return of her pulse. Patient currently is intubated and on the ventilator.  -She received IV fluids, IV vancomycin, IV Zosyn  Her lactic acid is 10.4. White count is 27,000.  Patient is being admitted postcardiac arrest.  [facility] ICU attending aware of patient being admitted.   PAST MEDICAL HISTORY:   Past Medical History:  Diagnosis Date  . Abnormal mammogram, unspecified 2013   Prev. cytology,hypercellular smears without evidence of malignant cells. The cytopathologist questioned if samples truly representative. Care taken during sampling and is felt to be representative.  . Acute renal failure (ARF) ([place])    per fl2  . Breast screening, unspecified 2013  . Broken leg    age 36  . Cellulitis   . Crohn's disease ([place]) 2013  . Depression   . Diabetes mellitus without complication (HCC)    non insulin dependent  . Early  menopause   . Edema   . Encephalopathy    hepatic encephalopathy per fl2   . GERD (gastroesophageal reflux disease)   . Hiatal hernia 2013  . Hyperammonemia ([place])    per fl2  . Hyperlipidemia   . Hypertension   . Hypotension    per fl2  . Hypotension, chronic   . Hypothyroidism   . 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 Obesity, unspecified 2013  . Other sign and symptom in breast 2013   Right bst US,lower outer quadrant,A single 0.3x0.4x0.6cm hypoechoic mass with slightly lobulated borders with adjacent 0.3x0.4x0.5cm mass was noted. The 1st was 5cm from nipple, 2nd at 8 cm from the nipple. The previous lesion aspirate was at 3 0'clock position. These lesions are thought to account for the mammographic abnormality. Minimal interval change on [place].   Marland Kitchen Recurrent cellulitis of lower leg   . Regional enteritis [facility])   . Rib fracture    age 22  . Scalp laceration    per fl2  . Schizophrenia ([place])   . Schizophrenia, undifferentiated ([place])    per fl2  . TBI (traumatic brain injury) ([place])   . Thyroid disease    hypothyroid  . Vitamin D deficiency     PAST SURGICAL HISTOIRY:   Past Surgical History:  Procedure Laterality Date  . COLONOSCOPY WITH PROPOFOL N/A 02/16/2016   Procedure: COLONOSCOPY WITH PROPOFOL;  Surgeon: Lollie Sails, MD;  Location: [facility] ENDOSCOPY;  Service: Endoscopy;  Laterality: N/A;  . COLONOSCOPY  WITH PROPOFOL N/A 02/22/2016   Procedure: COLONOSCOPY WITH PROPOFOL;  Surgeon: Lollie Sails, MD;  Location: [facility] ENDOSCOPY;  Service: Endoscopy;  Laterality: N/A;  . COLONOSCOPY WITH PROPOFOL N/A 10/14/2016   Procedure: COLONOSCOPY WITH PROPOFOL;  Surgeon: Jonathon Bellows, MD;  Location: [facility] ENDOSCOPY;  Service: Endoscopy;  Laterality: N/A;  . EYE SURGERY Left 2013   cataract surgery  . PERIPHERAL VASCULAR CATHETERIZATION N/A 10/13/2016   Procedure: Dialysis/Perma Catheter Insertion;  Surgeon: Algernon Huxley, MD;  Location: [place] CV LAB;   Service: Cardiovascular;  Laterality: N/A;  . PERIPHERAL VASCULAR CATHETERIZATION N/A 10/18/2016   Procedure: Dialysis/Perma Catheter Removal;  Surgeon: Katha Cabal, MD;  Location: [place] CV LAB;  Service: Cardiovascular;  Laterality: N/A;  . TUMOR EXCISION Right    age of 58, tumor removed from [place]:   Social History  Substance Use Topics  . Smoking status: Never Smoker  . Smokeless tobacco: Never Used  . Alcohol use No    FAMILY HISTORY:   Family History  Problem Relation Age of Onset  . Cancer Mother 51       colon  . Cancer Paternal Aunt     DRUG ALLERGIES:   Allergies  Allergen Reactions  . Peanut Oil Other (See Comments)    Face turns red  . Risperidone And Related Cough    REVIEW OF SYSTEMS:  Review of Systems  Unable to perform ROS: Intubated     MEDICATIONS AT HOME:   Prior to Admission medications   Medication Sig Start Date End Date Taking? Authorizing Provider  Amino Acids-Protein Hydrolys (FEEDING SUPPLEMENT, PRO-STAT SUGAR FREE 64,) LIQD Take 30 mLs by mouth 3 (three) times daily with meals.   Yes [provider]  calcium carbonate (OSCAL) 1500 (600 Ca) MG TABS tablet Take 1,500 mg by mouth 2 (two) times daily with a meal.   Yes [provider]  citalopram (CELEXA) 40 MG tablet Take 40 mg by mouth daily.   Yes [provider]  furosemide (LASIX) 40 MG tablet Take 1 tablet (40 mg total) by mouth daily. 03/07/17  Yes Mody, Ulice Bold, MD  gemfibrozil (LOPID) 600 MG tablet Take 600 mg by mouth 2 (two) times daily before a meal.   Yes [provider]  lactulose (CHRONULAC) 10 GM/15ML solution Take 45 mLs (30 g total) by mouth 2 (two) times daily. 03/07/17  Yes Bettey Costa, MD  levothyroxine (SYNTHROID, LEVOTHROID) 50 MCG tablet Take 1 tablet (50 mcg total) by mouth daily before breakfast. 03/08/17  Yes Mody, Sital, MD  LORazepam (ATIVAN) 1 MG tablet Take 1 mg by mouth daily.   Yes [provider]  magnesium oxide (MAGNESIUM-OXIDE) 400 (241.3 Mg) MG tablet Take 400 mg by mouth 2 (two) times daily.   Yes [provider]  mesalamine (APRISO) 0.375 g 24 hr capsule Take 1,500 mg by mouth daily.   Yes [provider]  midodrine (PROAMATINE) 10 MG tablet Take 10 mg by mouth 4 (four) times daily.   Yes [provider]  Multiple Vitamin (MULTIVITAMIN WITH MINERALS) TABS tablet Take 1 tablet by mouth daily.   Yes [provider]  omega-3 acid ethyl esters (LOVAZA) 1 g capsule Take 1 g by mouth daily.   Yes [provider]  pantoprazole (PROTONIX) 40 MG tablet Take 40 mg by mouth daily.   Yes [provider]  potassium chloride (K-DUR,KLOR-CON) 10 MEQ tablet Take 20 mEq by mouth 2 (two) times  daily.    Yes [provider]  vitamin B-12 (CYANOCOBALAMIN) 1000 MCG tablet Take 1,000 mcg by mouth daily.   Yes [provider]  diphenhydrAMINE (BENADRYL) 25 MG tablet Take 50 mg by mouth at bedtime as needed.    [provider]  feeding supplement, ENSURE ENLIVE, (ENSURE ENLIVE) LIQD Take 237 mLs by mouth 2 (two) times daily between meals. Patient not taking: Reported on 04/16/2017 03/07/17   Bettey Costa, MD      VITAL SIGNS:  Blood pressure 91/72, pulse 89, temperature (!) 96.4 F (35.8 C), resp. rate 15, weight 103.6 kg (228 lb 6.4 oz), SpO2 100 %.  PHYSICAL EXAMINATION:  GENERAL:  46 y.o.-year-old patient lying in the bed with no acute distress. Morbidly obese  EYES: Pupils equal, round,  sluggish reactive to light and accommodation. No scleral icterus. Extraocular muscles intact.  HEENT: Head atraumatic, normocephalic. Oropharynx and nasopharynx clear. Intubated on the ventilator  NECK:  Supple, no jugular venous distention. No thyroid enlargement, no tenderness.  LUNGS:Decreased  breath sounds bilaterally, no wheezing, rales,rhonchi or crepitation. No use of accessory muscles of respiration.  CARDIOVASCULAR: S1, S2  normal. No murmurs, rubs, or gallops.  ABDOMEN: Soft, nontender, nondistended. Bowel sounds present. No organomegaly or mass.  EXTREMITIES: No pedal edema, cyanosis, or clubbing. Chronic venous stasis changes with chronic leg edema  NEUROLOGIC:Unable to assess patient intubated on the ventilator C: Intubated on the vent  SKIN: No obvious rash, lesion, or ulcer.   LABORATORY PANEL:   CBC  Recent Labs Lab 04/08/2017 1717  WBC 27.7*  HGB 9.3*  HCT 27.9*  PLT 356   ------------------------------------------------------------------------------------------------------------------  Chemistries   Recent Labs Lab 04/08/2017 1717  NA 131*  K 2.9*  CL 96*  CO2 19*  GLUCOSE 125*  BUN <5*  CREATININE 0.98  CALCIUM 6.8*  AST 204*  ALT 56*  ALKPHOS 179*  BILITOT 0.8   ------------------------------------------------------------------------------------------------------------------  Cardiac Enzymes No results for input(s): TROPONINI in the last 168 hours. ------------------------------------------------------------------------------------------------------------------  RADIOLOGY:  Ct Head Wo Contrast  Result Date: 04/23/2017 CLINICAL DATA:  Unresponsive following CPR. EXAM: CT HEAD WITHOUT CONTRAST TECHNIQUE: Contiguous axial images were obtained from the base of the skull through the vertex without intravenous contrast. COMPARISON:  03/02/2017. FINDINGS: Brain: No evidence of acute infarction, hemorrhage, hydrocephalus, extra-axial collection or mass lesion/mass effect. Vascular: No hyperdense vessel or unexpected calcification. Skull: Bilateral hyperostosis frontalis.  No acute abnormality. Sinuses/Orbits: Right sphenoid and left maxillary sinus mucosal thickening. Other: Previously noted scattered trichilemmal scalp cysts. IMPRESSION: No acute abnormality. 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 Electronically Signed   By: Claudie Revering M.D.   On: 04/01/2017 18:39    Dg Chest Portable 1 View  Result Date: 04/02/2017 CLINICAL DATA:  Status post CPR with intubation and central line placement. EXAM: PORTABLE CHEST 1 VIEW COMPARISON:  03/03/2017. FINDINGS: Poor inspiration. Normal sized heart. Clear lungs with normal vascularity. Endotracheal tube tip 2.3 cm above the carina. The right jugular catheter tip in the mid upper right atrium. Unremarkable bones. No pneumothorax. IMPRESSION: 1. No acute abnormality. 2. Right jugular catheter tip in the right atrium. This could be retracted 3 cm to place in the inferior aspect of the superior vena cava. Electronically Signed   By: Claudie Revering M.D.   On: 04/17/2017 18:32   Dg Abd Portable 1 View  Result Date: 04/05/2017 CLINICAL DATA:  Nasogastric tube placement. Status post CPR and intubation. EXAM: PORTABLE ABDOMEN - 1 VIEW COMPARISON:  09/27/2016.  FINDINGS: Nasogastric tube tip and side hole in the proximal stomach. Normal bowel gas pattern. Old posttraumatic changes involving the left twelfth rib without change. No acute bony abnormality. IMPRESSION: No acute abnormality. Nasogastric tube tip and side hole in the proximal stomach. Electronically Signed   By: Claudie Revering M.D.   On: 04/20/2017 18:33    EKG:    IMPRESSION AND PLAN:  [name]  is a 46 y.o. female with a known history of Traumatic brain injury, schizophrenia, hypothyroidism, depression, recent admission for bilateral lower extremity cellulitis comes from group home after she was found unresponsive unknown duration of time by her roommate. Per EMS staff patient was shocked by fire department based on defibrillator suggestion and then patient was in V. fib when EMS arrived and CPR was in progress. She EMS gave another shock and patient was in PEA and received 3 rounds of epinephrine prior to return of her pulse.  1. Acute cardiorespiratory arrest etiology unknown -Patient lives at a group home was found in her room unresponsive duration unknown by  roommate. She underwent CPR, shock, couple rounds of epinephrine. Patient currently intubated on the ventilator -Admit to ICU -Hypothermia protocol to be initiated. Dr Mortimer Fries to see pt and place orders for [place] -Cycle cardiac enzymes 3  2. Hypokalemia replete  3. Severe lactic acidosis secondary to #1  4. Leukocytosis -Infection source unknown at present. -Follow blood cultures. -Empiric vancomycin and Zosyn.  5. DVT prophylaxis subcutaneous heparin  I left a message for patient's legal guardian Ms. Filinger.   All the records are reviewed and case discussed with ED provider. Management plans discussed with the patient, family and they are in agreement.  CODE STATUS: full  TOTAL critical TIME TAKING CARE OF THIS PATIENT: *50* minutes.    [name] M.D on 04/17/2017 at 7:02 PM  Between 7am to 6pm - Pager - [phone]  After 6pm go to [email or web address] - password [email or web address] [facility]  [facility]  Office  [phone]  CC: Primary care physician; Remi Haggard, FNP

## 2017-04-24 NOTE — Consult Note (Signed)
PULMONARY / CRITICAL CARE MEDICINE   Name: [name] MRN: [id number] DOB: [birth year]    ADMISSION DATE:  04/04/2017 CONSULTATION DATE: 04/23/2017  REFERRING MD: Dr. Posey Pronto   CHIEF COMPLAINT: Cardiac Arrest   HISTORY OF PRESENT ILLNESS:   This is a 46 yo female with a PMH of Hypothyroidism, Traumatic Brain Injury, Schizophrenia, Regional Enteritis, Recurrent Bilateral Lower Extremity Cellulitis, Obesity, Chronic Hypotension, HTN, Hiatal Hernia, Hyperlipidemia, GERD, Hepatic Encephalopathy, Diabetes Mellitus, Hyperammonemia, Depression, Crohn's Disease, and Acute Renal Failure.  She presented to [facility] 06/25 post cardiac arrest from a group home.  Per ER notes the pts roommate found the pt unresponsive for an unknown duration of time, therefore EMS notified.  Per EMS staff fire department arrived prior to their arrival and defibrillator suggested cardiac rhythm should be shocked, therefore one shock administered.  Once EMS arrived the pt went into a Vfibb arrest and CPR was in progress by fire department.  She was shocked a 2nd time and then went into PEA arrest, therefore she received 3 rounds of epinephrine with ROSC, estimated total downtime 25 minutes. EMS placed a King airway in the field.  In the ER the pt was mechanically intubated.  Lab results revealed Na+ 131, K+ 2.9, lactic acid 10.3, wbc 27.7, and hgb 9.3. Pt admitted by hospitalist team to ICU for further workup and treatment. PCCM consulted for hypothermic protocol and vent management.   PAST MEDICAL HISTORY :  She  has a past medical history of Abnormal mammogram, unspecified (2013); Acute renal failure (ARF) ([place]); Breast screening, unspecified (2013); Broken leg; Cellulitis; Crohn's disease ([place]) (2013); Depression; Diabetes mellitus without complication ([place]); Early menopause; Edema; Encephalopathy; GERD (gastroesophageal reflux disease); Hiatal hernia (2013); Hyperammonemia ([place]); Hyperlipidemia; Hypertension; Hypotension;  Hypotension, chronic; Hypothyroidism; Mass, eye (1990); Obesity, unspecified (2013); Other sign and symptom in breast (2013); Recurrent cellulitis of lower leg; Regional enteritis ([place]); Rib fracture; Scalp laceration; Schizophrenia ([place]); Schizophrenia, undifferentiated ([place]); TBI (traumatic brain injury) ([place]); Thyroid disease; and Vitamin D deficiency.  PAST SURGICAL HISTORY: She  has a past surgical history that includes Tumor excision (Right); Eye surgery (Left, 2013); Colonoscopy with propofol (N/A, 02/16/2016); Colonoscopy with propofol (N/A, 02/22/2016); Cardiac catheterization (N/A, 10/13/2016); Colonoscopy with propofol (N/A, 10/14/2016); and Cardiac catheterization (N/A, 10/18/2016).  Allergies  Allergen Reactions  . Peanut Oil Other (See Comments)    Face turns red  . Risperidone And Related Cough    No current facility-administered medications on file prior to encounter.    Current Outpatient Prescriptions on File Prior to Encounter  Medication Sig  . Amino Acids-Protein Hydrolys (FEEDING SUPPLEMENT, PRO-STAT SUGAR FREE 64,) LIQD Take 30 mLs by mouth 3 (three) times daily with meals.  . calcium carbonate (OSCAL) 1500 (600 Ca) MG TABS tablet Take 1,500 mg by mouth 2 (two) times daily with a meal.  . citalopram (CELEXA) 40 MG tablet Take 40 mg by mouth daily.  . furosemide (LASIX) 40 MG tablet Take 1 tablet (40 mg total) by mouth daily.  Marland Kitchen gemfibrozil (LOPID) 600 MG tablet Take 600 mg by mouth 2 (two) times daily before a meal.  . lactulose (CHRONULAC) 10 GM/15ML solution Take 45 mLs (30 g total) by mouth 2 (two) times daily.  Marland Kitchen levothyroxine (SYNTHROID, LEVOTHROID) 50 MCG tablet Take 1 tablet (50 mcg total) by mouth daily before breakfast.  . LORazepam (ATIVAN) 1 MG tablet Take 1 mg by mouth daily.  . magnesium oxide (MAGNESIUM-OXIDE) 400 (241.3 Mg) MG tablet Take 400 mg by mouth 2 (two)  times daily.  . mesalamine (APRISO) 0.375 g 24 hr capsule Take 1,500 mg by mouth daily.  .  midodrine (PROAMATINE) 10 MG tablet Take 10 mg by mouth 4 (four) times daily.  . Multiple Vitamin (MULTIVITAMIN WITH MINERALS) TABS tablet Take 1 tablet by mouth daily.  Marland Kitchen omega-3 acid ethyl esters (LOVAZA) 1 g capsule Take 1 g by mouth daily.  . pantoprazole (PROTONIX) 40 MG tablet Take 40 mg by mouth daily.  . potassium chloride (K-DUR,KLOR-CON) 10 MEQ tablet Take 20 mEq by mouth 2 (two) times daily.   . vitamin B-12 (CYANOCOBALAMIN) 1000 MCG tablet Take 1,000 mcg by mouth daily.  . diphenhydrAMINE (BENADRYL) 25 MG tablet Take 50 mg by mouth at bedtime as needed.  . feeding supplement, ENSURE ENLIVE, (ENSURE ENLIVE) LIQD Take 237 mLs by mouth 2 (two) times daily between meals. (Patient not taking: Reported on 04/26/2017)    FAMILY HISTORY:  Her indicated that her mother is deceased. She indicated that her father is deceased. She indicated that the status of her paternal aunt is unknown.    SOCIAL HISTORY: She  reports that she has never smoked. She has never used smokeless tobacco. She reports that she does not drink alcohol or use drugs.  REVIEW OF SYSTEMS:   Unable to assess pt intubated   SUBJECTIVE:  Unable to assess pt intubated.   VITAL SIGNS: BP 91/72   Pulse 89   Temp (!) 96.4 F (35.8 C)   Resp 15   Wt 103.6 kg (228 lb 6.4 oz)   SpO2 100%   BMI 38.52 kg/m   HEMODYNAMICS:    VENTILATOR SETTINGS: Vent Mode: AC FiO2 (%):  [50 %] 50 % Set Rate:  [16 bmp] 16 bmp Vt Set:  [500 mL] 500 mL PEEP:  [5 cmH20] 5 cmH20  INTAKE / OUTPUT: I/O last 3 completed shifts: In: 2000 [I.V.:2000] Out: 10 [Urine:10]  PHYSICAL EXAMINATION: General: acutely ill appearing Caucasian female, mechanically intubated  Neuro: not following commands, PERRL HEENT: supple, no JVD Cardiovascular: s1s2, rrr, no M/R/G Lungs: course throughout, even, non labored Abdomen: +BS x4, soft, obese, non tender, non distended  Musculoskeletal: normal tone, 1+ bilateral lower extremity edema Skin:  chronic vascular skin changes bilateral lower extremities  LABS:  BMET  Recent Labs Lab 04/13/2017 1717  NA 131*  K 2.9*  CL 96*  CO2 19*  BUN <5*  CREATININE 0.98  GLUCOSE 125*    Electrolytes  Recent Labs Lab 04/11/2017 1717  CALCIUM 6.8*    CBC  Recent Labs Lab 04/17/2017 1510 04/27/2017 1717  WBC 17.2* 27.7*  HGB 10.4* 9.3*  HCT 30.7* 27.9*  PLT 442* 356    Coag's  Recent Labs Lab 04/07/2017 1717  INR 1.38    Sepsis Markers  Recent Labs Lab 04/20/2017 1717  LATICACIDVEN 10.3*    ABG  Recent Labs Lab 04/15/2017 1850  PHART 7.34*  PCO2ART 37  PO2ART 115*    Liver Enzymes  Recent Labs Lab 04/29/2017 1717  AST 204*  ALT 56*  ALKPHOS 179*  BILITOT 0.8  ALBUMIN 1.2*    Cardiac Enzymes No results for input(s): TROPONINI, PROBNP in the last 168 hours.  Glucose  Recent Labs Lab 04/05/2017 1710  GLUCAP 151*    Imaging Ct Head Wo Contrast  Result Date: 04/23/2017 CLINICAL DATA:  Unresponsive following CPR. EXAM: CT HEAD WITHOUT CONTRAST TECHNIQUE: Contiguous axial images were obtained from the base of the skull through the vertex without intravenous contrast. COMPARISON:  03/02/2017.  FINDINGS: Brain: No evidence of acute infarction, hemorrhage, hydrocephalus, extra-axial collection or mass lesion/mass effect. Vascular: No hyperdense vessel or unexpected calcification. Skull: Bilateral hyperostosis frontalis.  No acute abnormality. Sinuses/Orbits: Right sphenoid and left maxillary sinus mucosal thickening. Other: Previously noted scattered trichilemmal scalp cysts. IMPRESSION: No acute abnormality. 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 Electronically Signed   By: Claudie Revering M.D.   On: 04/04/2017 18:39   Dg Chest Portable 1 View  Result Date: 04/15/2017 CLINICAL DATA:  Status post CPR with intubation and central line placement. EXAM: PORTABLE CHEST 1 VIEW COMPARISON:  03/03/2017. FINDINGS: Poor inspiration. Normal sized  heart. Clear lungs with normal vascularity. Endotracheal tube tip 2.3 cm above the carina. The right jugular catheter tip in the mid upper right atrium. Unremarkable bones. No pneumothorax. IMPRESSION: 1. No acute abnormality. 2. Right jugular catheter tip in the right atrium. This could be retracted 3 cm to place in the inferior aspect of the superior vena cava. Electronically Signed   By: Claudie Revering M.D.   On: 04/26/2017 18:32   Dg Abd Portable 1 View  Result Date: 04/28/2017 CLINICAL DATA:  Nasogastric tube placement. Status post CPR and intubation. EXAM: PORTABLE ABDOMEN - 1 VIEW COMPARISON:  09/27/2016. FINDINGS: Nasogastric tube tip and side hole in the proximal stomach. Normal bowel gas pattern. Old posttraumatic changes involving the left twelfth rib without change. No acute bony abnormality. IMPRESSION: No acute abnormality. Nasogastric tube tip and side hole in the proximal stomach. Electronically Signed   By: Claudie Revering M.D.   On: 03/31/2017 18:33   STUDIES:  CT Head 06/25>>No acute abnormality. 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  CULTURES: Blood 06/25>> Urine 06/25>>  ANTIBIOTICS: Vancomycin 06/25 x1 dose Zosyn 06/25 x1 dose   SIGNIFICANT EVENTS: 06/25-Pt admitted to ICU post cardiac arrest mechanically intubated.   LINES/TUBES: Right IJ 06/25>> ETT 06/25>>  ASSESSMENT / PLAN:  PULMONARY A: Acute cardiorespiratory arrest of unknown etiology  Mechanical Intubation  Hx: Obesity  P:   Full vent support  Hypothermia protocol @33  C  VAP bundle  Repeat ABG and CXR in am   CARDIOVASCULAR A:  Acute cardiorespiratory arrest (Vfibb and PEA arrest) Hx: HTN, Chronic hypotension, and Hyperlipidemia P:  Cardiology consulted appreciate input-per cardiology no indication for emergent cardiac catheterization at this time.    Trend troponin's Prn levophed gtt to maintain map greater or equal to 80 mmHg during hypothermia protocol  Stat EKG   Continuous telemetry monitoring  RENAL A:   Hypokalemia  Severe Lactic Acidosis  Hx: Acute renal failure  P:   Trend CMP Replace electrolytes as indicated Monitor UOP Trend lactic acid  CVP's q4hr NS @100  ml/hr   GASTROINTESTINAL A:   Elevated liver enzymes Hx: Regional enteritis, GERD, Chr and Hiatal Hernia  P:   Keep NPO for now  Trend liver enzymes Pepcid for PUD prophylaxis   HEMATOLOGIC A:   Anemia without acute blood loss  P:  Trend CBC Subq heparin for VTE prophylaxis  Transfuse for hgb <7 Trend APTT per hypothermic protocol  Monitor for s/sx of bleeding   INFECTIOUS A:   Leukocytosis  P:   Trend WBC and monitor fever curve Trend lactic acid and PCT Follow cultures If PCT elevated will start empiric abx  ENDOCRINE A:   Diabetes Mellitus Hx: Hypothyroidism  P:   CBG's q1hr during hypothermic protocol  Thyroid panel with TSH pending   NEUROLOGIC A:   Hepatic Encephalopathy  Hx: Schizophrenia,  Depression, Traumatic Brain Injury, Hyperammonemia, and Encephalopathy  P:   RASS goal: -5 for duration of neuromuscular blockade therapy  Nimbex, Versed, and Fentanyl gtts to maintain RASS goal  Stat ammonia level WUA post hypothermia protocol completion  EEG pending   FAMILY  - Updates: No family at bedside to update 04/07/2017  - Inter-disciplinary family meet or Palliative Care meeting due by: 05/01/2017   Marda Stalker, [place] Pager [phone] (please enter 7 digits) PCCM Consult Pager [phone] (please enter 7 digits)

## 2017-04-24 NOTE — Procedures (Signed)
Arterial Catheter Insertion Procedure Note [name] [id number] [birth year]   Procedure: Insertion of Arterial Catheter  Indications: Blood pressure monitoring and Frequent blood sampling   Procedure Details Consent: Risks of procedure as well as the alternatives and risks of each were explained to the (patient/caregiver).  Consent for procedure obtained. Time Out: Verified patient identification, verified procedure, site/side was marked, verified correct patient position, special equipment/implants available, medications/allergies/relevent history reviewed, required imaging and test results available.  Performed  Maximum sterile technique was used including antiseptics, cap, gloves, gown, hand hygiene, mask and sheet. Skin prep: Chlorhexidine; local anesthetic administered 18 gauge catheter was inserted into right femoral artery using the Seldinger technique.  Evaluation Blood flow good; BP tracing good. Complications: No apparent complications.   Right femoral arterial line inserted utilizing ultrasound no complications noted or following procedure.  Marda Stalker, [place] Pager [phone] (please enter 7 digits) PCCM Consult Pager [phone] (please enter 7 digits)

## 2017-04-24 NOTE — ED Notes (Signed)
Code ice implemented at this time.

## 2017-04-25 ENCOUNTER — Inpatient Hospital Stay: Payer: Medicare Other

## 2017-04-25 DIAGNOSIS — I4901 Ventricular fibrillation: Principal | ICD-10-CM

## 2017-04-25 DIAGNOSIS — K7290 Hepatic failure, unspecified without coma: Secondary | ICD-10-CM

## 2017-04-25 DIAGNOSIS — R7989 Other specified abnormal findings of blood chemistry: Secondary | ICD-10-CM

## 2017-04-25 DIAGNOSIS — R9431 Abnormal electrocardiogram [ECG] [EKG]: Secondary | ICD-10-CM

## 2017-04-25 DIAGNOSIS — R748 Abnormal levels of other serum enzymes: Secondary | ICD-10-CM

## 2017-04-25 DIAGNOSIS — R778 Other specified abnormalities of plasma proteins: Secondary | ICD-10-CM

## 2017-04-25 DIAGNOSIS — J9601 Acute respiratory failure with hypoxia: Secondary | ICD-10-CM

## 2017-04-25 DIAGNOSIS — S069XAA Unspecified intracranial injury with loss of consciousness status unknown, initial encounter: Secondary | ICD-10-CM

## 2017-04-25 DIAGNOSIS — S069X9A Unspecified intracranial injury with loss of consciousness of unspecified duration, initial encounter: Secondary | ICD-10-CM

## 2017-04-25 DIAGNOSIS — I469 Cardiac arrest, cause unspecified: Secondary | ICD-10-CM

## 2017-04-25 DIAGNOSIS — E876 Hypokalemia: Secondary | ICD-10-CM | POA: Diagnosis present

## 2017-04-25 DIAGNOSIS — IMO0002 Reserved for concepts with insufficient information to code with codable children: Secondary | ICD-10-CM

## 2017-04-25 DIAGNOSIS — N179 Acute kidney failure, unspecified: Secondary | ICD-10-CM

## 2017-04-25 LAB — BASIC METABOLIC PANEL
ANION GAP: 11 (ref 5–15)
ANION GAP: 8 (ref 5–15)
ANION GAP: 7 (ref 5–15)
ANION GAP: 8 (ref 5–15)
ANION GAP: 8 (ref 5–15)
Anion gap: 8 (ref 5–15)
BUN: <5 mg/dL — ABNORMAL LOW (ref 6–20)
BUN: <5 mg/dL — ABNORMAL LOW (ref 6–20)
BUN: <5 mg/dL — ABNORMAL LOW (ref 6–20)
BUN: <5 mg/dL — ABNORMAL LOW (ref 6–20)
BUN: 5 mg/dL — ABNORMAL LOW (ref 6–20)
BUN: 5 mg/dL — ABNORMAL LOW (ref 6–20)
CALCIUM: 6.7 mg/dL — AB (ref 8.9–10.3)
CALCIUM: 6.7 mg/dL — AB (ref 8.9–10.3)
CALCIUM: 6.8 mg/dL — AB (ref 8.9–10.3)
CALCIUM: 6.5 mg/dL — AB (ref 8.9–10.3)
CHLORIDE: 102 mmol/L (ref 101–111)
CO2: 22 mmol/L (ref 22–32)
CO2: 24 mmol/L (ref 22–32)
CO2: 23 mmol/L (ref 22–32)
CO2: 21 mmol/L — AB (ref 22–32)
CO2: 18 mmol/L — ABNORMAL LOW (ref 22–32)
CO2: 19 mmol/L — ABNORMAL LOW (ref 22–32)
Calcium: 6.8 mg/dL — ABNORMAL LOW (ref 8.9–10.3)
Calcium: 6.5 mg/dL — ABNORMAL LOW (ref 8.9–10.3)
Chloride: 99 mmol/L — ABNORMAL LOW (ref 101–111)
Chloride: 104 mmol/L (ref 101–111)
Chloride: 106 mmol/L (ref 101–111)
Chloride: 109 mmol/L (ref 101–111)
Chloride: 108 mmol/L (ref 101–111)
Creatinine, Ser: 0.81 mg/dL (ref 0.44–1.00)
Creatinine, Ser: 0.98 mg/dL (ref 0.44–1.00)
Creatinine, Ser: 0.98 mg/dL (ref 0.44–1.00)
Creatinine, Ser: 0.87 mg/dL (ref 0.44–1.00)
Creatinine, Ser: 0.95 mg/dL (ref 0.44–1.00)
Creatinine, Ser: 0.9 mg/dL (ref 0.44–1.00)
GFR calc non Af Amer: >60 mL/min (ref >60)
GFR calc non Af Amer: >60 mL/min (ref >60)
Glucose, Bld: 201 mg/dL — ABNORMAL HIGH (ref 65–99)
Glucose, Bld: 215 mg/dL — ABNORMAL HIGH (ref 65–99)
Glucose, Bld: 140 mg/dL — ABNORMAL HIGH (ref 65–99)
Glucose, Bld: 159 mg/dL — ABNORMAL HIGH (ref 65–99)
Glucose, Bld: 132 mg/dL — ABNORMAL HIGH (ref 65–99)
Glucose, Bld: 152 mg/dL — ABNORMAL HIGH (ref 65–99)
POTASSIUM: 2.5 mmol/L — AB (ref 3.5–5.1)
Potassium: 2.8 mmol/L — ABNORMAL LOW (ref 3.5–5.1)
Potassium: 3 mmol/L — ABNORMAL LOW (ref 3.5–5.1)
Potassium: 3.8 mmol/L (ref 3.5–5.1)
Potassium: 3.5 mmol/L (ref 3.5–5.1)
Potassium: 3.7 mmol/L (ref 3.5–5.1)
SODIUM: 132 mmol/L — AB (ref 135–145)
SODIUM: 134 mmol/L — AB (ref 135–145)
SODIUM: 135 mmol/L (ref 135–145)
Sodium: 135 mmol/L (ref 135–145)
Sodium: 134 mmol/L — ABNORMAL LOW (ref 135–145)
Sodium: 135 mmol/L (ref 135–145)

## 2017-04-25 LAB — GLUCOSE, CAPILLARY
GLUCOSE-CAPILLARY: 129 mg/dL — AB (ref 65–99)
GLUCOSE-CAPILLARY: 130 mg/dL — AB (ref 65–99)
GLUCOSE-CAPILLARY: 132 mg/dL — AB (ref 65–99)
GLUCOSE-CAPILLARY: 133 mg/dL — AB (ref 65–99)
GLUCOSE-CAPILLARY: 133 mg/dL — AB (ref 65–99)
GLUCOSE-CAPILLARY: 141 mg/dL — AB (ref 65–99)
GLUCOSE-CAPILLARY: 145 mg/dL — AB (ref 65–99)
GLUCOSE-CAPILLARY: 172 mg/dL — AB (ref 65–99)
GLUCOSE-CAPILLARY: 187 mg/dL — AB (ref 65–99)
GLUCOSE-CAPILLARY: 213 mg/dL — AB (ref 65–99)
GLUCOSE-CAPILLARY: 240 mg/dL — AB (ref 65–99)
Glucose-Capillary: 220 mg/dL — ABNORMAL HIGH (ref 65–99)
Glucose-Capillary: 200 mg/dL — ABNORMAL HIGH (ref 65–99)
Glucose-Capillary: 173 mg/dL — ABNORMAL HIGH (ref 65–99)
Glucose-Capillary: 154 mg/dL — ABNORMAL HIGH (ref 65–99)
Glucose-Capillary: 154 mg/dL — ABNORMAL HIGH (ref 65–99)
Glucose-Capillary: 130 mg/dL — ABNORMAL HIGH (ref 65–99)
Glucose-Capillary: 159 mg/dL — ABNORMAL HIGH (ref 65–99)
Glucose-Capillary: 151 mg/dL — ABNORMAL HIGH (ref 65–99)
Glucose-Capillary: 129 mg/dL — ABNORMAL HIGH (ref 65–99)
Glucose-Capillary: 127 mg/dL — ABNORMAL HIGH (ref 65–99)
Glucose-Capillary: 145 mg/dL — ABNORMAL HIGH (ref 65–99)
Glucose-Capillary: 156 mg/dL — ABNORMAL HIGH (ref 65–99)

## 2017-04-25 LAB — BLOOD CULTURE ID PANEL (REFLEXED)
ACINETOBACTER BAUMANNII: NOT DETECTED
CANDIDA ALBICANS: NOT DETECTED
CANDIDA TROPICALIS: NOT DETECTED
Candida glabrata: NOT DETECTED
Candida krusei: NOT DETECTED
Candida parapsilosis: NOT DETECTED
ENTEROBACTERIACEAE SPECIES: NOT DETECTED
Enterobacter cloacae complex: NOT DETECTED
Enterococcus species: NOT DETECTED
Escherichia coli: NOT DETECTED
HAEMOPHILUS INFLUENZAE: NOT DETECTED
KLEBSIELLA PNEUMONIAE: NOT DETECTED
Klebsiella oxytoca: NOT DETECTED
Listeria monocytogenes: NOT DETECTED
METHICILLIN RESISTANCE: DETECTED — AB
NEISSERIA MENINGITIDIS: NOT DETECTED
Proteus species: NOT DETECTED
Pseudomonas aeruginosa: NOT DETECTED
STREPTOCOCCUS PYOGENES: NOT DETECTED
STREPTOCOCCUS SPECIES: NOT DETECTED
Serratia marcescens: NOT DETECTED
Staphylococcus aureus (BCID): NOT DETECTED
Staphylococcus species: DETECTED — AB
Streptococcus agalactiae: NOT DETECTED
Streptococcus pneumoniae: NOT DETECTED

## 2017-04-25 LAB — TROPONIN I
TROPONIN I: 0.13 ng/mL — AB (ref <0.03)
TROPONIN I: 0.14 ng/mL — AB (ref <0.03)
Troponin I: 0.17 ng/mL (ref <0.03)

## 2017-04-25 LAB — BLOOD GAS, ARTERIAL
Acid-base deficit: 5.2 mmol/L — ABNORMAL HIGH (ref 0.0–2.0)
Bicarbonate: 18.8 mmol/L — ABNORMAL LOW (ref 20.0–28.0)
FIO2: 0.3
LHR: 14 {breaths}/min
O2 Saturation: 98.7 %
PEEP: 5 cmH2O
PO2 ART: 121 mmHg — AB (ref 83.0–108.0)
Patient temperature: 37
VT: 500 mL
pCO2 arterial: 31 mmHg — ABNORMAL LOW (ref 32.0–48.0)
pH, Arterial: 7.39 (ref 7.350–7.450)

## 2017-04-25 LAB — COMPREHENSIVE METABOLIC PANEL
ALBUMIN: 1.3 g/dL — AB (ref 3.5–5.0)
ALT: 62 U/L — ABNORMAL HIGH (ref 14–54)
ANION GAP: 9 (ref 5–15)
AST: 178 U/L — ABNORMAL HIGH (ref 15–41)
Alkaline Phosphatase: 211 U/L — ABNORMAL HIGH (ref 38–126)
BUN: 5 mg/dL — ABNORMAL LOW (ref 6–20)
CO2: 22 mmol/L (ref 22–32)
Calcium: 6.7 mg/dL — ABNORMAL LOW (ref 8.9–10.3)
Chloride: 103 mmol/L (ref 101–111)
Creatinine, Ser: 1.14 mg/dL — ABNORMAL HIGH (ref 0.44–1.00)
GFR calc Af Amer: >60 mL/min (ref >60)
GFR calc non Af Amer: 57 mL/min — ABNORMAL LOW (ref >60)
GLUCOSE: 126 mg/dL — AB (ref 65–99)
Potassium: 3 mmol/L — ABNORMAL LOW (ref 3.5–5.1)
SODIUM: 134 mmol/L — AB (ref 135–145)
TOTAL PROTEIN: 4.3 g/dL — AB (ref 6.5–8.1)
Total Bilirubin: 0.9 mg/dL (ref 0.3–1.2)

## 2017-04-25 LAB — APTT
aPTT: 39 seconds — ABNORMAL HIGH (ref 24–36)
aPTT: 46 seconds — ABNORMAL HIGH (ref 24–36)

## 2017-04-25 LAB — PHOSPHORUS: PHOSPHORUS: 3.1 mg/dL (ref 2.5–4.6)

## 2017-04-25 LAB — PROTIME-INR
INR: 1.18
PROTHROMBIN TIME: 15.1 s (ref 11.4–15.2)

## 2017-04-25 LAB — MAGNESIUM: Magnesium: 1.9 mg/dL (ref 1.7–2.4)

## 2017-04-25 LAB — PROCALCITONIN: Procalcitonin: 1.5 ng/mL

## 2017-04-25 MED ORDER — POTASSIUM CHLORIDE 10 MEQ/50ML IV SOLN
10.0000 meq | INTRAVENOUS | Status: AC
  Administered 2017-04-25 (×2): 10 meq via INTRAVENOUS
  Filled 2017-04-25 (×2): qty 50

## 2017-04-25 MED ORDER — SODIUM CHLORIDE 0.9 % IV BOLUS (SEPSIS)
1000.0000 mL | Freq: Once | INTRAVENOUS | Status: AC
  Administered 2017-04-25: 1000 mL via INTRAVENOUS

## 2017-04-25 MED ORDER — POTASSIUM CHLORIDE 10 MEQ/50ML IV SOLN
10.0000 meq | INTRAVENOUS | Status: AC
  Administered 2017-04-25 (×2): 10 meq via INTRAVENOUS
  Filled 2017-04-25 (×4): qty 50

## 2017-04-25 MED ORDER — SODIUM CHLORIDE 0.9% FLUSH
10.0000 mL | Freq: Two times a day (BID) | INTRAVENOUS | Status: DC
  Administered 2017-04-25: 10 mL
  Administered 2017-04-25 – 2017-04-26 (×2): 30 mL
  Administered 2017-04-27 – 2017-04-28 (×3): 10 mL

## 2017-04-25 MED ORDER — PIPERACILLIN-TAZOBACTAM 3.375 G IVPB
3.3750 g | Freq: Three times a day (TID) | INTRAVENOUS | Status: DC
  Administered 2017-04-25 – 2017-04-28 (×11): 3.375 g via INTRAVENOUS
  Filled 2017-04-25 (×13): qty 50

## 2017-04-25 MED ORDER — POTASSIUM CHLORIDE IN NACL 40-0.9 MEQ/L-% IV SOLN
INTRAVENOUS | Status: DC
  Administered 2017-04-25 – 2017-04-26 (×4): 100 mL/h via INTRAVENOUS
  Filled 2017-04-25 (×6): qty 1000

## 2017-04-25 MED ORDER — VITAL HIGH PROTEIN PO LIQD
1000.0000 mL | ORAL | Status: DC
  Administered 2017-04-25 (×4)
  Administered 2017-04-25: 1000 mL
  Administered 2017-04-25 – 2017-04-26 (×5)
  Administered 2017-04-26: 1000 mL
  Administered 2017-04-26 (×2)

## 2017-04-25 MED ORDER — NOREPINEPHRINE BITARTRATE 1 MG/ML IV SOLN
0.0000 ug/min | INTRAVENOUS | Status: DC
  Administered 2017-04-25: 45 ug/min via INTRAVENOUS
  Administered 2017-04-25: 26 ug/min via INTRAVENOUS
  Administered 2017-04-25: 40 ug/min via INTRAVENOUS
  Administered 2017-04-26: 50 ug/min via INTRAVENOUS
  Administered 2017-04-26: 80 ug/min via INTRAVENOUS
  Administered 2017-04-26: 55 ug/min via INTRAVENOUS
  Administered 2017-04-26: 70 ug/min via INTRAVENOUS
  Administered 2017-04-26: 55 ug/min via INTRAVENOUS
  Administered 2017-04-26: 80 ug/min via INTRAVENOUS
  Administered 2017-04-27: 44 ug/min via INTRAVENOUS
  Administered 2017-04-27: 46 ug/min via INTRAVENOUS
  Administered 2017-04-27: 48 ug/min via INTRAVENOUS
  Administered 2017-04-27: 42 ug/min via INTRAVENOUS
  Administered 2017-04-27: 50 ug/min via INTRAVENOUS
  Administered 2017-04-27: 46 ug/min via INTRAVENOUS
  Administered 2017-04-27: 44 ug/min via INTRAVENOUS
  Administered 2017-04-28 (×2): 50 ug/min via INTRAVENOUS
  Filled 2017-04-25 (×17): qty 16

## 2017-04-25 MED ORDER — MAGNESIUM SULFATE 2 GM/50ML IV SOLN
2.0000 g | Freq: Once | INTRAVENOUS | Status: AC
  Administered 2017-04-25: 2 g via INTRAVENOUS
  Filled 2017-04-25: qty 50

## 2017-04-25 MED ORDER — ORAL CARE MOUTH RINSE
15.0000 mL | OROMUCOSAL | Status: DC
  Administered 2017-04-25 – 2017-04-28 (×33): 15 mL via OROMUCOSAL

## 2017-04-25 MED ORDER — CHLORHEXIDINE GLUCONATE 0.12% ORAL RINSE (MEDLINE KIT)
15.0000 mL | Freq: Two times a day (BID) | OROMUCOSAL | Status: DC
  Administered 2017-04-25 – 2017-04-28 (×7): 15 mL via OROMUCOSAL

## 2017-04-25 MED ORDER — INSULIN ASPART 100 UNIT/ML ~~LOC~~ SOLN
1.0000 [IU] | SUBCUTANEOUS | Status: DC
  Administered 2017-04-25 – 2017-04-27 (×8): 1 [IU] via SUBCUTANEOUS
  Filled 2017-04-25 (×8): qty 1

## 2017-04-25 MED ORDER — INSULIN GLARGINE 100 UNIT/ML ~~LOC~~ SOLN
10.0000 [IU] | SUBCUTANEOUS | Status: DC
  Administered 2017-04-25 – 2017-04-27 (×3): 10 [IU] via SUBCUTANEOUS
  Filled 2017-04-25 (×4): qty 0.1

## 2017-04-25 MED ORDER — SODIUM CHLORIDE 0.9 % IV SOLN
INTRAVENOUS | Status: DC
  Administered 2017-04-25: 1.8 [IU]/h via INTRAVENOUS
  Filled 2017-04-25: qty 1

## 2017-04-25 MED ORDER — DEXTROSE 10 % IV SOLN: INTRAVENOUS | Status: DC | PRN

## 2017-04-25 MED ORDER — ATROPINE SULFATE 1 MG/10ML IJ SOSY
1.0000 mg | PREFILLED_SYRINGE | INTRAMUSCULAR | Status: DC | PRN
  Administered 2017-04-25: 1 mg via INTRAVENOUS
  Filled 2017-04-25: qty 10

## 2017-04-25 MED ORDER — VASOPRESSIN 20 UNIT/ML IV SOLN
0.0400 [IU]/min | INTRAVENOUS | Status: DC
  Administered 2017-04-25: 0.03 [IU]/min via INTRAVENOUS
  Administered 2017-04-26 – 2017-04-28 (×4): 0.04 [IU]/min via INTRAVENOUS
  Filled 2017-04-25 (×6): qty 2

## 2017-04-25 MED ORDER — SODIUM CHLORIDE 0.9% FLUSH: 10.0000 mL | INTRAVENOUS | Status: DC | PRN

## 2017-04-25 MED ORDER — POLYVINYL ALCOHOL 1.4 % OP SOLN
1.0000 [drp] | OPHTHALMIC | Status: DC
  Administered 2017-04-25 – 2017-04-26 (×10): 1 [drp] via OPHTHALMIC
  Filled 2017-04-25: qty 15

## 2017-04-25 NOTE — Care Management Note (Addendum)
Case Management Note  Patient Details  Name: [name] MRN: [id number] Date of Birth: 1971-03-06  Subjective/Objective:                  Patient resides at [facility] group home located at [address] [place], [place] [address], phone [phone] fax [phone]. Open to [facility] HH. Guardian: Lousie Fillinger who lives in [place] and who is her Aunt.  Her contact numbers are [phone] and [phone].   Action/Plan: This RNCM has notified [place] with [facility] of patient's admission.   Expected Discharge Date:                  Expected Discharge Plan:     In-House Referral:  Clinical Social Work  Discharge planning Services  CM Consult  Post Acute Care Choice:  Home Health Choice offered to:     DME Arranged:    DME Agency:     HH Arranged:    [place] Agency:     Status of Service:  In process, will continue to follow  If discussed at Long Length of Stay Meetings, dates discussed:    Additional Comments:  Marshell Garfinkel, RN 04/25/2017, 8:05 AM

## 2017-04-25 NOTE — Progress Notes (Signed)
PULMONARY / CRITICAL CARE MEDICINE   Name: [name] MRN: [id number] DOB: [birth year]    ADMISSION DATE:  04/04/2017 CONSULTATION DATE: 04/23/2017  REFERRING MD: Dr. Posey Pronto   CHIEF COMPLAINT: Cardiac Arrest   HISTORY OF PRESENT ILLNESS:   This is a 46 yo female with a PMH of Hypothyroidism, Traumatic Brain Injury, Schizophrenia, Regional Enteritis, Recurrent Bilateral Lower Extremity Cellulitis, Obesity, Chronic Hypotension, HTN, Hiatal Hernia, Hyperlipidemia, GERD, Hepatic Encephalopathy, Diabetes Mellitus, Hyperammonemia, Depression, Crohn's Disease, and Acute Renal Failure.  She presented to [facility] 06/25 post cardiac arrest from a group home.  Per ER notes the pts roommate found the pt unresponsive for an unknown duration of time, therefore EMS notified.  Per EMS staff fire department arrived prior to their arrival and defibrillator suggested cardiac rhythm should be shocked, therefore one shock administered.  Once EMS arrived the pt went into a Vfibb arrest and CPR was in progress by fire department.  She was shocked a 2nd time and then went into PEA arrest, therefore she received 3 rounds of epinephrine with ROSC, estimated total downtime 25 minutes. EMS placed a [place] airway in the field.  In the ER the pt was mechanically intubated.  Lab results revealed Na+ 131, K+ 2.9, lactic acid 10.3, wbc 27.7, and hgb 9.3. Pt admitted by hospitalist team to ICU for further workup and treatment. PCCM consulted for hypothermic protocol and vent management.   PAST MEDICAL HISTORY :  She  has a past medical history of Abnormal mammogram, unspecified (2013); Acute renal failure (ARF) ([place]); Breast screening, unspecified (2013); Broken leg; Cellulitis; Crohn's disease ([place]) (2013); Depression; Diabetes mellitus without complication ([place]); Early menopause; Edema; Encephalopathy; GERD (gastroesophageal reflux disease); Hiatal hernia (2013); Hyperammonemia ([place]); Hyperlipidemia; Hypertension; Hypotension;  Hypotension, chronic; Hypothyroidism; Mass, eye (1990); Obesity, unspecified (2013); Other sign and symptom in breast (2013); Recurrent cellulitis of lower leg; Regional enteritis ([place]); Rib fracture; Scalp laceration; Schizophrenia ([place]); Schizophrenia, undifferentiated ([place]); TBI (traumatic brain injury) ([place]); Thyroid disease; and Vitamin D deficiency.  PAST SURGICAL HISTORY: She  has a past surgical history that includes Tumor excision (Right); Eye surgery (Left, 2013); Colonoscopy with propofol (N/A, 02/16/2016); Colonoscopy with propofol (N/A, 02/22/2016); Cardiac catheterization (N/A, 10/13/2016); Colonoscopy with propofol (N/A, 10/14/2016); and Cardiac catheterization (N/A, 10/18/2016).  Allergies  Allergen Reactions  . Peanut Oil Other (See Comments)    Face turns red  . Risperidone And Related Cough    No current facility-administered medications on file prior to encounter.    Current Outpatient Prescriptions on File Prior to Encounter  Medication Sig  . Amino Acids-Protein Hydrolys (FEEDING SUPPLEMENT, PRO-STAT SUGAR FREE 64,) LIQD Take 30 mLs by mouth 3 (three) times daily with meals.  . calcium carbonate (OSCAL) 1500 (600 Ca) MG TABS tablet Take 1,500 mg by mouth 2 (two) times daily with a meal.  . citalopram (CELEXA) 40 MG tablet Take 40 mg by mouth daily.  . furosemide (LASIX) 40 MG tablet Take 1 tablet (40 mg total) by mouth daily.  Marland Kitchen gemfibrozil (LOPID) 600 MG tablet Take 600 mg by mouth 2 (two) times daily before a meal.  . lactulose (CHRONULAC) 10 GM/15ML solution Take 45 mLs (30 g total) by mouth 2 (two) times daily.  Marland Kitchen levothyroxine (SYNTHROID, LEVOTHROID) 50 MCG tablet Take 1 tablet (50 mcg total) by mouth daily before breakfast.  . LORazepam (ATIVAN) 1 MG tablet Take 1 mg by mouth daily.  . magnesium oxide (MAGNESIUM-OXIDE) 400 (241.3 Mg) MG tablet Take 400 mg by mouth 2 (two)  times daily.  . mesalamine (APRISO) 0.375 g 24 hr capsule Take 1,500 mg by mouth daily.  .  midodrine (PROAMATINE) 10 MG tablet Take 10 mg by mouth 4 (four) times daily.  . Multiple Vitamin (MULTIVITAMIN WITH MINERALS) TABS tablet Take 1 tablet by mouth daily.  Marland Kitchen omega-3 acid ethyl esters (LOVAZA) 1 g capsule Take 1 g by mouth daily.  . pantoprazole (PROTONIX) 40 MG tablet Take 40 mg by mouth daily.  . potassium chloride (K-DUR,KLOR-CON) 10 MEQ tablet Take 20 mEq by mouth 2 (two) times daily.   . vitamin B-12 (CYANOCOBALAMIN) 1000 MCG tablet Take 1,000 mcg by mouth daily.  . diphenhydrAMINE (BENADRYL) 25 MG tablet Take 50 mg by mouth at bedtime as needed.  . feeding supplement, ENSURE ENLIVE, (ENSURE ENLIVE) LIQD Take 237 mLs by mouth 2 (two) times daily between meals. (Patient not taking: Reported on 04/22/2017)    FAMILY HISTORY:  Her indicated that her mother is deceased. She indicated that her father is deceased. She indicated that the status of her paternal aunt is unknown.    SOCIAL HISTORY: She  reports that she has never smoked. She has never used smokeless tobacco. She reports that she does not drink alcohol or use drugs.  REVIEW OF SYSTEMS:   Unable to assess pt intubated   SUBJECTIVE:  Unable to assess pt intubated.   VITAL SIGNS: BP 92/77   Pulse (!) 51   Temp (!) 91.4 F (33 C) (Core (Comment))   Resp (!) 0   Wt 103.6 kg (228 lb 6.4 oz)   SpO2 100%   BMI 38.52 kg/m   HEMODYNAMICS: CVP:  [2 mmHg-12 mmHg] 7 mmHg  VENTILATOR SETTINGS: Vent Mode: PRVC FiO2 (%):  [35 %-50 %] 35 % Set Rate:  [14 bmp-16 bmp] 14 bmp Vt Set:  [500 mL] 500 mL PEEP:  [5 cmH20] 5 cmH20  INTAKE / OUTPUT: I/O last 3 completed shifts: In: 3998.3 [I.V.:3948.3; IV Piggyback:50] Out: 1310 [Urine:1310]  PHYSICAL EXAMINATION: General: acutely ill appearing Caucasian female, mechanically intubated  Neuro: not following commands, PERRL, No Doll's eyes, No corneal reflex (but on versed, fentanyl, and Nimbex gtt's) HEENT: supple, no JVD Cardiovascular: s1s2, rrr, no M/R/G Lungs:  course throughout, symmetrical expansion, non labored Abdomen: +BS x4, soft, obese, non tender, non distended  Musculoskeletal: normal tone, 2+ bilateral generalized edema Skin: chronic vascular skin changes bilateral lower extremities  LABS:  BMET  Recent Labs Lab 04/28/2017 2323 04/25/17 0111 04/25/17 0523  NA 132* 134* 135  K 2.5* 2.8* 3.0*  CL 99* 102 104  CO2 22 24 23   BUN <5* <5* <5*  CREATININE 0.81 0.98 0.98  GLUCOSE 201* 215* 140*    Electrolytes  Recent Labs Lab 04/21/2017 2102 04/03/2017 2323 04/25/17 0111 04/25/17 0523  CALCIUM 6.8* 6.8* 6.7* 6.7*  MG 1.5*  --   --   --     CBC  Recent Labs Lab 04/19/2017 1510 04/02/2017 1717  WBC 17.2* 27.7*  HGB 10.4* 9.3*  HCT 30.7* 27.9*  PLT 442* 356    Coag's  Recent Labs Lab 04/27/2017 1717 04/09/2017 2102 04/25/17 0418  APTT  --  49* 39*  INR 1.38  --   --     Sepsis Markers  Recent Labs Lab 04/11/2017 1717 04/16/2017 2102  LATICACIDVEN 10.3* 5.5*  PROCALCITON  --  0.72    ABG  Recent Labs Lab 04/16/2017 1850  PHART 7.34*  PCO2ART 37  PO2ART 115*    Liver Enzymes  Recent Labs Lab 04/23/2017 1717  AST 204*  ALT 56*  ALKPHOS 179*  BILITOT 0.8  ALBUMIN 1.2*    Cardiac Enzymes  Recent Labs Lab 04/10/2017 2102 04/25/17 0111  TROPONINI 0.09* 0.13*    Glucose  Recent Labs Lab 04/25/17 0222 04/25/17 0324 04/25/17 0425 04/25/17 0528 04/25/17 0626 04/25/17 0717  GLUCAP 220* 200* 173* 154* 133* 129*    Imaging Ct Head Wo Contrast  Result Date: 04/17/2017 CLINICAL DATA:  Unresponsive following CPR. EXAM: CT HEAD WITHOUT CONTRAST TECHNIQUE: Contiguous axial images were obtained from the base of the skull through the vertex without intravenous contrast. COMPARISON:  03/02/2017. FINDINGS: Brain: No evidence of acute infarction, hemorrhage, hydrocephalus, extra-axial collection or mass lesion/mass effect. Vascular: No hyperdense vessel or unexpected calcification. Skull: Bilateral  hyperostosis frontalis.  No acute abnormality. Sinuses/Orbits: Right sphenoid and left maxillary sinus mucosal thickening. Other: Previously noted scattered trichilemmal scalp cysts. IMPRESSION: No acute abnormality. 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 Electronically Signed   By: Claudie Revering M.D.   On: 04/17/2017 18:39   Dg Chest Portable 1 View  Result Date: 04/25/2017 CLINICAL DATA:  Status post CPR with intubation and central line placement. EXAM: PORTABLE CHEST 1 VIEW COMPARISON:  03/03/2017. FINDINGS: Poor inspiration. Normal sized heart. Clear lungs with normal vascularity. Endotracheal tube tip 2.3 cm above the carina. The right jugular catheter tip in the mid upper right atrium. Unremarkable bones. No pneumothorax. IMPRESSION: 1. No acute abnormality. 2. Right jugular catheter tip in the right atrium. This could be retracted 3 cm to place in the inferior aspect of the superior vena cava. Electronically Signed   By: Claudie Revering M.D.   On: 04/02/2017 18:32   Dg Abd Portable 1 View  Result Date: 04/13/2017 CLINICAL DATA:  Nasogastric tube placement. Status post CPR and intubation. EXAM: PORTABLE ABDOMEN - 1 VIEW COMPARISON:  09/27/2016. FINDINGS: Nasogastric tube tip and side hole in the proximal stomach. Normal bowel gas pattern. Old posttraumatic changes involving the left twelfth rib without change. No acute bony abnormality. IMPRESSION: No acute abnormality. Nasogastric tube tip and side hole in the proximal stomach. Electronically Signed   By: Claudie Revering M.D.   On: 04/25/2017 18:33   STUDIES:  CT Head 06/25>>No acute abnormality. 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  CULTURES: Blood 06/25>> Urine 06/25>> Respiratory 06/26>>  ANTIBIOTICS: Vancomycin 06/25 x1 dose Zosyn 06/25 x1 dose  Zosyn 06/26>>  SIGNIFICANT EVENTS: 06/25-Pt admitted to ICU post cardiac arrest mechanically intubated.  06/25- Targeted Temperature Management  (TTM) initiated, Goal Temperature of 33C met @ 2225 06/26- TTM continues, to begin rewarming phase at 2225   LINES/TUBES: Right IJ 06/25>> ETT 06/25>> Right Femoral A-line 06/25>>  ASSESSMENT / PLAN:  This is a 46 y.o. Female who presents with V-fib/PEA cardiac arrest, probably secondary to ischemic cardiomyopathy, now with subsequent Cardiogenic Shock requiring vasopressors and multi-organ failure.  PULMONARY A: Acute cardiorespiratory arrest (Vfib/PEA arrest)  Mechanical Intubation  Hx: Obesity  P:   Full vent support  Hypothermia protocol @33  C  VAP bundle  Repeat ABG and CXR in am   CARDIOVASCULAR A:  Acute cardiorespiratory arrest (Vfib and PEA arrest) likely secondary to ischemic cardiomyopathy Hx: HTN, Chronic hypotension, and Hyperlipidemia P:  Cardiology consulted appreciate input-per cardiology no indication for emergent cardiac catheterization at this time.    Trend troponin's Elevated troponin's likely secondary to Cardiac arrest and CPR Levophed gtt to maintain map greater or equal to 80 mmHg during hypothermia  protocol  Continuous telemetry monitoring  RENAL A:   Hypokalemia  Severe Lactic Acidosis-Improving  Hx: Acute renal failure  P:   Trend CMP Replace electrolytes as indicated Monitor UOP Trend lactic acid  CVP's q4hr NS w/40K @100  ml/hr   GASTROINTESTINAL A:   Elevated liver enzymes-Slightly improving Hx: Regional enteritis, GERD, Chr and Hiatal Hernia  P:   Keep NPO for now  Trend liver enzymes Pepcid for PUD prophylaxis   HEMATOLOGIC A:   Anemia without acute blood loss  P:  Trend CBC Subq heparin for VTE prophylaxis  Transfuse for hgb <7 Trend APTT per hypothermic protocol  Monitor for s/sx of bleeding   INFECTIOUS A:   Leukocytosis  P:   Trend WBC and monitor fever curve Trend lactic acid and PCT Follow cultures Abx as above  ENDOCRINE A:   Diabetes Mellitus Hx: Hypothyroidism  P:   CBG's q1hr during hypothermic  protocol  Insulin gtt Thyroid panel with TSH pending   NEUROLOGIC A:   Hepatic Encephalopathy  Hx: Schizophrenia, Depression, Traumatic Brain Injury, Hyperammonemia, and Encephalopathy  P:   RASS goal: -5 for duration of neuromuscular blockade therapy  Nimbex, Versed, and Fentanyl gtts to maintain RASS goal  Stat ammonia level WUA post hypothermia protocol completion  EEG pending   FAMILY  - Updates: No family at bedside to update 04/25/2017  - Inter-disciplinary family meet or Palliative Care meeting due by: 05/01/2017

## 2017-04-25 NOTE — Clinical Social Work Note (Signed)
Please see most recent psycho-social assessment completed on June 6th. No changes to this psycho-social assessment.  Patient remains a resident at [facility]: [address]  (med tech Hosie Spangle, cell # [phone].) Barbaraann Share she is patient's aunt and lives in [place]. Barbaraann Share reported that she is only patient's HPOA and not her guardian.   Call placed to [facility] at the group home [phone]) regarding any additional information needed and update on patient care. Levada Dy reports patient has been doing well, on the day she was admitted, patient worked with PT and then went to her cancer appointment at 3pm.  When she got home she went to her room and laid down and another resident heard her fall out of the bed. Levada Dy and the owner of the group home came and started CPR as they called the EMS to bring her to the hospital. Levada Dy processed the event and also the emotional tole it has taken on group home and concern for patient.  Patient currently has services with HH: [facility] with PT, OT and nursing.   Patient has an aunt who is also involved and updated by the group home.  Aunt lives out of state.  Patient currently on vent support. Will continue to follow patient acutely and assist with discharge planning once patient medically stable. Plans at this time will be to return to ALF if stable and ALF will need FL2 at discharge/ updated.  Lane Hacker, MSW Clinical Social Work: System Wide Float Coverage for :  [phone]     Clinical Social Work Assessment  Patient Details  Name: RAMESHA POSTER MRN: [id number] Date of Birth: May 04, 1971  Date of referral:  04/05/17               Reason for consult:  Other (Comment Required) (From [facility] )                    Permission sought to share information with:  Facility Art therapist granted to share information::  Yes, Verbal Permission Granted             Name::                   Agency::                 Relationship::                Contact Information:     Housing/Transportation Living arrangements for the past 2 months:  [place] of Information:  Patient, Power of Forensic psychologist, Facility Patient Interpreter Needed:  None Criminal Activity/Legal Involvement Pertinent to Current Situation/Hospitalization:  No - Comment as needed Significant Relationships:  Other Family Members Lives with:  Facility Resident Do you feel safe going back to the place where you live?  Yes Need for family participation in patient care:  Yes (Comment)  Care giving concerns:  Patient is a resident at [facility] group home located at [address] [place], [place] [address], phone [phone] fax [phone].     Social Worker assessment / plan:  Holiday representative (CSW) received verbal consult from MD that patient is from a group home and is stable for D/C back to the group home today. PT is recommending patient return back to group home. CSW contacted [facility] group home and spoke to med tech Hosie Spangle, cell # [phone]. Per Levada Dy patient is a resident at [facility] [address] and  can return to the group home when stable. Per [address] is owned by [employer]. CSW made Levada Dy aware that patient is stable for D/C today and sent D/C orders to [facility]. Per Levada Dy she will send a taxi to pick patient up at [facility]. Taxi driver will call 1A at [phone] when taxi arrives at visitors entrance. CSW contacted patient's HPOA Louise. Per Barbaraann Share she is patient's aunt and lives in [place]. Barbaraann Share reported that she is only patient's HPOA and not her guardian. Barbaraann Share reported that patient was living at [facility] group home and then moved to [address]. Per Barbaraann Share patient was recently at [facility] for short term rehab and was discharged back to [facility] last week. Barbaraann Share is agreeable for patient to return to [facility] today. Patient is aware of above and agreeable of D/C plan.  Please reconsult if future social work needs arise. CSW signing off.   Employment status:  Disabled (Comment on whether or not currently receiving Disability) Insurance information:  Medicare, Medicaid In [place] PT Recommendations:   (PT recommened patient be discharged back to her group home. ) Information / Referral to community resources:  Other (Comment Required) (Patient will return back to her group home. )  Patient/Family's Response to care:  Patient is agreeable to return to group home today.   Patient/Family's Understanding of and Emotional Response to Diagnosis, Current Treatment, and Prognosis:  Patient was very pleasant and thanked CSW for assistance.   Emotional Assessment Appearance:  Appears stated age Attitude/Demeanor/Rapport:    Affect (typically observed):  Accepting, Adaptable, Pleasant Orientation:  Oriented to Self, Oriented to Place, Oriented to  Time, Oriented to Situation Alcohol / Substance use:  Not Applicable Psych involvement (Current and /or in the community):  No (Comment)  Discharge Needs  Concerns to be addressed:  Discharge Planning Concerns Readmission within the last 30 days:  Yes Current discharge risk:  Chronically ill, Cognitively Impaired Barriers to Discharge:  No Barriers Identified   Sample, Veronia Beets, LCSW 04/05/2017, 10:31 AM

## 2017-04-25 NOTE — Progress Notes (Signed)
PHARMACY - PHYSICIAN COMMUNICATION CRITICAL VALUE ALERT - BLOOD CULTURE IDENTIFICATION (BCID)  Results for orders placed or performed during the hospital encounter of 04/11/2017  Blood Culture ID Panel (Reflexed) (Collected: 04/23/2017  5:50 PM)  Result Value Ref Range   Enterococcus species NOT DETECTED NOT DETECTED   Listeria monocytogenes NOT DETECTED NOT DETECTED   Staphylococcus species DETECTED (A) NOT DETECTED   Staphylococcus aureus NOT DETECTED NOT DETECTED   Methicillin resistance DETECTED (A) NOT DETECTED   Streptococcus species NOT DETECTED NOT DETECTED   Streptococcus agalactiae NOT DETECTED NOT DETECTED   Streptococcus pneumoniae NOT DETECTED NOT DETECTED   Streptococcus pyogenes NOT DETECTED NOT DETECTED   Acinetobacter baumannii NOT DETECTED NOT DETECTED   Enterobacteriaceae species NOT DETECTED NOT DETECTED   Enterobacter cloacae complex NOT DETECTED NOT DETECTED   Escherichia coli NOT DETECTED NOT DETECTED   Klebsiella oxytoca NOT DETECTED NOT DETECTED   Klebsiella pneumoniae NOT DETECTED NOT DETECTED   Proteus species NOT DETECTED NOT DETECTED   Serratia marcescens NOT DETECTED NOT DETECTED   Haemophilus influenzae NOT DETECTED NOT DETECTED   Neisseria meningitidis NOT DETECTED NOT DETECTED   Pseudomonas aeruginosa NOT DETECTED NOT DETECTED   Candida albicans NOT DETECTED NOT DETECTED   Candida glabrata NOT DETECTED NOT DETECTED   Candida krusei NOT DETECTED NOT DETECTED   Candida parapsilosis NOT DETECTED NOT DETECTED   Candida tropicalis NOT DETECTED NOT DETECTED    Name of physician (or Provider) Contacted: Dr. Alva Garnet  Changes to prescribed antibiotics required: possible contaminate- dr simonds will take a look and place orders if needed  Ramond Dial 04/25/2017  3:26 PM

## 2017-04-25 NOTE — Progress Notes (Signed)
Initial Nutrition Assessment  DOCUMENTATION CODES:   Not applicable  INTERVENTION:  1. Received verbal consult from MD Kasa to begin trickle feeds Vital High Protein @ 89mL/hr via OGT provides 480 calories, 42 grams protein, 401cc free water  NUTRITION DIAGNOSIS:   Inadequate oral intake related to inability to eat as evidenced by NPO status.  GOAL:   Provide needs based on ASPEN/SCCM guidelines  MONITOR:   Labs, Weight trends, Vent status, I & O's, TF tolerance  REASON FOR ASSESSMENT:   Ventilator    ASSESSMENT:   Patient found unresponsive at the group home. She underwent CPR with shock in the field. She is currently intubated on the ventilator. No family. Unable to provide any history or review of systems since patient is intubated  Patient is currently intubated on ventilator support MV: 5.9 L/min Temp (24hrs), Avg:93.9 F (34.4 C), Min:90.5 F (32.5 C), Max:96.5 F (35.8 C) Propofol: none Discussed in Rounds [place] Protocol No family at bedside, per chart appears she exhibits a 44#/16% severe wt loss over 2 months. Nutrition-Focused physical exam completed. Findings are no fat depletion, no muscle depletion, and no edema.  Access: PIV, CVC, R Femoral Labs and medications reviewed: NS w/ KCL 35mEq @ 145mL/hr Nimbex gtt, Fentanyl gtt, Versed gtt, Levo gtt K 3.0, Na 134, Elevated LFTs  Diet Order:  Diet NPO time specified  Skin:  Wound (see comment) (Abrasion to arm and leg)  Last BM:  04/25/2017  Height:   Ht Readings from Last 1 Encounters:  04/17/17 5' 4.57" (1.64 m)    Weight:   Wt Readings from Last 1 Encounters:  04/06/2017 228 lb 6.4 oz (103.6 kg)    Ideal Body Weight:  55.7 kg  BMI:  Body mass index is 38.52 kg/m.  Estimated Nutritional Needs:   Kcal:  1140-1450 calories  Protein:  >/= 111 grams  Fluid:  Per MD/NP/PA  EDUCATION NEEDS:   Education needs no appropriate at this time  Satira Anis. Tine Mabee, MS, RD LDN Inpatient  Clinical Dietitian Pager [phone]

## 2017-04-25 NOTE — Progress Notes (Signed)
Patient ID: [name], female   DOB: 05-30-1971, 46 y.o.   MRN: [id number]  [facility] PROGRESS NOTE  [name] PJA:[id number] DOB: May 27, 1971 DOA: 04/12/2017 PCP: Remi Haggard, FNP  HPI/Subjective: Patient collapsed. CPR was started. Currently undergoing ice protocol  Objective: Vitals:   04/25/17 1300 04/25/17 1400  BP: 92/73 91/60  Pulse:    Resp: 14 14  Temp: (!) 91.6 F (33.1 C) (!) 91.9 F (33.3 C)    Filed Weights   04/28/2017 1715  Weight: 103.6 kg (228 lb 6.4 oz)    ROS: Review of Systems  Unable to perform ROS: Critical illness   Exam: Physical Exam  HENT:  Nose: No mucosal edema.  Eyes: Conjunctivae and lids are normal.  Pupils pinpoint  Neck: No JVD present. Carotid bruit is not present. No edema present. No thyroid mass and no thyromegaly present.  Cardiovascular: S1 normal and S2 normal.  Exam reveals no gallop.   No murmur heard. Pulses:      Dorsalis pedis pulses are 2+ on the right side, and 2+ on the left side.  Respiratory: No respiratory distress. She has decreased breath sounds in the right lower field and the left lower field. She has no wheezes. She has no rhonchi. She has no rales.  GI: Soft. Bowel sounds are normal. There is no tenderness.  Musculoskeletal:       Right ankle: She exhibits swelling.       Left ankle: She exhibits swelling.  Lymphadenopathy:    She has no cervical adenopathy.  Neurological:  Unresponsive to painful stimuli  Skin: Nails show no clubbing.  Chronic lower extremity discoloration bilaterally  Psychiatric:  Unresponsive to painful stimuli      Data Reviewed: Basic Metabolic Panel:  Recent Labs Lab 04/14/2017 2102 04/19/2017 2323 04/25/17 0111 04/25/17 0523 04/25/17 0713 04/25/17 1126  NA 132* 132* 134* 135 134* 134*  K 2.7* 2.5* 2.8* 3.0* 3.0* 3.8  CL 99* 99* 102 104 103 106  CO2 24 22 24 23 22  21*  GLUCOSE 151* 201* 215* 140* 126* 159*  BUN 5* <5* <5* <5* 5* <5*  CREATININE  1.07* 0.81 0.98 0.98 1.14* 0.87  CALCIUM 6.8* 6.8* 6.7* 6.7* 6.7* 6.8*  MG 1.5*  --   --   --  1.9  --   PHOS  --   --   --   --   --  3.1   Liver Function Tests:  Recent Labs Lab 04/05/2017 1717 04/25/17 0713  AST 204* 178*  ALT 56* 62*  ALKPHOS 179* 211*  BILITOT 0.8 0.9  PROT 3.7* 4.3*  ALBUMIN 1.2* 1.3*    Recent Labs Lab 04/20/2017 2102  AMMONIA 39*   CBC:  Recent Labs Lab 04/10/2017 1510 04/16/2017 1717  WBC 17.2* 27.7*  NEUTROABS 3.5 11.4*  HGB 10.4* 9.3*  HCT 30.7* 27.9*  MCV 89.9 94.0  PLT 442* 356   Cardiac Enzymes:  Recent Labs Lab 04/08/2017 2102 04/25/17 0111 04/25/17 0713 04/25/17 1313  TROPONINI 0.09* 0.13* 0.17* 0.14*    CBG:  Recent Labs Lab 04/25/17 1024 04/25/17 1127 04/25/17 1216 04/25/17 1315 04/25/17 1418  GLUCAP 154* 130* 172* 187* 159*    Recent Results (from the past 240 hour(s))  Culture, blood (Routine x 2)     Status: None (Preliminary result)   Collection Time: 04/11/2017  5:45 PM  Result Value Ref Range Status   Specimen Description BLOOD CENTRAL LINE  Final   Special Requests  Final    BOTTLES DRAWN AEROBIC AND ANAEROBIC Blood Culture adequate volume   Culture NO GROWTH < 24 HOURS  Final   Report Status PENDING  Incomplete  Culture, blood (Routine x 2)     Status: None (Preliminary result)   Collection Time: 03/31/2017  5:50 PM  Result Value Ref Range Status   Specimen Description BLOOD RESISTANT FATTY CASTS  Final   Special Requests   Final    BOTTLES DRAWN AEROBIC AND ANAEROBIC Blood Culture adequate volume   Culture  Setup Time   Final    Organism ID to follow GRAM POSITIVE COCCI IN BOTH AEROBIC AND ANAEROBIC BOTTLES CRITICAL RESULT CALLED TO, READ BACK BY AND VERIFIED WITH: MELISSA Clatonia 04/25/17 @ 74  [place]    Culture GRAM POSITIVE COCCI  Final   Report Status PENDING  Incomplete  Blood Culture ID Panel (Reflexed)     Status: Abnormal   Collection Time: 04/21/2017  5:50 PM  Result Value Ref Range Status    Enterococcus species NOT DETECTED NOT DETECTED Final   Listeria monocytogenes NOT DETECTED NOT DETECTED Final   Staphylococcus species DETECTED (A) NOT DETECTED Final    Comment: Methicillin (oxacillin) resistant coagulase negative staphylococcus. Possible blood culture contaminant (unless isolated from more than one blood culture draw or clinical case suggests pathogenicity). No antibiotic treatment is indicated for blood  culture contaminants. CRITICAL RESULT CALLED TO, READ BACK BY AND VERIFIED WITH: MELISSA Hedley 04/25/17 @ 8676  [place]    Staphylococcus aureus NOT DETECTED NOT DETECTED Final   Methicillin resistance DETECTED (A) NOT DETECTED Final    Comment: CRITICAL RESULT CALLED TO, READ BACK BY AND VERIFIED WITH: MELISSA [place] 04/25/17 @ 1950  [place]    Streptococcus species NOT DETECTED NOT DETECTED Final   Streptococcus agalactiae NOT DETECTED NOT DETECTED Final   Streptococcus pneumoniae NOT DETECTED NOT DETECTED Final   Streptococcus pyogenes NOT DETECTED NOT DETECTED Final   Acinetobacter baumannii NOT DETECTED NOT DETECTED Final   Enterobacteriaceae species NOT DETECTED NOT DETECTED Final   Enterobacter cloacae complex NOT DETECTED NOT DETECTED Final   Escherichia coli NOT DETECTED NOT DETECTED Final   Klebsiella oxytoca NOT DETECTED NOT DETECTED Final   Klebsiella pneumoniae NOT DETECTED NOT DETECTED Final   Proteus species NOT DETECTED NOT DETECTED Final   Serratia marcescens NOT DETECTED NOT DETECTED Final   Haemophilus influenzae NOT DETECTED NOT DETECTED Final   Neisseria meningitidis NOT DETECTED NOT DETECTED Final   Pseudomonas aeruginosa NOT DETECTED NOT DETECTED Final   Candida albicans NOT DETECTED NOT DETECTED Final   Candida glabrata NOT DETECTED NOT DETECTED Final   Candida krusei NOT DETECTED NOT DETECTED Final   Candida parapsilosis NOT DETECTED NOT DETECTED Final   Candida tropicalis NOT DETECTED NOT DETECTED Final  MRSA PCR Screening     Status: None    Collection Time: 04/18/2017  7:50 PM  Result Value Ref Range Status   MRSA by PCR NEGATIVE NEGATIVE Final    Comment:        The GeneXpert MRSA Assay (FDA approved for NASAL specimens only), is one component of a comprehensive MRSA colonization surveillance program. It is not intended to diagnose MRSA infection nor to guide or monitor treatment for MRSA infections.   Culture, respiratory (NON-Expectorated)     Status: None (Preliminary result)   Collection Time: 04/25/17  6:09 AM  Result Value Ref Range Status   Specimen Description TRACHEAL ASPIRATE  Final   Special Requests NONE  Final  Gram Stain   Final    RARE WBC PRESENT, PREDOMINANTLY MONONUCLEAR NO ORGANISMS SEEN Performed at [facility] [address], [place], [place] [address]    Culture PENDING  Incomplete   Report Status PENDING  Incomplete     Studies: Ct Head Wo Contrast  Result Date: 04/17/2017 CLINICAL DATA:  Unresponsive following CPR. EXAM: CT HEAD WITHOUT CONTRAST TECHNIQUE: Contiguous axial images were obtained from the base of the skull through the vertex without intravenous contrast. COMPARISON:  03/02/2017. FINDINGS: Brain: No evidence of acute infarction, hemorrhage, hydrocephalus, extra-axial collection or mass lesion/mass effect. Vascular: No hyperdense vessel or unexpected calcification. Skull: Bilateral hyperostosis frontalis.  No acute abnormality. Sinuses/Orbits: Right sphenoid and left maxillary sinus mucosal thickening. Other: Previously noted scattered trichilemmal scalp cysts. IMPRESSION: No acute abnormality. 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 Electronically Signed   By: Claudie Revering M.D.   On: 04/08/2017 18:39   Dg Chest Portable 1 View  Result Date: 04/29/2017 CLINICAL DATA:  Status post CPR with intubation and central line placement. EXAM: PORTABLE CHEST 1 VIEW COMPARISON:  03/03/2017. FINDINGS: Poor inspiration. Normal sized heart. Clear lungs with normal  vascularity. Endotracheal tube tip 2.3 cm above the carina. The right jugular catheter tip in the mid upper right atrium. Unremarkable bones. No pneumothorax. IMPRESSION: 1. No acute abnormality. 2. Right jugular catheter tip in the right atrium. This could be retracted 3 cm to place in the inferior aspect of the superior vena cava. Electronically Signed   By: Claudie Revering M.D.   On: 04/17/2017 18:32   Dg Abd Portable 1 View  Result Date: 04/28/2017 CLINICAL DATA:  Nasogastric tube placement. Status post CPR and intubation. EXAM: PORTABLE ABDOMEN - 1 VIEW COMPARISON:  09/27/2016. FINDINGS: Nasogastric tube tip and side hole in the proximal stomach. Normal bowel gas pattern. Old posttraumatic changes involving the left twelfth rib without change. No acute bony abnormality. IMPRESSION: No acute abnormality. Nasogastric tube tip and side hole in the proximal stomach. Electronically Signed   By: Claudie Revering M.D.   On: 04/09/2017 18:33    Scheduled Meds: . artificial tears  1 application Both Eyes J9E  . chlorhexidine gluconate (MEDLINE KIT)  15 mL Mouth Rinse BID  . feeding supplement (VITAL HIGH PROTEIN)  1,000 mL Per Tube Q24H  . fentaNYL (SUBLIMAZE) injection  100 mcg Intravenous Once  . heparin  5,000 Units Subcutaneous Q8H  . mouth rinse  15 mL Mouth Rinse 10 times per day  . midazolam  2 mg Intravenous Once  . polyvinyl alcohol  1 drop Both Eyes Q3H  . sodium chloride flush  10-40 mL Intracatheter Q12H   Continuous Infusions: . sodium chloride 10 mL/hr at 04/25/17 1400  . 0.9 % NaCl with KCl 40 mEq / L 100 mL/hr at 04/25/17 1400  . cisatracurium (NIMBEX) infusion 1 mcg/kg/min (04/25/17 1400)  . famotidine (PEPCID) IV Stopped (04/25/17 1110)  . fentaNYL 200 mcg/hr (04/25/17 1400)  . insulin (NOVOLIN-R) infusion 1 Units/hr (04/25/17 1419)  . midazolam (VERSED) infusion 4 mg/hr (04/25/17 1400)  . norepinephrine (LEVOPHED) Adult infusion 50 mcg/min (04/25/17 1435)  . piperacillin-tazobactam  (ZOSYN)  IV 3.375 g (04/25/17 1124)  . vasopressin (PITRESSIN) infusion - *FOR SHOCK* 0.03 Units/min (04/25/17 1400)    Assessment/Plan:  1. Cardiopulmonary arrest. With hypokalemia could be arrhythmia related. Currently undergoing ice protocol. May undergo warming this evening. 2. Acute hypoxic respiratory failure. Continue ventilatory support. FiO2 30%. Sedation as per critical care specialist 3.  Severe lactic acidosis. Continue ventilatory support 4. Shock on levophed and vasopressin 5. Type 2 diabetes mellitus on insulin drip 6. Acute encephalopathy. EEG ordered.  7. Just got called with positive blood culture which could be contamination. The patient is on Zosyn and did receive 1 dose of vancomycin already. Follow-up final results of blood cultures. Pharmacist will call critical care specialist for further orders. 8. Demand ischemia with elevated troponin secondary to cardiac arrest 9.  morbid obesity 10. Hypokalemia. On electrolyte protocol 11. Elevated liver enzymes secondary to cardiopulmonary arrest 12. Anemia continue to watch blood counts closely 13. Smudge cells on CBC could represent CLL patient with leukocytosis also  Code Status:     Code Status Orders        Start     Ordered   04/18/2017 1945  Full code  Continuous     04/12/2017 1944    Code Status History    Date Active Date Inactive Code Status Order ID Comments User Context   04/01/2017  7:22 PM 04/23/2017  7:44 PM Full Code [id number]  Flora Lipps, MD ED   04/03/2017  5:23 PM 04/05/2017  2:40 PM Full Code [id number]  Gladstone Lighter, MD Inpatient   03/02/2017  7:14 PM 03/07/2017  3:32 PM Full Code [id number]  Loletha Grayer, MD ED   02/14/2017  1:22 AM 02/15/2017  5:07 PM Full Code [id number]  Harvie Bridge, DO Inpatient   01/23/2017  5:26 PM 01/28/2017  5:54 PM Full Code [id number]  Demetrios Loll, MD Inpatient   09/27/2016  8:45 AM 10/18/2016  8:43 PM Full Code [id number]  Flora Lipps, MD ED     Family Communication:  As per critical care specialist  Disposition Plan: to be determined based on clinical course  Consultants:  Cardiology  Critical care specialist  Antibiotics:  Zosyn   Time spent: 25 minutes  [place], [facility]

## 2017-04-25 NOTE — Progress Notes (Signed)
Patient has remained stable throughout shift. UOP began to decrease, Dr. Alva Garnet notified and received order to give patient IVF bolus, bolus given. Vasopressin added for BP management, MAP >80 for shift. Wilnette Kales

## 2017-04-25 NOTE — Consult Note (Signed)
Cardiology Consultation Note  Patient ID: [name], MRN: [id number], DOB/AGE: August 21, 1971 46 y.o. Admit date: 04/05/2017   Date of Consult: 04/25/2017 Primary Physician: Remi Haggard, FNP Primary Cardiologist: New to [facility] - consult by End Requesting Physician: Dr. Mortimer Fries, MD  Chief Complaint: Cardiopulmonary arrest Reason for Consult: Same  HPI: [name] is a 46 y.o. female who is being seen today for the evaluation of cardiopulmonary arrest at the request of Dr. Mortimer Fries, MD. Patient has a h/o schizophrenia not on any home antipsychotics, TBI, hepatic encephalopathy on lactulose, hypotension on midodrine, obesity, cellulitis, HTN, HLD, and GERD who was found unresponsive at her group home for an uncertain duration on 4/88 and found to have VF arrest upon EMS arrival.   No previously known cardiac history. Note taken from prior documentation given patient is intubated and no family is present. She presented to [facility] ER 06/25 post cardiac arrest from a group home.  Per ER notes the pts roommate found the pt unresponsive for an unknown duration of time, therefore EMS notified.  Per EMS staff fire department arrived prior to their arrival and defibrillator suggested cardiac rhythm should be shocked, therefore one shock administered.  Once EMS arrived the pt went into a Vfibb arrest and CPR was in progress by fire department.  She was shocked a 2nd time and then went into PEA arrest, therefore she received 3 rounds of epinephrine with ROSC, estimated total downtime 25 minutes. EMS placed a King airway in the field.  In the ER the pt was mechanically intubated. Lab results revealed Na+ 131-->134, K+ 2.7-->3.0-->3.0, lactic acid 10.3, wbc 27.7, hgb 9.3, SCr1.14, AST 178, ALT 62, alk phos 211, albumin 1.3, Ca 6.7, magnesium 1.5-->1.9, ammonia 39, troponin peak of 0.17 to date, PCT 1.50. Echo is pending. She remains intubated and sedated. She is currently on Levophed with the possible ordering of  vasopressin as well.     Past Medical History:  Diagnosis Date  . Abnormal mammogram, unspecified 2013   Prev. cytology,hypercellular smears without evidence of malignant cells. The cytopathologist questioned if samples truly representative. Care taken during sampling and is felt to be representative.  . Acute renal failure (ARF) ([place])    per fl2  . Breast screening, unspecified 2013  . Broken leg    age 46  . Cellulitis   . Crohn's disease ([place]) 2013  . Depression   . Diabetes mellitus without complication (HCC)    non insulin dependent  . Early menopause   . Edema   . Encephalopathy    hepatic encephalopathy per fl2   . GERD (gastroesophageal reflux disease)   . Hiatal hernia 2013  . Hyperammonemia ([place])    per fl2  . Hyperlipidemia   . Hypertension   . Hypotension    per fl2  . Hypotension, chronic   . Hypothyroidism   . 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 Obesity, unspecified 2013  . Other sign and symptom in breast 2013   Right bst US,lower outer quadrant,A single 0.3x0.4x0.6cm hypoechoic mass with slightly lobulated borders with adjacent 0.3x0.4x0.5cm mass was noted. The 1st was 5cm from nipple, 2nd at 8 cm from the nipple. The previous lesion aspirate was at 3 0'clock position. These lesions are thought to account for the mammographic abnormality. Minimal interval change on [place].   Marland Kitchen Recurrent cellulitis of lower leg   . Regional enteritis [facility])   . Rib fracture  age 13  . Scalp laceration    per fl2  . Schizophrenia ([place])   . Schizophrenia, undifferentiated ([place])    per fl2  . TBI (traumatic brain injury) ([place])   . Thyroid disease    hypothyroid  . Vitamin D deficiency       Most Recent Cardiac Studies: TTE 12/2016: Study Conclusions  - Left ventricle: The cavity size was normal. Wall thickness was   normal. Systolic function was normal. The estimated ejection   fraction was in the range of 60% to 65%. Wall motion was  normal;   there were no regional wall motion abnormalities.  Impressions:  - Normal overall left ventricular function   normal wall motion   ejection fraction greater than 55%   normal right-sided. Normal study. No cardiac source of emboli was   indentified.   Surgical History:  Past Surgical History:  Procedure Laterality Date  . COLONOSCOPY WITH PROPOFOL N/A 02/16/2016   Procedure: COLONOSCOPY WITH PROPOFOL;  Surgeon: Lollie Sails, MD;  Location: [facility] ENDOSCOPY;  Service: Endoscopy;  Laterality: N/A;  . COLONOSCOPY WITH PROPOFOL N/A 02/22/2016   Procedure: COLONOSCOPY WITH PROPOFOL;  Surgeon: Lollie Sails, MD;  Location: [facility] ENDOSCOPY;  Service: Endoscopy;  Laterality: N/A;  . COLONOSCOPY WITH PROPOFOL N/A 10/14/2016   Procedure: COLONOSCOPY WITH PROPOFOL;  Surgeon: Jonathon Bellows, MD;  Location: [facility] ENDOSCOPY;  Service: Endoscopy;  Laterality: N/A;  . EYE SURGERY Left 2013   cataract surgery  . PERIPHERAL VASCULAR CATHETERIZATION N/A 10/13/2016   Procedure: Dialysis/Perma Catheter Insertion;  Surgeon: Algernon Huxley, MD;  Location: [place] CV LAB;  Service: Cardiovascular;  Laterality: N/A;  . PERIPHERAL VASCULAR CATHETERIZATION N/A 10/18/2016   Procedure: Dialysis/Perma Catheter Removal;  Surgeon: Katha Cabal, MD;  Location: [place] CV LAB;  Service: Cardiovascular;  Laterality: N/A;  . TUMOR EXCISION Right    age of 56, tumor removed from [place]: Prior to Admission medications   Medication Sig Start Date End Date Taking? Authorizing Provider  Amino Acids-Protein Hydrolys (FEEDING SUPPLEMENT, PRO-STAT SUGAR FREE 64,) LIQD Take 30 mLs by mouth 3 (three) times daily with meals.   Yes [provider]  calcium carbonate (OSCAL) 1500 (600 Ca) MG TABS tablet Take 1,500 mg by mouth 2 (two) times daily with a meal.   Yes [provider]  citalopram (CELEXA) 40 MG tablet Take 40 mg by mouth daily.   Yes [provider]   furosemide (LASIX) 40 MG tablet Take 1 tablet (40 mg total) by mouth daily. 03/07/17  Yes Mody, Ulice Bold, MD  gemfibrozil (LOPID) 600 MG tablet Take 600 mg by mouth 2 (two) times daily before a meal.   Yes [provider]  lactulose (CHRONULAC) 10 GM/15ML solution Take 45 mLs (30 g total) by mouth 2 (two) times daily. 03/07/17  Yes Bettey Costa, MD  levothyroxine (SYNTHROID, LEVOTHROID) 50 MCG tablet Take 1 tablet (50 mcg total) by mouth daily before breakfast. 03/08/17  Yes Mody, Sital, MD  LORazepam (ATIVAN) 1 MG tablet Take 1 mg by mouth daily.   Yes [provider]  magnesium oxide (MAGNESIUM-OXIDE) 400 (241.3 Mg) MG tablet Take 400 mg by mouth 2 (two) times daily.   Yes [provider]  mesalamine (APRISO) 0.375 g 24 hr capsule Take 1,500 mg by mouth daily.   Yes [provider]  midodrine (PROAMATINE) 10 MG tablet Take 10 mg by mouth 4 (four) times daily.   Yes [provider]  Multiple Vitamin (MULTIVITAMIN WITH MINERALS) TABS tablet Take 1 tablet by mouth daily.   Yes [provider]  omega-3 acid ethyl esters (LOVAZA) 1 g capsule Take 1 g by mouth daily.   Yes [provider]  pantoprazole (PROTONIX) 40 MG tablet Take 40 mg by mouth daily.   Yes [provider]  potassium chloride (K-DUR,KLOR-CON) 10 MEQ tablet Take 20 mEq by mouth 2 (two) times daily.    Yes [provider]  vitamin B-12 (CYANOCOBALAMIN) 1000 MCG tablet Take 1,000 mcg by mouth daily.   Yes [provider]  diphenhydrAMINE (BENADRYL) 25 MG tablet Take 50 mg by mouth at bedtime as needed.    [provider]  feeding supplement, ENSURE ENLIVE, (ENSURE ENLIVE) LIQD Take 237 mLs by mouth 2 (two) times daily between meals. Patient not taking: Reported on 04/10/2017 03/07/17   Bettey Costa, MD    Inpatient Medications:  . artificial tears  1 application Both Eyes W5I  . chlorhexidine gluconate (MEDLINE KIT)  15 mL Mouth Rinse BID  .  fentaNYL (SUBLIMAZE) injection  100 mcg Intravenous Once  . heparin  5,000 Units Subcutaneous Q8H  . mouth rinse  15 mL Mouth Rinse 10 times per day  . midazolam  2 mg Intravenous Once  . polyvinyl alcohol  1 drop Both Eyes Q3H   . sodium chloride 10 mL/hr at 04/25/17 1000  . 0.9 % NaCl with KCl 40 mEq / L 100 mL/hr at 04/25/17 1000  . cisatracurium (NIMBEX) infusion 1 mcg/kg/min (04/25/17 1000)  . famotidine (PEPCID) IV 20 mg (04/25/17 1040)  . fentaNYL 200 mcg/hr (04/25/17 1000)  . insulin (NOVOLIN-R) infusion Stopped (04/25/17 6270)  . midazolam (VERSED) infusion 4 mg/hr (04/25/17 1000)  . norepinephrine (LEVOPHED) Adult infusion 40 mcg/min (04/25/17 1053)  . piperacillin-tazobactam (ZOSYN)  IV Stopped (04/25/17 0617)  . potassium chloride 10 mEq (04/25/17 1025)    Allergies:  Allergies  Allergen Reactions  . Peanut Oil Other (See Comments)    Face turns red  . Risperidone And Related Cough    Social History   Social History  . Marital status: Single    Spouse name: N/A  . Number of children: N/A  . Years of education: N/A   Occupational History  . Not on file.   Social History Main Topics  . Smoking status: Never Smoker  . Smokeless tobacco: Never Used  . Alcohol use No  . Drug use: No  . Sexual activity: No   Other Topics Concern  . Not on file   Social History Narrative  . No narrative on file     Family History  Problem Relation Age of Onset  . Cancer Mother 2       colon  . Cancer Paternal Aunt      Review of Systems: Review of Systems  Unable to perform ROS: Intubated    Labs:  Recent Labs  03/31/2017 2102 04/25/17 0111 04/25/17 0713  TROPONINI 0.09* 0.13* 0.17*   Lab Results  Component Value Date   WBC 27.7 (H) 04/10/2017   HGB 9.3 (L) 04/11/2017   HCT 27.9 (L) 04/11/2017   MCV 94.0 04/19/2017   PLT 356 04/14/2017     Recent Labs Lab 04/25/17 0713  NA 134*  K 3.0*  CL 103  CO2 22  BUN 5*  CREATININE 1.14*  CALCIUM 6.7*   PROT 4.3*  BILITOT 0.9  ALKPHOS 211*  ALT 62*  AST 178*  GLUCOSE 126*  No results found for: CHOL, HDL, LDLCALC, TRIG No results found for: DDIMER  Radiology/Studies:  Ct Head Wo Contrast  Result Date: 04/21/2017 CLINICAL DATA:  Unresponsive following CPR. EXAM: CT HEAD WITHOUT CONTRAST TECHNIQUE: Contiguous axial images were obtained from the base of the skull through the vertex without intravenous contrast. COMPARISON:  03/02/2017. FINDINGS: Brain: No evidence of acute infarction, hemorrhage, hydrocephalus, extra-axial collection or mass lesion/mass effect. Vascular: No hyperdense vessel or unexpected calcification. Skull: Bilateral hyperostosis frontalis.  No acute abnormality. Sinuses/Orbits: Right sphenoid and left maxillary sinus mucosal thickening. Other: Previously noted scattered trichilemmal scalp cysts. IMPRESSION: No acute abnormality. 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 Electronically Signed   By: Claudie Revering M.D.   On: 04/26/2017 18:39   Dg Chest Portable 1 View  Result Date: 04/26/2017 CLINICAL DATA:  Status post CPR with intubation and central line placement. EXAM: PORTABLE CHEST 1 VIEW COMPARISON:  03/03/2017. FINDINGS: Poor inspiration. Normal sized heart. Clear lungs with normal vascularity. Endotracheal tube tip 2.3 cm above the carina. The right jugular catheter tip in the mid upper right atrium. Unremarkable bones. No pneumothorax. IMPRESSION: 1. No acute abnormality. 2. Right jugular catheter tip in the right atrium. This could be retracted 3 cm to place in the inferior aspect of the superior vena cava. Electronically Signed   By: Claudie Revering M.D.   On: 04/16/2017 18:32   Dg Abd Portable 1 View  Result Date: 04/04/2017 CLINICAL DATA:  Nasogastric tube placement. Status post CPR and intubation. EXAM: PORTABLE ABDOMEN - 1 VIEW COMPARISON:  09/27/2016. FINDINGS: Nasogastric tube tip and side hole in the proximal stomach. Normal bowel gas  pattern. Old posttraumatic changes involving the left twelfth rib without change. No acute bony abnormality. IMPRESSION: No acute abnormality. Nasogastric tube tip and side hole in the proximal stomach. Electronically Signed   By: Claudie Revering M.D.   On: 04/23/2017 18:33    EKG: Interpreted by me showed: sinus bradycardia, 54, with sinus arrhythmia, prolonged QTc of 654 msec this morning Telemetry: Interpreted by me showed: NSR, 70s bpm, prolonged QT, PVCs on top of prolonged QT  Weights: Filed Weights   04/27/2017 1715  Weight: 228 lb 6.4 oz (103.6 kg)     Physical Exam: Blood pressure 90/73, pulse (!) 51, temperature (!) 91 F (32.8 C), temperature source Core (Comment), resp. rate 14, weight 228 lb 6.4 oz (103.6 kg), SpO2 100 %. Body mass index is 38.52 kg/m. General: Well developed, well nourished, in no acute distress. Ill appearing.  Head: Normocephalic, atraumatic, sclera non-icteric, no xanthomas, nares are without discharge.  Neck: Negative for carotid bruits. JVD not elevated. Lungs: Diminished breath sounds bilaterally. Intubated.  Heart: RRR with S1 S2. No murmurs, rubs, or gallops appreciated. Abdomen: Soft, non-tender, non-distended with normoactive bowel sounds. No hepatomegaly. No rebound/guarding. No obvious abdominal masses. Msk:  Strength and tone appear normal for age. Extremities: No clubbing or cyanosis. Trace bipedal edema. Cool to the touch [employer] protocol noted). Neuro: Intubated and sedated. Psych:  Intubated and sedated.    Assessment and Plan:  Principal Problem:   Cardiopulmonary arrest ([place]) Active Problems:   Undifferentiated schizophrenia ([place])   Hypokalemia   Hypomagnesemia   Prolonged Q-T interval on ECG   Elevated troponin   Multi-organ failure with liver failure ([place])   AKI (acute kidney injury) ([place])   TBI (traumatic brain injury) ([place])    1. Cardiopulmonary arrest: -Patient was found down at her living facility for an unknown amount  of time -VF arrest in the field with defibrillation x 2 in the field along with CPR for at least a total down time of 25 minutes coupled with 3 rounds of epi -Possibly in the setting of her electrolyte abnormalities and prolonged QTc -Intubation per PCCM -At time of admission there was no indication for urgent LHC -EEG with possible neuro consult once [employer] protocol is completed  2. Prolonged QTc: -VF arrest as above -Cannot rule out possibility of torsades given her prolonged QTc and PVCs noted -Recommend holding QT prolonging medications if able, will defer to PCCM/primary service for this -Replete magnesium to goal of at least 2.0 -No antipsychotics as below -No known ABX or AAT or nausea/vomiting medications  3. Elevated troponin: -Check echo today to evaluate EF and for WMA -If echo is abnormal, would need to further assess for ischemia if she is found to have a recoverable outcome -If outcome is found to be poor prior to possible ischemia workup, would likely not pursue invasive testing  4. Hypokalemia: -Recommend repletion to a goal of at least 4.0   5. Multiorgan failure: -As above  6. Schizophrenia: -Not on any antipsychotics as an outpatient  7. TBI: -Per PCCM  8. Malnutrition: -May need IV albumin   Signed, Christell Faith, PA-C [place] Pager: [phone] 04/25/2017, 11:15 AM

## 2017-04-25 NOTE — Progress Notes (Signed)
Pharmacy Antibiotic Note  [name] is a 46 y.o. female admitted on 04/27/2017 with sepsis.  Pharmacy has been consulted for Zosyn dosing.  Plan: Zosyn 3.375g IV q8h (4 hour infusion).  Weight: 228 lb 6.4 oz (103.6 kg)  Temp (24hrs), Avg:95.1 F (35.1 C), Min:90.5 F (32.5 C), Max:96.5 F (35.8 C)   Recent Labs Lab 04/20/2017 1510 04/08/2017 1717 04/03/2017 2102 04/23/2017 2323  WBC 17.2* 27.7*  --   --   CREATININE  --  0.98 1.07* 0.81  LATICACIDVEN  --  10.3* 5.5*  --     Estimated Creatinine Clearance: 103.8 mL/min (by C-G formula based on SCr of 0.81 mg/dL).    Allergies  Allergen Reactions  . Peanut Oil Other (See Comments)    Face turns red  . Risperidone And Related Cough    Antimicrobials this admission: Vancomycin x1  Zosyn 6/25 >>    >>   Dose adjustments this admission:   Microbiology results: 6/25 BCx: pending 6/25 UCx: pending  6/25 MRSA PCR: (-)      6/25 UA: (-) 6/25 CXR: no acute abnormality Thank you for allowing pharmacy to be a part of this patient's care.  Deazia Lampi S 04/25/2017 1:25 AM

## 2017-04-26 ENCOUNTER — Inpatient Hospital Stay: Payer: Medicare Other

## 2017-04-26 ENCOUNTER — Encounter: Payer: Self-pay | Admitting: Family Medicine

## 2017-04-26 DIAGNOSIS — K7290 Hepatic failure, unspecified without coma: Secondary | ICD-10-CM

## 2017-04-26 DIAGNOSIS — G931 Anoxic brain damage, not elsewhere classified: Secondary | ICD-10-CM

## 2017-04-26 LAB — BASIC METABOLIC PANEL
Anion gap: 3 — ABNORMAL LOW (ref 5–15)
Anion gap: 4 — ABNORMAL LOW (ref 5–15)
Anion gap: 5 (ref 5–15)
Anion gap: 5 (ref 5–15)
Anion gap: 6 (ref 5–15)
BUN: 5 mg/dL — AB (ref 6–20)
BUN: 5 mg/dL — AB (ref 6–20)
BUN: 5 mg/dL — AB (ref 6–20)
BUN: 6 mg/dL (ref 6–20)
CALCIUM: 6.5 mg/dL — AB (ref 8.9–10.3)
CALCIUM: 6.5 mg/dL — AB (ref 8.9–10.3)
CALCIUM: 6.3 mg/dL — AB (ref 8.9–10.3)
CO2: 17 mmol/L — AB (ref 22–32)
CO2: 17 mmol/L — ABNORMAL LOW (ref 22–32)
CO2: 17 mmol/L — ABNORMAL LOW (ref 22–32)
CO2: 20 mmol/L — ABNORMAL LOW (ref 22–32)
CO2: 20 mmol/L — ABNORMAL LOW (ref 22–32)
CREATININE: 0.79 mg/dL (ref 0.44–1.00)
CREATININE: 0.99 mg/dL (ref 0.44–1.00)
CREATININE: 0.94 mg/dL (ref 0.44–1.00)
CREATININE: 1.03 mg/dL — AB (ref 0.44–1.00)
Calcium: 6.2 mg/dL — CL (ref 8.9–10.3)
Calcium: 6 mg/dL — CL (ref 8.9–10.3)
Chloride: 109 mmol/L (ref 101–111)
Chloride: 109 mmol/L (ref 101–111)
Chloride: 109 mmol/L (ref 101–111)
Chloride: 113 mmol/L — ABNORMAL HIGH (ref 101–111)
Chloride: 113 mmol/L — ABNORMAL HIGH (ref 101–111)
Creatinine, Ser: 0.93 mg/dL (ref 0.44–1.00)
GFR calc Af Amer: >60 mL/min (ref >60)
GFR calc Af Amer: >60 mL/min (ref >60)
GFR calc Af Amer: >60 mL/min (ref >60)
GFR calc Af Amer: >60 mL/min (ref >60)
GFR calc non Af Amer: >60 mL/min (ref >60)
GLUCOSE: 110 mg/dL — AB (ref 65–99)
GLUCOSE: 112 mg/dL — AB (ref 65–99)
GLUCOSE: 132 mg/dL — AB (ref 65–99)
Glucose, Bld: 127 mg/dL — ABNORMAL HIGH (ref 65–99)
Glucose, Bld: 118 mg/dL — ABNORMAL HIGH (ref 65–99)
Potassium: 3.7 mmol/L (ref 3.5–5.1)
Potassium: 4.2 mmol/L (ref 3.5–5.1)
Potassium: 4.2 mmol/L (ref 3.5–5.1)
Potassium: 4.4 mmol/L (ref 3.5–5.1)
Potassium: 4.5 mmol/L (ref 3.5–5.1)
SODIUM: 134 mmol/L — AB (ref 135–145)
SODIUM: 134 mmol/L — AB (ref 135–145)
SODIUM: 129 mmol/L — AB (ref 135–145)
SODIUM: 135 mmol/L (ref 135–145)
Sodium: 135 mmol/L (ref 135–145)

## 2017-04-26 LAB — GLUCOSE, CAPILLARY
GLUCOSE-CAPILLARY: 100 mg/dL — AB (ref 65–99)
GLUCOSE-CAPILLARY: 109 mg/dL — AB (ref 65–99)
GLUCOSE-CAPILLARY: 99 mg/dL (ref 65–99)
Glucose-Capillary: 101 mg/dL — ABNORMAL HIGH (ref 65–99)
Glucose-Capillary: 126 mg/dL — ABNORMAL HIGH (ref 65–99)
Glucose-Capillary: 111 mg/dL — ABNORMAL HIGH (ref 65–99)

## 2017-04-26 LAB — ALBUMIN: Albumin: 1 g/dL — ABNORMAL LOW (ref 3.5–5.0)

## 2017-04-26 LAB — URINE CULTURE: Culture: NO GROWTH

## 2017-04-26 LAB — THYROID PANEL WITH TSH
FREE THYROXINE INDEX: 1 — AB (ref 1.2–4.9)
T3 Uptake Ratio: 34 % (ref 24–39)
T4 TOTAL: 3 ug/dL — AB (ref 4.5–12.0)
TSH: 10.09 u[IU]/mL — AB (ref 0.450–4.500)

## 2017-04-26 LAB — PROCALCITONIN: Procalcitonin: 2.42 ng/mL

## 2017-04-26 LAB — MAGNESIUM
MAGNESIUM: 1.6 mg/dL — AB (ref 1.7–2.4)
Magnesium: 1.7 mg/dL (ref 1.7–2.4)

## 2017-04-26 MED ORDER — MAGNESIUM SULFATE IN D5W 1-5 GM/100ML-% IV SOLN
1.0000 g | Freq: Once | INTRAVENOUS | Status: AC
  Administered 2017-04-26: 1 g via INTRAVENOUS
  Filled 2017-04-26: qty 100

## 2017-04-26 MED ORDER — LEVETIRACETAM 500 MG/5ML IV SOLN
2000.0000 mg | Freq: Once | INTRAVENOUS | Status: AC
  Administered 2017-04-26: 2000 mg via INTRAVENOUS
  Filled 2017-04-26: qty 20

## 2017-04-26 MED ORDER — SODIUM CHLORIDE 0.9 % IV BOLUS (SEPSIS)
1000.0000 mL | Freq: Once | INTRAVENOUS | Status: AC
  Administered 2017-04-26: 1000 mL via INTRAVENOUS

## 2017-04-26 MED ORDER — SODIUM CHLORIDE 0.9 % IV SOLN
1000.0000 mg | Freq: Two times a day (BID) | INTRAVENOUS | Status: DC
  Administered 2017-04-27: 1000 mg via INTRAVENOUS
  Filled 2017-04-26 (×2): qty 10

## 2017-04-26 MED ORDER — FAMOTIDINE 20 MG PO TABS
20.0000 mg | ORAL_TABLET | Freq: Two times a day (BID) | ORAL | Status: DC
Start: 2017-04-26 — End: 2017-04-28
  Administered 2017-04-26 – 2017-04-28 (×5): 20 mg
  Filled 2017-04-26 (×5): qty 1

## 2017-04-26 MED ORDER — SODIUM CHLORIDE 0.9 % IV SOLN
INTRAVENOUS | Status: DC
  Administered 2017-04-26 – 2017-04-28 (×5): via INTRAVENOUS

## 2017-04-26 MED ORDER — MAGNESIUM SULFATE 2 GM/50ML IV SOLN
2.0000 g | Freq: Once | INTRAVENOUS | Status: AC
  Administered 2017-04-26: 2 g via INTRAVENOUS
  Filled 2017-04-26: qty 50

## 2017-04-26 NOTE — Consult Note (Signed)
Reason for Consult:Abnormal eye movements Referring Physician: [place]  CC: Abnormal eye movements  HPI: [name] is an 46 y.o. female with multiple medical problems including TBI and schizophrenia.  Patient intubated and sedated therefore unable to provide any history.  No family available.  All history obtained from the chart.  Patient was found unresponsive after an unknown duration of time by her roommate. Per EMS staff patient was shocked by fire department based on defibrillator suggestion and then patient was in V. fib when EMS arrived and CPR was in progress. She was gave another shock and patient was in PEA and received 3 rounds of epinephrine prior to ROSC.  Patient had ice protocol initiated and upon warming and start of weaning of sedation was noted to have upward deviation of her eyes with fluttering eyelids.  Consult and EEG ordered at that time.  While paralyzed and sedated EEG performed on yesterday that showed burst suppression activity.    Past Medical History:  Diagnosis Date  . Abnormal mammogram, unspecified 2013   Prev. cytology,hypercellular smears without evidence of malignant cells. The cytopathologist questioned if samples truly representative. Care taken during sampling and is felt to be representative.  . Acute renal failure (ARF) ([place])    per fl2  . Breast screening, unspecified 2013  . Broken leg    age 38  . Cellulitis   . Crohn's disease ([place]) 2013  . Depression   . Diabetes mellitus without complication (HCC)    non insulin dependent  . Early menopause   . Edema   . Encephalopathy    hepatic encephalopathy per fl2   . GERD (gastroesophageal reflux disease)   . Hiatal hernia 2013  . Hyperammonemia ([place])    per fl2  . Hyperlipidemia   . Hypertension   . Hypotension    per fl2  . Hypotension, chronic   . Hypothyroidism   . 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 Obesity, unspecified 2013  . Other sign and symptom in  breast 2013   Right bst US,lower outer quadrant,A single 0.3x0.4x0.6cm hypoechoic mass with slightly lobulated borders with adjacent 0.3x0.4x0.5cm mass was noted. The 1st was 5cm from nipple, 2nd at 8 cm from the nipple. The previous lesion aspirate was at 3 0'clock position. These lesions are thought to account for the mammographic abnormality. Minimal interval change on [place].   Marland Kitchen Recurrent cellulitis of lower leg   . Regional enteritis [facility])   . Rib fracture    age 73  . Scalp laceration    per fl2  . Schizophrenia ([place])   . Schizophrenia, undifferentiated ([place])    per fl2  . TBI (traumatic brain injury) ([place])   . Thyroid disease    hypothyroid  . Vitamin D deficiency     Past Surgical History:  Procedure Laterality Date  . COLONOSCOPY WITH PROPOFOL N/A 02/16/2016   Procedure: COLONOSCOPY WITH PROPOFOL;  Surgeon: Lollie Sails, MD;  Location: [facility] ENDOSCOPY;  Service: Endoscopy;  Laterality: N/A;  . COLONOSCOPY WITH PROPOFOL N/A 02/22/2016   Procedure: COLONOSCOPY WITH PROPOFOL;  Surgeon: Lollie Sails, MD;  Location: [facility] ENDOSCOPY;  Service: Endoscopy;  Laterality: N/A;  . COLONOSCOPY WITH PROPOFOL N/A 10/14/2016   Procedure: COLONOSCOPY WITH PROPOFOL;  Surgeon: Jonathon Bellows, MD;  Location: [facility] ENDOSCOPY;  Service: Endoscopy;  Laterality: N/A;  . EYE SURGERY Left 2013   cataract surgery  . PERIPHERAL VASCULAR CATHETERIZATION N/A 10/13/2016   Procedure: Dialysis/Perma Catheter  Insertion;  Surgeon: Algernon Huxley, MD;  Location: [place] CV LAB;  Service: Cardiovascular;  Laterality: N/A;  . PERIPHERAL VASCULAR CATHETERIZATION N/A 10/18/2016   Procedure: Dialysis/Perma Catheter Removal;  Surgeon: Katha Cabal, MD;  Location: [place] CV LAB;  Service: Cardiovascular;  Laterality: N/A;  . TUMOR EXCISION Right    age of 1, tumor removed from RUE    Family History  Problem Relation Age of Onset  . Cancer Mother 68       colon  . Cancer Paternal Aunt     Social  History:  reports that she has never smoked. She has never used smokeless tobacco. She reports that she does not drink alcohol or use drugs.  Allergies  Allergen Reactions  . Peanut Oil Other (See Comments)    Face turns red  . Risperidone And Related Cough    Medications:  I have reviewed the patient's current medications. Prior to Admission:  Prescriptions Prior to Admission  Medication Sig Dispense Refill Last Dose  . Amino Acids-Protein Hydrolys (FEEDING SUPPLEMENT, PRO-STAT SUGAR FREE 64,) LIQD Take 30 mLs by mouth 3 (three) times daily with meals.   Past Week at Unknown time  . calcium carbonate (OSCAL) 1500 (600 Ca) MG TABS tablet Take 1,500 mg by mouth 2 (two) times daily with a meal.   Past Week at Unknown time  . citalopram (CELEXA) 40 MG tablet Take 40 mg by mouth daily.   Past Week at Unknown time  . furosemide (LASIX) 40 MG tablet Take 1 tablet (40 mg total) by mouth daily. 30 tablet 0 Past Week at Unknown time  . gemfibrozil (LOPID) 600 MG tablet Take 600 mg by mouth 2 (two) times daily before a meal.   Past Week at Unknown time  . lactulose (CHRONULAC) 10 GM/15ML solution Take 45 mLs (30 g total) by mouth 2 (two) times daily. 240 mL 0 Past Week at Unknown time  . levothyroxine (SYNTHROID, LEVOTHROID) 50 MCG tablet Take 1 tablet (50 mcg total) by mouth daily before breakfast. 30 tablet 0 Past Week at Unknown time  . LORazepam (ATIVAN) 1 MG tablet Take 1 mg by mouth daily.   Past Week at Unknown time  . magnesium oxide (MAGNESIUM-OXIDE) 400 (241.3 Mg) MG tablet Take 400 mg by mouth 2 (two) times daily.   Past Week at Unknown time  . mesalamine (APRISO) 0.375 g 24 hr capsule Take 1,500 mg by mouth daily.   Past Week at Unknown time  . midodrine (PROAMATINE) 10 MG tablet Take 10 mg by mouth 4 (four) times daily.   Past Week at Unknown time  . Multiple Vitamin (MULTIVITAMIN WITH MINERALS) TABS tablet Take 1 tablet by mouth daily.   Past Week at Unknown time  . omega-3 acid ethyl  esters (LOVAZA) 1 g capsule Take 1 g by mouth daily.   Past Week at Unknown time  . pantoprazole (PROTONIX) 40 MG tablet Take 40 mg by mouth daily.   Past Week at Unknown time  . potassium chloride (K-DUR,KLOR-CON) 10 MEQ tablet Take 20 mEq by mouth 2 (two) times daily.    Past Week at Unknown time  . vitamin B-12 (CYANOCOBALAMIN) 1000 MCG tablet Take 1,000 mcg by mouth daily.   Past Week at Unknown time  . diphenhydrAMINE (BENADRYL) 25 MG tablet Take 50 mg by mouth at bedtime as needed.   Taking  . feeding supplement, ENSURE ENLIVE, (ENSURE ENLIVE) LIQD Take 237 mLs by mouth 2 (two) times daily between  meals. (Patient not taking: Reported on 04/22/2017) 237 mL 12 Not Taking at Unknown time   Scheduled: . chlorhexidine gluconate (MEDLINE KIT)  15 mL Mouth Rinse BID  . famotidine  20 mg Per Tube BID  . feeding supplement (VITAL HIGH PROTEIN)  1,000 mL Per Tube Q24H  . fentaNYL (SUBLIMAZE) injection  100 mcg Intravenous Once  . heparin  5,000 Units Subcutaneous Q8H  . insulin aspart  1 Units Subcutaneous Q4H  . insulin glargine  10 Units Subcutaneous Q24H  . mouth rinse  15 mL Mouth Rinse 10 times per day  . midazolam  2 mg Intravenous Once  . sodium chloride flush  10-40 mL Intracatheter Q12H    ROS: Patient unable to provide.  Physical Examination: Blood pressure (!) 81/70, pulse (!) 105, temperature 100.2 F (37.9 C), temperature source Core (Comment), resp. rate 14, weight 108.9 kg (240 lb 1.3 oz), SpO2 94 %.  HEENT-  Normocephalic, no lesions, without obvious abnormality.  Normal external eye and conjunctiva.  Normal TM's bilaterally.  Normal auditory canals and external ears. Normal external nose, mucus membranes and septum.  Normal pharynx. Cardiovascular- S1, S2 normal, pulses palpable throughout   Lungs- chest clear, no wheezing, rales, normal symmetric air entry Abdomen- soft, non-tender; bowel sounds normal; no masses,  no organomegaly Extremities- LE edema Lymph-no adenopathy  palpable Musculoskeletal-no joint tenderness, deformity or swelling Skin-evidence of LE cellulitis  Neurological Examination   Mental Status: Patient does not respond to verbal stimuli.  Does not respond to deep sternal rub.  Does not follow commands.  No verbalizations noted.  Cranial Nerves: II: patient does not respond confrontation bilaterally, pupils right 3 mm, left 3 mm,and unreactive bilaterally III,IV,VI: doll's response present bilaterally. On initial evaluation pupils with upward deviation and some nystagmus noted.  With stimulation pupils deviated to the left and eyelid flutter was noted.   V,VII: corneal reflex absent bilaterally  VIII: patient does not respond to verbal stimuli IX,X: gag reflex reduced, XI: trapezius strength unable to test bilaterally XII: tongue strength unable to test Motor: Extremities flaccid throughout.  No spontaneous movement noted.  No purposeful movements noted. Sensory: Does not respond to noxious stimuli in any extremity. Deep Tendon Reflexes:  1+ in the upper extremities and absent in the lower extremities Plantars: mute bilaterally Cerebellar: Unable to perform        Laboratory Studies:   Basic Metabolic Panel:  Recent Labs Lab 04/20/2017 2102  04/25/17 0713 04/25/17 1126  04/25/17 1951 04/25/17 2344 04/26/17 0515 04/26/17 0905 04/26/17 1301  NA 132*  < > 134* 134*  < > 135 135 134* 134* 129*  K 2.7*  < > 3.0* 3.8  < > 3.7 3.7 4.2 4.2 4.4  CL 99*  < > 103 106  < > 108 109 109 113* 109  CO2 24  < > 22 21*  < > 19* 20* 20* 17* 17*  GLUCOSE 151*  < > 126* 159*  < > 152* 132* 127* 112* 110*  BUN 5*  < > 5* <5*  < > 5* 5* <5* 5* 5*  CREATININE 1.07*  < > 1.14* 0.87  < > 0.90 0.93 0.79 0.99 0.94  CALCIUM 6.8*  < > 6.7* 6.8*  < > 6.5* 6.5* 6.5* 6.2* 6.0*  MG 1.5*  --  1.9  --   --   --   --   --  1.7 1.6*  PHOS  --   --   --  3.1  --   --   --   --   --   --   < > =  values in this interval not displayed.  Liver Function  Tests:  Recent Labs Lab 04/07/2017 1717 04/25/17 0713 04/26/17 0905  AST 204* 178*  --   ALT 56* 62*  --   ALKPHOS 179* 211*  --   BILITOT 0.8 0.9  --   PROT 3.7* 4.3*  --   ALBUMIN 1.2* 1.3* 1.0*   No results for input(s): LIPASE, AMYLASE in the last 168 hours.  Recent Labs Lab 04/26/2017 2102  AMMONIA 39*    CBC:  Recent Labs Lab 04/23/2017 1510 03/31/2017 1717  WBC 17.2* 27.7*  NEUTROABS 3.5 11.4*  HGB 10.4* 9.3*  HCT 30.7* 27.9*  MCV 89.9 94.0  PLT 442* 356    Cardiac Enzymes:  Recent Labs Lab 04/18/2017 2102 04/25/17 0111 04/25/17 0713 04/25/17 1313  TROPONINI 0.09* 0.13* 0.17* 0.14*    BNP: Invalid input(s): POCBNP  CBG:  Recent Labs Lab 04/25/17 2105 04/25/17 2348 04/26/17 0345 04/26/17 0719 04/26/17 1108  GLUCAP 141* 130* 100* 109* 101*    Microbiology: Results for orders placed or performed during the hospital encounter of 04/01/2017  Culture, blood (Routine x 2)     Status: None (Preliminary result)   Collection Time: 04/01/2017  5:45 PM  Result Value Ref Range Status   Specimen Description BLOOD CENTRAL LINE  Final   Special Requests   Final    BOTTLES DRAWN AEROBIC AND ANAEROBIC Blood Culture adequate volume   Culture NO GROWTH 2 DAYS  Final   Report Status PENDING  Incomplete  Culture, blood (Routine x 2)     Status: Abnormal (Preliminary result)   Collection Time: 04/13/2017  5:50 PM  Result Value Ref Range Status   Specimen Description BLOOD BLOOD RIGHT FOREARM  Final   Special Requests   Final    BOTTLES DRAWN AEROBIC AND ANAEROBIC Blood Culture adequate volume   Culture  Setup Time   Final    GRAM POSITIVE COCCI IN BOTH AEROBIC AND ANAEROBIC BOTTLES CRITICAL RESULT CALLED TO, READ BACK BY AND VERIFIED WITH: MELISSA Dawson 04/25/17 @ 1519  [place] Performed at [facility], [address], [place], [place] [address]    Culture STAPHYLOCOCCUS SPECIES (COAGULASE NEGATIVE) (A)  Final   Report Status PENDING  Incomplete  Blood  Culture ID Panel (Reflexed)     Status: Abnormal   Collection Time: 04/05/2017  5:50 PM  Result Value Ref Range Status   Enterococcus species NOT DETECTED NOT DETECTED Final   Listeria monocytogenes NOT DETECTED NOT DETECTED Final   Staphylococcus species DETECTED (A) NOT DETECTED Final    Comment: Methicillin (oxacillin) resistant coagulase negative staphylococcus. Possible blood culture contaminant (unless isolated from more than one blood culture draw or clinical case suggests pathogenicity). No antibiotic treatment is indicated for blood  culture contaminants. CRITICAL RESULT CALLED TO, READ BACK BY AND VERIFIED WITH: MELISSA Thomas 04/25/17 @ 7017  [place]    Staphylococcus aureus NOT DETECTED NOT DETECTED Final   Methicillin resistance DETECTED (A) NOT DETECTED Final    Comment: CRITICAL RESULT CALLED TO, READ BACK BY AND VERIFIED WITH: MELISSA Brooks 04/25/17 @ 7939  [place]    Streptococcus species NOT DETECTED NOT DETECTED Final   Streptococcus agalactiae NOT DETECTED NOT DETECTED Final   Streptococcus pneumoniae NOT DETECTED NOT DETECTED Final   Streptococcus pyogenes NOT DETECTED NOT DETECTED Final   Acinetobacter baumannii NOT DETECTED NOT DETECTED Final   Enterobacteriaceae species NOT DETECTED NOT DETECTED Final   Enterobacter cloacae complex NOT DETECTED NOT DETECTED Final  Escherichia coli NOT DETECTED NOT DETECTED Final   Klebsiella oxytoca NOT DETECTED NOT DETECTED Final   Klebsiella pneumoniae NOT DETECTED NOT DETECTED Final   Proteus species NOT DETECTED NOT DETECTED Final   Serratia marcescens NOT DETECTED NOT DETECTED Final   Haemophilus influenzae NOT DETECTED NOT DETECTED Final   Neisseria meningitidis NOT DETECTED NOT DETECTED Final   Pseudomonas aeruginosa NOT DETECTED NOT DETECTED Final   Candida albicans NOT DETECTED NOT DETECTED Final   Candida glabrata NOT DETECTED NOT DETECTED Final   Candida krusei NOT DETECTED NOT DETECTED Final   Candida parapsilosis NOT  DETECTED NOT DETECTED Final   Candida tropicalis NOT DETECTED NOT DETECTED Final  MRSA PCR Screening     Status: None   Collection Time: 04/13/2017  7:50 PM  Result Value Ref Range Status   MRSA by PCR NEGATIVE NEGATIVE Final    Comment:        The GeneXpert MRSA Assay (FDA approved for NASAL specimens only), is one component of a comprehensive MRSA colonization surveillance program. It is not intended to diagnose MRSA infection nor to guide or monitor treatment for MRSA infections.   Culture, Urine     Status: None   Collection Time: 04/13/2017  9:02 PM  Result Value Ref Range Status   Specimen Description URINE, RANDOM  Final   Special Requests NONE  Final   Culture   Final    NO GROWTH Performed at [facility], [address], [place], [place] [address]    Report Status 04/26/2017 FINAL  Final  Culture, respiratory (NON-Expectorated)     Status: None (Preliminary result)   Collection Time: 04/25/17  6:09 AM  Result Value Ref Range Status   Specimen Description TRACHEAL ASPIRATE  Final   Special Requests NONE  Final   Gram Stain   Final    RARE WBC PRESENT, PREDOMINANTLY MONONUCLEAR NO ORGANISMS SEEN    Culture   Final    CULTURE REINCUBATED FOR BETTER GROWTH Performed at [facility], [place] [address], [place], [place] [address]    Report Status PENDING  Incomplete    Coagulation Studies:  Recent Labs  04/03/2017 1717 04/25/17 1126  LABPROT 17.1* 15.1  INR 1.38 1.18    Urinalysis:  Recent Labs Lab 04/15/2017 1718  COLORURINE YELLOW*  LABSPEC 1.004*  PHURINE 6.0  GLUCOSEU NEGATIVE  HGBUR NEGATIVE  BILIRUBINUR NEGATIVE  KETONESUR NEGATIVE  PROTEINUR 30*  NITRITE NEGATIVE  LEUKOCYTESUR NEGATIVE    Lipid Panel:  No results found for: CHOL, TRIG, HDL, CHOLHDL, VLDL, LDLCALC  HgbA1C:  Lab Results  Component Value Date   HGBA1C 6.5 (H) 03/15/2012    Urine Drug Screen:     Component Value Date/Time   LABOPIA POSITIVE (A) 03/02/2017 1638    COCAINSCRNUR NONE DETECTED 03/02/2017 1638   LABBENZ NONE DETECTED 03/02/2017 1638   AMPHETMU NONE DETECTED 03/02/2017 1638   THCU NONE DETECTED 03/02/2017 1638   LABBARB NONE DETECTED 03/02/2017 1638    Alcohol Level: No results for input(s): ETH in the last 168 hours.  Other results: EKG: sinus bradycardia at 54 bpm with premature supraventricular complexes.  Imaging: Ct Head Wo Contrast  Result Date: 04/03/2017 CLINICAL DATA:  Unresponsive following CPR. EXAM: CT HEAD WITHOUT CONTRAST TECHNIQUE: Contiguous axial images were obtained from the base of the skull through the vertex without intravenous contrast. COMPARISON:  03/02/2017. FINDINGS: Brain: No evidence of acute infarction, hemorrhage, hydrocephalus, extra-axial collection or mass lesion/mass effect. Vascular: No hyperdense vessel  or unexpected calcification. Skull: Bilateral hyperostosis frontalis.  No acute abnormality. Sinuses/Orbits: Right sphenoid and left maxillary sinus mucosal thickening. Other: Previously noted scattered trichilemmal scalp cysts. IMPRESSION: No acute abnormality. 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 Electronically Signed   By: Claudie Revering M.D.   On: 04/03/2017 18:39   Dg Chest Portable 1 View  Result Date: 04/01/2017 CLINICAL DATA:  Status post CPR with intubation and central line placement. EXAM: PORTABLE CHEST 1 VIEW COMPARISON:  03/03/2017. FINDINGS: Poor inspiration. Normal sized heart. Clear lungs with normal vascularity. Endotracheal tube tip 2.3 cm above the carina. The right jugular catheter tip in the mid upper right atrium. Unremarkable bones. No pneumothorax. IMPRESSION: 1. No acute abnormality. 2. Right jugular catheter tip in the right atrium. This could be retracted 3 cm to place in the inferior aspect of the superior vena cava. Electronically Signed   By: Claudie Revering M.D.   On: 04/13/2017 18:32   Dg Abd Portable 1 View  Result Date: 04/23/2017 CLINICAL DATA:   Nasogastric tube placement. Status post CPR and intubation. EXAM: PORTABLE ABDOMEN - 1 VIEW COMPARISON:  09/27/2016. FINDINGS: Nasogastric tube tip and side hole in the proximal stomach. Normal bowel gas pattern. Old posttraumatic changes involving the left twelfth rib without change. No acute bony abnormality. IMPRESSION: No acute abnormality. Nasogastric tube tip and side hole in the proximal stomach. Electronically Signed   By: Claudie Revering M.D.   On: 04/16/2017 18:33     Assessment/Plan: 46 year old female s/p arrest and hypothermia protocol.  On attempt to discontinue sedation patient developed abnormal eye movements.  Was unable to come completely off sedation.  Despite this does not fulfill criteria for brain death.  Do suspect significant brain injury though.  Initial EEG showed burst suppression activity that could have been attributed to medications.  On repeat today this burst activity has less associated suppression.  Head CT reviewed and shows no acute changes.  Patient has now been normothermic since 1100 today.  Would continue monitoring although prognosis appears poor.    Recommendations: 1.  Keppra 2032m IV now with maintenance of 10023mq 12 hours 2.  Repeat head CT on 6/28   Case discussed with Dr. KaTilman NeatMD Neurology [id number]/27/2018, 3:33 PM

## 2017-04-26 NOTE — Progress Notes (Signed)
PULMONARY / CRITICAL CARE MEDICINE   Name: [name] MRN: [id number] DOB: 1971/02/16    ADMISSION DATE:  04/10/2017 CONSULTATION DATE: 03/31/2017  REFERRING MD: Dr. Posey Pronto   CHIEF COMPLAINT: Cardiac Arrest   Synopsis This is a 46 yo female with a PMH of Hypothyroidism, Traumatic Brain Injury, Schizophrenia, Regional Enteritis, Recurrent Bilateral Lower Extremity Cellulitis, Obesity, Chronic Hypotension, HTN, Hiatal Hernia, Hyperlipidemia, GERD, Hepatic Encephalopathy, Diabetes Mellitus, Hyperammonemia, Depression, Crohn's Disease, and Acute Renal Failure.  She presented to [facility] ER 06/25 post cardiac arrest from a group home.  Per ER notes the pts roommate found the pt unresponsive for an unknown duration of time, therefore EMS notified.  Per EMS staff fire department arrived prior to their arrival and defibrillator suggested cardiac rhythm should be shocked, therefore one shock administered.  Once EMS arrived the pt went into a Vfibb arrest and CPR was in progress by fire department.  She was shocked a 2nd time and then went into PEA arrest, therefore she received 3 rounds of epinephrine with ROSC, estimated total downtime 25 minutes. EMS placed a King airway in the field.  In the ER the pt was mechanically intubated.  Lab results revealed Na+ 131, K+ 2.9, lactic acid 10.3, wbc 27.7, and hgb 9.3. Pt admitted by hospitalist team to ICU for further workup and treatment. PCCM consulted for hypothermic protocol and vent management.   History of present illness Patient remains intubated Patient remains sedated Patient is critically ill Patient on vasopressor support Patient is on hypothermia protocol   REVIEW OF SYSTEMS:   Unable to assess pt intubated   SUBJECTIVE:  Unable to assess pt intubated.   VITAL SIGNS: BP (!) 81/70   Pulse (!) 109   Temp (!) 96.8 F (36 C) (Core (Comment))   Resp 14   Wt 240 lb 1.3 oz (108.9 kg)   SpO2 94%   BMI 40.49 kg/m   HEMODYNAMICS: CVP:  [0  mmHg-10 mmHg] 3 mmHg  VENTILATOR SETTINGS: Vent Mode: PRVC FiO2 (%):  [30 %-35 %] 30 % Set Rate:  [14 bmp] 14 bmp Vt Set:  [500 mL] 500 mL PEEP:  [5 cmH20] 5 cmH20 Plateau Pressure:  [15 cmH20] 15 cmH20  INTAKE / OUTPUT: I/O last 3 completed shifts: In: 7634.3 [I.V.:6802; NG/GT:382.3; IV Piggyback:450] Out: 2050 [Urine:2050]  PHYSICAL EXAMINATION: General: acutely ill appearing Caucasian female, mechanically intubated  Neuro: not following commands, PERRL, No Doll's eyes, No corneal reflex (but on versed, fentanyl, and Nimbex gtt's) HEENT: supple, no JVD Cardiovascular: s1s2, rrr, no M/R/G Lungs: course throughout, symmetrical expansion, non labored Abdomen: +BS x4, soft, obese, non tender, non distended  Musculoskeletal: normal tone, 2+ bilateral generalized edema Skin: chronic vascular skin changes bilateral lower extremities  LABS:  BMET  Recent Labs Lab 04/25/17 1951 04/25/17 2344 04/26/17 0515  NA 135 135 134*  K 3.7 3.7 4.2  CL 108 109 109  CO2 19* 20* 20*  BUN 5* 5* <5*  CREATININE 0.90 0.93 0.79  GLUCOSE 152* 132* 127*    Electrolytes  Recent Labs Lab 04/08/2017 2102  04/25/17 0713 04/25/17 1126  04/25/17 1951 04/25/17 2344 04/26/17 0515  CALCIUM 6.8*  < > 6.7* 6.8*  < > 6.5* 6.5* 6.5*  MG 1.5*  --  1.9  --   --   --   --   --   PHOS  --   --   --  3.1  --   --   --   --   < > =  values in this interval not displayed.  CBC  Recent Labs Lab 04/05/2017 1510 04/16/2017 1717  WBC 17.2* 27.7*  HGB 10.4* 9.3*  HCT 30.7* 27.9*  PLT 442* 356    Coag's  Recent Labs Lab 04/18/2017 1717 04/13/2017 2102 04/25/17 0418 04/25/17 1126  APTT  --  49* 39* 46*  INR 1.38  --   --  1.18    Sepsis Markers  Recent Labs Lab 04/13/2017 1717 04/29/2017 2102 04/25/17 0713 04/26/17 0515  LATICACIDVEN 10.3* 5.5*  --   --   PROCALCITON  --  0.72 1.50 2.42    ABG  Recent Labs Lab 04/20/2017 1850 04/25/17 1030  PHART 7.34* 7.39  PCO2ART 37 31*  PO2ART 115*  121*    Liver Enzymes  Recent Labs Lab 04/12/2017 1717 04/25/17 0713  AST 204* 178*  ALT 56* 62*  ALKPHOS 179* 211*  BILITOT 0.8 0.9  ALBUMIN 1.2* 1.3*    Cardiac Enzymes  Recent Labs Lab 04/25/17 0111 04/25/17 0713 04/25/17 1313  TROPONINI 0.13* 0.17* 0.14*    Glucose  Recent Labs Lab 04/25/17 1919 04/25/17 2002 04/25/17 2105 04/25/17 2348 04/26/17 0345 04/26/17 0719  GLUCAP 145* 156* 141* 130* 100* 109*    Imaging No results found. STUDIES:  CT Head 06/25>>No acute abnormality. 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  CULTURES: Blood 06/25>> Urine 06/25>> Respiratory 06/26>>  ANTIBIOTICS: Vancomycin 06/25 x1 dose Zosyn 06/25 x1 dose  Zosyn 06/26>>  SIGNIFICANT EVENTS: 06/25-Pt admitted to ICU post cardiac arrest mechanically intubated.  06/25- Targeted Temperature Management (TTM) initiated, Goal Temperature of 33C met @ 2225 06/26- TTM continues, to begin rewarming phase at 2225 06/27-TTM continues on rewarming phase  LINES/TUBES: Right IJ 06/25>> ETT 06/25>> Right Femoral A-line 06/25>>  ASSESSMENT / PLAN:  This is a 46 y.o. Female who presents with V-fib/PEA cardiac arrest, probably secondary to ischemic cardiomyopathy, now with subsequent Cardiogenic Shock requiring vasopressors and multi-organ failure.  PULMONARY A: Acute cardiorespiratory arrest (Vfib/PEA arrest)  Mechanical Intubation  Hx: Obesity  P:   Full vent support  Hypothermia protocol @33  C -rewarming phase at this time VAP bundle  Repeat ABG and CXR in am   CARDIOVASCULAR A:  Acute cardiorespiratory arrest (Vfib and PEA arrest) likely secondary to ischemic cardiomyopathy Hx: HTN, Chronic hypotension, and Hyperlipidemia P:  Cardiology consulted appreciate input-per cardiology no indication for emergent cardiac catheterization at this time.    Trend troponin's Elevated troponin's likely secondary to Cardiac arrest and CPR Levophed gtt to  maintain map greater or equal to 80 mmHg during hypothermia protocol  Continuous telemetry monitoring  RENAL A:   Hypokalemia  Severe Lactic Acidosis-Improving  Hx: Acute renal failure  P:   Trend CMP Replace electrolytes as indicated Monitor UOP Trend lactic acid  CVP's q4hr NS w/40K @100  ml/hr   GASTROINTESTINAL A:   Elevated liver enzymes-Slightly improving Hx: Regional enteritis, GERD, Chr and Hiatal Hernia  P:   Keep NPO for now  Trend liver enzymes Pepcid for PUD prophylaxis   HEMATOLOGIC A:   Anemia without acute blood loss  P:  Trend CBC Subq heparin for VTE prophylaxis  Transfuse for hgb <7 Trend APTT per hypothermic protocol  Monitor for s/sx of bleeding   INFECTIOUS A:   Leukocytosis  P:   Trend WBC and monitor fever curve Trend lactic acid and PCT Follow cultures Abx as above  ENDOCRINE A:   Diabetes Mellitus Hx: Hypothyroidism  P:   CBG's q1hr during hypothermic protocol  Insulin  gtt Thyroid panel with TSH pending   NEUROLOGIC A:   Hepatic Encephalopathy  Hx: Schizophrenia, Depression, Traumatic Brain Injury, Hyperammonemia, and Encephalopathy  P:   RASS goal: -5 for duration of neuromuscular blockade therapy  Nimbex, Versed, and Fentanyl gtts to maintain RASS goal  WUA post hypothermia protocol completion       Critical Care Time devoted to patient care services described in this note is 43 minutes.   Overall, patient is critically ill, prognosis is guarded.  Patient with Multiorgan failure and at high risk for cardiac arrest and death.    Corrin Parker, M.D.  [facility]  Medical Director [place] Director [facility] Cardio-Pulmonary Department

## 2017-04-26 NOTE — Progress Notes (Signed)
After further assessment of patients neuro function and critical illness, patient showing signs of severe anoxic brain injury. I have discussed findings with HCPOA [name] and after further discussion, the HCPOA has agreed and consented to DNR status.  Will place DNR orders.   Family  satisfied with Plan of action and management. All questions answered  Corrin Parker, M.D.  [facility]  Medical Director [place] Director [facility] Cardio-Pulmonary Department

## 2017-04-26 NOTE — Progress Notes (Signed)
Patient ID: [name], female   DOB: August 01, 1971, 46 y.o.   MRN: [id number]   [employer] PROGRESS NOTE  [name]:[id number] DOB: 27-Feb-1971 DOA: 04/16/2017 PCP: Remi Haggard, FNP  HPI/Subjective: Patient unable to give any history.  Objective: Vitals:   04/26/17 1500 04/26/17 1600  BP:    Pulse: (!) 104 (!) 103  Resp: 16 15  Temp: 99.1 F (37.3 C) 98.2 F (36.8 C)    Filed Weights   04/20/2017 1715 04/26/17 0200  Weight: 103.6 kg (228 lb 6.4 oz) 108.9 kg (240 lb 1.3 oz)    ROS: Review of Systems  Unable to perform ROS: Critical illness   Exam: Physical Exam  HENT:  Nose: No mucosal edema.  Eyes: Conjunctivae and lids are normal.  Pupils pinpoint  Neck: No JVD present. Carotid bruit is not present. No edema present. No thyroid mass and no thyromegaly present.  Cardiovascular: S1 normal and S2 normal.  Exam reveals no gallop.   No murmur heard. Pulses:      Dorsalis pedis pulses are 2+ on the right side, and 2+ on the left side.  Respiratory: No respiratory distress. She has decreased breath sounds in the right lower field and the left lower field. She has no wheezes. She has no rhonchi. She has no rales.  GI: Soft. Bowel sounds are normal. There is no tenderness.  Musculoskeletal:       Right ankle: She exhibits swelling.       Left ankle: She exhibits swelling.  Lymphadenopathy:    She has no cervical adenopathy.  Neurological:  Unresponsive to painful stimuli  Skin: Nails show no clubbing.  Chronic lower extremity discoloration bilaterally  Psychiatric:  Unresponsive to painful stimuli      Data Reviewed: Basic Metabolic Panel:  Recent Labs Lab 04/02/2017 2102  04/25/17 0713 04/25/17 1126  04/25/17 1951 04/25/17 2344 04/26/17 0515 04/26/17 0905 04/26/17 1301  NA 132*  < > 134* 134*  < > 135 135 134* 134* 129*  K 2.7*  < > 3.0* 3.8  < > 3.7 3.7 4.2 4.2 4.4  CL 99*  < > 103 106  < > 108 109 109 113* 109  CO2 24  < > 22 21*   < > 19* 20* 20* 17* 17*  GLUCOSE 151*  < > 126* 159*  < > 152* 132* 127* 112* 110*  BUN 5*  < > 5* <5*  < > 5* 5* <5* 5* 5*  CREATININE 1.07*  < > 1.14* 0.87  < > 0.90 0.93 0.79 0.99 0.94  CALCIUM 6.8*  < > 6.7* 6.8*  < > 6.5* 6.5* 6.5* 6.2* 6.0*  MG 1.5*  --  1.9  --   --   --   --   --  1.7 1.6*  PHOS  --   --   --  3.1  --   --   --   --   --   --   < > = values in this interval not displayed. Liver Function Tests:  Recent Labs Lab 04/05/2017 1717 04/25/17 0713 04/26/17 0905  AST 204* 178*  --   ALT 56* 62*  --   ALKPHOS 179* 211*  --   BILITOT 0.8 0.9  --   PROT 3.7* 4.3*  --   ALBUMIN 1.2* 1.3* 1.0*    Recent Labs Lab 04/12/2017 2102  AMMONIA 39*   CBC:  Recent Labs Lab 04/22/2017 1510 04/05/2017 1717  WBC 17.2* 27.7*  NEUTROABS 3.5 11.4*  HGB 10.4* 9.3*  HCT 30.7* 27.9*  MCV 89.9 94.0  PLT 442* 356   Cardiac Enzymes:  Recent Labs Lab 04/16/2017 2102 04/25/17 0111 04/25/17 0713 04/25/17 1313  TROPONINI 0.09* 0.13* 0.17* 0.14*    CBG:  Recent Labs Lab 04/25/17 2105 04/25/17 2348 04/26/17 0345 04/26/17 0719 04/26/17 1108  GLUCAP 141* 130* 100* 109* 101*    Recent Results (from the past 240 hour(s))  Culture, blood (Routine x 2)     Status: None (Preliminary result)   Collection Time: 04/09/2017  5:45 PM  Result Value Ref Range Status   Specimen Description BLOOD CENTRAL LINE  Final   Special Requests   Final    BOTTLES DRAWN AEROBIC AND ANAEROBIC Blood Culture adequate volume   Culture NO GROWTH 2 DAYS  Final   Report Status PENDING  Incomplete  Culture, blood (Routine x 2)     Status: Abnormal (Preliminary result)   Collection Time: 04/23/2017  5:50 PM  Result Value Ref Range Status   Specimen Description BLOOD BLOOD RIGHT FOREARM  Final   Special Requests   Final    BOTTLES DRAWN AEROBIC AND ANAEROBIC Blood Culture adequate volume   Culture  Setup Time   Final    GRAM POSITIVE COCCI IN BOTH AEROBIC AND ANAEROBIC BOTTLES CRITICAL RESULT CALLED  TO, READ BACK BY AND VERIFIED WITH: MELISSA [place] 04/25/17 @ 1519  [place] Performed at [facility], [address], [place], [place] [address]    Culture STAPHYLOCOCCUS SPECIES (COAGULASE NEGATIVE) (A)  Final   Report Status PENDING  Incomplete  Blood Culture ID Panel (Reflexed)     Status: Abnormal   Collection Time: 04/09/2017  5:50 PM  Result Value Ref Range Status   Enterococcus species NOT DETECTED NOT DETECTED Final   Listeria monocytogenes NOT DETECTED NOT DETECTED Final   Staphylococcus species DETECTED (A) NOT DETECTED Final    Comment: Methicillin (oxacillin) resistant coagulase negative staphylococcus. Possible blood culture contaminant (unless isolated from more than one blood culture draw or clinical case suggests pathogenicity). No antibiotic treatment is indicated for blood  culture contaminants. CRITICAL RESULT CALLED TO, READ BACK BY AND VERIFIED WITH: MELISSA [place] 04/25/17 @ 5597  [place]    Staphylococcus aureus NOT DETECTED NOT DETECTED Final   Methicillin resistance DETECTED (A) NOT DETECTED Final    Comment: CRITICAL RESULT CALLED TO, READ BACK BY AND VERIFIED WITH: MELISSA [place] 04/25/17 @ 4163  [place]    Streptococcus species NOT DETECTED NOT DETECTED Final   Streptococcus agalactiae NOT DETECTED NOT DETECTED Final   Streptococcus pneumoniae NOT DETECTED NOT DETECTED Final   Streptococcus pyogenes NOT DETECTED NOT DETECTED Final   Acinetobacter baumannii NOT DETECTED NOT DETECTED Final   Enterobacteriaceae species NOT DETECTED NOT DETECTED Final   Enterobacter cloacae complex NOT DETECTED NOT DETECTED Final   Escherichia coli NOT DETECTED NOT DETECTED Final   Klebsiella oxytoca NOT DETECTED NOT DETECTED Final   Klebsiella pneumoniae NOT DETECTED NOT DETECTED Final   Proteus species NOT DETECTED NOT DETECTED Final   Serratia marcescens NOT DETECTED NOT DETECTED Final   Haemophilus influenzae NOT DETECTED NOT DETECTED Final   Neisseria meningitidis NOT DETECTED  NOT DETECTED Final   Pseudomonas aeruginosa NOT DETECTED NOT DETECTED Final   Candida albicans NOT DETECTED NOT DETECTED Final   Candida glabrata NOT DETECTED NOT DETECTED Final   Candida krusei NOT DETECTED NOT DETECTED Final   Candida parapsilosis NOT DETECTED NOT DETECTED  Final   Candida tropicalis NOT DETECTED NOT DETECTED Final  MRSA PCR Screening     Status: None   Collection Time: 04/09/2017  7:50 PM  Result Value Ref Range Status   MRSA by PCR NEGATIVE NEGATIVE Final    Comment:        The GeneXpert MRSA Assay (FDA approved for NASAL specimens only), is one component of a comprehensive MRSA colonization surveillance program. It is not intended to diagnose MRSA infection nor to guide or monitor treatment for MRSA infections.   Culture, Urine     Status: None   Collection Time: 04/27/2017  9:02 PM  Result Value Ref Range Status   Specimen Description URINE, RANDOM  Final   Special Requests NONE  Final   Culture   Final    NO GROWTH Performed at [facility], [address], [place], [place] [address]    Report Status 04/26/2017 FINAL  Final  Culture, respiratory (NON-Expectorated)     Status: None (Preliminary result)   Collection Time: 04/25/17  6:09 AM  Result Value Ref Range Status   Specimen Description TRACHEAL ASPIRATE  Final   Special Requests NONE  Final   Gram Stain   Final    RARE WBC PRESENT, PREDOMINANTLY MONONUCLEAR NO ORGANISMS SEEN    Culture   Final    CULTURE REINCUBATED FOR BETTER GROWTH Performed at [facility], [place] [address] [place], [place] [address]    Report Status PENDING  Incomplete     Studies: Ct Head Wo Contrast  Result Date: 04/12/2017 CLINICAL DATA:  Unresponsive following CPR. EXAM: CT HEAD WITHOUT CONTRAST TECHNIQUE: Contiguous axial images were obtained from the base of the skull through the vertex without intravenous contrast. COMPARISON:  03/02/2017. FINDINGS: Brain: No evidence of acute infarction, hemorrhage,  hydrocephalus, extra-axial collection or mass lesion/mass effect. Vascular: No hyperdense vessel or unexpected calcification. Skull: Bilateral hyperostosis frontalis.  No acute abnormality. Sinuses/Orbits: Right sphenoid and left maxillary sinus mucosal thickening. Other: Previously noted scattered trichilemmal scalp cysts. IMPRESSION: No acute abnormality. 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 Electronically Signed   By: Claudie Revering M.D.   On: 04/02/2017 18:39   Dg Chest Portable 1 View  Result Date: 04/02/2017 CLINICAL DATA:  Status post CPR with intubation and central line placement. EXAM: PORTABLE CHEST 1 VIEW COMPARISON:  03/03/2017. FINDINGS: Poor inspiration. Normal sized heart. Clear lungs with normal vascularity. Endotracheal tube tip 2.3 cm above the carina. The right jugular catheter tip in the mid upper right atrium. Unremarkable bones. No pneumothorax. IMPRESSION: 1. No acute abnormality. 2. Right jugular catheter tip in the right atrium. This could be retracted 3 cm to place in the inferior aspect of the superior vena cava. Electronically Signed   By: Claudie Revering M.D.   On: 04/19/2017 18:32   Dg Abd Portable 1 View  Result Date: 04/07/2017 CLINICAL DATA:  Nasogastric tube placement. Status post CPR and intubation. EXAM: PORTABLE ABDOMEN - 1 VIEW COMPARISON:  09/27/2016. FINDINGS: Nasogastric tube tip and side hole in the proximal stomach. Normal bowel gas pattern. Old posttraumatic changes involving the left twelfth rib without change. No acute bony abnormality. IMPRESSION: No acute abnormality. Nasogastric tube tip and side hole in the proximal stomach. Electronically Signed   By: Claudie Revering M.D.   On: 04/06/2017 18:33    Scheduled Meds: . chlorhexidine gluconate (MEDLINE KIT)  15 mL Mouth Rinse BID  . famotidine  20 mg Per Tube BID  .  feeding supplement (VITAL HIGH PROTEIN)  1,000 mL Per Tube Q24H  . fentaNYL (SUBLIMAZE) injection  100 mcg Intravenous Once   . heparin  5,000 Units Subcutaneous Q8H  . insulin aspart  1 Units Subcutaneous Q4H  . insulin glargine  10 Units Subcutaneous Q24H  . mouth rinse  15 mL Mouth Rinse 10 times per day  . midazolam  2 mg Intravenous Once  . sodium chloride flush  10-40 mL Intracatheter Q12H   Continuous Infusions: . sodium chloride 10 mL/hr at 04/26/17 0900  . sodium chloride 100 mL/hr at 04/26/17 1017  . cisatracurium (NIMBEX) infusion Stopped (04/26/17 0740)  . dextrose    . fentaNYL 50 mcg/hr (04/26/17 1439)  . [START ON 04/27/2017] levETIRAcetam    . midazolam (VERSED) infusion 4 mg/hr (04/26/17 1345)  . norepinephrine (LEVOPHED) Adult infusion 70 mcg/min (04/26/17 1629)  . piperacillin-tazobactam (ZOSYN)  IV Stopped (04/26/17 1420)  . vasopressin (PITRESSIN) infusion - *FOR SHOCK* 0.04 Units/min (04/26/17 0800)    Assessment/Plan:  1. Cardiopulmonary arrest. With hypokalemia could be arrhythmia related. Supportive care 2. Acute hypoxic respiratory failure. Continue ventilatory support. FiO2 30%. Sedation as per critical care specialist 3. Severe lactic acidosis. Continue ventilatory support. 4. Shock on levophed and vasopressin. Blood culture being positive likely skin contaminant 5. Type 2 diabetes mellitus on insulin drip 6. Acute encephalopathy. EEG ordered.  7. Just got called with positive blood culture which could be contamination. The patient is on Zosyn and did receive 1 dose of vancomycin already. Follow-up final results of blood cultures. Pharmacist will call critical care specialist for further orders. 8. Demand ischemia with elevated troponin secondary to cardiac arrest 9. Morbid obesity 10. Hypokalemia. Replaced 11. Elevated liver enzymes secondary to cardiopulmonary arrest 12. Anemia continue to watch blood counts closely 13. Smudge cells on CBC could represent CLL patient with leukocytosis also  Code Status:     Code Status Orders        Start     Ordered   04/27/2017 1945   Full code  Continuous     04/23/2017 1944    Code Status History    Date Active Date Inactive Code Status Order ID Comments User Context   04/28/2017  7:22 PM 04/20/2017  7:44 PM Full Code [id number]  Flora Lipps, MD ED   04/03/2017  5:23 PM 04/05/2017  2:40 PM Full Code [id number]  Gladstone Lighter, MD Inpatient   03/02/2017  7:14 PM 03/07/2017  3:32 PM Full Code [id number]  Loletha Grayer, MD ED   02/14/2017  1:22 AM 02/15/2017  5:07 PM Full Code [id number]  Harvie Bridge, DO Inpatient   01/23/2017  5:26 PM 01/28/2017  5:54 PM Full Code [id number]  Demetrios Loll, MD Inpatient   09/27/2016  8:45 AM 10/18/2016  8:43 PM Full Code [id number]  Flora Lipps, MD ED     Family Communication: As per critical care specialist  Disposition Plan: to be determined based on clinical course  Consultants:  Cardiology  Critical care specialist  Antibiotics:  Zosyn   Time spent: 24 minutes  Loletha Grayer  [employer]

## 2017-04-26 NOTE — Progress Notes (Signed)
Lab reported calcium level of 6.3.  NP made aware.  Corrected value was 8.7.  No new orders at this time.

## 2017-04-26 NOTE — Progress Notes (Signed)
Dr. Mortimer Fries notified of critical calcium level. Albumin level now being checked. Wilnette Kales

## 2017-04-26 NOTE — Progress Notes (Signed)
Dr. Mortimer Fries notified that patient is requiring 80 mcg of levo to maintain MAP of only 70. MAP goal changed to 65 at this time since patient is almost at target temp. Wilnette Kales

## 2017-04-26 NOTE — Progress Notes (Signed)
Pt had a reported 57 beat run of vtach.  NP made aware.  Stat BMP was ordered.

## 2017-04-26 NOTE — Progress Notes (Signed)
Patient's eyes started to deviate upwards and continuously "blinking" after sedation was stopped. Dr. Mortimer Fries notified and gave verbal order for STAT EEG. Order placed, and also gave order to keep sedation off until after EEG. Patient's HR increased to 120's and QRS widened, Dr. Mortimer Fries also notified of this and gave verbal to check magnesium add on with 0900 labs. Next set of labs due at 1300. Wilnette Kales

## 2017-04-27 ENCOUNTER — Inpatient Hospital Stay: Payer: Medicare Other

## 2017-04-27 ENCOUNTER — Inpatient Hospital Stay (HOSPITAL_COMMUNITY)
Admit: 2017-04-27 | Discharge: 2017-04-27 | Disposition: A | Discharge status: Home / Self Care | Payer: Medicare Other | Attending: Physician Assistant | Admitting: Physician Assistant

## 2017-04-27 DIAGNOSIS — G931 Anoxic brain damage, not elsewhere classified: Secondary | ICD-10-CM

## 2017-04-27 DIAGNOSIS — I469 Cardiac arrest, cause unspecified: Secondary | ICD-10-CM

## 2017-04-27 LAB — CULTURE, RESPIRATORY

## 2017-04-27 LAB — BASIC METABOLIC PANEL
ANION GAP: 3 — AB (ref 5–15)
BUN: 6 mg/dL (ref 6–20)
CALCIUM: 6.3 mg/dL — AB (ref 8.9–10.3)
CO2: 18 mmol/L — ABNORMAL LOW (ref 22–32)
CREATININE: 0.93 mg/dL (ref 0.44–1.00)
Chloride: 113 mmol/L — ABNORMAL HIGH (ref 101–111)
Glucose, Bld: 119 mg/dL — ABNORMAL HIGH (ref 65–99)
Potassium: 4.4 mmol/L (ref 3.5–5.1)
SODIUM: 134 mmol/L — AB (ref 135–145)

## 2017-04-27 LAB — ECHOCARDIOGRAM COMPLETE
HEIGHTINCHES: 68 in
WEIGHTICAEL: 4112.9 [oz_av]

## 2017-04-27 LAB — COMP PANEL: LEUKEMIA/LYMPHOMA: IMMUNOPHENOTYPIC PROFILE: 0

## 2017-04-27 LAB — CULTURE, BLOOD (ROUTINE X 2): SPECIAL REQUESTS: ADEQUATE

## 2017-04-27 LAB — GLUCOSE, CAPILLARY
GLUCOSE-CAPILLARY: 69 mg/dL (ref 65–99)
GLUCOSE-CAPILLARY: 90 mg/dL (ref 65–99)
GLUCOSE-CAPILLARY: 92 mg/dL (ref 65–99)
Glucose-Capillary: 106 mg/dL — ABNORMAL HIGH (ref 65–99)
Glucose-Capillary: 65 mg/dL (ref 65–99)
Glucose-Capillary: 81 mg/dL (ref 65–99)
Glucose-Capillary: 85 mg/dL (ref 65–99)
Glucose-Capillary: 90 mg/dL (ref 65–99)

## 2017-04-27 LAB — CULTURE, RESPIRATORY W GRAM STAIN: Culture: NORMAL

## 2017-04-27 LAB — CBC
HCT: 35.5 % (ref 35.0–47.0)
Hemoglobin: 11.7 g/dL — ABNORMAL LOW (ref 12.0–16.0)
MCH: 31.5 pg (ref 26.0–34.0)
MCHC: 32.9 g/dL (ref 32.0–36.0)
MCV: 95.8 fL (ref 80.0–100.0)
PLATELETS: 302 10*3/uL (ref 150–440)
RBC: 3.71 MIL/uL — AB (ref 3.80–5.20)
RDW: 15.8 % — AB (ref 11.5–14.5)
WBC: 18.4 10*3/uL — AB (ref 3.6–11.0)

## 2017-04-27 LAB — MAGNESIUM: MAGNESIUM: 2 mg/dL (ref 1.7–2.4)

## 2017-04-27 LAB — JAK2 GENOTYPR

## 2017-04-27 MED ORDER — VITAL HIGH PROTEIN PO LIQD: 1000.0000 mL | ORAL | Status: DC

## 2017-04-27 MED ORDER — VALPROATE SODIUM 500 MG/5ML IV SOLN
1000.0000 mg | Freq: Once | INTRAVENOUS | Status: AC
  Administered 2017-04-27: 1000 mg via INTRAVENOUS
  Filled 2017-04-27: qty 10

## 2017-04-27 MED ORDER — SENNOSIDES 8.8 MG/5ML PO SYRP
5.0000 mL | ORAL_SOLUTION | Freq: Two times a day (BID) | ORAL | Status: DC
  Administered 2017-04-27 (×2): 5 mL
  Filled 2017-04-27 (×4): qty 5

## 2017-04-27 MED ORDER — VITAL HIGH PROTEIN PO LIQD
1000.0000 mL | ORAL | Status: DC
  Administered 2017-04-27: 1000 mL

## 2017-04-27 MED ORDER — DOCUSATE SODIUM 50 MG/5ML PO LIQD
100.0000 mg | Freq: Two times a day (BID) | ORAL | Status: DC
  Administered 2017-04-27 (×2): 100 mg
  Filled 2017-04-27 (×2): qty 10

## 2017-04-27 MED ORDER — DEXTROSE 50 % IV SOLN
INTRAVENOUS | Status: AC
  Administered 2017-04-28: 50 mL via INTRAVENOUS
  Filled 2017-04-27: qty 50

## 2017-04-27 MED ORDER — VALPROATE SODIUM 500 MG/5ML IV SOLN
500.0000 mg | Freq: Three times a day (TID) | INTRAVENOUS | Status: DC
  Administered 2017-04-27 – 2017-04-28 (×2): 500 mg via INTRAVENOUS
  Filled 2017-04-27 (×4): qty 5

## 2017-04-27 MED ORDER — SODIUM CHLORIDE 0.9 % IV SOLN
1500.0000 mg | Freq: Two times a day (BID) | INTRAVENOUS | Status: DC
  Administered 2017-04-27 – 2017-04-28 (×2): 1500 mg via INTRAVENOUS
  Filled 2017-04-27 (×3): qty 15

## 2017-04-27 NOTE — Progress Notes (Signed)
[facility] went to visit with patient on rounds. Offered silent prayer at door, as patient was not awake.     04/27/17 1455  Clinical Encounter Type  Visited With Patient  Visit Type Initial;Spiritual support  Referral From Chaplain  Consult/Referral To Chaplain  Spiritual Encounters  Spiritual Needs Prayer

## 2017-04-27 NOTE — Progress Notes (Signed)
[place] [facility] [place] NAME: [name]    MR#:  [id number]  DATE OF BIRTH:  [birth year]  SUBJECTIVE:   Patient intubated sedated  REVIEW OF SYSTEMS:    Unable to obtain patient intubated and sedated  Tolerating Diet: Tube feedings      DRUG ALLERGIES:   Allergies  Allergen Reactions  . Peanut Oil Other (See Comments)    Face turns red  . Risperidone And Related Cough    VITALS:  Blood pressure (!) 84/56, pulse 86, temperature 98.4 F (36.9 C), temperature source Core (Comment), resp. rate 14, height 5\' 8"  (1.727 m), weight 116.6 kg (257 lb 0.9 oz), SpO2 96 %.  PHYSICAL EXAMINATION:  Constitutional: Appears well-developed  Intubated and sedated  HENT: Normocephalic. . Intubated   Eyes: Conjunctivae and EOM are normal. PERRLA, no scleral icterus.  Neck:  No JVD. No tracheal deviation. CVS: RRR, S1/S2 +, no murmurs, no gallops, no carotid bruit.  Pulmonary: Decreased breath sounds throughout lung fields. No wheezing or rhonchi   Abdominal: Soft. BS +,  no distension, tenderness, rebound or guarding.  Musculoskeletal: Normal range of motion. 2+ bilateral lower extremity edema  Neuro: Sedated on vent without gag reflex. Unresponsive to pain  Skin: then a stasis changes  Psychiatric: Sedated   LABORATORY PANEL:   CBC  Recent Labs Lab 04/27/17 0433  WBC 18.4*  HGB 11.7*  HCT 35.5  PLT 302   ------------------------------------------------------------------------------------------------------------------  Chemistries   Recent Labs Lab 04/25/17 0713  04/27/17 0433  NA 134*  < > 134*  K 3.0*  < > 4.4  CL 103  < > 113*  CO2 22  < > 18*  GLUCOSE 126*  < > 119*  BUN 5*  < > 6  CREATININE 1.14*  < > 0.93  CALCIUM 6.7*  < > 6.3*  MG 1.9  < > 2.0  AST 178*  --   --   ALT 62*  --   --   ALKPHOS 211*  --   --   BILITOT 0.9  --   --   < > = values in this interval not  displayed. ------------------------------------------------------------------------------------------------------------------  Cardiac Enzymes  Recent Labs Lab 04/25/17 0111 04/25/17 0713 04/25/17 1313  TROPONINI 0.13* 0.17* 0.14*   ------------------------------------------------------------------------------------------------------------------  RADIOLOGY:  No results found.   ASSESSMENT AND PLAN:   46 year old female with a history of TBI and diabetes who presents with cardiopulmonary arrest.   1. Cardiopulmonary arrest (V. fib/PA arrest): Unclear etiology possible hypokalemia contributing  Continue hypothermia protocol Appreciate cardiology consult  2. Acute hypoxic respiratory failure requiring mechanical intubation with acute encephalopathy: Assessment of patient's neurological function shows signs of severe anoxic brain injury Appreciate neurology consultation Plan for head CT Continue Keppra Ventilator management as per intensivist  3. Cardiogenic shock: Continue pressors to keep map greater than 65  4. Diabetes: Continue Lantus  5. Nutrition: Patient is on vital high protein  6. Elevated troponin due to demand ischemia    7. Hypocalcemia: Needs replacement   Patient has very poor overall prognosis.   Management plans discussed with nurising  CODE STATUS:  DNR  TOTAL TIME TAKING CARE OF THIS PATIENT: 30 minutes.     POSSIBLE D/C ??, DEPENDING ON CLINICAL CONDITION.   [name] M.D on 04/27/2017 at 7:11 AM  Between 7am to 6pm - Pager - [phone] After 6pm go to [email or web address] - Proofreader  [employer]  [phone]  CC: Primary care physician; Remi Haggard, FNP  Note: This dictation was prepared with Dragon dictation along with smaller phrase technology. Any transcriptional errors that result from this process are unintentional.

## 2017-04-27 NOTE — Progress Notes (Signed)
Patient Name: [name] Date of Encounter: 04/27/2017  Primary Cardiologist: New to [facility] - consult by End  Hospital Problem List     Principal Problem:   Cardiopulmonary arrest [facility]) Active Problems:   Undifferentiated schizophrenia ([place])   Hypokalemia   Hypomagnesemia   Prolonged Q-T interval on ECG   Elevated troponin   Multi-organ failure with liver failure ([place])   AKI (acute kidney injury) ([place])   TBI (traumatic brain injury) ([place])   Anoxic brain injury ([place])     Subjective   Remains intubated and sedated. TTE with new cardiomyopathy with an EF of 20-25%,akinesis of the apicalanteroseptal, anterior, inferior, and apical myocardium, GR1DD, mild to moderate MR, trivial pericardial effusion was noted. Has completed [facility] protocol. EEG planned for tomorrow. Remains on Levophed gtt. BP soft. Mag at goal. Potassium at goal. Renal function improving. Ca low. Leukocytosis improving. HGB improved.   Inpatient Medications    Scheduled Meds: . chlorhexidine gluconate (MEDLINE KIT)  15 mL Mouth Rinse BID  . sennosides  5 mL Per Tube BID   And  . docusate  100 mg Per Tube BID  . famotidine  20 mg Per Tube BID  . fentaNYL (SUBLIMAZE) injection  100 mcg Intravenous Once  . heparin  5,000 Units Subcutaneous Q8H  . insulin aspart  1 Units Subcutaneous Q4H  . insulin glargine  10 Units Subcutaneous Q24H  . mouth rinse  15 mL Mouth Rinse 10 times per day  . midazolam  2 mg Intravenous Once  . sodium chloride flush  10-40 mL Intracatheter Q12H   Continuous Infusions: . sodium chloride 10 mL/hr at 04/26/17 1600  . sodium chloride 100 mL/hr at 04/27/17 0637  . cisatracurium (NIMBEX) infusion Stopped (04/26/17 0740)  . dextrose    . feeding supplement (VITAL HIGH PROTEIN) 1,000 mL (04/27/17 1101)  . fentaNYL 50 mcg/hr (04/27/17 0551)  . levETIRAcetam    . midazolam (VERSED) infusion 4 mg/hr (04/27/17 0552)  . norepinephrine (LEVOPHED) Adult infusion 42 mcg/min  (04/27/17 1235)  . piperacillin-tazobactam (ZOSYN)  IV 3.375 g (04/27/17 1015)  . vasopressin (PITRESSIN) infusion - *FOR SHOCK* 0.04 Units/min (04/27/17 0134)   PRN Meds: acetaminophen **OR** acetaminophen, atropine, [COMPLETED] cisatracurium **AND** cisatracurium (NIMBEX) infusion **AND** cisatracurium, dextrose, fentaNYL, midazolam, ondansetron **OR** ondansetron (ZOFRAN) IV, sodium chloride flush   Vital Signs    Vitals:   04/27/17 0800 04/27/17 0900 04/27/17 1100 04/27/17 1119  BP:      Pulse: 76 80 81   Resp: 14 14 14 14   Temp: 99.1 F (37.3 C)     TempSrc:      SpO2: 97% 97% 96% 96%  Weight:      Height:        Intake/Output Summary (Last 24 hours) at 04/27/17 1243 Last data filed at 04/27/17 1015  Gross per 24 hour  Intake           2834.5 ml  Output              655 ml  Net           2179.5 ml   Filed Weights   04/26/17 0200 04/26/17 1600 04/27/17 0349  Weight: 240 lb 1.3 oz (108.9 kg) 240 lb (108.9 kg) 257 lb 0.9 oz (116.6 kg)    Physical Exam    GEN: Critically ill appearing.  HEENT: Grossly normal.  Neck: Supple, no JVD, carotid bruits, or masses. Cardiac: Distant heart sounds, RRR, II/VI systolic murmur, no rubs,  or gallops. No clubbing, cyanosis, edema.  Radials/DP/PT 2+ and equal bilaterally.  Respiratory:  Diminished breath sounds bilaterally. Intubated.  GI: Soft, nontender, nondistended, BS + x 4. MS: no deformity or atrophy. Skin: warm and dry, no rash. Neuro:  Intubated and sedated. Psych: Intubated and sedated.  Labs    CBC  Recent Labs  04/25/2017 1510 04/15/2017 1717 04/27/17 0433  WBC 17.2* 27.7* 18.4*  NEUTROABS 3.5 11.4*  --   HGB 10.4* 9.3* 11.7*  HCT 30.7* 27.9* 35.5  MCV 89.9 94.0 95.8  PLT 442* 356 665   Basic Metabolic Panel  Recent Labs  04/25/17 1126  04/26/17 1301 04/26/17 2202 04/27/17 0433  NA 134*  < > 129* 135 134*  K 3.8  < > 4.4 4.5 4.4  CL 106  < > 109 113* 113*  CO2 21*  < > 17* 17* 18*  GLUCOSE 159*  < >  110* 118* 119*  BUN <5*  < > 5* 6 6  CREATININE 0.87  < > 0.94 1.03* 0.93  CALCIUM 6.8*  < > 6.0* 6.3* 6.3*  MG  --   < > 1.6*  --  2.0  PHOS 3.1  --   --   --   --   < > = values in this interval not displayed. Liver Function Tests  Recent Labs  04/17/2017 1717 04/25/17 0713 04/26/17 0905  AST 204* 178*  --   ALT 56* 62*  --   ALKPHOS 179* 211*  --   BILITOT 0.8 0.9  --   PROT 3.7* 4.3*  --   ALBUMIN 1.2* 1.3* 1.0*   No results for input(s): LIPASE, AMYLASE in the last 72 hours. Cardiac Enzymes  Recent Labs  04/25/17 0111 04/25/17 0713 04/25/17 1313  TROPONINI 0.13* 0.17* 0.14*   BNP Invalid input(s): POCBNP D-Dimer No results for input(s): DDIMER in the last 72 hours. Hemoglobin A1C No results for input(s): HGBA1C in the last 72 hours. Fasting Lipid Panel No results for input(s): CHOL, HDL, LDLCALC, TRIG, CHOLHDL, LDLDIRECT in the last 72 hours. Thyroid Function Tests  Recent Labs  04/19/2017 2102  TSH 10.090*  T4TOTAL 3.0*    Telemetry    NSR, 70s bpm - Personally Reviewed  ECG    n/a - Personally Reviewed  Radiology    No results found.  Cardiac Studies   TTE 04/27/2017: Study Conclusions  - Left ventricle: The cavity size was normal. Wall thickness was   normal. Systolic function was severely reduced. The estimated   ejection fraction was in the range of 20% to 25%. Akinesis of the   apicalanteroseptal, anterior, inferior, and apical myocardium.   Doppler parameters are consistent with abnormal left ventricular   relaxation (grade 1 diastolic dysfunction). - Mitral valve: There was mild to moderate regurgitation. - Pericardium, extracardiac: A trivial pericardial effusion was   identified posterior to the heart.  Impressions:  - Significant drop in EF since March. Wall motion abnormalities   suggestive if ischemic vs. stress induced cardiomyopathy.  Patient Profile     46 y.o. female with history of schizophrenia not on any home  antipsychotics, TBI, hepatic encephalopathy on lactulose, hypotension on midodrine, obesity, cellulitis, HTN, HLD, and GERD who was found unresponsive at her group home for an uncertain duration on 9/93 and found to have VF arrest upon EMS arrival.  Assessment & Plan    1. Cardiopulmonary arrest: -Patient was found down at her living facility for an unknown amount of time -VF  arrest in the field with defibrillation x 2 in the field along with CPR for at least a total down time of 25 minutes coupled with 3 rounds of epi -Possibly in the setting of her electrolyte abnormalities and prolonged QTc -Intubation per PCCM -At time of admission there was no indication for urgent LHC -Completed [place] protocol  -Neuro on board and planning for EEG on 6/29  2. Prolonged QTc: -VF arrest as above -Cannot rule out possibility of torsades given her prolonged QTc and PVCs noted -Recommend holding QT prolonging medications if able, will defer to PCCM/primary service for this -Magnesium has been repleted -No antipsychotics as below -No known ABX or AAT or nausea/vomiting medications -Check EKG to evaluate QTc  3. Acute systolic CHF: -Prior echo in 12/2016 with an EF of 60-65%. Echo today with an EF of 20-25% with AK of the apicalanteroseptal, anterior, inferior, and apical myocardium -The WMA are suggestive of ischemia vs stress-induced cardiomyopathy -Her hypotension precludes addition of any evidence based HF medications at this time, start when able -If EEG shows possible meaningful recovery on 6/29 consider LHC at that time   4. Elevated troponin: -Echo as above -Plan for LHC on 6/29 if there is possible meaningful recovery, though overall she appears to have a poor prognosis  -Not on heparin gtt 2/2 anemia (stable now)  5. Hypokalemia: -Repleted  6. Multiorgan failure: -As above  7. Schizophrenia: -Not on any antipsychotics as an outpatient  8. TBI: -Per  PCCM   Signed, Christell Faith, PA-C [place] Pager: [phone] 04/27/2017, 12:43 PM

## 2017-04-27 NOTE — Progress Notes (Signed)
PULMONARY / CRITICAL CARE MEDICINE   Name: [name] MRN: [id number] DOB: [birth year]    ADMISSION DATE:  04/22/2017 CONSULTATION DATE: 04/16/2017  REFERRING MD: Dr. Posey Pronto   CHIEF COMPLAINT: Cardiac Arrest   Synopsis This is a 46 yo female with a PMH of Hypothyroidism, Traumatic Brain Injury, Schizophrenia, Regional Enteritis, Recurrent Bilateral Lower Extremity Cellulitis, Obesity, Chronic Hypotension, HTN, Hiatal Hernia, Hyperlipidemia, GERD, Hepatic Encephalopathy, Diabetes Mellitus, Hyperammonemia, Depression, Crohn's Disease, and Acute Renal Failure.  She presented to [facility] ER 06/25 post cardiac arrest from a group home.  Per ER notes the pts roommate found the pt unresponsive for an unknown duration of time, therefore EMS notified.  Per EMS staff fire department arrived prior to their arrival and defibrillator suggested cardiac rhythm should be shocked, therefore one shock administered.  Once EMS arrived the pt went into a Vfibb arrest and CPR was in progress by fire department.  She was shocked a 2nd time and then went into PEA arrest, therefore she received 3 rounds of epinephrine with ROSC, estimated total downtime 25 minutes. EMS placed a [place] airway in the field.  In the ER the pt was mechanically intubated.  Lab results revealed Na+ 131, K+ 2.9, lactic acid 10.3, wbc 27.7, and hgb 9.3. Pt admitted by hospitalist team to ICU for further workup and treatment. PCCM consulted for hypothermic protocol and vent management.    History of present illness Patient remains intubated Patient remains sedated Patient is critically ill Patient on vasopressor support Patient is on hypothermia protocol Findings to suggest severe and active brain injury Follow up neurology recommendations   REVIEW OF SYSTEMS:   Unable to assess pt intubated   SUBJECTIVE:  Unable to assess pt intubated.   VITAL SIGNS: BP (!) 84/56 (BP Location: Other (Comment)) Comment (BP Location): a line right groin   Pulse 86   Temp 98.4 F (36.9 C) (Core (Comment))   Resp 14   Ht _0  (1.727 m)   Wt 116.6 kg (257 lb 0.9 oz)   SpO2 96%   BMI 39.09 kg/m   HEMODYNAMICS: CVP:  [3 mmHg-13 mmHg] 10 mmHg  VENTILATOR SETTINGS: Vent Mode: PRVC FiO2 (%):  [30 %] 30 % Set Rate:  [14 bmp] 14 bmp Vt Set:  [500 mL] 500 mL PEEP:  [5 cmH20] 5 cmH20 Plateau Pressure:  [14 cmH20] 14 cmH20  INTAKE / OUTPUT: I/O last 3 completed shifts: In: 10014.3 [I.V.:6134.3; NG/GT:480; IV Piggyback:3400] Out: 825 [Urine:825]  PHYSICAL EXAMINATION: General: acutely ill appearing Caucasian female, mechanically intubated  Neuro: not following commands, PERRL, No Doll's eyes, No corneal reflex (but on versed, fentanyl, and Nimbex gtt's) HEENT: supple, no JVD Cardiovascular: s1s2, rrr, no M/R/G Lungs: course throughout, symmetrical expansion, non labored Abdomen: +BS x4, soft, obese, non tender, non distended  Musculoskeletal: normal tone, 2+ bilateral generalized edema Skin: chronic vascular skin changes bilateral lower extremities  LABS:  BMET  Recent Labs Lab 04/26/17 1301 04/26/17 2202 04/27/17 0433  NA 129* 135 134*  K 4.4 4.5 4.4  CL 109 113* 113*  CO2 17* 17* 18*  BUN 5* 6 6  CREATININE 0.94 1.03* 0.93  GLUCOSE 110* 118* 119*    Electrolytes  Recent Labs Lab 04/25/17 1126  04/26/17 0905 04/26/17 1301 04/26/17 2202 04/27/17 0433  CALCIUM 6.8*  < > 6.2* 6.0* 6.3* 6.3*  MG  --   --  1.7 1.6*  --  2.0  PHOS 3.1  --   --   --   --   --   < > =  values in this interval not displayed.  CBC  Recent Labs Lab 04/16/2017 1510 04/22/2017 1717 04/27/17 0433  WBC 17.2* 27.7* 18.4*  HGB 10.4* 9.3* 11.7*  HCT 30.7* 27.9* 35.5  PLT 442* 356 302    Coag's  Recent Labs Lab 04/19/2017 1717 04/28/2017 2102 04/25/17 0418 04/25/17 1126  APTT  --  49* 39* 46*  INR 1.38  --   --  1.18    Sepsis Markers  Recent Labs Lab 04/16/2017 1717 04/22/2017 2102 04/25/17 0713 04/26/17 0515  LATICACIDVEN  10.3* 5.5*  --   --   PROCALCITON  --  0.72 1.50 2.42    ABG  Recent Labs Lab 04/16/2017 1850 04/25/17 1030  PHART 7.34* 7.39  PCO2ART 37 31*  PO2ART 115* 121*    Liver Enzymes  Recent Labs Lab 04/19/2017 1717 04/25/17 0713 04/26/17 0905  AST 204* 178*  --   ALT 56* 62*  --   ALKPHOS 179* 211*  --   BILITOT 0.8 0.9  --   ALBUMIN 1.2* 1.3* 1.0*    Cardiac Enzymes  Recent Labs Lab 04/25/17 0111 04/25/17 0713 04/25/17 1313  TROPONINI 0.13* 0.17* 0.14*    Glucose  Recent Labs Lab 04/26/17 1108 04/26/17 1626 04/26/17 1955 04/26/17 2137 04/27/17 0011 04/27/17 0339  GLUCAP 101* 126* 99 111* 92 106*    Imaging No results found. STUDIES:  CT Head 06/25>>No acute abnormality. 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  CULTURES: Blood 06/25>> Urine 06/25>> Respiratory 06/26>>  ANTIBIOTICS: Vancomycin 06/25 x1 dose Zosyn 06/25 x1 dose  Zosyn 06/26>>  SIGNIFICANT EVENTS: 06/25-Pt admitted to ICU post cardiac arrest mechanically intubated.  06/25- Targeted Temperature Management (TTM) initiated, Goal Temperature of 33C met @ 2225 06/26- TTM continues, to begin rewarming phase at 2225 06/27-TTM continues on rewarming phase 06/28-discussion with healthcare power of attorney patient is now a DNR  LINES/TUBES: Right IJ 06/25>> ETT 06/25>> Right Femoral A-line 06/25>>  ASSESSMENT / PLAN: This is a 46 y.o. Female who presents with V-fib/PEA cardiac arrest, probably secondary to ischemic cardiomyopathy, now with subsequent Cardiogenic Shock requiring vasopressors and multi-organ failure.  PULMONARY A: Acute cardiorespiratory arrest (Vfib/PEA arrest)  Mechanical Intubation  Hx: Obesity  P:   Full vent support  Hypothermia protocol _0  C completed VAP bundle    CARDIOVASCULAR A:  Acute cardiorespiratory arrest (Vfib and PEA arrest) likely secondary to ischemic cardiomyopathy Hx: HTN, Chronic hypotension, and Hyperlipidemia P:   Elevated troponin's likely secondary to Cardiac arrest and CPR Levophed gtt to maintain map greater or equal to 80 mmHg during hypothermia protocol  Continuous telemetry monitoring  RENAL A:   Hypokalemia  Severe Lactic Acidosis-Improving  Hx: Acute renal failure  P:   Trend CMP Replace electrolytes as indicated Monitor UOP   GASTROINTESTINAL A:   Elevated liver enzymes-Slightly improving Hx: Regional enteritis, GERD, Chr and Hiatal Hernia  P:   Pepcid for PUD prophylaxis   HEMATOLOGIC A:   Anemia without acute blood loss  P:  Trend CBC Subq heparin for VTE prophylaxis  Transfuse for hgb <7 Trend APTT per hypothermic protocol  Monitor for s/sx of bleeding   INFECTIOUS A:   Leukocytosis  P:   Trend WBC and monitor fever curve Trend lactic acid and PCT Follow cultures Abx as above  ENDOCRINE A:   Diabetes Mellitus Hx: Hypothyroidism  P:   CBG's q1hr during hypothermic protocol  Insulin gtt   NEUROLOGIC Findings to suggest severe anoxic brain injury Follow up neurology recommendations  Critical Care Time devoted to patient care services described in this note is 39 minutes.   Overall, patient is critically ill, prognosis is guarded.  Patient with Multiorgan failure and at high risk for cardiac arrest and death.     Corrin Parker, M.D.  [facility]  Medical Director [place] Director [facility] Cardio-Pulmonary Department

## 2017-04-27 NOTE — Progress Notes (Signed)
Nutrition Follow-up  DOCUMENTATION CODES:   Not applicable  INTERVENTION:  1. Increase Vital High protein by 10 every 8 hours to goal rate of 9mL/hr 2. At goal, provides 1320 calories, 116 grams protein, 1104cc free water  NUTRITION DIAGNOSIS:   Inadequate oral intake related to inability to eat as evidenced by NPO status. -ongoing  GOAL:   Provide needs based on ASPEN/SCCM guidelines -progressing  MONITOR:   Labs, Weight trends, Vent status, I & O's, TF tolerance  ASSESSMENT:   Patient found unresponsive at the group home. She underwent CPR with shock in the field. She is currently intubated on the ventilator. No family. Unable to provide any history or review of systems since patient is intubated   Patient is currently intubated on ventilator support MV: 7 L/min Temp (24hrs), Avg:98.6 F (37 C), Min:97.2 F (36.2 C), Max:100.2 F (37.9 C) Propofol: none Waiting on family to arrive. No meaningful changes noted. Patient remains on multiple pressors, multi-organ failure. Labs and medications reviewed: Na 134, Albumin 1, LFTs elevated NS @ 121mL/hr Fentanyl gtt, Versed gtt, Levo gtt, Vaso gtt  Diet Order:  Diet NPO time specified  Skin:  Wound (see comment) (Abrasion to arm and leg)  Last BM:  04/25/2017  Height:   Ht Readings from Last 1 Encounters:  04/27/17 5\' 8"  (1.727 m)    Weight:   Wt Readings from Last 1 Encounters:  04/27/17 257 lb 0.9 oz (116.6 kg)    Ideal Body Weight:  55.7 kg  BMI:  Body mass index is 39.09 kg/m.  Estimated Nutritional Needs:   Kcal:  1140-1450 calories  Protein:  >/= 111 grams  Fluid:  Per MD/NP/PA  EDUCATION NEEDS:   Education needs no appropriate at this time  Satira Anis. Nyeemah Jennette, MS, RD LDN Inpatient Clinical Dietitian Pager [phone]

## 2017-04-27 NOTE — Progress Notes (Signed)
Pharmacy Antibiotic Note/ Electrolyte Monitoring/ Constipation Prevention  [name] is a 46 y.o. female admitted on 04/22/2017 s/p cardiac arrest on empiric antibiotics with elevated PCT and leukocytosis.  Pharmacy has been consulted for vancomycin dosing as well as electrolyte monitoring and constipation prevention. Patient is ventilated, sedated, and on enteral feedings.   Plan: 1. Continue Zosyn 3.375g IV q8h (4 hour infusion).   2. Electrolytes are WNL. Will f/u AM labs.   3. Will initiate a bowel regimen with senna/docusate liquid VT bid.   Height: 5\' 8"  (172.7 cm) Weight: 257 lb 0.9 oz (116.6 kg) IBW/kg (Calculated) : 63.9  Temp (24hrs), Avg:98.6 F (37 C), Min:97.2 F (36.2 C), Max:100.2 F (37.9 C)   Recent Labs Lab 04/05/2017 1510  04/16/2017 1717 04/08/2017 2102  04/26/17 0515 04/26/17 0905 04/26/17 1301 04/26/17 2202 04/27/17 0433  WBC 17.2*  --  27.7*  --   --   --   --   --   --  18.4*  CREATININE  --   < > 0.98 1.07*  < > 0.79 0.99 0.94 1.03* 0.93  LATICACIDVEN  --   --  10.3* 5.5*  --   --   --   --   --   --   < > = values in this interval not displayed.  Estimated Creatinine Clearance: 102.5 mL/min (by C-G formula based on SCr of 0.93 mg/dL).    Allergies  Allergen Reactions  . Peanut Oil Other (See Comments)    Face turns red  . Risperidone And Related Cough   BMP Latest Ref Rng & Units 04/27/2017 04/26/2017 04/26/2017  Glucose 65 - 99 mg/dL 119(H) 118(H) 110(H)  BUN 6 - 20 mg/dL 6 6 5(L)  Creatinine 0.44 - 1.00 mg/dL 0.93 1.03(H) 0.94  Sodium 135 - 145 mmol/L 134(L) 135 129(L)  Potassium 3.5 - 5.1 mmol/L 4.4 4.5 4.4  Chloride 101 - 111 mmol/L 113(H) 113(H) 109  CO2 22 - 32 mmol/L 18(L) 17(L) 17(L)  Calcium 8.9 - 10.3 mg/dL 6.3(LL) 6.3(LL) 6.0(LL)   Albumin (g/dL)  Date Value  04/26/2017 1.0 (L)  11/23/2012 3.0 (L)   Magnesium (mg/dL)  Date Value  04/27/2017 2.0  08/09/2012 1.3 (L)   Phosphorus (mg/dL)  Date Value  04/25/2017 3.1   08/09/2012 4.8    CCa= 8.7  Antimicrobials this admission: Vancomycin 6/25  X 1 Zosyn 6/25 >>   Dose adjustments this admission:   Microbiology results: 6/25 BCx: CNS 1/2 6/25 UCx: negative  6/26 TA: pending  6/25 MRSA PCR: negative  Thank you for allowing pharmacy to be a part of this patient's care.  Ulice Dash D 04/27/2017 10:34 AM

## 2017-04-27 NOTE — Progress Notes (Signed)
Subjective: Patient unchanged.  Remains intubated and sedated..    Objective: Current vital signs: BP (!) 84/56 (BP Location: Other (Comment)) Comment (BP Location): a line right groin  Pulse 76   Temp 99.1 F (37.3 C)   Resp 14   Ht 5' 8"  (1.727 m)   Wt 116.6 kg (257 lb 0.9 oz)   SpO2 97%   BMI 39.09 kg/m  Vital signs in last 24 hours: Temp:  [97.2 F (36.2 C)-100.2 F (37.9 C)] 99.1 F (37.3 C) (06/28 0800) Pulse Rate:  [76-106] 76 (06/28 0800) Resp:  [14-19] 14 (06/28 0800) BP: (84)/(56) 84/56 (06/27 1900) SpO2:  [91 %-100 %] 97 % (06/28 0800) Arterial Line BP: (85-105)/(56-70) 100/63 (06/28 0800) FiO2 (%):  [30 %] 30 % (06/28 0721) Weight:  [108.9 kg (240 lb)-116.6 kg (257 lb 0.9 oz)] 116.6 kg (257 lb 0.9 oz) (06/28 0349)  Intake/Output from previous day: 06/27 0701 - 06/28 0700 In: 7085.4 [I.V.:3515.4; NG/GT:220; IV Piggyback:3350] Out: 655 [Urine:655] Intake/Output this shift: No intake/output data recorded. Nutritional status: Diet NPO time specified  Neurologic Exam: Mental Status: Patient does not respond to verbal stimuli.  Does not respond to deep sternal rub.  Does not follow commands.  No verbalizations noted.  Cranial Nerves: II: patient does not respond confrontation bilaterally, pupils right 3 mm, left 3 mm,and unreactive bilaterally III,IV,VI: doll's response present bilaterally. On initial evaluation pupils with upward deviation.  With stimulation pupils deviated to the left and eyelid flutter.   V,VII: corneal reflex absent bilaterally  VIII: patient does not respond to verbal stimuli IX,X: gag reflex reduced, XI: trapezius strength unable to test bilaterally XII: tongue strength unable to test Motor: Extremities flaccid throughout.  No spontaneous movement noted.  No purposeful movements noted. Sensory: Does not respond to noxious stimuli in any extremity. Deep Tendon Reflexes:  1+ in the upper extremities and absent in the lower  extremities Plantars: mute bilaterally Cerebellar: Unable to perform  Lab Results: Basic Metabolic Panel:  Recent Labs Lab 04/27/2017 2102  04/25/17 0713 04/25/17 1126  04/26/17 0515 04/26/17 0905 04/26/17 1301 04/26/17 2202 04/27/17 0433  NA 132*  < > 134* 134*  < > 134* 134* 129* 135 134*  K 2.7*  < > 3.0* 3.8  < > 4.2 4.2 4.4 4.5 4.4  CL 99*  < > 103 106  < > 109 113* 109 113* 113*  CO2 24  < > 22 21*  < > 20* 17* 17* 17* 18*  GLUCOSE 151*  < > 126* 159*  < > 127* 112* 110* 118* 119*  BUN 5*  < > 5* <5*  < > <5* 5* 5* 6 6  CREATININE 1.07*  < > 1.14* 0.87  < > 0.79 0.99 0.94 1.03* 0.93  CALCIUM 6.8*  < > 6.7* 6.8*  < > 6.5* 6.2* 6.0* 6.3* 6.3*  MG 1.5*  --  1.9  --   --   --  1.7 1.6*  --  2.0  PHOS  --   --   --  3.1  --   --   --   --   --   --   < > = values in this interval not displayed.  Liver Function Tests:  Recent Labs Lab 04/02/2017 1717 04/25/17 0713 04/26/17 0905  AST 204* 178*  --   ALT 56* 62*  --   ALKPHOS 179* 211*  --   BILITOT 0.8 0.9  --   PROT 3.7* 4.3*  --  ALBUMIN 1.2* 1.3* 1.0*   No results for input(s): LIPASE, AMYLASE in the last 168 hours.  Recent Labs Lab 04/10/2017 2102  AMMONIA 39*    CBC:  Recent Labs Lab 04/18/2017 1510 04/14/2017 1717 04/27/17 0433  WBC 17.2* 27.7* 18.4*  NEUTROABS 3.5 11.4*  --   HGB 10.4* 9.3* 11.7*  HCT 30.7* 27.9* 35.5  MCV 89.9 94.0 95.8  PLT 442* 356 302    Cardiac Enzymes:  Recent Labs Lab 04/14/2017 2102 04/25/17 0111 04/25/17 0713 04/25/17 1313  TROPONINI 0.09* 0.13* 0.17* 0.14*    Lipid Panel: No results for input(s): CHOL, TRIG, HDL, CHOLHDL, VLDL, LDLCALC in the last 168 hours.  CBG:  Recent Labs Lab 04/26/17 1955 04/26/17 2137 04/27/17 0011 04/27/17 0339 04/27/17 0730  GLUCAP 99 111* 92 106* 35    Microbiology: Results for orders placed or performed during the hospital encounter of 04/16/2017  Culture, blood (Routine x 2)     Status: None (Preliminary result)    Collection Time: 04/21/2017  5:45 PM  Result Value Ref Range Status   Specimen Description BLOOD CENTRAL LINE  Final   Special Requests   Final    BOTTLES DRAWN AEROBIC AND ANAEROBIC Blood Culture adequate volume   Culture NO GROWTH 3 DAYS  Final   Report Status PENDING  Incomplete  Culture, blood (Routine x 2)     Status: Abnormal   Collection Time: 04/13/2017  5:50 PM  Result Value Ref Range Status   Specimen Description BLOOD BLOOD RIGHT FOREARM  Final   Special Requests   Final    BOTTLES DRAWN AEROBIC AND ANAEROBIC Blood Culture adequate volume   Culture  Setup Time   Final    GRAM POSITIVE COCCI IN BOTH AEROBIC AND ANAEROBIC BOTTLES CRITICAL RESULT CALLED TO, READ BACK BY AND VERIFIED WITH: MELISSA [place] 04/25/17 @ 10  [place]    Culture (A)  Final    STAPHYLOCOCCUS SPECIES (COAGULASE NEGATIVE) THE SIGNIFICANCE OF ISOLATING THIS ORGANISM FROM A SINGLE SET OF BLOOD CULTURES WHEN MULTIPLE SETS ARE DRAWN IS UNCERTAIN. PLEASE NOTIFY THE MICROBIOLOGY DEPARTMENT WITHIN ONE WEEK IF SPECIATION AND SENSITIVITIES ARE REQUIRED. Performed at [facility], [place] [address] [place], [place] [address]    Report Status 04/27/2017 FINAL  Final  Blood Culture ID Panel (Reflexed)     Status: Abnormal   Collection Time: 04/25/2017  5:50 PM  Result Value Ref Range Status   Enterococcus species NOT DETECTED NOT DETECTED Final   Listeria monocytogenes NOT DETECTED NOT DETECTED Final   Staphylococcus species DETECTED (A) NOT DETECTED Final    Comment: Methicillin (oxacillin) resistant coagulase negative staphylococcus. Possible blood culture contaminant (unless isolated from more than one blood culture draw or clinical case suggests pathogenicity). No antibiotic treatment is indicated for blood  culture contaminants. CRITICAL RESULT CALLED TO, READ BACK BY AND VERIFIED WITH: MELISSA [place] 04/25/17 @ 0174  [place]    Staphylococcus aureus NOT DETECTED NOT DETECTED Final   Methicillin resistance DETECTED  (A) NOT DETECTED Final    Comment: CRITICAL RESULT CALLED TO, READ BACK BY AND VERIFIED WITH: MELISSA [place] 04/25/17 @ 9449  [place]    Streptococcus species NOT DETECTED NOT DETECTED Final   Streptococcus agalactiae NOT DETECTED NOT DETECTED Final   Streptococcus pneumoniae NOT DETECTED NOT DETECTED Final   Streptococcus pyogenes NOT DETECTED NOT DETECTED Final   Acinetobacter baumannii NOT DETECTED NOT DETECTED Final   Enterobacteriaceae species NOT DETECTED NOT DETECTED Final   Enterobacter cloacae complex NOT  DETECTED NOT DETECTED Final   Escherichia coli NOT DETECTED NOT DETECTED Final   Klebsiella oxytoca NOT DETECTED NOT DETECTED Final   Klebsiella pneumoniae NOT DETECTED NOT DETECTED Final   Proteus species NOT DETECTED NOT DETECTED Final   Serratia marcescens NOT DETECTED NOT DETECTED Final   Haemophilus influenzae NOT DETECTED NOT DETECTED Final   Neisseria meningitidis NOT DETECTED NOT DETECTED Final   Pseudomonas aeruginosa NOT DETECTED NOT DETECTED Final   Candida albicans NOT DETECTED NOT DETECTED Final   Candida glabrata NOT DETECTED NOT DETECTED Final   Candida krusei NOT DETECTED NOT DETECTED Final   Candida parapsilosis NOT DETECTED NOT DETECTED Final   Candida tropicalis NOT DETECTED NOT DETECTED Final  MRSA PCR Screening     Status: None   Collection Time: 04/07/2017  7:50 PM  Result Value Ref Range Status   MRSA by PCR NEGATIVE NEGATIVE Final    Comment:        The GeneXpert MRSA Assay (FDA approved for NASAL specimens only), is one component of a comprehensive MRSA colonization surveillance program. It is not intended to diagnose MRSA infection nor to guide or monitor treatment for MRSA infections.   Culture, Urine     Status: None   Collection Time: 04/26/2017  9:02 PM  Result Value Ref Range Status   Specimen Description URINE, RANDOM  Final   Special Requests NONE  Final   Culture   Final    NO GROWTH Performed at [facility], [address] [place], [place] [address]    Report Status 04/26/2017 FINAL  Final  Culture, respiratory (NON-Expectorated)     Status: None (Preliminary result)   Collection Time: 04/25/17  6:09 AM  Result Value Ref Range Status   Specimen Description TRACHEAL ASPIRATE  Final   Special Requests NONE  Final   Gram Stain   Final    RARE WBC PRESENT, PREDOMINANTLY MONONUCLEAR NO ORGANISMS SEEN    Culture   Final    CULTURE REINCUBATED FOR BETTER GROWTH Performed at [facility], [place] [address], [place], [place] [address]    Report Status PENDING  Incomplete    Coagulation Studies:  Recent Labs  04/27/2017 1717 04/25/17 1126  LABPROT 17.1* 15.1  INR 1.38 1.18    Imaging: No results found.  Medications:  I have reviewed the patient's current medications. Scheduled: . chlorhexidine gluconate (MEDLINE KIT)  15 mL Mouth Rinse BID  . sennosides  5 mL Per Tube BID   And  . docusate  100 mg Per Tube BID  . famotidine  20 mg Per Tube BID  . fentaNYL (SUBLIMAZE) injection  100 mcg Intravenous Once  . heparin  5,000 Units Subcutaneous Q8H  . insulin aspart  1 Units Subcutaneous Q4H  . insulin glargine  10 Units Subcutaneous Q24H  . mouth rinse  15 mL Mouth Rinse 10 times per day  . midazolam  2 mg Intravenous Once  . sodium chloride flush  10-40 mL Intracatheter Q12H    Assessment/Plan: Patient unchanged.  Intubated and sedated.  On Keppra at 1045m q 12hours.  Continued eye movements and lid fluttering.   particularly with stimulation.  Prognosis remains poor.    Recommendations: 1.  EEG today 2.  Head CT without contrast 3.  Increase Keppra to 15088mq 12 hours    LOS: 3 days   LeAlexis GoodellMD Neurology [phone]/28/2018  11:09 AM

## 2017-04-27 NOTE — Progress Notes (Signed)
*  PRELIMINARY RESULTS* Echocardiogram 2D Echocardiogram has been performed.  Sherrie Sport 04/27/2017, 9:28 AM

## 2017-04-27 NOTE — Progress Notes (Signed)
8592,9244, &1600 Unable to check pupils at any assessment  Bilateral eyes balls are tilted upward so that only  sclera are visible. Eye lids flutter with stimulation but no purposeful withdrawing to pain or spontaneous movement noted.All extremities are +3 edema. All pulses are doppler. Left dorsalis pedis pulse is absent even with doppler. Tube feeding increased per dietician to 55 this am but tube feed noted in subglottic suction tubing and rolled from patients mouth when turned on side to clean her. Tube feeding stopped x 1 hour and restarted at 20 ml/hour. BM x 2 after given colace and senna today. No family visited or called today. Plan- re scan head per CT 6/29 per Dr. Tyron Russell and Dr. Mortimer Fries.

## 2017-04-28 ENCOUNTER — Inpatient Hospital Stay: Payer: Medicare Other

## 2017-04-28 DIAGNOSIS — Z515 Encounter for palliative care: Secondary | ICD-10-CM

## 2017-04-28 DIAGNOSIS — Z66 Do not resuscitate: Secondary | ICD-10-CM

## 2017-04-28 LAB — BCR-ABL1, CML/ALL, PCR, QUANT

## 2017-04-28 LAB — BASIC METABOLIC PANEL
ANION GAP: 3 — AB (ref 5–15)
BUN: 6 mg/dL (ref 6–20)
CALCIUM: 5.4 mg/dL — AB (ref 8.9–10.3)
CO2: 14 mmol/L — ABNORMAL LOW (ref 22–32)
Chloride: 120 mmol/L — ABNORMAL HIGH (ref 101–111)
Creatinine, Ser: 0.86 mg/dL (ref 0.44–1.00)
Glucose, Bld: 91 mg/dL (ref 65–99)
Potassium: 3.3 mmol/L — ABNORMAL LOW (ref 3.5–5.1)
Sodium: 137 mmol/L (ref 135–145)

## 2017-04-28 LAB — CALCIUM: CALCIUM: 6.8 mg/dL — AB (ref 8.9–10.3)

## 2017-04-28 LAB — VALPROIC ACID LEVEL: VALPROIC ACID LVL: 15 ug/mL — AB (ref 50.0–100.0)

## 2017-04-28 LAB — PHOSPHORUS: Phosphorus: 2.9 mg/dL (ref 2.5–4.6)

## 2017-04-28 LAB — AMMONIA: Ammonia: 99 umol/L — ABNORMAL HIGH (ref 9–35)

## 2017-04-28 LAB — ALBUMIN: Albumin: <1 g/dL — ABNORMAL LOW (ref 3.5–5.0)

## 2017-04-28 LAB — MAGNESIUM
MAGNESIUM: 2.2 mg/dL (ref 1.7–2.4)
Magnesium: 1.5 mg/dL — ABNORMAL LOW (ref 1.7–2.4)

## 2017-04-28 LAB — POTASSIUM: Potassium: 4.1 mmol/L (ref 3.5–5.1)

## 2017-04-28 LAB — GLUCOSE, CAPILLARY
GLUCOSE-CAPILLARY: 87 mg/dL (ref 65–99)
GLUCOSE-CAPILLARY: 98 mg/dL (ref 65–99)
Glucose-Capillary: 116 mg/dL — ABNORMAL HIGH (ref 65–99)
Glucose-Capillary: 75 mg/dL (ref 65–99)

## 2017-04-28 MED ORDER — INSULIN GLARGINE 100 UNIT/ML ~~LOC~~ SOLN
5.0000 [IU] | SUBCUTANEOUS | Status: DC
  Filled 2017-04-28: qty 0.05

## 2017-04-28 MED ORDER — GLYCOPYRROLATE 0.2 MG/ML IJ SOLN
0.2000 mg | INTRAMUSCULAR | Status: DC | PRN
Start: 2017-04-28 — End: 2017-04-30
  Administered 2017-04-28 – 2017-04-30 (×4): 0.2 mg via INTRAVENOUS
  Filled 2017-04-28 (×5): qty 1

## 2017-04-28 MED ORDER — MORPHINE SULFATE (PF) 2 MG/ML IV SOLN
1.0000 mg | INTRAVENOUS | Status: DC | PRN
  Administered 2017-04-28 – 2017-04-30 (×5): 1 mg via INTRAVENOUS
  Filled 2017-04-28 (×5): qty 1

## 2017-04-28 MED ORDER — MAGNESIUM SULFATE 4 GM/100ML IV SOLN
4.0000 g | Freq: Once | INTRAVENOUS | Status: AC
  Administered 2017-04-28: 4 g via INTRAVENOUS
  Filled 2017-04-28: qty 100

## 2017-04-28 MED ORDER — POTASSIUM CHLORIDE 20 MEQ PO PACK
40.0000 meq | PACK | Freq: Once | ORAL | Status: AC
  Administered 2017-04-28: 40 meq
  Filled 2017-04-28: qty 2

## 2017-04-28 MED ORDER — SODIUM CHLORIDE 0.9 % IV SOLN
2.0000 g | Freq: Once | INTRAVENOUS | Status: AC
  Administered 2017-04-28: 2 g via INTRAVENOUS
  Filled 2017-04-28: qty 20

## 2017-04-28 MED ORDER — DEXTROSE 50 % IV SOLN
1.0000 | Freq: Once | INTRAVENOUS | Status: AC
  Administered 2017-04-28 (×2): 50 mL via INTRAVENOUS

## 2017-04-28 MED ORDER — LORAZEPAM 2 MG/ML IJ SOLN
1.0000 mg | INTRAMUSCULAR | Status: DC | PRN
Start: 2017-04-28 — End: 2017-04-30

## 2017-04-28 MED ORDER — DEXTROSE 50 % IV SOLN
INTRAVENOUS | Status: AC
  Administered 2017-04-28: 50 mL via INTRAVENOUS
  Filled 2017-04-28: qty 50

## 2017-04-28 MED ORDER — DEXTROSE 50 % IV SOLN: 1.0000 | INTRAVENOUS | Status: DC | PRN

## 2017-04-28 NOTE — Progress Notes (Signed)
[place] Progress Note Patient Name: [name] DOB: [birth year] MRN: [id number]   Date of Service  04/28/2017  HPI/Events of Note  Hypokalemia and hypomag  eICU Interventions  Potassium and mag replaced     Intervention Category Intermediate Interventions: Electrolyte abnormality - evaluation and management  DETERDING,ELIZABETH 04/28/2017, 5:09 AM

## 2017-04-28 NOTE — Progress Notes (Signed)
[place] [facility] [place] NAME: [name]    MR#:  [id number]  DATE OF BIRTH:  [birth year]  SUBJECTIVE:   Patient intubated sedated Still does not respond to pain nor does she have a gag reflex Nurse reports bilateral lower extremity pulses nonpalpable  REVIEW OF SYSTEMS:    Unable to obtain patient intubated and sedated  Tolerating Diet: Tube feedings      DRUG ALLERGIES:   Allergies  Allergen Reactions  . Peanut Oil Other (See Comments)    Face turns red  . Risperidone And Related Cough    VITALS:  Blood pressure (!) 84/56, pulse 75, temperature 97.5 F (36.4 C), temperature source Axillary, resp. rate (!) 28, height 5\' 8"  (1.727 m), weight 116.6 kg (257 lb 0.9 oz), SpO2 99 %.  PHYSICAL EXAMINATION:  Constitutional: Appears well-developed  Diffuse swelling Intubated and sedated  HENT: Normocephalic. . Intubated   Eyes: Conjunctivae and EOM are normal. PERRLA, no scleral icterus.  Neck:  No JVD. No tracheal deviation. CVS: RRR, S1/S2 +, no murmurs, no gallops, no carotid bruit.  Pulmonary: Decreased breath sounds throughout lung fields. No wheezing or rhonchi   Abdominal: Soft. BS +,  no distension, tenderness, rebound or guarding.  Musculoskeletal:  2+ bilateral lower extremity edema  Neuro: Sedated on vent without gag reflex. Unresponsive to pain  Skin: venous stasis changes  Psychiatric: Sedated   LABORATORY PANEL:   CBC  Recent Labs Lab 04/27/17 0433  WBC 18.4*  HGB 11.7*  HCT 35.5  PLT 302   ------------------------------------------------------------------------------------------------------------------  Chemistries   Recent Labs Lab 04/25/17 0713  04/28/17 0403  NA 134*  < > 137  K 3.0*  < > 3.3*  CL 103  < > 120*  CO2 22  < > 14*  GLUCOSE 126*  < > 91  BUN 5*  < > 6  CREATININE 1.14*  < > 0.86  CALCIUM 6.7*  < > 5.4*  MG 1.9  < > 1.5*  AST 178*  --   --   ALT 62*  --   --   ALKPHOS 211*   --   --   BILITOT 0.9  --   --   < > = values in this interval not displayed. ------------------------------------------------------------------------------------------------------------------  Cardiac Enzymes  Recent Labs Lab 04/25/17 0111 04/25/17 0713 04/25/17 1313  TROPONINI 0.13* 0.17* 0.14*   ------------------------------------------------------------------------------------------------------------------  RADIOLOGY:  No results found.   ASSESSMENT AND PLAN:   46 year old female with a history of TBI and diabetes who presents with cardiopulmonary arrest.   1. Cardiopulmonary arrest (V. fib/PA arrest): Unclear etiology possible hypokalemia contributing And new diagnoses ischemic cardiomyopathy EF 20-25% Continue hypothermia protocol Appreciate cardiology consult  2. Acute hypoxic respiratory failure requiring mechanical intubation with acute encephalopathy: Assessment of patient's neurological function shows signs of severe anoxic brain injury Appreciate neurology consultation Continue Keppra Ventilator management as per intensivist  3. Cardiogenic shock with new diagnosis of ischemic cardiomyopathy EF 20-25%: Continue pressors to keep map greater than [place] cardiology consultation. Continue supportive care Echocardiogram shows wall motion abnormality suggestive of ischemic or stress-induced cardiopathy Not able to take heart failure medications at present due to hypotension  4. Diabetes: Continue Lantus  5. Nutrition: Patient is on vital high protein  6. Elevated troponin due to demand ischemia    7. Hypocalcemia with acidosis: Needs replacement 8. Hypokalemia: This is being repleted  Patient has very poor overall prognosis.   Management plans discussed  with nurising  CODE STATUS:  DNR  TOTAL TIME TAKING CARE OF THIS PATIENT: 24 minutes.     POSSIBLE D/C ??, DEPENDING ON CLINICAL CONDITION.   Maryetta Shafer M.D on 04/28/2017 at 7:31  AM  Between 7am to 6pm - Pager - [phone] After 6pm go to [email or web address] - password [email or web address] [facility]  Office  [phone]  CC: Primary care physician; Remi Haggard, FNP  Note: This dictation was prepared with Dragon dictation along with smaller phrase technology. Any transcriptional errors that result from this process are unintentional.

## 2017-04-28 NOTE — Progress Notes (Signed)
After having an extensive discussion with pts Healthcare Power of [place] regarding prognosis and hospital course she has decided she would like the pt to be transitioned to comfort care only.  Therefore, orders placed to transition pt to comfort care measures only. Mrs. [name] has provided legal healthcare POA paperwork that has been placed on the chart.    Marda Stalker, [place] Pager [phone] (please enter 7 digits) PCCM Consult Pager [phone] (please enter 7 digits)

## 2017-04-28 NOTE — Progress Notes (Signed)
Continues to be unresponsive.  Slight motion in lower extremities when pressure applied to nailbeds.  Eyes remain the same as reported by previous nurse.  Appears to be tolerating trickle tube feeding as no feed noted in subglottic or oral suction tubing.  Rectal tube in place and draining brown liquid with no leakage noted. Bilateral pedal pulses absent.

## 2017-04-28 NOTE — Progress Notes (Signed)
Patient made comfort care per orders, RT extubated patient. Family brought to bedside, comfort provided to them. PRN morphine administered to provide comfort. Will continue to monitor patient.

## 2017-04-28 NOTE — Progress Notes (Signed)
PULMONARY / CRITICAL CARE MEDICINE   Name: [name] MRN: [id number] DOB: [birth year]    ADMISSION DATE:  04/22/2017 CONSULTATION DATE: 04/19/2017  REFERRING MD: Dr. Posey Pronto   CHIEF COMPLAINT: Cardiac Arrest   Synopsis This is a 46 yo female with a PMH of Hypothyroidism, Traumatic Brain Injury, Schizophrenia, Regional Enteritis, Recurrent Bilateral Lower Extremity Cellulitis, Obesity, Chronic Hypotension, HTN, Hiatal Hernia, Hyperlipidemia, GERD, Hepatic Encephalopathy, Diabetes Mellitus, Hyperammonemia, Depression, Crohn's Disease, and Acute Renal Failure.  She presented to [facility] ER 06/25 post cardiac arrest from a group home.  Per ER notes the pts roommate found the pt unresponsive for an unknown duration of time, therefore EMS notified.  Per EMS staff fire department arrived prior to their arrival and defibrillator suggested cardiac rhythm should be shocked, therefore one shock administered.  Once EMS arrived the pt went into a Vfibb arrest and CPR was in progress by fire department.  She was shocked a 2nd time and then went into PEA arrest, therefore she received 3 rounds of epinephrine with ROSC, estimated total downtime 25 minutes. EMS placed a [place] airway in the field.  In the ER the pt was mechanically intubated.  Lab results revealed Na+ 131, K+ 2.9, lactic acid 10.3, wbc 27.7, and hgb 9.3. Pt admitted by hospitalist team to ICU for further workup and treatment. PCCM consulted for hypothermic protocol and vent management.    History of present illness Patient seen and examined at bedside. Noted events overnight. Neurology input appreciated.    REVIEW OF SYSTEMS:   Unable to assess pt intubated    VITAL SIGNS: BP (!) 84/56 (BP Location: Other (Comment)) Comment (BP Location): a line right groin  Pulse 74   Temp 98.6 F (37 C) (Core (Comment)) Comment (Src): foley probe  Resp 14   Ht 5' 8" (1.727 m)   Wt 257 lb 0.9 oz (116.6 kg)   SpO2 98%   BMI 39.09 kg/m   VENTILATOR  SETTINGS: Vent Mode: PRVC FiO2 (%):  [35 %-50 %] 35 % Set Rate:  [14 bmp] 14 bmp Vt Set:  [500 mL] 500 mL PEEP:  [5 cmH20] 5 cmH20 Plateau Pressure:  [14 cmH20] 14 cmH20  INTAKE / OUTPUT: I/O last 3 completed shifts: In: 4921.7 [I.V.:4331.7; IV Piggyback:590] Out: 2010 [Urine:1560; Stool:450]  PHYSICAL EXAMINATION: General: acutely ill appearing Caucasian female, mechanically intubated  Neuro: not following commands, No Doll's eyes, No corneal reflex  HEENT: supple, no JVD Cardiovascular: s1s2, rrr, no M/R/G Lungs: course throughout, symmetrical expansion, non labored Abdomen: +BS x4, soft, obese, non tender, non distended  Musculoskeletal: normal tone, 2+ bilateral generalized edema Skin: chronic vascular skin changes bilateral lower extremities  LABS:  BMET  Recent Labs Lab 04/26/17 2202 04/27/17 0433 04/28/17 0403  NA 135 134* 137  K 4.5 4.4 3.3*  CL 113* 113* 120*  CO2 17* 18* 14*  BUN _0 CREATININE 1.03* 0.93 0.86  GLUCOSE 118* 119* 91    Electrolytes  Recent Labs Lab 04/25/17 1126  04/26/17 1301 04/26/17 2202 04/27/17 0433 04/28/17 0403  CALCIUM 6.8*  < > 6.0* 6.3* 6.3* 5.4*  MG  --   < > 1.6*  --  2.0 1.5*  PHOS 3.1  --   --   --   --  2.9  < > = values in this interval not displayed.  CBC  Recent Labs Lab 04/10/2017 1510 04/10/2017 1717 04/27/17 0433  WBC 17.2* 27.7* 18.4*  HGB 10.4* 9.3* 11.7*  HCT 30.7*  27.9* 35.5  PLT 442* 356 302    Coag's  Recent Labs Lab 04/12/2017 1717 04/25/2017 2102 04/25/17 0418 04/25/17 1126  APTT  --  49* 39* 46*  INR 1.38  --   --  1.18    Sepsis Markers  Recent Labs Lab 04/12/2017 1717 04/29/2017 2102 04/25/17 0713 04/26/17 0515  LATICACIDVEN 10.3* 5.5*  --   --   PROCALCITON  --  0.72 1.50 2.42    ABG  Recent Labs Lab 04/18/2017 1850 04/25/17 1030  PHART 7.34* 7.39  PCO2ART 37 31*  PO2ART 115* 121*    Liver Enzymes  Recent Labs Lab 04/18/2017 1717 04/25/17 0713 04/26/17 0905  AST  204* 178*  --   ALT 56* 62*  --   ALKPHOS 179* 211*  --   BILITOT 0.8 0.9  --   ALBUMIN 1.2* 1.3* 1.0*    Cardiac Enzymes  Recent Labs Lab 04/25/17 0111 04/25/17 0713 04/25/17 1313  TROPONINI 0.13* 0.17* 0.14*    Glucose  Recent Labs Lab 04/27/17 1951 04/27/17 2348 04/27/17 2350 04/28/17 0147 04/28/17 0416 04/28/17 0745  GLUCAP 85 69 65 116* 87 98    Imaging No results found. STUDIES:  CT Head 06/25>>No acute abnormality. 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  CULTURES: Blood 06/25>> mrsa Urine 06/25>> no growth Respiratory 06/26>> no growth  ANTIBIOTICS: Vancomycin 06/25 x1 dose Zosyn 06/25 x1 dose  Zosyn 06/26>>  SIGNIFICANT EVENTS: 06/25-Pt admitted to ICU post cardiac arrest mechanically intubated.  06/25- Targeted Temperature Management (TTM) initiated, Goal Temperature of 33C met @ 2225 06/26- TTM continues, to begin rewarming phase at 2225 06/27-TTM continues on rewarming phase 06/28-discussion with healthcare power of attorney patient is now a DNR  LINES/TUBES: Right IJ 06/25>> ETT 06/25>> Right Femoral A-line 06/25>>  ASSESSMENT / PLAN: 46 y.o. Female who presents with V-fib/PEA cardiac arrest, probably secondary to ischemic cardiomyopathy, now with Shock requiring vasopressors and multi-organ failure.  PULMONARY A: Acute cardiorespiratory arrest (Vfib/PEA arrest)  Mechanical Intubation  Hx: Obesity  P:   Continue full vent support  Hypothermia protocol _0  C completed VAP bundle    CARDIOVASCULAR A:  Acute cardiorespiratory arrest (Vfib and PEA arrest) likely secondary to ischemic cardiomyopathy Hx: HTN, Chronic hypotension, and Hyperlipidemia P:  On vasopressors to maintain MAP>70.  Continuous telemetry monitoring  RENAL A:   Hypokalemia, hypocalcemia, hypomagnesemia Severe Lactic Acidosis-Improving  Hx: Acute renal failure  P:   Trend CMP Replace electrolytes as indicated Monitor  UOP   GASTROINTESTINAL A:   Elevated liver enzymes-Slightly improving Hx: Regional enteritis, GERD, Chr and Hiatal Hernia  P:   Pepcid for PUD prophylaxis   HEMATOLOGIC A:   Anemia without acute blood loss  P:  Trend CBC Subq heparin for VTE prophylaxis  Transfuse for hgb <7   INFECTIOUS A:   Leukocytosis  P:   Monitor white count  ENDOCRINE A:   Diabetes Mellitus Hx: Hypothyroidism  P:   Glycemic control. On tube feeds.   NEUROLOGIC Findings to suggest severe anoxic brain injury Follow up neurology recommendations - repeat CT head today.   DNR   Critical Care Time devoted to patient care services described in this note is 32 minutes.   Overall, patient is critically ill, prognosis is guarded.  Patient with Multiorgan failure and at high risk for cardiac arrest and death.     Dimas Chyle MD PCCM

## 2017-04-28 NOTE — Progress Notes (Addendum)
Inpatient Diabetes Program Recommendations  AACE/ADA: New Consensus Statement on Inpatient Glycemic Control (2015)  Target Ranges:  Prepandial:   less than 140 mg/dL      Peak postprandial:   less than 180 mg/dL (1-2 hours)      Critically ill patients:  140 - 180 mg/dL   Lab Results  Component Value Date   GLUCAP 98 04/28/2017   HGBA1C 6.5 (H) 03/15/2012    Review of Glycemic Control  Results for MELIYA, MCCONAHY (MRN [id number]) as of 04/28/2017 09:58  Ref. Range 04/27/2017 23:50 04/28/2017 01:47 04/28/2017 04:16 04/28/2017 07:45  Glucose-Capillary Latest Ref Range: 65 - 99 mg/dL 65 116 (H) 87 98    Diabetes history: none noted (A1C 6.5% in 2013) Outpatient Diabetes medications: none Current orders for Inpatient glycemic control: Lantus 10 units qhs, Novolog 1 units q4h  Inpatient Diabetes Program Recommendations:  Consider decreasing Lantus to 9 units qhs.   Continue Novolog 1 unit q4h per parameters (hold if tube feeds are held or stopped)   Per ADA recommendations "consider performing an A1C on all patients with diabetes or hyperglycemia admitted to the hospital if not performed in the prior 3 months".   Gentry Fitz, RN, BA, MHA, CDE Diabetes Coordinator Inpatient Diabetes Program  [phone] (Team Pager) [phone] ([place]) 04/28/2017 10:00 AM

## 2017-04-28 NOTE — Clinical Social Work Note (Signed)
CSW has noted that patient has now been made comfort care. Shela Leff MSW,LCSW [phone]

## 2017-04-28 NOTE — Progress Notes (Signed)
Nutrition Follow-up  DOCUMENTATION CODES:   Not applicable  INTERVENTION:  1. Patient now on trickle feeds, vomited/tube feed found in subglottic/oral suction tubing overnight  NUTRITION DIAGNOSIS:   Inadequate oral intake related to inability to eat as evidenced by NPO status. -ongoing  GOAL:   Provide needs based on ASPEN/SCCM guidelines -not meeting currently  MONITOR:   Labs, Weight trends, Vent status, I & O's, TF tolerance  REASON FOR ASSESSMENT:   Ventilator    ASSESSMENT:   Patient found unresponsive at the group home. She underwent CPR with shock in the field. She is currently intubated on the ventilator. No family. Unable to provide any history or review of systems since patient is intubated  Patient is currently intubated on ventilator support MV: 7.6 L/min Temp (24hrs), Avg:98.1 F (36.7 C), Min:97.5 F (36.4 C), Max:98.6 F (37 C) Propofol: none DNR now Poor prognosis, head CT today Labs and medications reviewed: K 3.3, Mg 1.5 Senokot, Pepcid NS @ 166mL/hr Fentanyl gtt, Levo gtt, Vaso gtt  Diet Order:  Diet NPO time specified  Skin:  Wound (see comment) (Abrasion to arm and leg)  Last BM:  04/25/2017  Height:   Ht Readings from Last 1 Encounters:  04/27/17 5\' 8"  (1.727 m)    Weight:   Wt Readings from Last 1 Encounters:  04/27/17 257 lb 0.9 oz (116.6 kg)    Ideal Body Weight:  55.7 kg  BMI:  Body mass index is 39.09 kg/m.  Estimated Nutritional Needs:   Kcal:  1140-1450 calories  Protein:  >/= 111 grams  Fluid:  Per MD/NP/PA  EDUCATION NEEDS:   Education needs no appropriate at this time  Satira Anis. Chidiebere Wynn, MS, RD LDN Inpatient Clinical Dietitian Pager [phone]

## 2017-04-28 NOTE — Progress Notes (Signed)
Subjective: Patient unchanged.  Remains of fentanyl of 25 with very poor exam.    Objective: Current vital signs: BP (!) 84/56 (BP Location: Other (Comment)) Comment (BP Location): a line right groin  Pulse 74   Temp 98.6 F (37 C) (Core (Comment)) Comment (Src): foley probe  Resp 14   Ht 5' 8"  (1.727 m)   Wt 116.6 kg (257 lb 0.9 oz)   SpO2 98%   BMI 39.09 kg/m  Vital signs in last 24 hours: Temp:  [97.5 F (36.4 C)-99 F (37.2 C)] 98.6 F (37 C) (06/29 0800) Pulse Rate:  [74-83] 74 (06/29 1000) Resp:  [13-28] 14 (06/29 1100) SpO2:  [88 %-100 %] 98 % (06/29 1100) Arterial Line BP: (86-103)/(47-70) 96/56 (06/29 1100) FiO2 (%):  [35 %-50 %] 35 % (06/29 1100)  Intake/Output from previous day: 06/28 0701 - 06/29 0700 In: 3610.1 [I.V.:3070.1; IV Piggyback:540] Out: 6333 [Urine:1160; Stool:450] Intake/Output this shift: Total I/O In: 1851.4 [I.V.:1409.2; NG/GT:392.2; IV Piggyback:50] Out: 250 [Urine:250] Nutritional status: Diet NPO time specified  Neurologic Exam: Mental Status: Patient does not respond to verbal stimuli.  Does not respond to deep sternal rub.  Does not follow commands.  No verbalizations noted.  Cranial Nerves: II: patient does not respond confrontation bilaterally, pupils right 3 mm, left 3 mm,and unreactive bilaterally III,IV,VI: doll's response present bilaterally. On initial evaluation pupils with upward deviation.  With stimulation pupils deviated to the left and eyelid flutter.   V,VII: corneal reflex absent bilaterally  VIII: patient does not respond to verbal stimuli IX,X: gag reflex reduced, XI: trapezius strength unable to test bilaterally XII: tongue strength unable to test Motor: Extremities flaccid throughout.  No spontaneous movement noted.  No purposeful movements noted. Sensory: Does not respond to noxious stimuli in any extremity. Deep Tendon Reflexes:  1+ in the upper extremities and absent in the lower extremities Plantars: mute  bilaterally Cerebellar: Unable to perform  Lab Results: Basic Metabolic Panel:  Recent Labs Lab 04/25/17 0713 04/25/17 1126  04/26/17 0905 04/26/17 1301 04/26/17 2202 04/27/17 0433 04/28/17 0403  NA 134* 134*  < > 134* 129* 135 134* 137  K 3.0* 3.8  < > 4.2 4.4 4.5 4.4 3.3*  CL 103 106  < > 113* 109 113* 113* 120*  CO2 22 21*  < > 17* 17* 17* 18* 14*  GLUCOSE 126* 159*  < > 112* 110* 118* 119* 91  BUN 5* <5*  < > 5* 5* 6 6 6   CREATININE 1.14* 0.87  < > 0.99 0.94 1.03* 0.93 0.86  CALCIUM 6.7* 6.8*  < > 6.2* 6.0* 6.3* 6.3* 5.4*  MG 1.9  --   --  1.7 1.6*  --  2.0 1.5*  PHOS  --  3.1  --   --   --   --   --  2.9  < > = values in this interval not displayed.  Liver Function Tests:  Recent Labs Lab 04/17/2017 1717 04/25/17 0713 04/26/17 0905  AST 204* 178*  --   ALT 56* 62*  --   ALKPHOS 179* 211*  --   BILITOT 0.8 0.9  --   PROT 3.7* 4.3*  --   ALBUMIN 1.2* 1.3* 1.0*   No results for input(s): LIPASE, AMYLASE in the last 168 hours.  Recent Labs Lab 04/15/2017 2102  AMMONIA 39*    CBC:  Recent Labs Lab 04/01/2017 1510 04/25/2017 1717 04/27/17 0433  WBC 17.2* 27.7* 18.4*  NEUTROABS 3.5 11.4*  --  HGB 10.4* 9.3* 11.7*  HCT 30.7* 27.9* 35.5  MCV 89.9 94.0 95.8  PLT 442* 356 302    Cardiac Enzymes:  Recent Labs Lab 04/14/2017 2102 04/25/17 0111 04/25/17 0713 04/25/17 1313  TROPONINI 0.09* 0.13* 0.17* 0.14*    Lipid Panel: No results for input(s): CHOL, TRIG, HDL, CHOLHDL, VLDL, LDLCALC in the last 168 hours.  CBG:  Recent Labs Lab 04/27/17 2348 04/27/17 2350 04/28/17 0147 04/28/17 0416 04/28/17 0745  GLUCAP 69 65 116* 87 9    Microbiology: Results for orders placed or performed during the hospital encounter of 04/02/2017  Culture, blood (Routine x 2)     Status: None (Preliminary result)   Collection Time: 04/09/2017  5:45 PM  Result Value Ref Range Status   Specimen Description BLOOD CENTRAL LINE  Final   Special Requests   Final     BOTTLES DRAWN AEROBIC AND ANAEROBIC Blood Culture adequate volume   Culture NO GROWTH 4 DAYS  Final   Report Status PENDING  Incomplete  Culture, blood (Routine x 2)     Status: Abnormal   Collection Time: 04/02/2017  5:50 PM  Result Value Ref Range Status   Specimen Description BLOOD BLOOD RIGHT FOREARM  Final   Special Requests   Final    BOTTLES DRAWN AEROBIC AND ANAEROBIC Blood Culture adequate volume   Culture  Setup Time   Final    GRAM POSITIVE COCCI IN BOTH AEROBIC AND ANAEROBIC BOTTLES CRITICAL RESULT CALLED TO, READ BACK BY AND VERIFIED WITH: MELISSA [place] 04/25/17 @ 73  [place]    Culture (A)  Final    STAPHYLOCOCCUS SPECIES (COAGULASE NEGATIVE) THE SIGNIFICANCE OF ISOLATING THIS ORGANISM FROM A SINGLE SET OF BLOOD CULTURES WHEN MULTIPLE SETS ARE DRAWN IS UNCERTAIN. PLEASE NOTIFY THE MICROBIOLOGY DEPARTMENT WITHIN ONE WEEK IF SPECIATION AND SENSITIVITIES ARE REQUIRED. Performed at [facility], [place] [address] [place], [place] [address]    Report Status 04/27/2017 FINAL  Final  Blood Culture ID Panel (Reflexed)     Status: Abnormal   Collection Time: 04/26/2017  5:50 PM  Result Value Ref Range Status   Enterococcus species NOT DETECTED NOT DETECTED Final   Listeria monocytogenes NOT DETECTED NOT DETECTED Final   Staphylococcus species DETECTED (A) NOT DETECTED Final    Comment: Methicillin (oxacillin) resistant coagulase negative staphylococcus. Possible blood culture contaminant (unless isolated from more than one blood culture draw or clinical case suggests pathogenicity). No antibiotic treatment is indicated for blood  culture contaminants. CRITICAL RESULT CALLED TO, READ BACK BY AND VERIFIED WITH: MELISSA [place] 04/25/17 @ 4503  [place]    Staphylococcus aureus NOT DETECTED NOT DETECTED Final   Methicillin resistance DETECTED (A) NOT DETECTED Final    Comment: CRITICAL RESULT CALLED TO, READ BACK BY AND VERIFIED WITH: MELISSA [place] 04/25/17 @ 8882  [place]    Streptococcus  species NOT DETECTED NOT DETECTED Final   Streptococcus agalactiae NOT DETECTED NOT DETECTED Final   Streptococcus pneumoniae NOT DETECTED NOT DETECTED Final   Streptococcus pyogenes NOT DETECTED NOT DETECTED Final   Acinetobacter baumannii NOT DETECTED NOT DETECTED Final   Enterobacteriaceae species NOT DETECTED NOT DETECTED Final   Enterobacter cloacae complex NOT DETECTED NOT DETECTED Final   Escherichia coli NOT DETECTED NOT DETECTED Final   Klebsiella oxytoca NOT DETECTED NOT DETECTED Final   Klebsiella pneumoniae NOT DETECTED NOT DETECTED Final   Proteus species NOT DETECTED NOT DETECTED Final   Serratia marcescens NOT DETECTED NOT DETECTED Final   Haemophilus influenzae  NOT DETECTED NOT DETECTED Final   Neisseria meningitidis NOT DETECTED NOT DETECTED Final   Pseudomonas aeruginosa NOT DETECTED NOT DETECTED Final   Candida albicans NOT DETECTED NOT DETECTED Final   Candida glabrata NOT DETECTED NOT DETECTED Final   Candida krusei NOT DETECTED NOT DETECTED Final   Candida parapsilosis NOT DETECTED NOT DETECTED Final   Candida tropicalis NOT DETECTED NOT DETECTED Final  MRSA PCR Screening     Status: None   Collection Time: 04/07/2017  7:50 PM  Result Value Ref Range Status   MRSA by PCR NEGATIVE NEGATIVE Final    Comment:        The GeneXpert MRSA Assay (FDA approved for NASAL specimens only), is one component of a comprehensive MRSA colonization surveillance program. It is not intended to diagnose MRSA infection nor to guide or monitor treatment for MRSA infections.   Culture, Urine     Status: None   Collection Time: 04/19/2017  9:02 PM  Result Value Ref Range Status   Specimen Description URINE, RANDOM  Final   Special Requests NONE  Final   Culture   Final    NO GROWTH Performed at [facility], [address], [place], [place] [address]    Report Status 04/26/2017 FINAL  Final  Culture, respiratory (NON-Expectorated)     Status: None   Collection Time:  04/25/17  6:09 AM  Result Value Ref Range Status   Specimen Description TRACHEAL ASPIRATE  Final   Special Requests NONE  Final   Gram Stain   Final    RARE WBC PRESENT, PREDOMINANTLY MONONUCLEAR NO ORGANISMS SEEN    Culture   Final    Consistent with normal respiratory flora. Performed at [facility], [place] [address] [place],  [address]    Report Status 04/27/2017 FINAL  Final    Coagulation Studies: No results for input(s): LABPROT, INR in the last 72 hours.  Imaging: No results found.  Medications:  I have reviewed the patient's current medications. Scheduled: . chlorhexidine gluconate (MEDLINE KIT)  15 mL Mouth Rinse BID  . sennosides  5 mL Per Tube BID   And  . docusate  100 mg Per Tube BID  . famotidine  20 mg Per Tube BID  . fentaNYL (SUBLIMAZE) injection  100 mcg Intravenous Once  . heparin  5,000 Units Subcutaneous Q8H  . insulin glargine  5 Units Subcutaneous Q24H  . mouth rinse  15 mL Mouth Rinse 10 times per day  . midazolam  2 mg Intravenous Once  . sodium chloride flush  10-40 mL Intracatheter Q12H    Assessment/Plan:  Patient opens eyes to painful stimuli at times but no other movements. No withdrawal from painful stimuli.   Intubated and sedated.  On Keppra at 1089m q 12hours.  Continued eye movements and lid fluttering.   particularly with stimulation.  Prognosis remains poor.    EEG done with burst suppression, no seizures.  This is a very poor prognosis on its own.   Pt is going for repeat CTH today 04/28/2017  11:46 AM

## 2017-04-28 NOTE — Progress Notes (Signed)
Pharmacy Antibiotic Note/ Electrolyte Monitoring/ Constipation Prevention  [name] is a 46 y.o. female admitted on 04/20/2017 s/p cardiac arrest on empiric antibiotics with elevated PCT and leukocytosis.  Pharmacy has been consulted for vancomycin dosing as well as electrolyte monitoring and constipation prevention. Patient is ventilated, sedated, and on enteral feedings.   Plan: 1. Continue Zosyn 3.375g IV q8h (4 hour infusion). Will f/u plans for abx. Currently on day 4 of Zosyn with cultures unremarkable and normal CXR.   2. K, Ca, and mag replaced by CCM. Will f/u AM labs.   3. Continue bowel regimen with senna/docusate liquid VT bid.   Height: 5\' 8"  (172.7 cm) Weight: 257 lb 0.9 oz (116.6 kg) IBW/kg (Calculated) : 63.9  Temp (24hrs), Avg:98.4 F (36.9 C), Min:97.5 F (36.4 C), Max:99 F (37.2 C)   Recent Labs Lab 04/26/2017 1510  04/09/2017 1717 04/01/2017 2102  04/26/17 0905 04/26/17 1301 04/26/17 2202 04/27/17 0433 04/28/17 0403  WBC 17.2*  --  27.7*  --   --   --   --   --  18.4*  --   CREATININE  --   < > 0.98 1.07*  < > 0.99 0.94 1.03* 0.93 0.86  LATICACIDVEN  --   --  10.3* 5.5*  --   --   --   --   --   --   < > = values in this interval not displayed.  Estimated Creatinine Clearance: 110.8 mL/min (by C-G formula based on SCr of 0.86 mg/dL).    Allergies  Allergen Reactions  . Peanut Oil Other (See Comments)    Face turns red  . Risperidone And Related Cough   BMP Latest Ref Rng & Units 04/28/2017 04/27/2017 04/26/2017  Glucose 65 - 99 mg/dL 91 119(H) 118(H)  BUN 6 - 20 mg/dL 6 6 6   Creatinine 0.44 - 1.00 mg/dL 0.86 0.93 1.03(H)  Sodium 135 - 145 mmol/L 137 134(L) 135  Potassium 3.5 - 5.1 mmol/L 3.3(L) 4.4 4.5  Chloride 101 - 111 mmol/L 120(H) 113(H) 113(H)  CO2 22 - 32 mmol/L 14(L) 18(L) 17(L)  Calcium 8.9 - 10.3 mg/dL 5.4(LL) 6.3(LL) 6.3(LL)   Albumin (g/dL)  Date Value  04/26/2017 1.0 (L)  11/23/2012 3.0 (L)   Magnesium (mg/dL)  Date Value   04/28/2017 1.5 (L)  08/09/2012 1.3 (L)   Phosphorus (mg/dL)  Date Value  04/28/2017 2.9  08/09/2012 4.8    CCa=   Antimicrobials this admission: Vancomycin 6/25  X 1 Zosyn 6/25 >>   Dose adjustments this admission:   Microbiology results: 6/25 BCx: CNS 1/2 6/25 UCx: negative  6/26 TA: normal flora 6/25 MRSA PCR: negative  Thank you for allowing pharmacy to be a part of this patient's care.  Napoleon Form 04/28/2017 9:37 AM

## 2017-04-28 NOTE — Progress Notes (Signed)
Received pt to floor 106 from ccu. Pt nonresponsive.  Rolls eyes upward at times/ repos and made comfortable. No resp distress. Unable to  Get  Vs. Comfort care.

## 2017-04-29 LAB — CULTURE, BLOOD (ROUTINE X 2)
Culture: NO GROWTH
Special Requests: ADEQUATE

## 2017-04-29 NOTE — Progress Notes (Signed)
Patient's family decided to de-escalate care. Extubated and transferred out from ICU. Comfort care measures. Will sign off.  Dimas Chyle MD

## 2017-04-29 NOTE — Progress Notes (Addendum)
[place] at [place] NAME: [name]    MR#:  [id number]  DATE OF BIRTH:  22-Feb-1971  SUBJECTIVE:   Patient transition to comfort care REVIEW OF SYSTEMS:    Unable to obtain patient mental status      DRUG ALLERGIES:   Allergies  Allergen Reactions  . Peanut Oil Other (See Comments)    Face turns red  . Risperidone And Related Cough    VITALS:  Blood pressure (!) 60/31, pulse 70, temperature (!) 95.6 F (35.3 C), resp. rate 15, height 5\' 8"  (1.727 m), weight 116.6 kg (257 lb 0.9 oz), SpO2 (!) 87 %.  PHYSICAL EXAMINATION:  Laying in bed B/l rhonchii LABORATORY PANEL:   CBC  Recent Labs Lab 04/27/17 0433  WBC 18.4*  HGB 11.7*  HCT 35.5  PLT 302   ------------------------------------------------------------------------------------------------------------------  Chemistries   Recent Labs Lab 04/25/17 0713  04/28/17 0403 04/28/17 1231  NA 134*  < > 137  --   K 3.0*  < > 3.3* 4.1  CL 103  < > 120*  --   CO2 22  < > 14*  --   GLUCOSE 126*  < > 91  --   BUN 5*  < > 6  --   CREATININE 1.14*  < > 0.86  --   CALCIUM 6.7*  < > 5.4* 6.8*  MG 1.9  < > 1.5* 2.2  AST 178*  --   --   --   ALT 62*  --   --   --   ALKPHOS 211*  --   --   --   BILITOT 0.9  --   --   --   < > = values in this interval not displayed. ------------------------------------------------------------------------------------------------------------------  Cardiac Enzymes  Recent Labs Lab 04/25/17 0111 04/25/17 0713 04/25/17 1313  TROPONINI 0.13* 0.17* 0.14*   ------------------------------------------------------------------------------------------------------------------  RADIOLOGY:  Ct Head Wo Contrast  Result Date: 04/28/2017 CLINICAL DATA:  Acute encephalopathy. Status post cardiac arrest 4 days ago. EXAM: CT HEAD WITHOUT CONTRAST TECHNIQUE: Contiguous axial images were obtained from the base of the skull through the vertex  without intravenous contrast. COMPARISON:  04/27/2017. FINDINGS: Brain: No evidence of acute infarction, hemorrhage, hydrocephalus, extra-axial collection or mass lesion/mass effect. Vascular: No hyperdense vessel or unexpected calcification. Skull: Bilateral hyperostosis frontalis. Sinuses/Orbits: Right sphenoid sinus mucosal thickening/retention cysts. Unremarkable included orbits. Other: Previously noted bilateral trichilemmal scalp cysts. IMPRESSION: No acute abnormality. Electronically Signed   By: Claudie Revering M.D.   On: 04/28/2017 12:19     ASSESSMENT AND PLAN:   46 year old female with a history of TBI and diabetes who presents with cardiopulmonary arrest.   1. Cardiopulmonary arrest (V. fib/PA arrest): Unclear etiology possible hypokalemia contributing And new diagnoses ischemic cardiomyopathy EF 20-25%   2. Acute hypoxic respiratory failure requiring mechanical intubation with acute encephalopathy:   3. Cardiogenic shock with new diagnosis of ischemic cardiomyopathy EF 20-25%: 4. Diabetes:   5 Elevated troponin due to demand ischemia    6 Hypocalcemia with acidosis:  7. Hypokalemia:    Power of attorney has decided due to very poor prognosis patient be transitioned to comfort care   Management plans discussed with nurising  CODE STATUS:  DNR  TOTAL TIME TAKING CARE OF THIS PATIENT: 7 minutes.     POSSIBLE D/C ??, DEPENDING ON CLINICAL CONDITION.   Sufyaan Palma M.D on 04/29/2017 at 9:02 AM  Between 7am to 6pm - Pager - [phone]  After 6pm go to [email or web address] - password [email or web address] [facility]  Office  [phone]  CC: Primary care physician; Remi Haggard, FNP  Note: This dictation was prepared with Dragon dictation along with smaller phrase technology. Any transcriptional errors that result from this process are unintentional.

## 2017-04-30 MED ORDER — SCOPOLAMINE 1 MG/3DAYS TD PT72
1.0000 | MEDICATED_PATCH | TRANSDERMAL | Status: DC
  Administered 2017-04-30 – 2017-05-03 (×2): 1.5 mg via TRANSDERMAL
  Filled 2017-04-30 (×2): qty 1

## 2017-04-30 MED ORDER — MORPHINE SULFATE (CONCENTRATE) 10 MG/0.5ML PO SOLN
10.0000 mg | ORAL | Status: DC | PRN
  Administered 2017-05-01 (×2): 10 mg via ORAL
  Filled 2017-04-30 (×2): qty 1

## 2017-04-30 NOTE — Progress Notes (Signed)
[place] at [place] NAME: [name]    MR#:  [id number]  DATE OF BIRTH:  Feb 11, 1971  SUBJECTIVE:   Patient Sounds readily this morning. She is on comfort care REVIEW OF SYSTEMS:    Unable to obtain patient Due to current mental status      DRUG ALLERGIES:   Allergies  Allergen Reactions  . Peanut Oil Other (See Comments)    Face turns red  . Risperidone And Related Cough    VITALS:  Blood pressure (!) 66/31, pulse 78, temperature (!) 95.6 F (35.3 C), resp. rate 20, height 5\' 8"  (1.727 m), weight 116.6 kg (257 lb 0.9 oz), SpO2 (!) 66 %.  PHYSICAL EXAMINATION:  Laying in bed Does not appear to be in distress Bilateral rhonchi  LABORATORY PANEL:   CBC  Recent Labs Lab 04/27/17 0433  WBC 18.4*  HGB 11.7*  HCT 35.5  PLT 302   ------------------------------------------------------------------------------------------------------------------  Chemistries   Recent Labs Lab 04/25/17 0713  04/28/17 0403 04/28/17 1231  NA 134*  < > 137  --   K 3.0*  < > 3.3* 4.1  CL 103  < > 120*  --   CO2 22  < > 14*  --   GLUCOSE 126*  < > 91  --   BUN 5*  < > 6  --   CREATININE 1.14*  < > 0.86  --   CALCIUM 6.7*  < > 5.4* 6.8*  MG 1.9  < > 1.5* 2.2  AST 178*  --   --   --   ALT 62*  --   --   --   ALKPHOS 211*  --   --   --   BILITOT 0.9  --   --   --   < > = values in this interval not displayed. ------------------------------------------------------------------------------------------------------------------  Cardiac Enzymes  Recent Labs Lab 04/25/17 0111 04/25/17 0713 04/25/17 1313  TROPONINI 0.13* 0.17* 0.14*   ------------------------------------------------------------------------------------------------------------------  RADIOLOGY:  Ct Head Wo Contrast  Result Date: 04/28/2017 CLINICAL DATA:  Acute encephalopathy. Status post cardiac arrest 4 days ago. EXAM: CT HEAD WITHOUT CONTRAST TECHNIQUE:  Contiguous axial images were obtained from the base of the skull through the vertex without intravenous contrast. COMPARISON:  04/19/2017. FINDINGS: Brain: No evidence of acute infarction, hemorrhage, hydrocephalus, extra-axial collection or mass lesion/mass effect. Vascular: No hyperdense vessel or unexpected calcification. Skull: Bilateral hyperostosis frontalis. Sinuses/Orbits: Right sphenoid sinus mucosal thickening/retention cysts. Unremarkable included orbits. Other: Previously noted bilateral trichilemmal scalp cysts. IMPRESSION: No acute abnormality. Electronically Signed   By: Claudie Revering M.D.   On: 04/28/2017 12:19     ASSESSMENT AND PLAN:   46 year old female with a history of TBI and diabetes who presents with cardiopulmonary arrest.   1. Cardiopulmonary arrest (V. fib/PA arrest): Unclear etiology possible hypokalemia contributing And new diagnoses ischemic cardiomyopathy EF 20-25%   2. Acute hypoxic respiratory failure requiring mechanical intubation with acute encephalopathy:   3. Cardiogenic shock with new diagnosis of ischemic cardiomyopathy EF 20-25%: 4. Diabetes:   5 Elevated troponin due to demand ischemia    6 Hypocalcemia with acidosis:  7. Hypokalemia:    Power of attorney has decided due to very poor prognosis patient be transitioned to comfort care  Add scopolamine  Spoke with power of attorney yesterday and she is agreeable to hospice home if that is an option.   Management plans discussed with nurising  CODE STATUS:  DNR  TOTAL  TIME TAKING CARE OF THIS PATIENT: 7 minutes.     POSSIBLE D/C ??, DEPENDING ON CLINICAL CONDITION.   [name] M.D on 04/30/2017 at 7:45 AM  Between 7am to 6pm - Pager - [phone] After 6pm go to [email or web address] - password [email or web address] [email or web address]  Office  [phone]  CC: Primary care physician; Remi Haggard, FNP  Note: This dictation was prepared with Dragon dictation along with smaller  phrase technology. Any transcriptional errors that result from this process are unintentional.

## 2017-04-30 DEATH — deceased

## 2017-05-01 DIAGNOSIS — R4020 Unspecified coma: Secondary | ICD-10-CM | POA: Diagnosis not present

## 2017-05-01 MED ORDER — ACETAMINOPHEN 325 MG PO TABS: 650.0000 mg | ORAL_TABLET | Freq: Four times a day (QID) | ORAL | Status: DC | PRN

## 2017-05-01 MED ORDER — MORPHINE SULFATE (CONCENTRATE) 10 MG/0.5ML PO SOLN
20.0000 mg | ORAL | Status: DC | PRN
  Administered 2017-05-01 (×2): 20 mg via ORAL
  Filled 2017-05-01 (×2): qty 1

## 2017-05-01 MED ORDER — LORAZEPAM 1 MG PO TABS: 1.0000 mg | ORAL_TABLET | ORAL | Status: DC | PRN

## 2017-05-01 MED ORDER — ATROPINE SULFATE 1 % OP SOLN
4.0000 [drp] | OPHTHALMIC | Status: DC | PRN
  Administered 2017-05-01 – 2017-05-03 (×3): 4 [drp] via SUBLINGUAL
  Filled 2017-05-01: qty 2

## 2017-05-01 MED ORDER — LORAZEPAM 2 MG/ML PO CONC: 1.0000 mg | ORAL | Status: DC | PRN

## 2017-05-01 MED ORDER — GLYCOPYRROLATE 0.2 MG/ML IJ SOLN
0.1000 mg | Freq: Once | INTRAMUSCULAR | Status: AC
  Administered 2017-05-01: 11:00:00 0.1 mg via SUBCUTANEOUS
  Filled 2017-05-01: qty 0.5

## 2017-05-01 MED ORDER — LORAZEPAM 2 MG/ML IJ SOLN: 1.0000 mg | INTRAMUSCULAR | Status: DC | PRN

## 2017-05-01 MED ORDER — ONDANSETRON 4 MG PO TBDP
4.0000 mg | ORAL_TABLET | Freq: Four times a day (QID) | ORAL | Status: DC | PRN
  Filled 2017-05-01: qty 1

## 2017-05-01 MED ORDER — ACETAMINOPHEN 650 MG RE SUPP: 650.0000 mg | Freq: Four times a day (QID) | RECTAL | Status: DC | PRN

## 2017-05-01 MED ORDER — POLYVINYL ALCOHOL 1.4 % OP SOLN
1.0000 [drp] | Freq: Four times a day (QID) | OPHTHALMIC | Status: DC | PRN
Start: 2017-05-01 — End: 2017-05-04
  Filled 2017-05-01: qty 15

## 2017-05-01 MED ORDER — ONDANSETRON HCL 4 MG/2ML IJ SOLN
4.0000 mg | Freq: Four times a day (QID) | INTRAMUSCULAR | Status: DC | PRN
Start: 2017-05-01 — End: 2017-05-04

## 2017-05-01 NOTE — Progress Notes (Signed)
[facility] at [place] NAME: [name]    MR#:  [id number]  DATE OF BIRTH:  06-19-1971  SUBJECTIVE:   Eyes open But not following instructions. Coarse breath sounds. rattling  REVIEW OF SYSTEMS:    Unable to obtain patient Due to current mental status  DRUG ALLERGIES:   Allergies  Allergen Reactions  . Peanut Oil Other (See Comments)    Face turns red  . Risperidone And Related Cough    VITALS:  Blood pressure (!) 59/48, pulse 77, temperature (!) 95.6 F (35.3 C), resp. rate 20, height 5\' 8"  (1.727 m), weight 116.6 kg (257 lb 0.9 oz), SpO2 (!) 77 %.  PHYSICAL EXAMINATION:  Laying in bed. In respiratory distress Bilateral coarse breath sounds. Nonverbal  LABORATORY PANEL:   CBC  Recent Labs Lab 04/27/17 0433  WBC 18.4*  HGB 11.7*  HCT 35.5  PLT 302   ------------------------------------------------------------------------------------------------------------------  Chemistries   Recent Labs Lab 04/25/17 0713  04/28/17 0403 04/28/17 1231  NA 134*  < > 137  --   K 3.0*  < > 3.3* 4.1  CL 103  < > 120*  --   CO2 22  < > 14*  --   GLUCOSE 126*  < > 91  --   BUN 5*  < > 6  --   CREATININE 1.14*  < > 0.86  --   CALCIUM 6.7*  < > 5.4* 6.8*  MG 1.9  < > 1.5* 2.2  AST 178*  --   --   --   ALT 62*  --   --   --   ALKPHOS 211*  --   --   --   BILITOT 0.9  --   --   --   < > = values in this interval not displayed. ------------------------------------------------------------------------------------------------------------------  Cardiac Enzymes  Recent Labs Lab 04/25/17 0111 04/25/17 0713 04/25/17 1313  TROPONINI 0.13* 0.17* 0.14*   ------------------------------------------------------------------------------------------------------------------  RADIOLOGY:  No results found.   ASSESSMENT AND PLAN:   46 year old female with a history of TBI and diabetes who presents with cardiopulmonary arrest.   1.  Cardiopulmonary arrest (V. fib/PA arrest): etiology possible hypokalemia contributing And new diagnoses ischemic cardiomyopathy EF 20-25% 2. Acute hypoxic respiratory failure requiring mechanical intubation with acute encephalopathy 3. Cardiogenic shock with new diagnosis of ischemic cardiomyopathy EF 20-25%: 4. Diabetes 5 Elevated troponin due to demand ischemia  6 Hypocalcemia with acidosis:  7. Hypokalemia 8. Hypoalbuminemia due to acute illness 9. Hypocalcemia 10. Hyperammonemia   Patient transition to comfort measures and transferred out of ICU as per her healthcare power of attorney's request. Due to poor prognosis.  We will add atropine drops and  Glycopyrrolate.  Increase morphine due to patient's worsening shortness of breath.   Management plans discussed with nursing  CODE STATUS:  DNR  TOTAL TIME TAKING CARE OF THIS PATIENT:25 minutes.  > 50% time spent in co-ordinating care   Hillary Bow R M.D on 05/01/2017 at 11:54 AM  Between 7am to 6pm - Pager - [phone] After 6pm go to [email or web address] - password [email or web address] [facility]  Office  [phone]  CC: Primary care physician; Remi Haggard, FNP  Note: This dictation was prepared with Dragon dictation along with smaller phrase technology. Any transcriptional errors that result from this process are unintentional.

## 2017-05-01 NOTE — Progress Notes (Signed)
Nutrition Brief Note  Chart reviewed. Patient now transitioning to comfort care.   No further nutrition interventions warranted at this time. Please consult RD as needed.   Leslee Haueter, MS, RD, LDN Pager: [phone] After Hours Pager: [phone]    

## 2017-05-01 NOTE — Care Management Important Message (Signed)
Important Message  Patient Details  Name: SALLEY BOXLEY MRN: [id number] Date of Birth: [birth year]   Medicare Important Message Given:  Yes    Shelbie Ammons, RN 05/01/2017, 8:42 AM

## 2017-05-02 MED ORDER — MORPHINE SULFATE (CONCENTRATE) 10 MG/0.5ML PO SOLN: 20.0000 mg | ORAL | Status: AC | PRN

## 2017-05-02 MED ORDER — ORAL CARE MOUTH RINSE
15.0000 mL | Freq: Two times a day (BID) | OROMUCOSAL | Status: DC
  Administered 2017-05-03: 15 mL via OROMUCOSAL

## 2017-05-02 MED ORDER — LORAZEPAM 2 MG/ML PO CONC: 1.0000 mg | ORAL | 0 refills | Status: AC | PRN

## 2017-05-02 NOTE — Progress Notes (Signed)
[facility] at [place] NAME: Zeah Germano    MR#:  [id number]  DATE OF BIRTH:  1971/10/05  SUBJECTIVE:   Eyes open . Seems comfortable  REVIEW OF SYSTEMS:    Unable to obtain patient Due to current mental status  DRUG ALLERGIES:   Allergies  Allergen Reactions  . Peanut Oil Other (See Comments)    Face turns red  . Risperidone And Related Cough    VITALS:  Blood pressure (!) 57/32, pulse 77, temperature (!) 95.6 F (35.3 C), resp. rate 14, height 5\' 8"  (1.727 m), weight 116.6 kg (257 lb 0.9 oz), SpO2 (!) 74 %.  PHYSICAL EXAMINATION:  Laying in bed. Bilateral coarse breath sounds. Nonverbal  LABORATORY PANEL:   CBC  Recent Labs Lab 04/27/17 0433  WBC 18.4*  HGB 11.7*  HCT 35.5  PLT 302   ------------------------------------------------------------------------------------------------------------------  Chemistries   Recent Labs Lab 04/28/17 0403 04/28/17 1231  NA 137  --   K 3.3* 4.1  CL 120*  --   CO2 14*  --   GLUCOSE 91  --   BUN 6  --   CREATININE 0.86  --   CALCIUM 5.4* 6.8*  MG 1.5* 2.2   ------------------------------------------------------------------------------------------------------------------  Cardiac Enzymes  Recent Labs Lab 04/25/17 1313  TROPONINI 0.14*   ------------------------------------------------------------------------------------------------------------------  RADIOLOGY:  No results found.   ASSESSMENT AND PLAN:   46 year old female with a history of TBI and diabetes who presents with cardiopulmonary arrest.  1. Cardiopulmonary arrest (V. fib/PA arrest): etiology possible hypokalemia contributing And new diagnoses ischemic cardiomyopathy EF 20-25% 2. Acute hypoxic respiratory failure requiring mechanical intubation with acute encephalopathy 3. Cardiogenic shock with new diagnosis of ischemic cardiomyopathy EF 20-25%: 4. Diabetes 5 Elevated troponin due to demand  ischemia  6 Hypocalcemia with acidosis:  7. Hypokalemia 8. Hypoalbuminemia due to acute illness 9. Hypocalcemia 10. Hyperammonemia 11. Comfort status  Continue comfort care. On morphine and Ativan as needed.  Management plans discussed with nursing  CODE STATUS:  DNR  TOTAL TIME TAKING CARE OF THIS PATIENT:15 minutes.   Hillary Bow R M.D on 05/02/2017 at 12:42 PM  Between 7am to 6pm - Pager - [phone]  After 6pm go to [email or web address] - password [email or web address] [facility]  Office  [phone]  CC: Primary care physician; Remi Haggard, FNP  Note: This dictation was prepared with Dragon dictation along with smaller phrase technology. Any transcriptional errors that result from this process are unintentional.

## 2017-05-04 NOTE — Progress Notes (Signed)
Pt pronounced dead at 2320 by said nurse and another RN.  MD and [facility] notified.  Family notified and given [facility] number for information on cremation, per [place] request.  CDS notified.  Pt is not a candidate for donor services.  No belongings at bedside to be returned.

## 2017-05-08 ENCOUNTER — Ambulatory Visit: Payer: Medicare Other | Admitting: Oncology

## 2017-05-31 NOTE — Plan of Care (Signed)
Problem: Education: Goal: Knowledge of the prescribed therapeutic regimen will improve Outcome: Not Progressing No Family in today   Problem: Coping: Goal: Ability to identify and develop effective coping behavior will improve Outcome: Not Progressing No Family in today    Problem: Physical Regulation: Goal: Complications related to the disease process, condition or treatment will be avoided or minimized Outcome: Not Progressing No Family in today    Problem: Respiratory: Goal: Verbalizations of increased ease of respirations will increase Outcome: Not Progressing No Family in today    Problem: Role Relationship: Goal: Familys ability to cope with current situation will improve Outcome: Not Progressing No Family in today. Spoke to family member on phone   Goal: Ability to verbalize concerns, feelings, and thoughts to partner or family member will improve Outcome: Not Progressing No Family in today   Goal: Ability to express an understanding of role expectations in relation to illness will improve Outcome: Not Progressing No Family in today    Problem: Pain Management: Goal: General experience of comfort will improve Outcome: Not Progressing No Family in today

## 2017-05-31 NOTE — Progress Notes (Signed)
[place] [facility] at [place] NAME: [name]    MR#:  [id number]  DATE OF BIRTH:  1971/07/11  SUBJECTIVE:   Eyes open . Seems comfortable  REVIEW OF SYSTEMS:    Unable to obtain patient Due to current mental status  DRUG ALLERGIES:   Allergies  Allergen Reactions  . Peanut Oil Other (See Comments)    Face turns red  . Risperidone And Related Cough    VITALS:  Blood pressure (!) 53/33, pulse 90, temperature 97.6 F (36.4 C), temperature source Oral, resp. rate (!) 23, height 5\' 8"  (1.727 m), weight 116.6 kg (257 lb 0.9 oz), SpO2 (!) 76 %.  PHYSICAL EXAMINATION:  Laying in bed. Bilateral coarse breath sounds. Nonverbal  LABORATORY PANEL:   CBC  Recent Labs Lab 04/27/17 0433  WBC 18.4*  HGB 11.7*  HCT 35.5  PLT 302   ------------------------------------------------------------------------------------------------------------------  Chemistries   Recent Labs Lab 04/28/17 0403 04/28/17 1231  NA 137  --   K 3.3* 4.1  CL 120*  --   CO2 14*  --   GLUCOSE 91  --   BUN 6  --   CREATININE 0.86  --   CALCIUM 5.4* 6.8*  MG 1.5* 2.2   ------------------------------------------------------------------------------------------------------------------  Cardiac Enzymes No results for input(s): TROPONINI in the last 168 hours. ------------------------------------------------------------------------------------------------------------------  RADIOLOGY:  No results found.   ASSESSMENT AND PLAN:   46 year old female with a history of TBI and diabetes who presents with cardiopulmonary arrest.  1. Cardiopulmonary arrest (V. fib/PA arrest): etiology possible hypokalemia contributing And new diagnoses ischemic cardiomyopathy EF 20-25% 2. Acute hypoxic respiratory failure requiring mechanical intubation with acute encephalopathy 3. Cardiogenic shock with new diagnosis of ischemic cardiomyopathy EF 20-25%: 4. Diabetes 5 Elevated  troponin due to demand ischemia  6 Hypocalcemia with acidosis:  7. Hypokalemia 8. Hypoalbuminemia due to acute illness 9. Hypocalcemia 10. Hyperammonemia 11. Comfort status  Continue comfort care. On morphine and Ativan as needed.  Management plans discussed with nursing  CODE STATUS:  DNR  TOTAL TIME TAKING CARE OF THIS PATIENT:15 minutes.   Marie Borowski M.D on 05-11-17 at 8:10 AM  Between 7am to 6pm - Pager - [phone]  After 6pm go to [email or web address] - password [email or web address] [facility]  Office  [phone]  CC: Primary care physician; Remi Haggard, FNP  Note: This dictation was prepared with Dragon dictation along with smaller phrase technology. Any transcriptional errors that result from this process are unintentional.

## 2017-05-31 DEATH — deceased

## 2017-06-01 NOTE — Discharge Summary (Signed)
  DEATH SUMMARY   Patient Details  Name: [name] MRN: [id number] DOB: 01-Oct-1971  Admission/Discharge Information   Admit Date:  May 24, 2017  Date of Death: Date of Death: 06/02/17  Time of Death: Time of Death: February 27, 2319  Length of Stay: 02-07-23  Referring Physician: Remi Haggard, FNP   Reason(s) for Hospitalization  Unresponsive at group home  Diagnoses  Preliminary cause of death:  Ventricular fibrillation/Cardiac arrest Cardiogenic shock with EF 20-25% Electrolyte abnormamility  Brief Hospital Course (including significant findings, care, treatment, and services provided and events leading to death)  [name] is a 46 y.o. year old female who had history of TBI and diabetes who presents with cardiopulmonary arrest.  1. Cardiopulmonary arrest (V. fib/PA arrest): etiology possible hypokalemia contributing And new diagnoses ischemic cardiomyopathy EF 20-25% 2. Acute hypoxic respiratory failure requiring mechanical intubation with acute encephalopathy 3. Cardiogenic shock with new diagnosis of ischemic cardiomyopathy EF 20-25%: 4. Diabetes 5 Elevated troponin due to demand ischemia  6 Hypocalcemia with acidosis:  7. Hypokalemia 8. Hypoalbuminemia due to acute illness 9. Hypocalcemia 10. Hyperammonemia 11. Comfort status decided by Legal guardian   Continued with  comfort care. On morphine and Ativan as needed. Died on 2017-06-02 at 02-27-2319    Pertinent Labs and Studies  Significant Diagnostic Studies No results found.  Microbiology No results found for this or any previous visit (from the past 240 hour(s)).  Lab Basic Metabolic Panel: No results for input(s): NA, K, CL, CO2, GLUCOSE, BUN, CREATININE, CALCIUM, MG, PHOS in the last 168 hours. Liver Function Tests: No results for input(s): AST, ALT, ALKPHOS, BILITOT, PROT, ALBUMIN in the last 168 hours. No results for input(s): LIPASE, AMYLASE in the last 168 hours. No results for input(s): AMMONIA in the last 168  hours. CBC: No results for input(s): WBC, NEUTROABS, HGB, HCT, MCV, PLT in the last 168 hours. Cardiac Enzymes: No results for input(s): CKTOTAL, CKMB, CKMBINDEX, TROPONINI in the last 168 hours. Sepsis Labs: No results for input(s): PROCALCITON, WBC, LATICACIDVEN in the last 168 hours.  Procedures/Operations     Jarad Barth 06/01/2017, 3:04 PM

## 2017-10-30 IMAGING — CT CT HEAD W/O CM
3 series · 16 of 47 positions shown, 19 images · non-contrast
Comparison: 04/24/2017.

CLINICAL DATA: Acute encephalopathy. Status post cardiac arrest 4
days ago.

EXAM:
CT HEAD WITHOUT CONTRAST
TECHNIQUE: Contiguous axial images were obtained from the base of the skull
through the vertex without intravenous contrast.

[Series 2: head wo · axial · 0.42mm/px · z∈[-119,+6]mm · 10 of 30 slices shown, 13 images]
[im 3/30  brain]
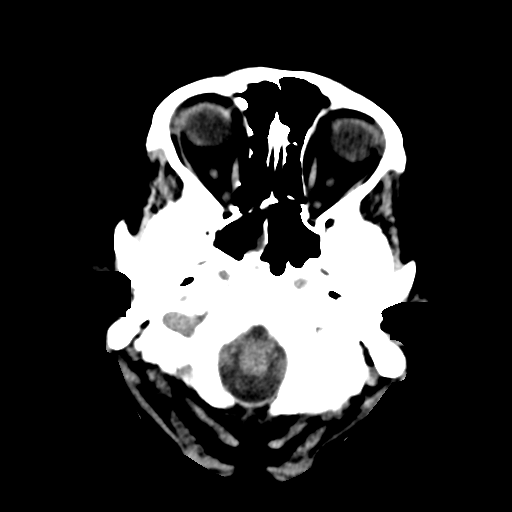
[im 3/30  bone]
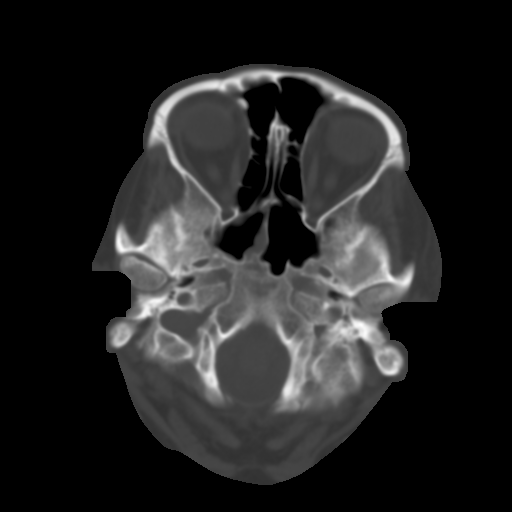
[im 6/30  brain]
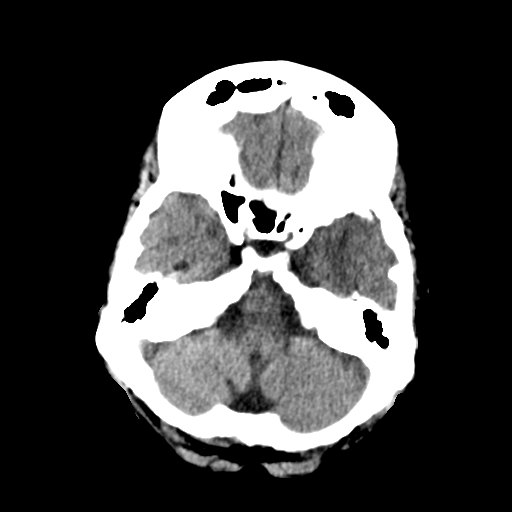
[im 9/30  brain]
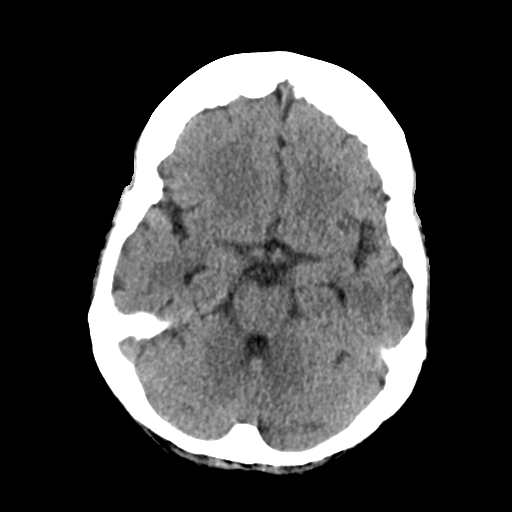
[im 11/30  brain]
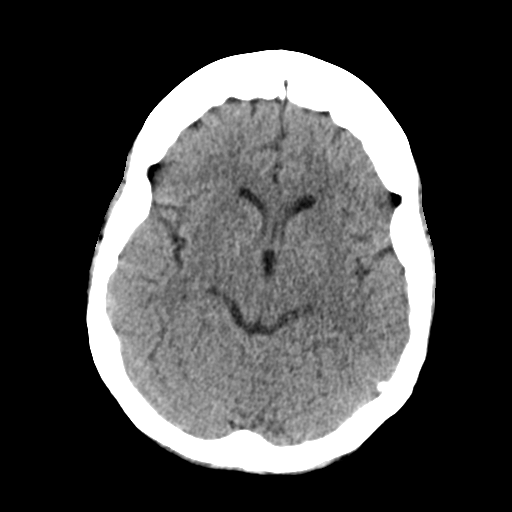
[im 14/30  brain]
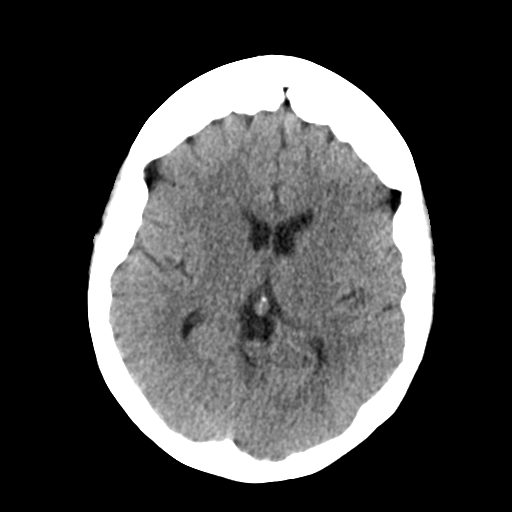
[im 14/30  bone]
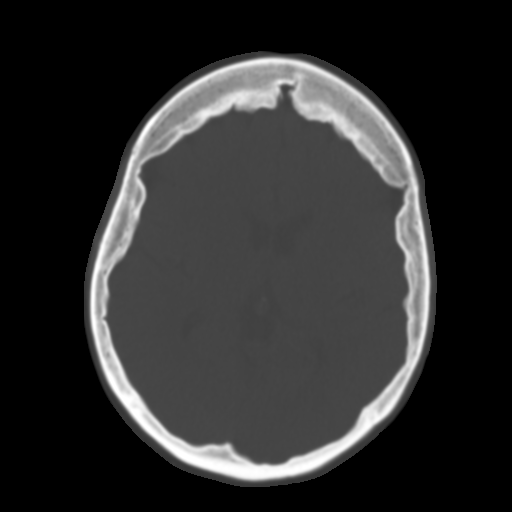
[im 17/30  brain]
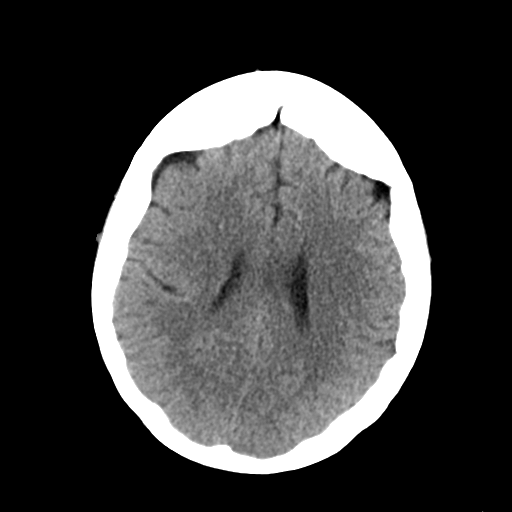
[im 20/30  brain]
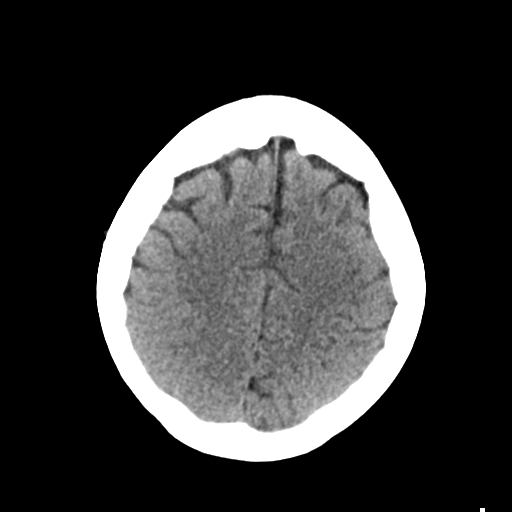
[im 23/30  brain]
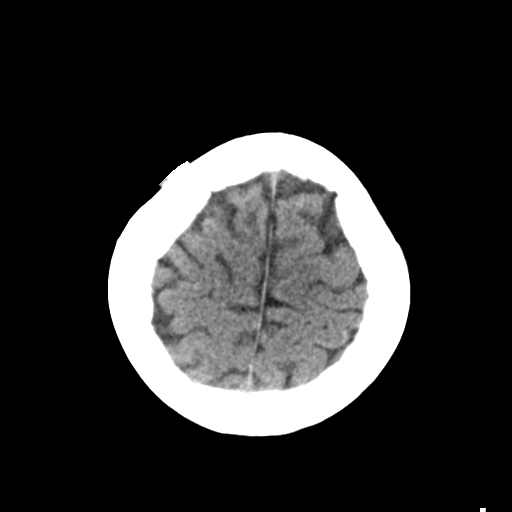
[im 25/30  brain]
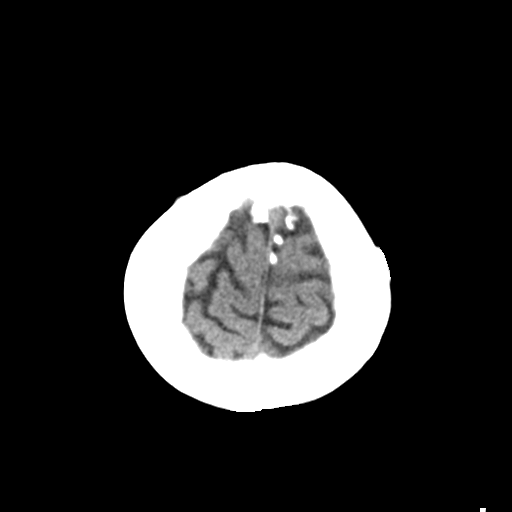
[im 25/30  bone]
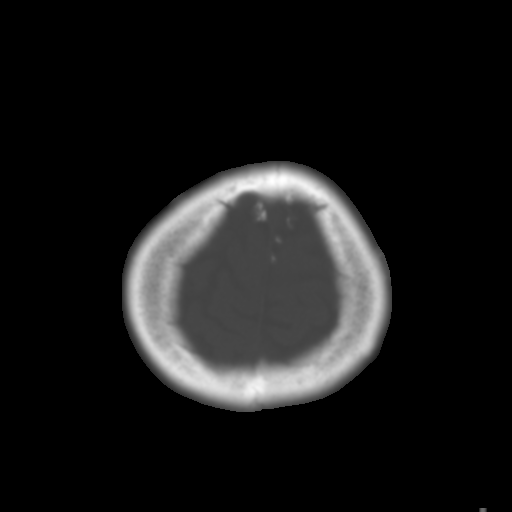
[im 28/30  brain]
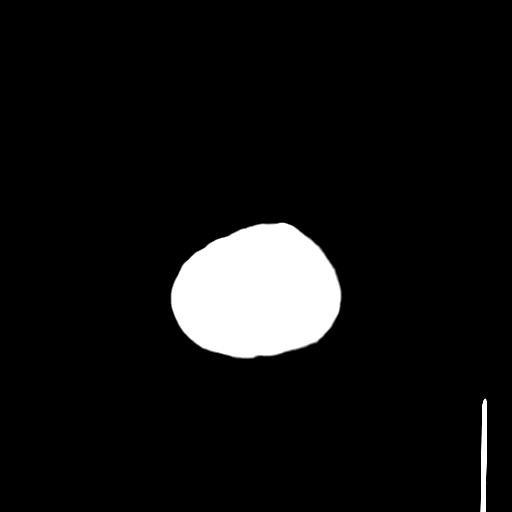

[Series 4: coronal soft tissue · coronal · 0.32mm/px · 3 of 63 slices shown]
[im 21/63  brain]
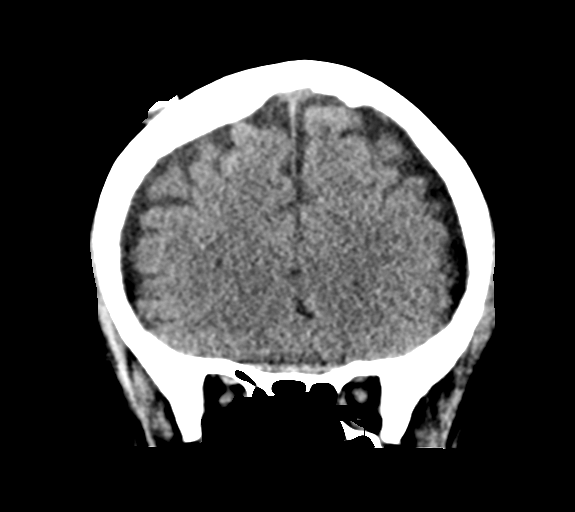
[im 28/63  brain]
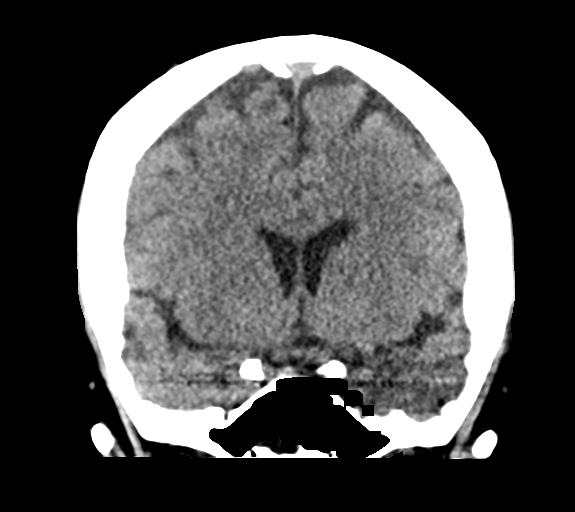
[im 35/63  brain]
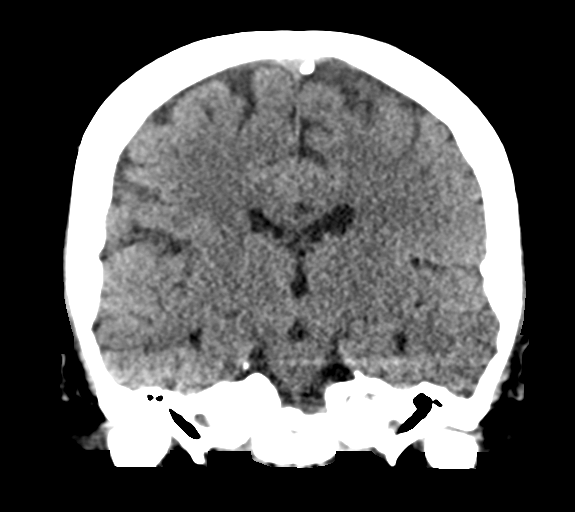

[Series 5: sagittal soft tissue · sagittal · 0.32mm/px · 3 of 51 slices shown]
[im 17/51  brain]
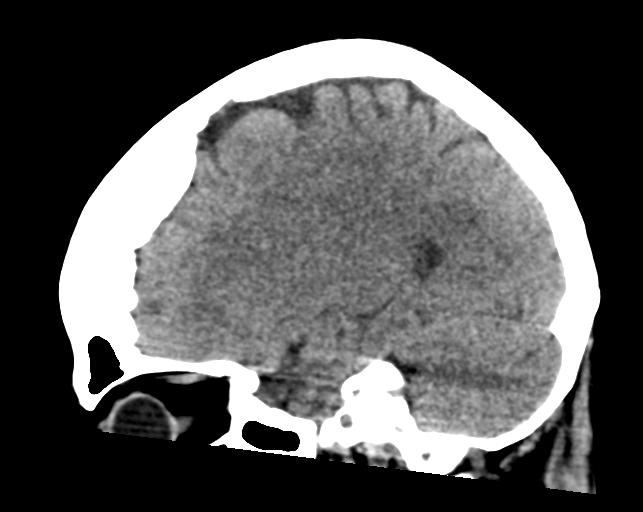
[im 26/51  brain]
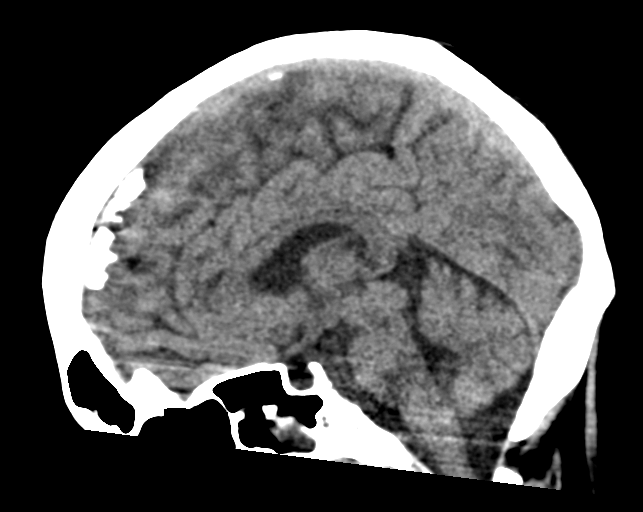
[im 34/51  brain]
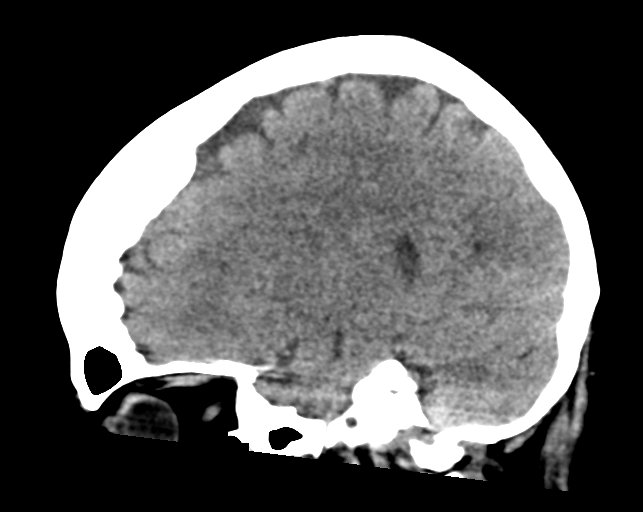

[16 of 47 positions shown; findings below may reference images not displayed]

FINDINGS: Brain: No evidence of acute infarction, hemorrhage, hydrocephalus,
extra-axial collection or mass lesion/mass effect.

Vascular: No hyperdense vessel or unexpected calcification.

Skull: Bilateral hyperostosis frontalis.

Sinuses/Orbits: Right sphenoid sinus mucosal thickening/retention
cysts. Unremarkable included orbits.

Other: Previously noted bilateral trichilemmal scalp cysts.
IMPRESSION: No acute abnormality.
